# Patient Record
Sex: Female | Born: 1950 | Race: Black or African American | Hispanic: No | Marital: Single | Smoking: Never smoker
Health system: Southern US, Community
[De-identification: ages and names within clinical notes are randomized; demographics above are authoritative.]

## PROBLEM LIST (undated history)

## (undated) ENCOUNTER — Emergency Department (HOSPITAL_COMMUNITY): Payer: Medicare Other | Source: Home / Self Care

## (undated) DIAGNOSIS — R350 Frequency of micturition: Secondary | ICD-10-CM

## (undated) DIAGNOSIS — M545 Low back pain, unspecified: Secondary | ICD-10-CM

## (undated) DIAGNOSIS — M2550 Pain in unspecified joint: Secondary | ICD-10-CM

## (undated) DIAGNOSIS — Z9221 Personal history of antineoplastic chemotherapy: Secondary | ICD-10-CM

## (undated) DIAGNOSIS — D649 Anemia, unspecified: Secondary | ICD-10-CM

## (undated) DIAGNOSIS — C73 Malignant neoplasm of thyroid gland: Secondary | ICD-10-CM

## (undated) DIAGNOSIS — I10 Essential (primary) hypertension: Secondary | ICD-10-CM

## (undated) DIAGNOSIS — R238 Other skin changes: Secondary | ICD-10-CM

## (undated) DIAGNOSIS — C50911 Malignant neoplasm of unspecified site of right female breast: Secondary | ICD-10-CM

## (undated) DIAGNOSIS — Z8709 Personal history of other diseases of the respiratory system: Secondary | ICD-10-CM

## (undated) DIAGNOSIS — Z1379 Encounter for other screening for genetic and chromosomal anomalies: Secondary | ICD-10-CM

## (undated) DIAGNOSIS — M1990 Unspecified osteoarthritis, unspecified site: Secondary | ICD-10-CM

## (undated) DIAGNOSIS — J189 Pneumonia, unspecified organism: Secondary | ICD-10-CM

## (undated) DIAGNOSIS — Z923 Personal history of irradiation: Secondary | ICD-10-CM

## (undated) DIAGNOSIS — G8929 Other chronic pain: Secondary | ICD-10-CM

## (undated) DIAGNOSIS — D0512 Intraductal carcinoma in situ of left breast: Secondary | ICD-10-CM

## (undated) DIAGNOSIS — M778 Other enthesopathies, not elsewhere classified: Secondary | ICD-10-CM

## (undated) DIAGNOSIS — E039 Hypothyroidism, unspecified: Secondary | ICD-10-CM

## (undated) DIAGNOSIS — M2540 Effusion, unspecified joint: Secondary | ICD-10-CM

## (undated) DIAGNOSIS — R233 Spontaneous ecchymoses: Secondary | ICD-10-CM

## (undated) DIAGNOSIS — G4730 Sleep apnea, unspecified: Secondary | ICD-10-CM

## (undated) HISTORY — DX: Encounter for other screening for genetic and chromosomal anomalies: Z13.79

## (undated) HISTORY — PX: JOINT REPLACEMENT: SHX530

## (undated) HISTORY — PX: COLONOSCOPY: SHX174

## (undated) HISTORY — DX: Malignant neoplasm of thyroid gland: C73

---

## 1997-12-11 ENCOUNTER — Other Ambulatory Visit: Admission: RE | Admit: 1997-12-11 | Discharge: 1997-12-11 | Payer: Self-pay | Admitting: Obstetrics and Gynecology

## 1998-09-10 ENCOUNTER — Encounter: Payer: Self-pay | Admitting: Oral and Maxillofacial Surgery

## 1998-09-12 ENCOUNTER — Observation Stay (HOSPITAL_COMMUNITY): Admission: RE | Admit: 1998-09-12 | Discharge: 1998-09-13 | Payer: Self-pay | Admitting: Oral and Maxillofacial Surgery

## 1999-05-21 ENCOUNTER — Other Ambulatory Visit: Admission: RE | Admit: 1999-05-21 | Discharge: 1999-05-21 | Payer: Self-pay | Admitting: Family Medicine

## 1999-07-03 ENCOUNTER — Other Ambulatory Visit: Admission: RE | Admit: 1999-07-03 | Discharge: 1999-07-03 | Payer: Self-pay | Admitting: Family Medicine

## 2001-10-06 ENCOUNTER — Encounter: Payer: Self-pay | Admitting: Family Medicine

## 2001-10-06 ENCOUNTER — Ambulatory Visit (HOSPITAL_COMMUNITY): Admission: RE | Admit: 2001-10-06 | Discharge: 2001-10-06 | Payer: Self-pay | Admitting: Family Medicine

## 2001-11-15 ENCOUNTER — Encounter: Payer: Self-pay | Admitting: Obstetrics

## 2001-11-15 ENCOUNTER — Ambulatory Visit (HOSPITAL_COMMUNITY): Admission: RE | Admit: 2001-11-15 | Discharge: 2001-11-15 | Payer: Self-pay | Admitting: Obstetrics

## 2002-04-28 DIAGNOSIS — C50911 Malignant neoplasm of unspecified site of right female breast: Secondary | ICD-10-CM

## 2002-04-28 HISTORY — PX: BREAST LUMPECTOMY: SHX2

## 2002-04-28 HISTORY — PX: PORTACATH PLACEMENT: SHX2246

## 2002-04-28 HISTORY — DX: Malignant neoplasm of unspecified site of right female breast: C50.911

## 2003-02-28 ENCOUNTER — Encounter: Admission: RE | Admit: 2003-02-28 | Discharge: 2003-02-28 | Payer: Self-pay | Admitting: Family Medicine

## 2003-02-28 ENCOUNTER — Encounter (INDEPENDENT_AMBULATORY_CARE_PROVIDER_SITE_OTHER): Payer: Self-pay | Admitting: Diagnostic Radiology

## 2003-02-28 ENCOUNTER — Encounter (INDEPENDENT_AMBULATORY_CARE_PROVIDER_SITE_OTHER): Payer: Self-pay | Admitting: *Deleted

## 2003-02-28 HISTORY — PX: BREAST BIOPSY: SHX20

## 2003-03-13 ENCOUNTER — Encounter (HOSPITAL_COMMUNITY): Admission: RE | Admit: 2003-03-13 | Discharge: 2003-06-11 | Payer: Self-pay | Admitting: Internal Medicine

## 2003-03-17 ENCOUNTER — Encounter (INDEPENDENT_AMBULATORY_CARE_PROVIDER_SITE_OTHER): Payer: Self-pay | Admitting: Specialist

## 2003-03-17 ENCOUNTER — Ambulatory Visit (HOSPITAL_COMMUNITY): Admission: RE | Admit: 2003-03-17 | Discharge: 2003-03-17 | Payer: Self-pay | Admitting: General Surgery

## 2003-04-18 ENCOUNTER — Ambulatory Visit: Admission: RE | Admit: 2003-04-18 | Discharge: 2003-04-18 | Payer: Self-pay | Admitting: Oncology

## 2003-04-18 ENCOUNTER — Encounter (INDEPENDENT_AMBULATORY_CARE_PROVIDER_SITE_OTHER): Payer: Self-pay | Admitting: Cardiology

## 2003-04-24 ENCOUNTER — Ambulatory Visit (HOSPITAL_COMMUNITY): Admission: RE | Admit: 2003-04-24 | Discharge: 2003-04-24 | Payer: Self-pay | Admitting: Oncology

## 2003-04-27 ENCOUNTER — Ambulatory Visit (HOSPITAL_COMMUNITY): Admission: RE | Admit: 2003-04-27 | Discharge: 2003-04-27 | Payer: Self-pay | Admitting: General Surgery

## 2003-04-29 DIAGNOSIS — Z923 Personal history of irradiation: Secondary | ICD-10-CM

## 2003-04-29 DIAGNOSIS — Z9221 Personal history of antineoplastic chemotherapy: Secondary | ICD-10-CM

## 2003-04-29 HISTORY — PX: PORTA CATH REMOVAL: CATH118286

## 2003-04-29 HISTORY — DX: Personal history of irradiation: Z92.3

## 2003-04-29 HISTORY — DX: Personal history of antineoplastic chemotherapy: Z92.21

## 2003-05-04 ENCOUNTER — Ambulatory Visit (HOSPITAL_COMMUNITY): Admission: RE | Admit: 2003-05-04 | Discharge: 2003-05-04 | Payer: Self-pay | Admitting: Oncology

## 2003-07-24 ENCOUNTER — Ambulatory Visit (HOSPITAL_COMMUNITY): Admission: RE | Admit: 2003-07-24 | Discharge: 2003-07-24 | Payer: Self-pay | Admitting: Oncology

## 2003-08-07 ENCOUNTER — Ambulatory Visit (HOSPITAL_COMMUNITY): Admission: RE | Admit: 2003-08-07 | Discharge: 2003-08-07 | Payer: Self-pay | Admitting: Oncology

## 2003-08-16 ENCOUNTER — Ambulatory Visit: Admission: RE | Admit: 2003-08-16 | Discharge: 2003-10-27 | Payer: Self-pay | Admitting: Radiation Oncology

## 2003-08-21 ENCOUNTER — Encounter: Admission: RE | Admit: 2003-08-21 | Discharge: 2003-08-21 | Payer: Self-pay | Admitting: Radiation Oncology

## 2003-09-21 ENCOUNTER — Ambulatory Visit (HOSPITAL_BASED_OUTPATIENT_CLINIC_OR_DEPARTMENT_OTHER): Admission: RE | Admit: 2003-09-21 | Discharge: 2003-09-21 | Payer: Self-pay | Admitting: General Surgery

## 2003-09-21 ENCOUNTER — Ambulatory Visit (HOSPITAL_COMMUNITY): Admission: RE | Admit: 2003-09-21 | Discharge: 2003-09-21 | Payer: Self-pay | Admitting: General Surgery

## 2003-11-23 ENCOUNTER — Ambulatory Visit: Admission: RE | Admit: 2003-11-23 | Discharge: 2003-11-23 | Payer: Self-pay | Admitting: Radiation Oncology

## 2004-04-02 ENCOUNTER — Encounter: Admission: RE | Admit: 2004-04-02 | Discharge: 2004-04-02 | Payer: Self-pay | Admitting: Oncology

## 2004-05-03 ENCOUNTER — Ambulatory Visit: Payer: Self-pay | Admitting: Oncology

## 2004-05-23 ENCOUNTER — Ambulatory Visit: Admission: RE | Admit: 2004-05-23 | Discharge: 2004-05-23 | Payer: Self-pay | Admitting: Radiation Oncology

## 2004-05-29 ENCOUNTER — Ambulatory Visit: Admission: RE | Admit: 2004-05-29 | Discharge: 2004-08-27 | Payer: Self-pay | Admitting: Radiation Oncology

## 2004-12-09 ENCOUNTER — Ambulatory Visit: Payer: Self-pay | Admitting: Oncology

## 2005-04-28 HISTORY — PX: KNEE ARTHROSCOPY: SHX127

## 2005-04-28 HISTORY — PX: APPENDECTOMY: SHX54

## 2005-04-29 ENCOUNTER — Inpatient Hospital Stay (HOSPITAL_COMMUNITY): Admission: EM | Admit: 2005-04-29 | Discharge: 2005-05-06 | Payer: Self-pay | Admitting: Emergency Medicine

## 2005-04-29 ENCOUNTER — Encounter (INDEPENDENT_AMBULATORY_CARE_PROVIDER_SITE_OTHER): Payer: Self-pay | Admitting: Specialist

## 2005-05-21 ENCOUNTER — Encounter: Admission: RE | Admit: 2005-05-21 | Discharge: 2005-05-21 | Payer: Self-pay | Admitting: Surgery

## 2005-06-30 ENCOUNTER — Encounter: Admission: RE | Admit: 2005-06-30 | Discharge: 2005-06-30 | Payer: Self-pay | Admitting: Oncology

## 2005-07-14 ENCOUNTER — Encounter: Admission: RE | Admit: 2005-07-14 | Discharge: 2005-07-14 | Payer: Self-pay | Admitting: Oncology

## 2005-07-21 ENCOUNTER — Encounter: Admission: RE | Admit: 2005-07-21 | Discharge: 2005-07-21 | Payer: Self-pay | Admitting: Oncology

## 2005-09-12 ENCOUNTER — Ambulatory Visit (HOSPITAL_COMMUNITY): Admission: RE | Admit: 2005-09-12 | Discharge: 2005-09-12 | Payer: Self-pay | Admitting: Orthopedic Surgery

## 2005-10-01 ENCOUNTER — Encounter: Admission: RE | Admit: 2005-10-01 | Discharge: 2005-10-16 | Payer: Self-pay | Admitting: Orthopedic Surgery

## 2005-10-31 ENCOUNTER — Ambulatory Visit: Payer: Self-pay | Admitting: Oncology

## 2005-11-07 LAB — CBC WITH DIFFERENTIAL/PLATELET
Eosinophils Absolute: 0.1 10*3/uL (ref 0.0–0.5)
MONO#: 0.3 10*3/uL (ref 0.1–0.9)
NEUT#: 2.7 10*3/uL (ref 1.5–6.5)
RBC: 4.1 10*6/uL (ref 3.70–5.32)
RDW: 12.6 % (ref 11.3–14.5)
WBC: 4.8 10*3/uL (ref 3.9–10.0)

## 2005-11-17 LAB — COMPREHENSIVE METABOLIC PANEL
ALT: 17 U/L (ref 0–40)
AST: 16 U/L (ref 0–37)
CO2: 28 mEq/L (ref 19–32)
Calcium: 8.8 mg/dL (ref 8.4–10.5)
Chloride: 107 mEq/L (ref 96–112)
Sodium: 143 mEq/L (ref 135–145)
Total Bilirubin: 0.3 mg/dL (ref 0.3–1.2)
Total Protein: 6.7 g/dL (ref 6.0–8.3)

## 2005-11-17 LAB — ESTRADIOL, ULTRA SENS: Estradiol, Ultra Sensitive: 25 pg/mL

## 2005-11-17 LAB — CANCER ANTIGEN 27.29: CA 27.29: 16 U/mL (ref 0–39)

## 2006-01-22 ENCOUNTER — Encounter: Admission: RE | Admit: 2006-01-22 | Discharge: 2006-01-22 | Payer: Self-pay | Admitting: Oncology

## 2006-04-28 HISTORY — PX: ANTERIOR CERVICAL DECOMP/DISCECTOMY FUSION: SHX1161

## 2006-05-05 ENCOUNTER — Ambulatory Visit: Payer: Self-pay | Admitting: Oncology

## 2006-05-07 LAB — CBC WITH DIFFERENTIAL/PLATELET
Basophils Absolute: 0 10*3/uL (ref 0.0–0.1)
EOS%: 1.5 % (ref 0.0–7.0)
HGB: 12.6 g/dL (ref 11.6–15.9)
LYMPH%: 38.4 % (ref 14.0–48.0)
MCH: 29.8 pg (ref 26.0–34.0)
MCV: 88.2 fL (ref 81.0–101.0)
MONO%: 7 % (ref 0.0–13.0)
Platelets: 279 10*3/uL (ref 145–400)
RDW: 12.3 % (ref 11.3–14.5)

## 2006-05-07 LAB — COMPREHENSIVE METABOLIC PANEL
AST: 17 U/L (ref 0–37)
Alkaline Phosphatase: 52 U/L (ref 39–117)
BUN: 18 mg/dL (ref 6–23)
Creatinine, Ser: 0.96 mg/dL (ref 0.40–1.20)
Glucose, Bld: 102 mg/dL — ABNORMAL HIGH (ref 70–99)
Potassium: 3.5 mEq/L (ref 3.5–5.3)
Total Bilirubin: 0.3 mg/dL (ref 0.3–1.2)

## 2006-05-07 LAB — FOLLICLE STIMULATING HORMONE: FSH: 11.8 m[IU]/mL

## 2006-07-31 LAB — ESTRADIOL, ULTRA SENS

## 2006-08-10 ENCOUNTER — Encounter: Admission: RE | Admit: 2006-08-10 | Discharge: 2006-08-10 | Payer: Self-pay | Admitting: Oncology

## 2006-11-13 ENCOUNTER — Ambulatory Visit: Payer: Self-pay | Admitting: Oncology

## 2006-11-18 LAB — FOLLICLE STIMULATING HORMONE: FSH: 11.7 m[IU]/mL

## 2006-11-18 LAB — COMPREHENSIVE METABOLIC PANEL
ALT: 23 U/L (ref 0–35)
AST: 21 U/L (ref 0–37)
Albumin: 3.9 g/dL (ref 3.5–5.2)
BUN: 15 mg/dL (ref 6–23)
CO2: 26 mEq/L (ref 19–32)
Calcium: 8.8 mg/dL (ref 8.4–10.5)
Chloride: 110 mEq/L (ref 96–112)
Creatinine, Ser: 0.95 mg/dL (ref 0.40–1.20)
Potassium: 3.8 mEq/L (ref 3.5–5.3)

## 2006-11-18 LAB — CANCER ANTIGEN 27.29: CA 27.29: 16 U/mL (ref 0–39)

## 2006-11-18 LAB — CBC WITH DIFFERENTIAL/PLATELET
Basophils Absolute: 0 10*3/uL (ref 0.0–0.1)
EOS%: 2 % (ref 0.0–7.0)
HCT: 35.5 % (ref 34.8–46.6)
HGB: 12.2 g/dL (ref 11.6–15.9)
MONO#: 0.4 10*3/uL (ref 0.1–0.9)
NEUT#: 2.3 10*3/uL (ref 1.5–6.5)
NEUT%: 45.3 % (ref 39.6–76.8)
RDW: 12.3 % (ref 11.3–14.5)
WBC: 5.1 10*3/uL (ref 3.9–10.0)
lymph#: 2.3 10*3/uL (ref 0.9–3.3)

## 2006-11-30 LAB — ESTRADIOL, ULTRA SENS: Estradiol, Ultra Sensitive: <2 pg/mL

## 2007-01-04 ENCOUNTER — Inpatient Hospital Stay (HOSPITAL_COMMUNITY): Admission: RE | Admit: 2007-01-04 | Discharge: 2007-01-05 | Payer: Self-pay | Admitting: Neurosurgery

## 2007-05-19 ENCOUNTER — Ambulatory Visit: Payer: Self-pay | Admitting: Oncology

## 2007-05-21 ENCOUNTER — Encounter: Admission: RE | Admit: 2007-05-21 | Discharge: 2007-05-21 | Payer: Self-pay | Admitting: Oncology

## 2007-05-21 LAB — CBC WITH DIFFERENTIAL/PLATELET
BASO%: 0 % (ref 0.0–2.0)
EOS%: 1.5 % (ref 0.0–7.0)
HCT: 38 % (ref 34.8–46.6)
LYMPH%: 39.8 % (ref 14.0–48.0)
MCH: 28.6 pg (ref 26.0–34.0)
MCHC: 33.4 g/dL (ref 32.0–36.0)
MONO#: 0.4 10*3/uL (ref 0.1–0.9)
NEUT%: 51.7 % (ref 39.6–76.8)
Platelets: 308 10*3/uL (ref 145–400)
RBC: 4.43 10*6/uL (ref 3.70–5.32)
WBC: 5.6 10*3/uL (ref 3.9–10.0)

## 2007-05-21 LAB — COMPREHENSIVE METABOLIC PANEL
ALT: 19 U/L (ref 0–35)
AST: 17 U/L (ref 0–37)
Alkaline Phosphatase: 56 U/L (ref 39–117)
Creatinine, Ser: 1.05 mg/dL (ref 0.40–1.20)
Sodium: 141 mEq/L (ref 135–145)
Total Bilirubin: 0.4 mg/dL (ref 0.3–1.2)
Total Protein: 7.2 g/dL (ref 6.0–8.3)

## 2007-05-21 LAB — CANCER ANTIGEN 27.29: CA 27.29: 18 U/mL (ref 0–39)

## 2007-06-03 LAB — ESTRADIOL, ULTRA SENS: Estradiol, Ultra Sensitive: <2 pg/mL

## 2007-08-20 ENCOUNTER — Encounter: Admission: RE | Admit: 2007-08-20 | Discharge: 2007-08-20 | Payer: Self-pay | Admitting: Oncology

## 2007-11-16 ENCOUNTER — Ambulatory Visit: Payer: Self-pay | Admitting: Oncology

## 2007-11-19 LAB — COMPREHENSIVE METABOLIC PANEL
Alkaline Phosphatase: 54 U/L (ref 39–117)
BUN: 16 mg/dL (ref 6–23)
Glucose, Bld: 92 mg/dL (ref 70–99)
Sodium: 139 mEq/L (ref 135–145)
Total Bilirubin: 0.3 mg/dL (ref 0.3–1.2)

## 2007-11-19 LAB — CBC WITH DIFFERENTIAL/PLATELET
Basophils Absolute: 0 10*3/uL (ref 0.0–0.1)
Eosinophils Absolute: 0.1 10*3/uL (ref 0.0–0.5)
LYMPH%: 42.7 % (ref 14.0–48.0)
MCV: 86.2 fL (ref 81.0–101.0)
MONO%: 6.2 % (ref 0.0–13.0)
NEUT#: 3.5 10*3/uL (ref 1.5–6.5)
Platelets: 294 10*3/uL (ref 145–400)
RBC: 3.98 10*6/uL (ref 3.70–5.32)

## 2007-11-19 LAB — FOLLICLE STIMULATING HORMONE: FSH: 11.9 m[IU]/mL

## 2007-11-30 LAB — ESTRADIOL, ULTRA SENS

## 2008-05-17 ENCOUNTER — Ambulatory Visit: Payer: Self-pay | Admitting: Oncology

## 2008-09-06 ENCOUNTER — Encounter: Admission: RE | Admit: 2008-09-06 | Discharge: 2008-09-06 | Payer: Self-pay | Admitting: Oncology

## 2008-11-13 ENCOUNTER — Ambulatory Visit: Payer: Self-pay | Admitting: Oncology

## 2008-11-15 LAB — CBC WITH DIFFERENTIAL/PLATELET
BASO%: 0.6 % (ref 0.0–2.0)
EOS%: 1.4 % (ref 0.0–7.0)
MCH: 30 pg (ref 25.1–34.0)
MCHC: 33.9 g/dL (ref 31.5–36.0)
MCV: 88.5 fL (ref 79.5–101.0)
MONO%: 6.4 % (ref 0.0–14.0)
RBC: 3.96 10*6/uL (ref 3.70–5.45)
RDW: 12.7 % (ref 11.2–14.5)

## 2008-11-15 LAB — COMPREHENSIVE METABOLIC PANEL
ALT: 26 U/L (ref 0–35)
AST: 15 U/L (ref 0–37)
Albumin: 3.7 g/dL (ref 3.5–5.2)
Alkaline Phosphatase: 46 U/L (ref 39–117)
BUN: 15 mg/dL (ref 6–23)
Potassium: 3.5 mEq/L (ref 3.5–5.3)
Sodium: 141 mEq/L (ref 135–145)
Total Protein: 6.7 g/dL (ref 6.0–8.3)

## 2009-05-28 ENCOUNTER — Ambulatory Visit: Payer: Self-pay | Admitting: Oncology

## 2009-05-30 LAB — CBC WITH DIFFERENTIAL/PLATELET
Basophils Absolute: 0 10*3/uL (ref 0.0–0.1)
EOS%: 1.1 % (ref 0.0–7.0)
HCT: 36.8 % (ref 34.8–46.6)
LYMPH%: 45.5 % (ref 14.0–49.7)
MCH: 30 pg (ref 25.1–34.0)
MCHC: 33.2 g/dL (ref 31.5–36.0)
MONO#: 0.3 10*3/uL (ref 0.1–0.9)
Platelets: 273 10*3/uL (ref 145–400)
RBC: 4.08 10*6/uL (ref 3.70–5.45)
WBC: 5.5 10*3/uL (ref 3.9–10.3)

## 2009-05-30 LAB — COMPREHENSIVE METABOLIC PANEL
AST: 21 U/L (ref 0–37)
Albumin: 4 g/dL (ref 3.5–5.2)
BUN: 18 mg/dL (ref 6–23)
CO2: 25 mEq/L (ref 19–32)
Chloride: 104 mEq/L (ref 96–112)
Creatinine, Ser: 0.83 mg/dL (ref 0.40–1.20)
Glucose, Bld: 106 mg/dL — ABNORMAL HIGH (ref 70–99)
Sodium: 141 mEq/L (ref 135–145)

## 2009-05-31 ENCOUNTER — Ambulatory Visit (HOSPITAL_COMMUNITY): Admission: RE | Admit: 2009-05-31 | Discharge: 2009-05-31 | Payer: Self-pay | Admitting: Oncology

## 2009-05-31 ENCOUNTER — Ambulatory Visit: Payer: Self-pay | Admitting: Internal Medicine

## 2009-05-31 ENCOUNTER — Ambulatory Visit: Payer: Self-pay

## 2009-09-11 ENCOUNTER — Encounter: Admission: RE | Admit: 2009-09-11 | Discharge: 2009-09-11 | Payer: Self-pay | Admitting: Oncology

## 2009-10-10 ENCOUNTER — Encounter: Admission: RE | Admit: 2009-10-10 | Discharge: 2009-10-10 | Payer: Self-pay | Admitting: Family Medicine

## 2010-01-16 ENCOUNTER — Encounter: Admission: RE | Admit: 2010-01-16 | Discharge: 2010-01-24 | Payer: Self-pay | Admitting: Family Medicine

## 2010-04-28 DIAGNOSIS — C73 Malignant neoplasm of thyroid gland: Secondary | ICD-10-CM

## 2010-04-28 HISTORY — DX: Malignant neoplasm of thyroid gland: C73

## 2010-05-18 ENCOUNTER — Encounter: Payer: Self-pay | Admitting: Oncology

## 2010-05-19 ENCOUNTER — Encounter: Payer: Self-pay | Admitting: Oncology

## 2010-06-04 ENCOUNTER — Other Ambulatory Visit: Payer: Self-pay | Admitting: Physician Assistant

## 2010-06-04 ENCOUNTER — Encounter (HOSPITAL_BASED_OUTPATIENT_CLINIC_OR_DEPARTMENT_OTHER): Payer: 59 | Admitting: Oncology

## 2010-06-04 DIAGNOSIS — Z170 Estrogen receptor positive status [ER+]: Secondary | ICD-10-CM

## 2010-06-04 DIAGNOSIS — C50219 Malignant neoplasm of upper-inner quadrant of unspecified female breast: Secondary | ICD-10-CM

## 2010-06-04 LAB — COMPREHENSIVE METABOLIC PANEL
BUN: 18 mg/dL (ref 6–23)
CO2: 28 mEq/L (ref 19–32)
Chloride: 102 mEq/L (ref 96–112)
Potassium: 3.3 mEq/L — ABNORMAL LOW (ref 3.5–5.3)
Total Bilirubin: 0.4 mg/dL (ref 0.3–1.2)

## 2010-06-04 LAB — CBC WITH DIFFERENTIAL/PLATELET
Basophils Absolute: 0 10*3/uL (ref 0.0–0.1)
LYMPH%: 36.8 % (ref 14.0–49.7)
MCH: 30.1 pg (ref 25.1–34.0)
MCHC: 34.1 g/dL (ref 31.5–36.0)
MCV: 88.4 fL (ref 79.5–101.0)
MONO#: 0.3 10*3/uL (ref 0.1–0.9)
NEUT%: 55.9 % (ref 38.4–76.8)
RBC: 4.05 10*6/uL (ref 3.70–5.45)

## 2010-06-04 LAB — CANCER ANTIGEN 27.29: CA 27.29: 19 U/mL (ref 0–39)

## 2010-06-12 ENCOUNTER — Encounter (HOSPITAL_BASED_OUTPATIENT_CLINIC_OR_DEPARTMENT_OTHER): Payer: 59 | Admitting: Oncology

## 2010-06-12 ENCOUNTER — Other Ambulatory Visit: Payer: Self-pay | Admitting: Oncology

## 2010-06-12 DIAGNOSIS — Z170 Estrogen receptor positive status [ER+]: Secondary | ICD-10-CM

## 2010-06-12 DIAGNOSIS — C50219 Malignant neoplasm of upper-inner quadrant of unspecified female breast: Secondary | ICD-10-CM

## 2010-06-12 DIAGNOSIS — Z9889 Other specified postprocedural states: Secondary | ICD-10-CM

## 2010-09-10 NOTE — Op Note (Signed)
[name], [name] NO.:  [id number]   MEDICAL RECORD NO.:  [id number]          PATIENT TYPE:  INP   LOCATION:  2861                         FACILITY:  [facility]   PHYSICIAN:  Hewitt Shorts, M.D.DATE OF BIRTH:  June 28, 1950   DATE OF PROCEDURE:  01/04/2007  DATE OF DISCHARGE:                               OPERATIVE REPORT   PREOPERATIVE DIAGNOSIS:  Cervical disc herniation, cervical spondylosis,  cervical degenerative disc disease and cervical radiculopathy.   POSTOPERATIVE DIAGNOSIS:  Cervical disc herniation, cervical  spondylosis, cervical degenerative disc disease and cervical  radiculopathy.   PROCEDURE:  C4-5, C5-6 and C6-7 anterior cervical decompression and  arthrodesis with allograft and tethered cervical plating.   SURGEON:  Hewitt Shorts, M.D.   ASSISTANTS:  Nelia Shi. Webb Silversmith, RN and Coletta Memos, M.D.   ANESTHESIA:  General endotracheal.   INDICATIONS:  A 60 year old woman who presented with neck and radicular  pain, found by x-ray and MRI scan to have advanced degenerative disc  disease and spondylosis with superimposed disc herniation.  A decision  was made to proceed with multilevel anterior cervical diskectomy and  arthrodesis.   DESCRIPTION OF PROCEDURE:  The patient was brought to the operating room  and placed under general endotracheal anesthesia.  The patient was  placed in 10 pounds of halter traction, and the neck was prepped with  Betadine soap solution and draped in a sterile fashion.  An incision was  made on the left side of the neck paralleling the anterior border of the  left sternocleidomastoid.  The line of the incision was infiltrated with  local anesthetic with epinephrine.  Bipolar cautery was used to maintain  hemostasis.  Dissection was carried down through the subcutaneous tissue  and platysma, and then dissection was carried out through an avascular  plane in the sternocleidomastoid, carotid artery and jugular  vein  laterally and the trachea and esophagus medially.  The ventral aspect of  the vertebral column was identified and localizing x-rays were taken.  The C4-5, C5-6 and C6-7 intervertebral disc spaces were identified.  Diskectomy was begun at each level with incision of the annulus.  There  were large ventral osteophytes at the C4-5 and C6-7 levels, and smaller  osteophytes at the C5-6.  All these were removed using osteophyte  removal tool, as well as the X-Max drill.  The microscope was draped and  brought onto the field to provide additional magnification, illumination  and visualization.  The remainder of the decompression was performed  using microdissection and microsurgical technique.  The diskectomy was  continued using microcurettes and pituitary rongeurs, and then the X-Max  drill and microcurettes were used to remove the cartilaginous endplates  of the vertebral body surfaces.  Dissection was carried out posteriorly  through the disk space.  There was significant posterior aspect  overgrowth at each level and this was removed using the X-Max drill  along with the 3 mm Kerrison punch with a thin footplate.  The posterior  longitudinal ligament was opened at each level and good decompression  was achieved to the spinal canal and  thecal sac at each level.   Then attention was turned to the neural foramina.  There was significant  osteophytic encroachment of the neural foramina bilaterally at each  level, and this was carefully removed decompressing the exiting C5, C6  and C7 nerve roots bilaterally.  Once the neural foramina and nerve  roots were decompressed, hemostasis was then established with the used  of Gelfoam soaked with thrombin.  We then selected interbody implants  measuring the heights of these vertebral disc spaces at 70 mm in height  implants.  Each of them was hydrated in saline solution and each of  these allograft implants was then carefully positioned in the   intervertebral disc space and counter sunk.  We then selected a 52 mm  tethered cervical plate.  It was positioned over the fusion, contoured  and secured to the vertebra with 4 mm variable angle screws placing  single 15 mm screws at C5 and C6, a pair of 15 mm screws at C4 and a  pair of 14 mm screws at C7.  Each of the screw holes was drilled with a  tap and the screws were placed in an alternating fashion.  Final  tightening was done of all six screws.   The wound was irrigated with bacitracin solution, checked for hemostasis  which was established and confirmed.  Then an x-ray was taken which  showed the graft, plate and screws in good position, the alignment was  good.  Then we proceeded with closure.  The platysma was closed with  interrupted undyed 2-0 Vicryl sutures.  The subcutaneous and  subcuticular were closed with interrupted inverted 3-0 running Vicryl  sutures and the skin was reapproximated with Dermabond.  The procedure  was tolerated well.  The estimated blood loss was 50 mL.  Sponge and  needle counts were correct.  Following surgery, the patient was placed  in an Aspen cervical collar to be reversed from the anesthetic,  extubated and transferred to the recovery room for further care.      Hewitt Shorts, M.D.  Electronically Signed     RWN/MEDQ  D:  01/04/2007  T:  01/04/2007  Job:  [id number]

## 2010-09-13 NOTE — Discharge Summary (Signed)
NAMEZAREEN, [name] NO.:  [id number]   MEDICAL RECORD NO.:  [id number]          PATIENT TYPE:  INP   LOCATION:  1326                         FACILITY:  [facility]   PHYSICIAN:  Wilmon Arms. Corliss Skains, M.D. DATE OF BIRTH:  1950-08-27   DATE OF ADMISSION:  04/29/2005  DATE OF DISCHARGE:  05/06/2005                                 DISCHARGE SUMMARY   ADMISSION DIAGNOSIS:  Acute perforated appendicitis.   DISCHARGE DIAGNOSIS:  Acute perforated appendicitis.   BRIEF HISTORY:  The patient is a 60 year old female who presented with a two  day history of gradually worsening right lower quadrant pain associated with  fever, nausea.  She was evaluated in [facility] emergency department and  underwent CT scan which showed a retrocecal appendix with findings  suspicious for perforation.  She was taken to the operating room emergently  for exploratory laparotomy via lower midline incision.  The patient had  gross perforation with free purulence throughout her lower abdomen.  She  underwent an appendectomy and thorough irrigation of her abdominal cavity.  Postoperatively, she had significant ileus as well as fever.  Her white  count slowly returned to normal.  On postoperative day number 4, she  developed a superficial wound infection which required removing several  staples and initiating wet-to-dry dressings.  Her white count then returned  to normal, and the patient remained afebrile.  She had a significant  diarrhea but tested negative for clostridium difficile toxin.  Her bowel  movements have subsequently returned towards normal.  On the day of  discharge, she is ambulating, eating a regular diet, and having regular  bowel movements.  She has been afebrile.  She was transitioned from  intravenous Zosyn to p.o. Augmentin.   DISCHARGE INSTRUCTIONS:  Medications:  Resume Benicar, tamoxifen,  multivitamin as before.  She has been given Percocet as well as Augmentin  875 p.o.  b.i.d. x 10 days.  The remainder of her staples were removed, and  she will perform daily wet-to-dry dressings at home.  She is to follow up  with Dr. Corliss Skains in two weeks.      Wilmon Arms. Tsuei, M.D.  Electronically Signed     MKT/MEDQ  D:  05/06/2005  T:  05/07/2005  Job:  [id number]

## 2010-09-13 NOTE — H&P (Signed)
[name], [name] NO.:  [id number]   MEDICAL RECORD NO.:  [id number]          PATIENT TYPE:  INP   LOCATION:  0098                         FACILITY:  [facility]   PHYSICIAN:  Wilmon Arms. Corliss Skains, M.D. DATE OF BIRTH:  October 13, 1950   DATE OF ADMISSION:  04/29/2005  DATE OF DISCHARGE:                                HISTORY & PHYSICAL   CHIEF COMPLAINT:  Lower abdominal pain, right lower quadrant.   HISTORY OF PRESENT ILLNESS:  The patient is a 60 year old female who  presents with a two-day history of gradually worsening right lower quadrant  pain. The patient reports some low-grade fever, nausea, and general malaise.  The pain became more severe today. She went to Urgent Care around lunch  time. At that time, she was afebrile and had a white count of 8.8.  However,  due to her physical examination she was referred to the [facility]  Emergency Department for further evaluation.   Here a CT scan was performed which showed a retrocecal appendix with fluid  suspicious for rupture with early abscess with surrounding free fluid in the  pelvis. White count was noted to be elevated at 11.4. The patient was also  noted to be febrile.   MEDICATIONS:  Benicar and tamoxifen.   ALLERGIES:  None.   PAST MEDICAL HISTORY:  1.  Right breast cancer, status post lumpectomy and radiation/chemotherapy.  2.  Hypertension.   PAST SURGICAL HISTORY:  1.  Cesarean section.  2.  Right lumpectomy.  3.  Porta-a-Cath insertion and porta-a-Cath removal by Dr. Lurene Shadow.   REVIEW OF SYSTEMS:  As noted above. Otherwise noncontributory.   PHYSICAL EXAMINATION:  VITAL SIGNS:  Temperature 100.8, blood pressure  131/78, pulse 117, respirations 22.  GENERAL:  This is an overweight African-American female in no apparent  distress.  HEENT:  EOMI. Sclerae anicteric.  NECK:  No masses. No thyromegaly.  LUNGS:  Clear to auscultation. Normal respiratory effort.  HEART:  Regular rate and  rhythm. No murmurs.  ABDOMEN:  Hypoactive bowel sounds. Tender in the right lower quadrant with  some guarding. Mild rebound. No rousting sign. There is a well-healed lower  midline incision.  EXTREMITIES:  Warm and dry.  SKIN:  No sign of jaundice.   LABORATORY DATA:  White count 11.4, hemoglobin 12.5, platelet count 255,000.  Electrolytes show sodium 139, potassium 3.3, chloride 105, bicarbonate 26,  glucose 153, BUN 16, creatinine 1.5.   CT scan showed fluid around the right liver edge with some right pericolic  fluid. There was some loculated free air in the right lower quadrant  associated with a ruptured retrocecal appendix with some surrounding fluid  and edema. Free fluid also extends in the pelvis.   IMPRESSION:  Perforated appendicitis.   PLAN:  We will proceed to the operating room for an open appendectomy. We  will perform this through a midline incision due to the patient's size and  the likelihood of having to mobilize the entire cecum. I explained the  benefits and risks of the procedure to the patient. She understands and  wishes to  proceed.      Wilmon Arms. Tsuei, M.D.  Electronically Signed     MKT/MEDQ  D:  04/30/2005  T:  04/30/2005  Job:  [id number]

## 2010-09-17 ENCOUNTER — Ambulatory Visit: Payer: 59

## 2010-09-18 ENCOUNTER — Ambulatory Visit
Admission: RE | Admit: 2010-09-18 | Discharge: 2010-09-18 | Disposition: A | Discharge status: Home / Self Care | Payer: 59 | Source: Ambulatory Visit | Attending: Oncology | Admitting: Oncology

## 2010-09-18 DIAGNOSIS — Z9889 Other specified postprocedural states: Secondary | ICD-10-CM

## 2011-02-07 LAB — CBC
HCT: 37.4
Hemoglobin: 12.6
MCHC: 33.8
MCV: 86.9
Platelets: 277
RBC: 4.3
RDW: 12.3
WBC: 7.3

## 2011-02-07 LAB — BASIC METABOLIC PANEL
BUN: 18
CO2: 30
Calcium: 9.1
Chloride: 100
Creatinine, Ser: 0.91
GFR calc Af Amer: >60
GFR calc non Af Amer: >60
Glucose, Bld: 78
Potassium: 3.8
Sodium: 136

## 2011-03-19 ENCOUNTER — Other Ambulatory Visit: Payer: Self-pay | Admitting: Otolaryngology

## 2011-04-04 ENCOUNTER — Other Ambulatory Visit (HOSPITAL_COMMUNITY)
Admission: RE | Admit: 2011-04-04 | Discharge: 2011-04-04 | Disposition: A | Discharge status: Home / Self Care | Payer: 59 | Source: Ambulatory Visit | Attending: Family Medicine | Admitting: Family Medicine

## 2011-04-04 ENCOUNTER — Other Ambulatory Visit: Payer: Self-pay | Admitting: Family Medicine

## 2011-04-04 DIAGNOSIS — Z01419 Encounter for gynecological examination (general) (routine) without abnormal findings: Secondary | ICD-10-CM | POA: Insufficient documentation

## 2011-04-04 DIAGNOSIS — Z1159 Encounter for screening for other viral diseases: Secondary | ICD-10-CM | POA: Insufficient documentation

## 2011-04-25 ENCOUNTER — Encounter (HOSPITAL_BASED_OUTPATIENT_CLINIC_OR_DEPARTMENT_OTHER): Payer: Self-pay | Admitting: *Deleted

## 2011-04-25 NOTE — Progress Notes (Signed)
To [facility] at 1100,Istat,Ekg on arrival.NPO after mn.

## 2011-05-02 ENCOUNTER — Encounter (HOSPITAL_BASED_OUTPATIENT_CLINIC_OR_DEPARTMENT_OTHER): Payer: Self-pay | Admitting: *Deleted

## 2011-05-02 ENCOUNTER — Encounter (HOSPITAL_BASED_OUTPATIENT_CLINIC_OR_DEPARTMENT_OTHER): Payer: Self-pay | Admitting: Anesthesiology

## 2011-05-02 ENCOUNTER — Ambulatory Visit (HOSPITAL_BASED_OUTPATIENT_CLINIC_OR_DEPARTMENT_OTHER)
Admission: RE | Admit: 2011-05-02 | Discharge: 2011-05-02 | Disposition: A | Discharge status: Home / Self Care | Payer: 59 | Source: Ambulatory Visit | Attending: Specialist | Admitting: Specialist

## 2011-05-02 ENCOUNTER — Ambulatory Visit (HOSPITAL_BASED_OUTPATIENT_CLINIC_OR_DEPARTMENT_OTHER): Payer: 59 | Admitting: Anesthesiology

## 2011-05-02 ENCOUNTER — Other Ambulatory Visit: Payer: Self-pay

## 2011-05-02 ENCOUNTER — Encounter (HOSPITAL_BASED_OUTPATIENT_CLINIC_OR_DEPARTMENT_OTHER)
Admission: RE | Disposition: A | Discharge status: Home / Self Care | Payer: Self-pay | Source: Ambulatory Visit | Attending: Specialist

## 2011-05-02 DIAGNOSIS — IMO0002 Reserved for concepts with insufficient information to code with codable children: Secondary | ICD-10-CM | POA: Insufficient documentation

## 2011-05-02 DIAGNOSIS — X58XXXA Exposure to other specified factors, initial encounter: Secondary | ICD-10-CM | POA: Insufficient documentation

## 2011-05-02 DIAGNOSIS — M1710 Unilateral primary osteoarthritis, unspecified knee: Secondary | ICD-10-CM | POA: Insufficient documentation

## 2011-05-02 DIAGNOSIS — Z79899 Other long term (current) drug therapy: Secondary | ICD-10-CM | POA: Insufficient documentation

## 2011-05-02 DIAGNOSIS — M2240 Chondromalacia patellae, unspecified knee: Secondary | ICD-10-CM | POA: Insufficient documentation

## 2011-05-02 DIAGNOSIS — I10 Essential (primary) hypertension: Secondary | ICD-10-CM | POA: Insufficient documentation

## 2011-05-02 HISTORY — PX: MENISCUS DEBRIDEMENT: SHX5178

## 2011-05-02 HISTORY — DX: Essential (primary) hypertension: I10

## 2011-05-02 HISTORY — PX: KNEE ARTHROSCOPY: SHX127

## 2011-05-02 LAB — POCT I-STAT 4, (NA,K, GLUC, HGB,HCT): Glucose, Bld: 96 mg/dL (ref 70–99)

## 2011-05-02 SURGERY — ARTHROSCOPY, KNEE
Anesthesia: General | Site: Knee | Laterality: Right | Wound class: Clean

## 2011-05-02 MED ORDER — ASPIRIN EC 325 MG PO TBEC: 325.0000 mg | DELAYED_RELEASE_TABLET | Freq: Every day | ORAL | Status: AC

## 2011-05-02 MED ORDER — LACTATED RINGERS IV SOLN
INTRAVENOUS | Status: DC
  Administered 2011-05-02 (×2): via INTRAVENOUS

## 2011-05-02 MED ORDER — LACTATED RINGERS IV SOLN: INTRAVENOUS | Status: DC

## 2011-05-02 MED ORDER — HYDROCODONE-ACETAMINOPHEN 5-325 MG PO TABS
1.0000 | ORAL_TABLET | ORAL | Status: DC | PRN
  Administered 2011-05-02: 1 via ORAL

## 2011-05-02 MED ORDER — CEFAZOLIN SODIUM-DEXTROSE 2-3 GM-% IV SOLR
2.0000 g | INTRAVENOUS | Status: AC
  Administered 2011-05-02: 2 g via INTRAVENOUS

## 2011-05-02 MED ORDER — PROMETHAZINE HCL 25 MG/ML IJ SOLN: 6.2500 mg | INTRAMUSCULAR | Status: DC | PRN

## 2011-05-02 MED ORDER — BUPIVACAINE-EPINEPHRINE 0.5% -1:200000 IJ SOLN
INTRAMUSCULAR | Status: DC | PRN
  Administered 2011-05-02: 20 mL

## 2011-05-02 MED ORDER — SODIUM CHLORIDE 0.9 % IR SOLN
Status: DC | PRN
  Administered 2011-05-02: 14:00:00

## 2011-05-02 MED ORDER — FENTANYL CITRATE 0.05 MG/ML IJ SOLN
INTRAMUSCULAR | Status: DC | PRN
  Administered 2011-05-02 (×5): 25 ug via INTRAVENOUS
  Administered 2011-05-02: 50 ug via INTRAVENOUS
  Administered 2011-05-02: 25 ug via INTRAVENOUS

## 2011-05-02 MED ORDER — HYDROCODONE-ACETAMINOPHEN 5-325 MG PO TABS: 1.0000 | ORAL_TABLET | ORAL | Status: AC | PRN

## 2011-05-02 MED ORDER — FENTANYL CITRATE 0.05 MG/ML IJ SOLN: 25.0000 ug | INTRAMUSCULAR | Status: DC | PRN

## 2011-05-02 MED ORDER — CHLORHEXIDINE GLUCONATE 4 % EX LIQD: 60.0000 mL | Freq: Once | CUTANEOUS | Status: DC

## 2011-05-02 MED ORDER — DEXAMETHASONE SODIUM PHOSPHATE 4 MG/ML IJ SOLN
INTRAMUSCULAR | Status: DC | PRN
  Administered 2011-05-02: 8 mg via INTRAVENOUS

## 2011-05-02 MED ORDER — MEPERIDINE HCL 25 MG/ML IJ SOLN: 6.2500 mg | INTRAMUSCULAR | Status: DC | PRN

## 2011-05-02 SURGICAL SUPPLY — 43 items
BANDAGE ELASTIC 6 VELCRO ST LF (GAUZE/BANDAGES/DRESSINGS) ×2 IMPLANT
BLADE 4.2CUDA (BLADE) IMPLANT
BLADE CUDA SHAVER 3.5 (BLADE) ×2 IMPLANT
BLADE GREAT WHITE 4.2 (BLADE) IMPLANT
BOOTIES KNEE HIGH SLOAN (MISCELLANEOUS) ×2 IMPLANT
CANISTER SUCT LVC 12 LTR MEDI- (MISCELLANEOUS) ×2 IMPLANT
CANISTER SUCTION 1200CC (MISCELLANEOUS) IMPLANT
CANISTER SUCTION 2500CC (MISCELLANEOUS) IMPLANT
CANNULA ACUFLEX KIT 5X76 (CANNULA) IMPLANT
CLOTH BEACON ORANGE TIMEOUT ST (SAFETY) ×2 IMPLANT
CUTTER MENISCUS  4.2MM (BLADE)
CUTTER MENISCUS 4.2MM (BLADE) IMPLANT
DRAPE ARTHROSCOPY W/POUCH 114 (DRAPES) ×2 IMPLANT
DRSG PAD ABDOMINAL 8X10 ST (GAUZE/BANDAGES/DRESSINGS) ×1 IMPLANT
DURAPREP 26ML APPLICATOR (WOUND CARE) ×2 IMPLANT
ELECT REM PT RETURN 9FT ADLT (ELECTROSURGICAL)
ELECTRODE REM PT RTRN 9FT ADLT (ELECTROSURGICAL) IMPLANT
GAUZE SPONGE 4X4 12PLY STRL LF (GAUZE/BANDAGES/DRESSINGS) ×1 IMPLANT
GLOVE BIOGEL M 6.5 STRL (GLOVE) ×1 IMPLANT
GLOVE ECLIPSE 6.0 STRL STRAW (GLOVE) ×1 IMPLANT
GLOVE SURG SS PI 8.0 STRL IVOR (GLOVE) ×2 IMPLANT
GOWN PREVENTION PLUS LG XLONG (DISPOSABLE) ×2 IMPLANT
GOWN STRL REIN XL XLG (GOWN DISPOSABLE) ×2 IMPLANT
IV NS IRRIG 3000ML ARTHROMATIC (IV SOLUTION) ×4 IMPLANT
KNEE WRAP E Z 3 GEL PACK (MISCELLANEOUS) ×2 IMPLANT
MINI VAC (SURGICAL WAND) IMPLANT
NDL FILTER BLUNT 18X1 1/2 (NEEDLE) ×1 IMPLANT
NDL SAFETY ECLIPSE 18X1.5 (NEEDLE) ×2 IMPLANT
NEEDLE FILTER BLUNT 18X 1/2SAF (NEEDLE) ×1
NEEDLE FILTER BLUNT 18X1 1/2 (NEEDLE) ×1 IMPLANT
NEEDLE HYPO 18GX1.5 SHARP (NEEDLE) ×4
PACK ARTHROSCOPY DSU (CUSTOM PROCEDURE TRAY) ×2 IMPLANT
PACK BASIN DAY SURGERY FS (CUSTOM PROCEDURE TRAY) ×2 IMPLANT
PADDING CAST COTTON 6X4 STRL (CAST SUPPLIES) ×2 IMPLANT
SET ARTHROSCOPY TUBING (MISCELLANEOUS) ×2
SET ARTHROSCOPY TUBING LN (MISCELLANEOUS) ×1 IMPLANT
SPONGE GAUZE 4X4 12PLY (GAUZE/BANDAGES/DRESSINGS) ×2 IMPLANT
SUT ETHILON 4 0 PS 2 18 (SUTURE) ×2 IMPLANT
SYR 30ML LL (SYRINGE) ×2 IMPLANT
SYRINGE 10CC LL (SYRINGE) ×1 IMPLANT
TOWEL OR 17X24 6PK STRL BLUE (TOWEL DISPOSABLE) ×2 IMPLANT
WAND 90 DEG TURBOVAC W/CORD (SURGICAL WAND) IMPLANT
WATER STERILE IRR 500ML POUR (IV SOLUTION) ×2 IMPLANT

## 2011-05-02 NOTE — H&P (Signed)
[name] is an 61 y.o. female.   Chief Complaint: right knee pain  HPI: meniscus tear right knee  Past Medical History  Diagnosis Date  . Hypertension   . Cancer 2004    rt breast    Past Surgical History  Procedure Date  . Breast lumpectomy 2004  . Portacath placement 2004  . Appendectomy 2007  . Knee arthroscopy 2007  . Anterior cervical decomp/discectomy fusion 2008  . Porta cath remove 2005    History reviewed. No pertinent family history. Social History:  reports that she has never smoked. She does not have any smokeless tobacco history on file. She reports that she drinks alcohol. Her drug history not on file.  Allergies: No Known Allergies  Medications Prior to Admission  Medication Dose Route Frequency Provider Last Rate Last Dose  . ceFAZolin (ANCEF) IVPB 2 g/50 mL premix  2 g Intravenous 60 min Pre-Op Liam Graham, PA      . chlorhexidine (HIBICLENS) 4 % liquid 4 application  60 mL Topical Once Liam Graham, PA      . lactated ringers infusion   Intravenous Continuous Gaetano Hawthorne, MD      . lactated ringers infusion   Intravenous Continuous Liam Graham, PA       Medications Prior to Admission  Medication Sig Dispense Refill  . Olmesartan-Amlodipine-HCTZ (TRIBENZOR) 20-5-12.5 MG TABS Take by mouth.          Results for orders placed during the hospital encounter of 05/02/11 (from the past 48 hour(s))  POCT I-STAT 4, (NA,K, GLUC, HGB,HCT)     Status: Abnormal   Collection Time   05/02/11 12:07 PM      Component Value Range Comment   Sodium 145  135 - 145 (mEq/L)    Potassium 3.3 (*) 3.5 - 5.1 (mEq/L)    Glucose, Bld 96  70 - 99 (mg/dL)    HCT 16.1  09.6 - 04.5 (%)    Hemoglobin 12.9  12.0 - 15.0 (g/dL)    No results found.  Review of Systems  Constitutional: Negative.   HENT: Negative.   Eyes: Negative.   Respiratory: Negative.   Cardiovascular: Negative.   Gastrointestinal: Negative.   Genitourinary: Negative.     Musculoskeletal: Negative.   Skin: Negative.   Neurological: Negative.   Endo/Heme/Allergies: Negative.   Psychiatric/Behavioral: Negative.     Blood pressure 129/80, pulse 83, temperature 97.1 F (36.2 C), temperature source Oral, resp. rate 18, height 5\' 4"  (1.626 m), weight 104.327 kg (230 lb), SpO2 99.00%. Physical Exam  Constitutional: She appears well-developed.  HENT:  Head: Normocephalic.  Eyes: Pupils are equal, round, and reactive to light.  Neck: Normal range of motion.  Cardiovascular: Normal rate.   Respiratory: Effort normal.  GI: Soft.  Musculoskeletal: She exhibits edema and tenderness.       +MJLT right knee  Neurological: She is alert.  Skin: Skin is warm.  Psychiatric: She has a normal mood and affect.     Assessment/Plan Medial meniscus tear right knee. Plan knee arthroscopy right knee.   Travers Goodley C 05/02/2011, 1:54 PM

## 2011-05-02 NOTE — Transfer of Care (Signed)
Immediate Anesthesia Transfer of Care Note  Patient: [name]  Procedure(s) Performed:  ARTHROSCOPY KNEE; KNEE ARTHROSCOPY WITH MEDIAL MENISECTOMY - partial; DEBRIDEMENT OF MENISCUS  Patient Location: Patient transported to PACU with oxygen via face mask at 6 Liters / Min  Anesthesia Type: General  Level of Consciousness: awake and alert   Airway & Oxygen Therapy: Patient Spontanous Breathing and Patient connected to face mask oxygen Post-op Assessment: Report given to PACU RN and Post -op Vital signs reviewed and stable  Post vital signs: Reviewed and stable  Complications: No apparent anesthesia complications

## 2011-05-02 NOTE — Anesthesia Procedure Notes (Signed)
Procedure Name: LMA Insertion Date/Time: 05/02/2011 2:10 PM Performed by: Lorrin Jackson Pre-anesthesia Checklist: Patient identified, Emergency Drugs available, Suction available and Patient being monitored Patient Re-evaluated:Patient Re-evaluated prior to inductionOxygen Delivery Method: Circle System Utilized Preoxygenation: Pre-oxygenation with 100% oxygen Intubation Type: IV induction Ventilation: Mask ventilation without difficulty LMA: LMA with gastric port inserted LMA Size: 4.0 Number of attempts: 1 Placement Confirmation: positive ETCO2 Tube secured with: Tape Dental Injury: Teeth and Oropharynx as per pre-operative assessment

## 2011-05-02 NOTE — Anesthesia Preprocedure Evaluation (Addendum)
Anesthesia Evaluation  Patient identified by MRN, date of birth, ID band Patient awake    Reviewed: Allergy & Precautions, H&P , NPO status , Patient's Chart, lab work & pertinent test results, reviewed documented beta blocker date and time   Airway Mallampati: II TM Distance: >3 FB Neck ROM: full    Dental No notable dental hx.    Pulmonary neg pulmonary ROS,  clear to auscultation  Pulmonary exam normal       Cardiovascular Exercise Tolerance: Good hypertension, Pt. on medications neg cardio ROS regular Normal    Neuro/Psych Negative Neurological ROS  Negative Psych ROS   GI/Hepatic negative GI ROS, Neg liver ROS,   Endo/Other  Negative Endocrine ROS  Renal/GU negative Renal ROS  Genitourinary negative   Musculoskeletal   Abdominal   Peds  Hematology negative hematology ROS (+)   Anesthesia Other Findings   Reproductive/Obstetrics negative OB ROS                           Anesthesia Physical Anesthesia Plan  ASA: II  Anesthesia Plan: General   Post-op Pain Management:    Induction:   Airway Management Planned: LMA  Additional Equipment:   Intra-op Plan:   Post-operative Plan:   Informed Consent: I have reviewed the patients History and Physical, chart, labs and discussed the procedure including the risks, benefits and alternatives for the proposed anesthesia with the patient or authorized representative who has indicated his/her understanding and acceptance.   Dental Advisory Given  Plan Discussed with: CRNA  Anesthesia Plan Comments:        Anesthesia Quick Evaluation

## 2011-05-02 NOTE — Brief Op Note (Signed)
05/02/2011  2:43 PM  PATIENT:  [name]  61 y.o. female  PRE-OPERATIVE DIAGNOSIS:  DJD, meniscus tear  POST-OPERATIVE DIAGNOSIS:  DJD, meniscus tear  PROCEDURE:  Procedure(s): ARTHROSCOPY KNEE KNEE ARTHROSCOPY WITH MEDIAL MENISECTOMY DEBRIDEMENT OF MENISCUS  SURGEON:  Surgeon(s): Javier Docker  PHYSICIAN ASSISTANT:   ASSISTANTS: none   ANESTHESIA:   general  EBL:  Total I/O In: 200 [I.V.:200] Out: -   BLOOD ADMINISTERED:none  DRAINS: none   LOCAL MEDICATIONS USED:  MARCAINE 20CC  SPECIMEN:  No Specimen  DISPOSITION OF SPECIMEN:  N/A  COUNTS:  YES  TOURNIQUET:  * No tourniquets in log *  DICTATION: .Other Dictation: Dictation Number [id number]  PLAN OF CARE: Discharge to home after PACU  PATIENT DISPOSITION:  PACU - hemodynamically stable.   Delay start of Pharmacological VTE agent (>24hrs) due to surgical blood loss or risk of bleeding:  {YES/NO/NOT APPLICABLE:20182

## 2011-05-05 ENCOUNTER — Encounter (HOSPITAL_BASED_OUTPATIENT_CLINIC_OR_DEPARTMENT_OTHER): Payer: Self-pay | Admitting: Specialist

## 2011-05-05 NOTE — Op Note (Signed)
NAMEDIERA, WIRKKALA NO.:  [id number]  MEDICAL RECORD NO.:  [id number]  LOCATION:                                 FACILITY:  PHYSICIAN:  Jene Every, M.D.    DATE OF BIRTH:  11-Oct-1950  DATE OF PROCEDURE:  05/02/2011 DATE OF DISCHARGE:                              OPERATIVE REPORT   PREOPERATIVE DIAGNOSIS:  Medial meniscal tear, right knee.  POSTOPERATIVE DIAGNOSES:  Medial meniscal tear, right knee; grade 4 chondromalacia, medial compartment.  PROCEDURES PERFORMED: 1. Right knee arthroscopy. 2. Partial medial meniscectomy. 3. Chondroplasty, medial femoral condyle and tibial plateau. 4. Light chondroplasty, patellofemoral joint.  ANESTHESIA:  General.  ASSISTANT:  None.  BRIEF HISTORY:  This is a 61 year old with pain following a knee injury. MRI indicating tricompartmental osteoarthritis, indicated for debridement.  Risks and benefits were discussed including bleeding, infection, damage to vascular structures, no change in symptoms, worsening symptoms, need for repeat debridement, DVT, PE, anesthetic complications etc.  TECHNIQUE:  With the patient in supine position, after induction of adequate general anesthesia, 1 g Kefzol, the right lower extremity was prepped and draped in the usual sterile fashion.  A lateral parapatellar portal was fashioned with a #11 blade.  Ingress cannula was atraumatically placed.  Irrigant was utilized to insufflate the joint. Under direct visualization, medial parapatellar portal was fashioned with a #11 blade after localization with an 18-gauge needle sparing the medial meniscus.  Noted medially was extensive grade 3 changes of femoral condyle, tibial plateau and grade 4 changes of the tibial plateau and of the femoral condyle.  Tearing along the entire medial meniscus posteriorly and anteriorly was noted and opposing between the femoral condyle and tibia.  I introduced a basket and utilized it to resect the medial  meniscus to a stable base.  50% of the meniscus was excised.  Light chondroplasty was performed at the femoral condyle and tibial plateau.  It was felt that the grade 4 lesion was too large for abrasion arthroplasty or microfracture.  The ACL was unremarkable.  Lateral compartment revealed minor degenerative changes.  Patellofemoral joint revealed grade 3 changes of the patella.  Light chondroplasty was performed here.  Normal patellofemoral tracking. Gutters were unremarkable.  Revisited all compartments. No further pathology amenable to arthroscopic intervention.  Therefore, removed all instrumentation.  Portals were closed with 4-0 nylon simple sutures.  0.25% Marcaine with epinephrine was infiltrated in the joint. The wound was dressed sterilely.  The patient was woken without difficulty and transported to Recovery in satisfactory condition.  The patient tolerated the procedure well.  No complications.  No assistant.  Minimal blood loss.     Jene Every, M.D.     Cordelia Pen  D:  05/02/2011  T:  05/03/2011  Job:  [id number]

## 2011-05-05 NOTE — Anesthesia Postprocedure Evaluation (Signed)
Anesthesia Post Note  Patient: [name]  Procedure(s) Performed:  ARTHROSCOPY KNEE; KNEE ARTHROSCOPY WITH MEDIAL MENISECTOMY - partial; DEBRIDEMENT OF MENISCUS  Anesthesia type: General  Patient location: PACU  Post pain: Pain level controlled  Post assessment: Post-op Vital signs reviewed  Last Vitals:  Filed Vitals:   05/02/11 1607  BP: 125/81  Pulse: 77  Temp: 36.1 C  Resp: 16    Post vital signs: Reviewed  Level of consciousness: sedated  Complications: No apparent anesthesia complications

## 2011-05-17 ENCOUNTER — Telehealth: Payer: Self-pay | Admitting: Oncology

## 2011-05-17 NOTE — Telephone Encounter (Signed)
called pts home and the number is not in service. will mail her feb appts to her home

## 2011-06-09 ENCOUNTER — Other Ambulatory Visit: Payer: 59 | Admitting: Lab

## 2011-06-09 ENCOUNTER — Telehealth: Payer: Self-pay | Admitting: *Deleted

## 2011-06-10 ENCOUNTER — Ambulatory Visit (HOSPITAL_BASED_OUTPATIENT_CLINIC_OR_DEPARTMENT_OTHER): Payer: 59

## 2011-06-10 DIAGNOSIS — C50919 Malignant neoplasm of unspecified site of unspecified female breast: Secondary | ICD-10-CM

## 2011-06-10 DIAGNOSIS — E559 Vitamin D deficiency, unspecified: Secondary | ICD-10-CM

## 2011-06-10 LAB — CBC WITH DIFFERENTIAL/PLATELET
Basophils Absolute: 0 10*3/uL (ref 0.0–0.1)
Eosinophils Absolute: 0.1 10*3/uL (ref 0.0–0.5)
HGB: 12.4 g/dL (ref 11.6–15.9)
LYMPH%: 41.7 % (ref 14.0–49.7)
MCV: 86.7 fL (ref 79.5–101.0)
MONO%: 7.6 % (ref 0.0–14.0)
NEUT#: 2.5 10*3/uL (ref 1.5–6.5)
NEUT%: 48 % (ref 38.4–76.8)
Platelets: 305 10*3/uL (ref 145–400)
RBC: 4.18 10*6/uL (ref 3.70–5.45)

## 2011-06-11 LAB — COMPREHENSIVE METABOLIC PANEL
Alkaline Phosphatase: 103 U/L (ref 39–117)
BUN: 14 mg/dL (ref 6–23)
Creatinine, Ser: 0.97 mg/dL (ref 0.50–1.10)
Glucose, Bld: 91 mg/dL (ref 70–99)
Total Bilirubin: 0.4 mg/dL (ref 0.3–1.2)

## 2011-06-11 LAB — VITAMIN D 25 HYDROXY (VIT D DEFICIENCY, FRACTURES): Vit D, 25-Hydroxy: 39 ng/mL (ref 30–89)

## 2011-06-16 ENCOUNTER — Ambulatory Visit: Payer: 59 | Admitting: Oncology

## 2011-06-19 ENCOUNTER — Ambulatory Visit (HOSPITAL_BASED_OUTPATIENT_CLINIC_OR_DEPARTMENT_OTHER): Payer: 59 | Admitting: Oncology

## 2011-06-19 VITALS — BP 113/73 | HR 90 | Temp 97.8°F | Ht 64.0 in | Wt 211.5 lb

## 2011-06-19 DIAGNOSIS — C50919 Malignant neoplasm of unspecified site of unspecified female breast: Secondary | ICD-10-CM

## 2011-06-19 DIAGNOSIS — Z170 Estrogen receptor positive status [ER+]: Secondary | ICD-10-CM

## 2011-06-19 MED ORDER — ANASTROZOLE 1 MG PO TABS: 1.0000 mg | ORAL_TABLET | Freq: Every day | ORAL | Status: AC

## 2011-06-19 NOTE — Progress Notes (Signed)
ID: [name]   DOB: [birth year]  MR#: [id number]  CSN#:[id number]  HISTORY OF PRESENT ILLNESS: She had injured her right breast slightly and when that resolved, she noted a little bit of a dimple in the breast, and this concerned her so she not only made an appointment with Dr. Parke Simmers, but she scheduled herself at [facility] for mammograms.  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    With this information, she was referred to Dr. Lurene Shadow who on 03-17-03 proceeded to right lumpectomy and sentinel lymph node biopsy.  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  Her final pathology report ([id number]) showed a 2.6 cm. Grade 2 with no evidence of lymphovascular invasion, negative margins and the sentinel lymph node not involved.    INTERVAL HISTORY: Malesha returns for followup of her breast cancer. Interval history is unremarkable. She enjoys her job in [employer] and doesn't let it get under her skin. Family is doing well. Her grandson sometimes stays with her and she says he wants to be an Technical sales engineer.  REVIEW OF SYSTEMS: She just had a right total knee replacement under just been and did very well with that. She is enjoying the rehabilitation. She is tolerating the anastrozole with no significant side effects that she is aware of. In particular she denies hot flashes and vaginal dryness issues. A detailed review of systems was otherwise noncontributory  PAST MEDICAL HISTORY: Past Medical History  Diagnosis Date  . Hypertension   . Cancer 2004    rt breast    PAST SURGICAL HISTORY: Past Surgical History  Procedure Date  . Breast lumpectomy 2004  . Portacath placement 2004  . Appendectomy 2007  . Knee arthroscopy 2007  . Anterior cervical decomp/discectomy fusion 2008  . Porta cath remove 2005  . Knee arthroscopy  05/02/2011    Procedure: ARTHROSCOPY KNEE;  Surgeon: Javier Docker;  Location: [facility];  Service: Orthopedics;  Laterality: Right;  . Meniscus debridement 05/02/2011    Procedure: DEBRIDEMENT OF MENISCUS;  Surgeon: Javier Docker;  Location: [facility];  Service: Orthopedics;  Laterality: Right;    FAMILY HISTORY The patient's mother is alive at age 69.  She was diagnosed with breast cancer at age 71.  She is doing fine on Arimidex.  The patient's father died from liver cancer in his 62's.  The patient has one brother and two sisters with no other cancer in the family.  GYNECOLOGIC HISTORY: GX P1.  Her pregnancy to term was age 36. 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  Her son, [name], lives in [place], is a Immunologist and she has a grandson, 95, living here in town and sometimes staying with her.  The patient is a member of [employer].   ADVANCED DIRECTIVES:  HEALTH MAINTENANCE: History  Substance Use Topics  . Smoking status: Never Smoker   . Smokeless tobacco: Not on file  . Alcohol Use: Yes     social     Colonoscopy:  PAP:  Bone density: AUG 2010, normal  Lipid panel:  No Known Allergies  Current Outpatient Prescriptions  Medication Sig Dispense Refill  . Olmesartan-Amlodipine-HCTZ (TRIBENZOR) 20-5-12.5 MG TABS Take by mouth.  OBJECTIVE: Middle-aged [place] American woman in no acute distress Filed Vitals:   06/19/11 1414  BP: 113/73  Pulse: 90  Temp: 97.8 F (36.6 C)     Body mass index is 36.30 kg/(m^2).    ECOG FS: 0  Sclerae unicteric Oropharynx clear No peripheral adenopathy Lungs no rales or rhonchi Heart regular rate and rhythm Abd benign MSK no focal spinal tenderness, no peripheral edema Neuro: nonfocal Breasts: The right breast is status post lumpectomy and radiation. There is no evidence of local  recurrence. Left breast no suspicious findings  LAB RESULTS: Lab Results  Component Value Date   WBC 5.3 06/10/2011   NEUTROABS 2.5 06/10/2011   HGB 12.4 06/10/2011   HCT 36.3 06/10/2011   MCV 86.7 06/10/2011   PLT 305 06/10/2011      Chemistry      Component Value Date/Time   NA 141 06/10/2011 1402   K 3.5 06/10/2011 1402   CL 104 06/10/2011 1402   CO2 27 06/10/2011 1402   BUN 14 06/10/2011 1402   CREATININE 0.97 06/10/2011 1402      Component Value Date/Time   CALCIUM 9.9 06/10/2011 1402   ALKPHOS 103 06/10/2011 1402   AST 17 06/10/2011 1402   ALT 16 06/10/2011 1402   BILITOT 0.4 06/10/2011 1402       Lab Results  Component Value Date   LABCA2 23 06/10/2011    No results found for this basename: INR:1;PROTIME:1 in the last 168 hours  No results found for this basename: UACOL:1,UAPR:1,USPG:1,UPH:1,UTP:1,UGL:1,UKET:1,UBIL:1,UHGB:1,UNIT:1,UROB:1,ULEU:1,UEPI:1,UWBC:1,URBC:1,UBAC:1,CAST:1,CRYS:1,UCOM:1,BILUA:1 in the last 72 hours   STUDIES: No new results found.  ASSESSMENT: 61 year old [place] woman status post right lumpectomy with sentinel node dissection November 2004 for a T2N1, stage IIB invasive ductal carcinoma, grade 2, ER/PR positive, HER-2/neu negative.  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  Status post radiation therapy completed June 2005.  Was started on tamoxifen at that time and continued for 5 years, after which she switched over to anastrozole.    PLAN: We will continue the anastrozole to June of 2015. She will have her next mammogram in May of this year. I encouraged her to improve on her walking program. She had a normal bone density August of 2010. We will repeat that study when she has her May  mammography   Exa Bomba C    06/19/2011

## 2011-06-20 ENCOUNTER — Telehealth: Payer: Self-pay | Admitting: Oncology

## 2011-06-20 NOTE — Telephone Encounter (Signed)
S/w the pt regarding her bone density appt [facility] for may 2013

## 2011-07-10 ENCOUNTER — Encounter: Payer: Self-pay | Admitting: Internal Medicine

## 2011-08-14 ENCOUNTER — Ambulatory Visit
Admission: RE | Admit: 2011-08-14 | Discharge: 2011-08-14 | Disposition: A | Discharge status: Home / Self Care | Payer: 59 | Source: Ambulatory Visit | Attending: Family Medicine | Admitting: Family Medicine

## 2011-08-14 ENCOUNTER — Other Ambulatory Visit: Payer: Self-pay | Admitting: Family Medicine

## 2011-08-14 ENCOUNTER — Other Ambulatory Visit: Payer: 59

## 2011-08-14 DIAGNOSIS — E049 Nontoxic goiter, unspecified: Secondary | ICD-10-CM

## 2011-08-18 ENCOUNTER — Other Ambulatory Visit: Payer: Self-pay | Admitting: Family Medicine

## 2011-08-19 ENCOUNTER — Other Ambulatory Visit: Payer: Self-pay | Admitting: Family Medicine

## 2011-08-19 DIAGNOSIS — E041 Nontoxic single thyroid nodule: Secondary | ICD-10-CM

## 2011-08-22 ENCOUNTER — Other Ambulatory Visit: Payer: Self-pay | Admitting: Oncology

## 2011-08-22 DIAGNOSIS — C50219 Malignant neoplasm of upper-inner quadrant of unspecified female breast: Secondary | ICD-10-CM

## 2011-08-26 ENCOUNTER — Ambulatory Visit
Admission: RE | Admit: 2011-08-26 | Discharge: 2011-08-26 | Disposition: A | Discharge status: Home / Self Care | Payer: 59 | Source: Ambulatory Visit | Attending: Family Medicine | Admitting: Family Medicine

## 2011-08-26 ENCOUNTER — Other Ambulatory Visit: Payer: Self-pay | Admitting: Family Medicine

## 2011-08-26 ENCOUNTER — Other Ambulatory Visit (HOSPITAL_COMMUNITY)
Admission: RE | Admit: 2011-08-26 | Discharge: 2011-08-26 | Disposition: A | Discharge status: Home / Self Care | Payer: 59 | Source: Ambulatory Visit | Attending: Diagnostic Radiology | Admitting: Diagnostic Radiology

## 2011-08-26 DIAGNOSIS — Z1231 Encounter for screening mammogram for malignant neoplasm of breast: Secondary | ICD-10-CM

## 2011-08-26 DIAGNOSIS — E049 Nontoxic goiter, unspecified: Secondary | ICD-10-CM | POA: Insufficient documentation

## 2011-08-26 DIAGNOSIS — E041 Nontoxic single thyroid nodule: Secondary | ICD-10-CM

## 2011-09-05 ENCOUNTER — Encounter: Payer: 59 | Admitting: Internal Medicine

## 2011-09-19 ENCOUNTER — Encounter: Payer: 59 | Admitting: Internal Medicine

## 2011-09-19 ENCOUNTER — Ambulatory Visit: Payer: 59

## 2011-10-03 ENCOUNTER — Ambulatory Visit: Payer: 59

## 2011-10-17 ENCOUNTER — Ambulatory Visit: Payer: 59

## 2011-10-31 ENCOUNTER — Ambulatory Visit: Payer: 59

## 2011-11-12 ENCOUNTER — Ambulatory Visit
Admission: RE | Admit: 2011-11-12 | Discharge: 2011-11-12 | Disposition: A | Discharge status: Home / Self Care | Payer: 59 | Source: Ambulatory Visit | Attending: Family Medicine | Admitting: Family Medicine

## 2011-11-12 DIAGNOSIS — Z1231 Encounter for screening mammogram for malignant neoplasm of breast: Secondary | ICD-10-CM

## 2011-11-14 ENCOUNTER — Encounter: Payer: 59 | Admitting: Internal Medicine

## 2011-11-27 ENCOUNTER — Encounter (HOSPITAL_COMMUNITY): Payer: Self-pay | Admitting: Family Medicine

## 2011-11-27 ENCOUNTER — Inpatient Hospital Stay (HOSPITAL_COMMUNITY)
Admission: EM | Admit: 2011-11-27 | Discharge: 2011-11-29 | DRG: 556 | Disposition: A | Discharge status: Home Health | Payer: 59 | Attending: Internal Medicine | Admitting: Internal Medicine

## 2011-11-27 DIAGNOSIS — R29898 Other symptoms and signs involving the musculoskeletal system: Principal | ICD-10-CM | POA: Diagnosis present

## 2011-11-27 DIAGNOSIS — R531 Weakness: Secondary | ICD-10-CM | POA: Diagnosis present

## 2011-11-27 DIAGNOSIS — C50219 Malignant neoplasm of upper-inner quadrant of unspecified female breast: Secondary | ICD-10-CM

## 2011-11-27 DIAGNOSIS — G5630 Lesion of radial nerve, unspecified upper limb: Secondary | ICD-10-CM | POA: Diagnosis present

## 2011-11-27 DIAGNOSIS — I10 Essential (primary) hypertension: Secondary | ICD-10-CM | POA: Diagnosis present

## 2011-11-27 DIAGNOSIS — C50919 Malignant neoplasm of unspecified site of unspecified female breast: Secondary | ICD-10-CM

## 2011-11-27 DIAGNOSIS — Z9010 Acquired absence of unspecified breast and nipple: Secondary | ICD-10-CM

## 2011-11-27 DIAGNOSIS — E876 Hypokalemia: Secondary | ICD-10-CM | POA: Diagnosis present

## 2011-11-27 DIAGNOSIS — Z853 Personal history of malignant neoplasm of breast: Secondary | ICD-10-CM

## 2011-11-27 DIAGNOSIS — Z79899 Other long term (current) drug therapy: Secondary | ICD-10-CM

## 2011-11-27 DIAGNOSIS — E039 Hypothyroidism, unspecified: Secondary | ICD-10-CM | POA: Diagnosis present

## 2011-11-27 HISTORY — DX: Hypothyroidism, unspecified: E03.9

## 2011-11-27 NOTE — ED Notes (Signed)
Patient states that she was at work and her left hand would start drawing up when she would try to use it. No visual changes. Reports feeling hesistant with speech and feels like she was slurring. Complaining of heaviness to left arm.

## 2011-11-28 ENCOUNTER — Emergency Department (HOSPITAL_COMMUNITY): Payer: 59

## 2011-11-28 ENCOUNTER — Encounter (HOSPITAL_COMMUNITY): Payer: Self-pay | Admitting: Internal Medicine

## 2011-11-28 ENCOUNTER — Inpatient Hospital Stay (HOSPITAL_COMMUNITY): Payer: 59

## 2011-11-28 DIAGNOSIS — I10 Essential (primary) hypertension: Secondary | ICD-10-CM

## 2011-11-28 DIAGNOSIS — R5381 Other malaise: Secondary | ICD-10-CM

## 2011-11-28 DIAGNOSIS — R0989 Other specified symptoms and signs involving the circulatory and respiratory systems: Secondary | ICD-10-CM

## 2011-11-28 DIAGNOSIS — R29898 Other symptoms and signs involving the musculoskeletal system: Secondary | ICD-10-CM | POA: Diagnosis present

## 2011-11-28 DIAGNOSIS — E039 Hypothyroidism, unspecified: Secondary | ICD-10-CM | POA: Diagnosis present

## 2011-11-28 DIAGNOSIS — I517 Cardiomegaly: Secondary | ICD-10-CM

## 2011-11-28 DIAGNOSIS — R531 Weakness: Secondary | ICD-10-CM | POA: Diagnosis present

## 2011-11-28 DIAGNOSIS — R5383 Other fatigue: Secondary | ICD-10-CM

## 2011-11-28 DIAGNOSIS — E876 Hypokalemia: Secondary | ICD-10-CM | POA: Diagnosis present

## 2011-11-28 LAB — POCT I-STAT, CHEM 8
Calcium, Ion: 1.14 mmol/L (ref 1.13–1.30)
Chloride: 102 mEq/L (ref 96–112)
HCT: 34 % — ABNORMAL LOW (ref 36.0–46.0)
Sodium: 141 mEq/L (ref 135–145)

## 2011-11-28 LAB — COMPREHENSIVE METABOLIC PANEL WITH GFR
ALT: 16 U/L (ref 0–35)
AST: 16 U/L (ref 0–37)
Albumin: 3.8 g/dL (ref 3.5–5.2)
Alkaline Phosphatase: 93 U/L (ref 39–117)
BUN: 19 mg/dL (ref 6–23)
CO2: 29 meq/L (ref 19–32)
Calcium: 9.2 mg/dL (ref 8.4–10.5)
Chloride: 100 meq/L (ref 96–112)
Creatinine, Ser: 0.87 mg/dL (ref 0.50–1.10)
GFR calc Af Amer: 82 mL/min — ABNORMAL LOW
GFR calc non Af Amer: 71 mL/min — ABNORMAL LOW
Glucose, Bld: 101 mg/dL — ABNORMAL HIGH (ref 70–99)
Potassium: 3.2 meq/L — ABNORMAL LOW (ref 3.5–5.1)
Sodium: 140 meq/L (ref 135–145)
Total Bilirubin: 0.3 mg/dL (ref 0.3–1.2)
Total Protein: 7.2 g/dL (ref 6.0–8.3)

## 2011-11-28 LAB — MAGNESIUM: Magnesium: 2.5 mg/dL (ref 1.5–2.5)

## 2011-11-28 LAB — CBC WITH DIFFERENTIAL/PLATELET
Basophils Relative: 0 % (ref 0–1)
HCT: 33.9 % — ABNORMAL LOW (ref 36.0–46.0)
Hemoglobin: 11.4 g/dL — ABNORMAL LOW (ref 12.0–15.0)
Lymphocytes Relative: 44 % (ref 12–46)
Lymphs Abs: 3.5 10*3/uL (ref 0.7–4.0)
Monocytes Absolute: 0.4 10*3/uL (ref 0.1–1.0)
Monocytes Relative: 6 % (ref 3–12)
Neutro Abs: 3.8 10*3/uL (ref 1.7–7.7)
Neutrophils Relative %: 48 % (ref 43–77)
RBC: 3.91 MIL/uL (ref 3.87–5.11)
WBC: 7.9 10*3/uL (ref 4.0–10.5)

## 2011-11-28 LAB — SEDIMENTATION RATE: Sed Rate: 33 mm/h — ABNORMAL HIGH (ref 0–22)

## 2011-11-28 LAB — LIPID PANEL
Cholesterol: 203 mg/dL — ABNORMAL HIGH (ref 0–200)
Triglycerides: 75 mg/dL (ref <150)
VLDL: 15 mg/dL (ref 0–40)

## 2011-11-28 LAB — TSH: TSH: 3.333 u[IU]/mL (ref 0.350–4.500)

## 2011-11-28 LAB — PROTIME-INR
INR: 0.91 (ref 0.00–1.49)
Prothrombin Time: 12.5 seconds (ref 11.6–15.2)

## 2011-11-28 LAB — HEMOGLOBIN A1C
Hgb A1c MFr Bld: 5.7 % — ABNORMAL HIGH (ref <5.7)
Mean Plasma Glucose: 117 mg/dL — ABNORMAL HIGH (ref <117)

## 2011-11-28 LAB — URINALYSIS, ROUTINE W REFLEX MICROSCOPIC
Glucose, UA: NEGATIVE mg/dL
Hgb urine dipstick: NEGATIVE
Specific Gravity, Urine: 1.03 (ref 1.005–1.030)
Urobilinogen, UA: 0.2 mg/dL (ref 0.0–1.0)
pH: 6.5 (ref 5.0–8.0)

## 2011-11-28 LAB — URINE MICROSCOPIC-ADD ON

## 2011-11-28 LAB — ANTITHROMBIN III: AntiThromb III Func: 105 % (ref 75–120)

## 2011-11-28 LAB — POCT I-STAT TROPONIN I: Troponin i, poc: 0 ng/mL (ref 0.00–0.08)

## 2011-11-28 MED ORDER — OLMESARTAN-AMLODIPINE-HCTZ 20-5-12.5 MG PO TABS: 1.0000 | ORAL_TABLET | Freq: Every day | ORAL | Status: DC

## 2011-11-28 MED ORDER — ASPIRIN 325 MG PO TABS
325.0000 mg | ORAL_TABLET | Freq: Every day | ORAL | Status: DC
  Administered 2011-11-28 – 2011-11-29 (×2): 325 mg via ORAL
  Filled 2011-11-28 (×2): qty 1

## 2011-11-28 MED ORDER — OLMESARTAN MEDOXOMIL 20 MG PO TABS
20.0000 mg | ORAL_TABLET | Freq: Every day | ORAL | Status: DC
  Administered 2011-11-28 – 2011-11-29 (×2): 20 mg via ORAL
  Filled 2011-11-28 (×2): qty 1

## 2011-11-28 MED ORDER — ANASTROZOLE 1 MG PO TABS
1.0000 mg | ORAL_TABLET | Freq: Every day | ORAL | Status: DC
  Administered 2011-11-28 – 2011-11-29 (×2): 1 mg via ORAL
  Filled 2011-11-28 (×2): qty 1

## 2011-11-28 MED ORDER — AMLODIPINE BESYLATE 5 MG PO TABS
5.0000 mg | ORAL_TABLET | Freq: Every day | ORAL | Status: DC
  Administered 2011-11-28 – 2011-11-29 (×2): 5 mg via ORAL
  Filled 2011-11-28 (×2): qty 1

## 2011-11-28 MED ORDER — ATORVASTATIN CALCIUM 40 MG PO TABS
40.0000 mg | ORAL_TABLET | Freq: Every day | ORAL | Status: DC
  Administered 2011-11-28: 40 mg via ORAL
  Filled 2011-11-28 (×2): qty 1

## 2011-11-28 MED ORDER — ASPIRIN 300 MG RE SUPP
300.0000 mg | Freq: Every day | RECTAL | Status: DC
  Filled 2011-11-28 (×2): qty 1

## 2011-11-28 MED ORDER — SENNOSIDES-DOCUSATE SODIUM 8.6-50 MG PO TABS
1.0000 | ORAL_TABLET | Freq: Every evening | ORAL | Status: DC | PRN
  Filled 2011-11-28: qty 1

## 2011-11-28 MED ORDER — SODIUM CHLORIDE 0.9 % IV SOLN
INTRAVENOUS | Status: DC
  Administered 2011-11-28: 04:00:00 via INTRAVENOUS

## 2011-11-28 MED ORDER — POTASSIUM CHLORIDE CRYS ER 20 MEQ PO TBCR
40.0000 meq | EXTENDED_RELEASE_TABLET | ORAL | Status: AC
  Administered 2011-11-28 (×2): 40 meq via ORAL
  Filled 2011-11-28 (×2): qty 2

## 2011-11-28 MED ORDER — LEVOTHYROXINE SODIUM 25 MCG PO TABS
25.0000 ug | ORAL_TABLET | Freq: Every day | ORAL | Status: DC
  Administered 2011-11-28 – 2011-11-29 (×2): 25 ug via ORAL
  Filled 2011-11-28 (×3): qty 1

## 2011-11-28 MED ORDER — HYDROCHLOROTHIAZIDE 12.5 MG PO CAPS
12.5000 mg | ORAL_CAPSULE | Freq: Every day | ORAL | Status: DC
  Administered 2011-11-28 – 2011-11-29 (×2): 12.5 mg via ORAL
  Filled 2011-11-28 (×2): qty 1

## 2011-11-28 MED ORDER — POTASSIUM CHLORIDE 10 MEQ/100ML IV SOLN
10.0000 meq | INTRAVENOUS | Status: AC
  Administered 2011-11-28 (×2): 10 meq via INTRAVENOUS
  Filled 2011-11-28: qty 200

## 2011-11-28 NOTE — Progress Notes (Signed)
I have seen and assessed patient and agree with Dr Elmyra Ricks ASSESMENT AND PLAN. MRI head negative for acute infarct. MRI C spine pending to r/o C8 radiculopathy. 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

## 2011-11-28 NOTE — ED Provider Notes (Signed)
History     CSN: [id number]  Arrival date & time 11/27/11  2338   First MD Initiated Contact with Patient 11/28/11 0002      Chief Complaint  Patient presents with  . Extremity Weakness    (Consider location/radiation/quality/duration/timing/severity/associated sxs/prior treatment) Patient is a 61 y.o. female presenting with extremity weakness. The history is provided by the patient. No language interpreter was used.  Extremity Weakness This is a new problem. The current episode started 6 to 12 hours ago. The problem occurs constantly. The problem has not changed since onset.Pertinent negatives include no chest pain, no abdominal pain, no headaches and no shortness of breath. Nothing aggravates the symptoms. She has tried nothing for the symptoms. The treatment provided no relief.  Also had difficulty making speech Which has resolved. No visual changes Past Medical History  Diagnosis Date  . Hypertension   . Cancer 2004    rt breast  . Hypothyroidism     Past Surgical History  Procedure Date  . Breast lumpectomy 2004  . Portacath placement 2004  . Appendectomy 2007  . Knee arthroscopy 2007  . Anterior cervical decomp/discectomy fusion 2008  . Porta cath remove 2005  . Knee arthroscopy 05/02/2011    Procedure: ARTHROSCOPY KNEE;  Surgeon: Javier Docker;  Location: [facility];  Service: Orthopedics;  Laterality: Right;  . Meniscus debridement 05/02/2011    Procedure: DEBRIDEMENT OF MENISCUS;  Surgeon: Javier Docker;  Location: [facility];  Service: Orthopedics;  Laterality: Right;  . Portacath removal     History reviewed. No pertinent family history.  History  Substance Use Topics  . Smoking status: Never Smoker   . Smokeless tobacco: Not on file  . Alcohol Use: 1.5 oz/week    3 drink(s) per week     social    OB History    Grav Para Term Preterm Abortions TAB SAB Ect Mult Living                  Review of Systems  Respiratory:  Negative for shortness of breath.   Cardiovascular: Negative for chest pain.  Gastrointestinal: Negative for abdominal pain.  Musculoskeletal: Positive for extremity weakness.  Neurological: Positive for weakness. Negative for facial asymmetry and headaches.  All other systems reviewed and are negative.    Allergies  Penicillins  Home Medications   Current Outpatient Rx  Name Route Sig Dispense Refill  . ANASTROZOLE 1 MG PO TABS  TAKE 1 TABLET DAILY 90 tablet 3  . LEVOTHYROXINE SODIUM 25 MCG PO TABS Oral Take 25 mcg by mouth daily.    Marland Kitchen OLMESARTAN-AMLODIPINE-HCTZ 20-5-12.5 MG PO TABS Oral Take 1 tablet by mouth daily.       BP 117/68  Pulse 90  Temp 98 F (36.7 C) (Oral)  Resp 14  Wt 214 lb 2 oz (97.126 kg)  SpO2 97%  Physical Exam  Constitutional: She is oriented to person, place, and time. She appears well-developed and well-nourished.  HENT:  Head: Normocephalic and atraumatic.  Mouth/Throat: Oropharynx is clear and moist.  Eyes: Conjunctivae are normal. Pupils are equal, round, and reactive to light.  Neck: Normal range of motion. Neck supple.  Cardiovascular: Normal rate and regular rhythm.   Pulmonary/Chest: Effort normal and breath sounds normal. She has no wheezes. She has no rales.  Abdominal: Soft. Bowel sounds are normal.  Neurological: She is alert and oriented to person, place, and time. She has normal reflexes. No cranial nerve deficit.  Weakness left wrist  Skin: Skin is warm and dry.  Psychiatric: She has a normal mood and affect.    ED Course  Procedures (including critical care time)  Labs Reviewed  CBC WITH DIFFERENTIAL - Abnormal; Notable for the following:    Hemoglobin 11.4 (*)     HCT 33.9 (*)     All other components within normal limits  URINALYSIS, ROUTINE W REFLEX MICROSCOPIC - Abnormal; Notable for the following:    Leukocytes, UA SMALL (*)     All other components within normal limits  POCT I-STAT, CHEM 8 - Abnormal; Notable  for the following:    Potassium 3.3 (*)     Hemoglobin 11.6 (*)     HCT 34.0 (*)     All other components within normal limits  PROTIME-INR  URINE MICROSCOPIC-ADD ON  POCT I-STAT TROPONIN I  HEMOGLOBIN A1C  COMPREHENSIVE METABOLIC PANEL  ANTITHROMBIN III  PROTEIN C ACTIVITY  PROTEIN C, TOTAL  PROTEIN S ACTIVITY  PROTEIN S, TOTAL  LUPUS ANTICOAGULANT PANEL  BETA-2-GLYCOPROTEIN I ABS, IGG/M/A  HOMOCYSTEINE, SERUM  FACTOR 5 LEIDEN  CARDIOLIPIN ANTIBODIES, IGG, IGM, IGA   Dg Chest 2 View  11/28/2011  *RADIOLOGY REPORT*  Clinical Data: Extremity weakness.  Nonsmoker.  CHEST - 2 VIEW  Comparison: 12/30/2006  Findings: Patient has had prior cervical fusion.  Heart size is normal.  Lungs are free of focal consolidations and pleural effusions.  Shallow inflation.  Degenerative changes are seen in the spine.  IMPRESSION:  1.  Shallow inflation. 2. No evidence for acute  abnormality.  Original Report Authenticated By: Patterson Hammersmith, M.D.   Ct Head Wo Contrast  11/28/2011  *RADIOLOGY REPORT*  Clinical Data: Left arm weakness  CT HEAD WITHOUT CONTRAST  Technique:  Contiguous axial images were obtained from the base of the skull through the vertex without contrast.  Comparison: 04/24/2003  Findings: There is no evidence for acute hemorrhage, hydrocephalus, mass lesion, or abnormal extra-axial fluid collection.  Mild mineralization within the basal ganglia and cerebellum.  No definite CT evidence for acute infarction.  The visualized paranasal sinuses and mastoid air cells are predominately clear.  IMPRESSION: No acute intracranial abnormality identified. If clinical concern for acute ischemia persists, MRI follow-up recommended.  Original Report Authenticated By: Waneta Martins, M.D.     1. Malignant neoplasm of upper-inner quadrant of female breast   2. Hypertension   3. Weakness       MDM   Date: 11/28/2011  Rate:83   Rhythm: normal sinus rhythm  QRS Axis: normal  Intervals:  normal  ST/T Wave abnormalities: normal  Conduction Disutrbances: none  Narrative Interpretation: unremarkable           Laray Rivkin Smitty Cords, MD 11/28/11 [phone]

## 2011-11-28 NOTE — Evaluation (Signed)
Physical Therapy One time Evaluation and d/c from acute PT Patient Details Name: [name] MRN: [id number] DOB: 10-Mar-1951 Today's Date: 11/28/2011 Time: 1027-2536 PT Time Calculation (min): 13 min  PT Assessment / Plan / Recommendation Clinical Impression  Pt admitted for L UE weakness and stroke workup negative.  Pt with previous hx of anterior cervical decomp/discectomy fusion.  Pt reports prior to that surgery she had radiating and shooting pain down left arm however reports currently only clumsy left UE and denies numbness, tingling, and pain.  Pt also reports stiff neck feels as though muscles are tight however no pain in neck or down arm.  Pt reports neurologist was in to see her earlier and ordered test.  MRI ordered for c-spine for possible cervical radiculopathy.  Depending on pt diagnosis please consider outpatient physical therapy to decrease neck and L UE symptoms.  No acute PT needs as pt is independent with mobility.    PT Assessment  All further PT needs can be met in the next venue of care    Follow Up Recommendations  Outpatient PT    Barriers to Discharge        Equipment Recommendations  None recommended by PT    Recommendations for Other Services     Frequency      Precautions / Restrictions Precautions Precautions: None   Pertinent Vitals/Pain Denies pain      Mobility  Bed Mobility Bed Mobility: Supine to Sit Supine to Sit: 7: Independent;HOB elevated Transfers Transfers: Sit to Stand;Stand to Sit Sit to Stand: 7: Independent Stand to Sit: 7: Independent Ambulation/Gait Ambulation/Gait Assistance: 7: Independent Ambulation Distance (Feet): 300 Feet Assistive device: None Ambulation/Gait Assistance Details: no LOB, good pace Gait Pattern: Within Functional Limits    Exercises     PT Diagnosis:    PT Problem List: Decreased strength;Other (comment) (per OT decreased L wrist strength, symptoms in neck ) PT Treatment Interventions:     PT  Goals    Visit Information  Last PT Received On: 11/28/11 Assistance Needed: +1    Subjective Data  Subjective: My arm just feels clumsy.   Prior Functioning  Home Living Lives With: Family Available Help at Discharge: Other (Comment) (daugther in law and grandson live with her and are in/out) Type of Home: Apartment Home Access: Stairs to enter Secretary/administrator of Steps: 4 Entrance Stairs-Rails: Right Home Layout: One level Bathroom Shower/Tub: Engineer, manufacturing systems: Standard Home Adaptive Equipment: None Additional Comments: Pt reports she will be moving into a house in Sept Prior Function Level of Independence: Independent Able to Take Stairs?: Yes Driving: Yes Vocation: Full time employment Comments: works split shift 9 am to 1 pm and then again 5 to 11 pm Communication Communication: No difficulties Dominant Hand: Right    Cognition  Overall Cognitive Status: Appears within functional limits for tasks assessed/performed Arousal/Alertness: Awake/alert Orientation Level: Appears intact for tasks assessed Behavior During Session: [facility] for tasks performed    Extremity/Trunk Assessment Right Upper Extremity Assessment RUE ROM/Strength/Tone: WFL for tasks assessed RUE Sensation: WFL - Light Touch Left Upper Extremity Assessment LUE ROM/Strength/Tone: Deficits LUE ROM/Strength/Tone Deficits: WFL at shoulder and elbow. Note weak wrist extensors 2+/5 but wrist flexors WFL for strength/ROM. Grip is good.  LUE Sensation: WFL - Light Touch LUE Coordination: WFL - gross/fine motor (able to touch each digit to thumb accurately with speed) Right Lower Extremity Assessment RLE ROM/Strength/Tone: [facility] for tasks assessed Left Lower Extremity Assessment LLE ROM/Strength/Tone: [facility] for tasks assessed  Balance Balance Balance Assessed: Yes Dynamic Standing Balance Dynamic Standing - Level of Assistance: 7: Independent  End of Session PT - End of Session Activity  Tolerance: Patient tolerated treatment well Patient left: in bed;with call bell/phone within reach Nurse Communication: Other (comment) (RN observed ambulation in hallway)  GP     [name],KATHrine E 11/28/2011, 1:43 PM Pager: [phone]

## 2011-11-28 NOTE — Consult Note (Signed)
Reason for Consult:Left arm weakness Referring Physician: Janee Morn, D   CC: Left wrist weakness  HPI: [name] is an 61 y.o. female with a history of two days of weakness of her left wrist. She states that she initially woke up with it, but then it improved over the course of the day, however yesterday she had it persistently throughout the day. She notes that it has improved a little currently. She denies pain or numbness. She does have a history of an ACDF in 2008, and still sometimes has neck stiffness, but currently no neck pain. She denies any symptoms in her face or leg.   Past Medical History  Diagnosis Date  . Hypertension   . Cancer 2004    rt breast  . Hypothyroidism     Past Surgical History  Procedure Date  . Breast lumpectomy 2004  . Portacath placement 2004  . Appendectomy 2007  . Knee arthroscopy 2007  . Anterior cervical decomp/discectomy fusion 2008  . Porta cath remove 2005  . Knee arthroscopy 05/02/2011    Procedure: ARTHROSCOPY KNEE;  Surgeon: Javier Docker;  Location: [facility];  Service: Orthopedics;  Laterality: Right;  . Meniscus debridement 05/02/2011    Procedure: DEBRIDEMENT OF MENISCUS;  Surgeon: Javier Docker;  Location: [facility];  Service: Orthopedics;  Laterality: Right;  . Portacath removal     History reviewed. No pertinent family history.  Social History:  reports that she has never smoked. She does not have any smokeless tobacco history on file. She reports that she drinks about 1.5 ounces of alcohol per week. She reports that she does not use illicit drugs.  Allergies  Allergen Reactions  . Penicillins     childhood    Medications:  Scheduled:   . olmesartan  20 mg Oral Daily   And  . amLODipine  5 mg Oral Daily   And  . hydrochlorothiazide  12.5 mg Oral Daily  . anastrozole  1 mg Oral Daily  . aspirin  300 mg Rectal Daily   Or  . aspirin  325 mg Oral Daily  . atorvastatin  40 mg Oral  q1800  . levothyroxine  25 mcg Oral Daily  . potassium chloride  10 mEq Intravenous Q1 Hr x 2  . potassium chloride  40 mEq Oral Q4H  . DISCONTD: Olmesartan-Amlodipine-HCTZ  1 tablet Oral Daily   ZOX:[id number]  ROS: A 12 point ROS was performed and is negative except as noted in the HPI.   Physical Examination: Blood pressure 135/82, pulse 96, temperature 97.5 F (36.4 C), temperature source Oral, resp. rate 18, height 5\' 4"  (1.626 m), weight 96.3 kg (212 lb 4.9 oz), SpO2 94.00%.  Neurologic Examination Mental Status: Awake, Alert, Oriented to person, place, and situation, immediate and remote memory are intact, able to give a clear and coherent history Cranial Nerves: II- Visual fields full II/IV/VI-EOMI V/VII-facial movements and sensation are intact VIII-hearing intact to voice IX/X-uvula elevates midline XI-shoulder shrug symmetric.  XII-tongue midline.  Motor:  Strength is listed below on a 5 point scale as R/L: Arm abduction 5/5 Elbow flexion 5/5 Elbow extension 5/5 Wrist flexion 5/5 Wrist extension 5/4- Finger extension 5/4- Grip 5/5 Interossei of the hand 5/4  Legs have full strength throughout.  Sensory: intact to light touch, temperature and vibration. DTR's:1+ at the triceps and biceps bilaterally, 2+ at the patellae Cerebellar: no dysmetria FNF bilaterally, though has some trouble on left due to wrist extension weakness.  Laboratory Studies:   Basic Metabolic Panel:  Lab 11/28/11 1610 11/28/11 0126 11/28/11 0105  NA 140 141 --  K 3.2* 3.3* --  CL 100 102 --  CO2 29 -- --  GLUCOSE 101* 98 --  BUN 19 22 --  CREATININE 0.87 1.00 --  CALCIUM 9.2 -- --  MG -- -- 2.5  PHOS -- -- --    Liver Function Tests:  Lab 11/28/11 0420  AST 16  ALT 16  ALKPHOS 93  BILITOT 0.3  PROT 7.2  ALBUMIN 3.8   No results found for this basename: LIPASE:5,AMYLASE:5 in the last 168 hours No results found for this basename: AMMONIA:3 in the last 168  hours  CBC:  Lab 11/28/11 0126 11/28/11 0115  WBC -- 7.9  NEUTROABS -- 3.8  HGB 11.6* 11.4*  HCT 34.0* 33.9*  MCV -- 86.7  PLT -- 328    Cardiac Enzymes: No results found for this basename: CKTOTAL:5,CKMB:5,CKMBINDEX:5,TROPONINI:5 in the last 168 hours  BNP: No components found with this basename: POCBNP:5  CBG: No results found for this basename: GLUCAP:5 in the last 168 hours  Microbiology: No results found for this or any previous visit.  Coagulation Studies:  Basename 11/28/11 0115  LABPROT 12.5  INR 0.91    Urinalysis:  Lab 11/28/11 0107  COLORURINE YELLOW  LABSPEC 1.030  PHURINE 6.5  GLUCOSEU NEGATIVE  HGBUR NEGATIVE  BILIRUBINUR NEGATIVE  KETONESUR NEGATIVE  PROTEINUR NEGATIVE  UROBILINOGEN 0.2  NITRITE NEGATIVE  LEUKOCYTESUR SMALL*    Lipid Panel:     Component Value Date/Time   CHOL 203* 11/28/2011 0421   TRIG 75 11/28/2011 0421   HDL 54 11/28/2011 0421   CHOLHDL 3.8 11/28/2011 0421   VLDL 15 11/28/2011 0421   LDLCALC 134* 11/28/2011 0421    HgbA1C:  No results found for this basename: HGBA1C    Urine Drug Screen:   No results found for this basename: labopia, cocainscrnur, labbenz, amphetmu, thcu, labbarb    Alcohol Level: No results found for this basename: ETH:2 in the last 168 hours   Imaging: Dg Chest 2 View  11/28/2011  *RADIOLOGY REPORT*  Clinical Data: Extremity weakness.  Nonsmoker.  CHEST - 2 VIEW  Comparison: 12/30/2006  Findings: Patient has had prior cervical fusion.  Heart size is normal.  Lungs are free of focal consolidations and pleural effusions.  Shallow inflation.  Degenerative changes are seen in the spine.  IMPRESSION:  1.  Shallow inflation. 2. No evidence for acute  abnormality.  Original Report Authenticated By: Patterson Hammersmith, M.D.   Ct Head Wo Contrast  11/28/2011  *RADIOLOGY REPORT*  Clinical Data: Left arm weakness  CT HEAD WITHOUT CONTRAST  Technique:  Contiguous axial images were obtained from the base of the skull  through the vertex without contrast.  Comparison: 04/24/2003  Findings: There is no evidence for acute hemorrhage, hydrocephalus, mass lesion, or abnormal extra-axial fluid collection.  Mild mineralization within the basal ganglia and cerebellum.  No definite CT evidence for acute infarction.  The visualized paranasal sinuses and mastoid air cells are predominately clear.  IMPRESSION: No acute intracranial abnormality identified. If clinical concern for acute ischemia persists, MRI follow-up recommended.  Original Report Authenticated By: Waneta Martins, M.D.   Mr [facility] Wo Contrast  11/28/2011  *RADIOLOGY REPORT*  Clinical Data:  Stroke.  Left arm weakness  MRI HEAD WITHOUT CONTRAST MRA HEAD WITHOUT CONTRAST  Technique:  Multiplanar, multiecho pulse sequences of the brain and surrounding structures were obtained  without intravenous contrast. Angiographic images of the head were obtained using MRA technique without contrast.  Comparison:  CT 11/28/2011  MRI HEAD  Findings:  Negative for acute infarct.  No significant chronic ischemic changes.  Cerebral white matter is intact.  Brainstem and cerebellum are normal.  Negative for mass or edema.  No hemorrhage is present.  Paranasal sinuses are clear.  Vessels at the base of the brain are patent.  IMPRESSION: Negative  MRA HEAD  Findings: Both vertebral arteries  and  both PICA are patent. Basilar is patent.  Posterior cerebral and superior cerebellar arteries are patent bilaterally without stenosis.  Internal carotid artery is widely patent bilaterally.  Anterior and middle cerebral arteries are patent bilaterally with mild irregularity which may be due to artifact or atherosclerotic disease.  No critical stenosis is seen.  Negative for aneurysm.  IMPRESSION: Irregularity of the anterior middle cerebral artery branches bilaterally may be due to artifact or intracranial atherosclerotic disease.  No large vessel occlusion.  Original Report Authenticated By: Camelia Phenes, M.D.   Mr Brain Wo Contrast  11/28/2011  *RADIOLOGY REPORT*  Clinical Data:  Stroke.  Left arm weakness  MRI HEAD WITHOUT CONTRAST MRA HEAD WITHOUT CONTRAST  Technique:  Multiplanar, multiecho pulse sequences of the brain and surrounding structures were obtained without intravenous contrast. Angiographic images of the head were obtained using MRA technique without contrast.  Comparison:  CT 11/28/2011  MRI HEAD  Findings:  Negative for acute infarct.  No significant chronic ischemic changes.  Cerebral white matter is intact.  Brainstem and cerebellum are normal.  Negative for mass or edema.  No hemorrhage is present.  Paranasal sinuses are clear.  Vessels at the base of the brain are patent.  IMPRESSION: Negative  MRA HEAD  Findings: Both vertebral arteries  and  both PICA are patent. Basilar is patent.  Posterior cerebral and superior cerebellar arteries are patent bilaterally without stenosis.  Internal carotid artery is widely patent bilaterally.  Anterior and middle cerebral arteries are patent bilaterally with mild irregularity which may be due to artifact or atherosclerotic disease.  No critical stenosis is seen.  Negative for aneurysm.  IMPRESSION: Irregularity of the anterior middle cerebral artery branches bilaterally may be due to artifact or intracranial atherosclerotic disease.  No large vessel occlusion.  Original Report Authenticated By: Camelia Phenes, M.D.     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   1) MRI C-Spine, this has been ordered 2) PT/OT 3) Treatment for an isolated radial nerve palsy is conservative, ensure that patient knows to not sleep with things under her arms that could put pressure there.  4) Would  consider EMG to further evaluate as an outpatient in 3 - 4 weeks.   Ritta Slot, MD [facility] [phone] 11/28/2011, 1:39 PM

## 2011-11-28 NOTE — Progress Notes (Signed)
VASCULAR LAB PRELIMINARY  PRELIMINARY  PRELIMINARY  PRELIMINARY  Carotid duplex completed.    Preliminary report:  Bilateral:  No evidence of hemodynamically significant internal carotid artery stenosis.   Vertebral artery flow is antegrade.     [name], RVS 11/28/2011, 3:22 PM

## 2011-11-28 NOTE — Progress Notes (Signed)
Brief Nutrition Note  Reason: Nutrition Risk for unintentional weight loss > 10 lb over 1 month  Patient reported her appetite and intake have been well. Patient ate 100% of lunch meal. Patient reported she was eating well PTA. She stated she has been trying to loose weight. Patient reported she has an app on her phone to keep track of intake because she is trying to loose weight and lower cholesterol. Patient asked about high potassium foods. I have answered all of the patient's questions. She verbalized understanding. Patient is not at nutrition risk at this time.   Will monitor PO intake.   RD available for nutrition needs.   Iven Finn [facility] [phone]

## 2011-11-28 NOTE — Progress Notes (Signed)
SLP Screen Note  Pt screened today.  Pt passed RN stroke swallow screen and has intact language/speech cognition.  SLP eval not indicated.  Thanks for referral.   Donavan Burnet, MS [facility] SLP [phone]

## 2011-11-28 NOTE — Progress Notes (Signed)
*  PRELIMINARY RESULTS* Echocardiogram 2D Echocardiogram has been performed.  Jeryl Columbia 11/28/2011, 4:25 PM

## 2011-11-28 NOTE — H&P (Signed)
[name] is an 61 y.o. female.    Pcp: Renaye Rakers  Chief Complaint: weakness in left arm HPI: 61 yo female with htn, hypothyroidism apparently complains of weakness in the left arm beginning about 4:30 pm.  Left hand felt clumsy.  Had headache the day before.  Denies numbness, tingling, vision change, hearing change, cp, palp, sob, n/v, lower ext edema.   Past Medical History  Diagnosis Date  . Hypertension   . Cancer 2004    rt breast  . Hypothyroidism     Past Surgical History  Procedure Date  . Breast lumpectomy 2004  . Portacath placement 2004  . Appendectomy 2007  . Knee arthroscopy 2007  . Anterior cervical decomp/discectomy fusion 2008  . Porta cath remove 2005  . Knee arthroscopy 05/02/2011    Procedure: ARTHROSCOPY KNEE;  Surgeon: Javier Docker;  Location: [facility];  Service: Orthopedics;  Laterality: Right;  . Meniscus debridement 05/02/2011    Procedure: DEBRIDEMENT OF MENISCUS;  Surgeon: Javier Docker;  Location: [facility];  Service: Orthopedics;  Laterality: Right;  . Portacath removal     History reviewed. No pertinent family history. Social History:  reports that she has never smoked. She does not have any smokeless tobacco history on file. She reports that she drinks about 1.5 ounces of alcohol per week. She reports that she does not use illicit drugs.  Allergies:  Allergies  Allergen Reactions  . Penicillins     childhood     (Not in a hospital admission)  Results for orders placed during the hospital encounter of 11/27/11 (from the past 48 hour(s))  URINALYSIS, ROUTINE W REFLEX MICROSCOPIC     Status: Abnormal   Collection Time   11/28/11  1:07 AM      Component Value Range Comment   Color, Urine YELLOW  YELLOW    APPearance CLEAR  CLEAR    Specific Gravity, Urine 1.030  1.005 - 1.030    pH 6.5  5.0 - 8.0    Glucose, UA NEGATIVE  NEGATIVE mg/dL    Hgb urine dipstick NEGATIVE  NEGATIVE    Bilirubin Urine  NEGATIVE  NEGATIVE    Ketones, ur NEGATIVE  NEGATIVE mg/dL    Protein, ur NEGATIVE  NEGATIVE mg/dL    Urobilinogen, UA 0.2  0.0 - 1.0 mg/dL    Nitrite NEGATIVE  NEGATIVE    Leukocytes, UA SMALL (*) NEGATIVE   URINE MICROSCOPIC-ADD ON     Status: Normal   Collection Time   11/28/11  1:07 AM      Component Value Range Comment   WBC, UA 0-2  <3 WBC/hpf   CBC WITH DIFFERENTIAL     Status: Abnormal   Collection Time   11/28/11  1:15 AM      Component Value Range Comment   WBC 7.9  4.0 - 10.5 K/uL    RBC 3.91  3.87 - 5.11 MIL/uL    Hemoglobin 11.4 (*) 12.0 - 15.0 g/dL    HCT 21.3 (*) 08.6 - 46.0 %    MCV 86.7  78.0 - 100.0 fL    MCH 29.2  26.0 - 34.0 pg    MCHC 33.6  30.0 - 36.0 g/dL    RDW 57.8  46.9 - 62.9 %    Platelets 328  150 - 400 K/uL    Neutrophils Relative 48  43 - 77 %    Neutro Abs 3.8  1.7 - 7.7 K/uL  Lymphocytes Relative 44  12 - 46 %    Lymphs Abs 3.5  0.7 - 4.0 K/uL    Monocytes Relative 6  3 - 12 %    Monocytes Absolute 0.4  0.1 - 1.0 K/uL    Eosinophils Relative 1  0 - 5 %    Eosinophils Absolute 0.1  0.0 - 0.7 K/uL    Basophils Relative 0  0 - 1 %    Basophils Absolute 0.0  0.0 - 0.1 K/uL   PROTIME-INR     Status: Normal   Collection Time   11/28/11  1:15 AM      Component Value Range Comment   Prothrombin Time 12.5  11.6 - 15.2 seconds    INR 0.91  0.00 - 1.49   POCT I-STAT, CHEM 8     Status: Abnormal   Collection Time   11/28/11  1:26 AM      Component Value Range Comment   Sodium 141  135 - 145 mEq/L    Potassium 3.3 (*) 3.5 - 5.1 mEq/L    Chloride 102  96 - 112 mEq/L    BUN 22  6 - 23 mg/dL    Creatinine, Ser 1.61  0.50 - 1.10 mg/dL    Glucose, Bld 98  70 - 99 mg/dL    Calcium, Ion 0.96  0.45 - 1.30 mmol/L    TCO2 26  0 - 100 mmol/L    Hemoglobin 11.6 (*) 12.0 - 15.0 g/dL    HCT 40.9 (*) 81.1 - 46.0 %   POCT I-STAT TROPONIN I     Status: Normal   Collection Time   11/28/11  1:38 AM      Component Value Range Comment   Troponin i, poc 0.00  0.00 -  0.08 ng/mL    Comment 3             Dg Chest 2 View  11/28/2011  *RADIOLOGY REPORT*  Clinical Data: Extremity weakness.  Nonsmoker.  CHEST - 2 VIEW  Comparison: 12/30/2006  Findings: Patient has had prior cervical fusion.  Heart size is normal.  Lungs are free of focal consolidations and pleural effusions.  Shallow inflation.  Degenerative changes are seen in the spine.  IMPRESSION:  1.  Shallow inflation. 2. No evidence for acute  abnormality.  Original Report Authenticated By: Patterson Hammersmith, M.D.   Ct Head Wo Contrast  11/28/2011  *RADIOLOGY REPORT*  Clinical Data: Left arm weakness  CT HEAD WITHOUT CONTRAST  Technique:  Contiguous axial images were obtained from the base of the skull through the vertex without contrast.  Comparison: 04/24/2003  Findings: There is no evidence for acute hemorrhage, hydrocephalus, mass lesion, or abnormal extra-axial fluid collection.  Mild mineralization within the basal ganglia and cerebellum.  No definite CT evidence for acute infarction.  The visualized paranasal sinuses and mastoid air cells are predominately clear.  IMPRESSION: No acute intracranial abnormality identified. If clinical concern for acute ischemia persists, MRI follow-up recommended.  Original Report Authenticated By: Waneta Martins, M.D.    Review of Systems  Constitutional: Negative for fever, chills, weight loss, malaise/fatigue and diaphoresis.  HENT: Negative for hearing loss, ear pain, nosebleeds, congestion, neck pain, tinnitus and ear discharge.   Eyes: Negative for blurred vision, double vision, photophobia, pain, discharge and redness.  Respiratory: Negative for cough, hemoptysis, sputum production, shortness of breath, wheezing and stridor.   Cardiovascular: Negative for chest pain, palpitations, orthopnea, claudication and leg swelling.  Gastrointestinal: Negative for  heartburn, nausea, vomiting, abdominal pain, diarrhea, constipation, blood in stool and melena.  Genitourinary:  Negative for dysuria, urgency, frequency and hematuria.  Musculoskeletal: Negative for myalgias, back pain and joint pain.  Skin: Negative for itching and rash.  Neurological: Positive for focal weakness. Negative for dizziness, tingling, tremors, sensory change, speech change, seizures, loss of consciousness, weakness and headaches.  Endo/Heme/Allergies: Negative for environmental allergies and polydipsia. Does not bruise/bleed easily.  Psychiatric/Behavioral: Negative for depression, suicidal ideas, hallucinations, memory loss and substance abuse. The patient is not nervous/anxious and does not have insomnia.     Blood pressure 117/68, pulse 90, temperature 98 F (36.7 C), temperature source Oral, resp. rate 14, weight 97.126 kg (214 lb 2 oz), SpO2 97.00%. Physical Exam  Constitutional: She is oriented to person, place, and time. She appears well-developed and well-nourished. No distress.  HENT:  Head: Normocephalic and atraumatic.  Mouth/Throat: No oropharyngeal exudate.  Eyes: Conjunctivae and EOM are normal. Pupils are equal, round, and reactive to light. Right eye exhibits no discharge. Left eye exhibits no discharge. No scleral icterus.  Neck: Normal range of motion. Neck supple. No JVD present. No tracheal deviation present. No thyromegaly present.  Cardiovascular: Normal rate, regular rhythm and normal heart sounds.  Exam reveals no gallop and no friction rub.   No murmur heard. Respiratory: Effort normal and breath sounds normal. No stridor. No respiratory distress. She has no wheezes. She has no rales. She exhibits no tenderness.  GI: Soft. Bowel sounds are normal. She exhibits no distension and no mass. There is no tenderness. There is no rebound and no guarding.  Musculoskeletal: Normal range of motion. She exhibits no edema and no tenderness.  Lymphadenopathy:    She has no cervical adenopathy.  Neurological: She is alert and oriented to person, place, and time. She has normal  reflexes. She displays normal reflexes. No cranial nerve deficit. She exhibits normal muscle tone. Coordination normal.  Skin: Skin is warm and dry. No rash noted. She is not diaphoretic. No erythema. No pallor.  Psychiatric: She has a normal mood and affect. Her behavior is normal. Judgment and thought content normal.     Assessment/Plan Weakness r/o CVA Check MRI, MRA,  Carotid u/s, cardiac echo Check lipid, hga1c, homocysteine, b12 folate, esr, tsh Ecasa, Lipitor  Hypertension: cont present bp medication  Hypothyroidism: cont levothyroxine  Christyn Gutkowski 11/28/2011, 2:40 AM

## 2011-11-28 NOTE — ED Notes (Signed)
Pt with equal grips and equal smile.  Pt alert and oriented with speech clear.  Plan of care discussed with patient.  Pending testing.

## 2011-11-28 NOTE — Evaluation (Signed)
Occupational Therapy Evaluation Patient Details Name: KAYDAN WILHOITE MRN: [id number] DOB: Dec 02, 1950 Today's Date: 11/28/2011 Time: 1200-1230 OT Time Calculation (min): 30 min  OT Assessment / Plan / Recommendation Clinical Impression  Pt presents with some weakness in L wrist particularly in L wrist extensors which by her report is interfering with her ability to use her keyboard to type for her work. 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 Per clinical observation, pt is performing her basic ADL including bathing, dressing and toileting without difficulty.     OT Assessment  Patient needs continued OT Services    Follow Up Recommendations  No OT follow up;Outpatient OT (depending on progress while on acute)    Barriers to Discharge      Equipment Recommendations  None recommended by OT    Recommendations for Other Services    Frequency  Min 2X/week    Precautions / Restrictions Precautions Precautions: None        ADL  Eating/Feeding: Simulated;Independent Where Assessed - Eating/Feeding: Edge of bed Grooming: Performed;Wash/dry hands;Independent Where Assessed - Grooming: Unsupported standing Upper Body Bathing: Simulated;Chest;Right arm;Left arm;Abdomen;Independent Where Assessed - Upper Body Bathing: Unsupported sitting Lower Body Bathing: Simulated;Modified independent Where Assessed - Lower Body Bathing: Supported sit to stand Upper Body Dressing: Simulated;Independent Where Assessed - Upper Body Dressing: Unsupported sitting Lower Body Dressing: Simulated;Modified independent Where Assessed - Lower Body Dressing: Supported sit to Pharmacist, hospital: Performed;Modified independent Toilet Transfer Method: Other (comment) (ambulating) Toilet Transfer Equipment: Comfort height toilet Toileting - Clothing Manipulation and Hygiene: Performed;Modified independent Where Assessed - Toileting Clothing Manipulation and Hygiene: Sit to stand from 3-in-1  or toilet Tub/Shower Transfer Method: Not assessed ADL Comments: Pt states her L wrist is better than yesterday. She states she couldnt put on her sock yesterday but when tested today, pt able to don L sock using L hand without difficulty. She reports trying to use her keyboard and it is still difficult to use L hand the way she normally does to type though still improved from yesterday. Advised pt to ask MD about whether or not she should be driving as she had reported driving herself to the hospital when incident occurred was difficult.     OT Diagnosis: Generalized weakness  OT Problem List: Decreased strength OT Treatment Interventions: Therapeutic exercise;Patient/family education;Therapeutic activities   OT Goals Acute Rehab OT Goals OT Goal Formulation: With patient Time For Goal Achievement: 12/05/11 Potential to Achieve Goals: Good Arm Goals Additional Arm Goal #1: Pt will be independent with a HEP for theraputty/theraband exercises to strengthen L wrist for improved use in her IADLs.  Arm Goal: Additional Goal #1 - Progress: Goal set today  Visit Information  Last OT Received On: 11/28/11 Assistance Needed: +1    Subjective Data  Subjective: my hand is better today Patient Stated Goal: to be able to return to work and do for self   Prior Functioning  Vision/Perception  Home Living Lives With: Family Available Help at Discharge: Other (Comment) (daugther in law and grandson live with her and are in/out) Type of Home: Apartment Home Access: Stairs to enter Secretary/administrator of Steps: 4 Entrance Stairs-Rails: Right Home Layout: One level Bathroom Shower/Tub: Engineer, manufacturing systems: Standard Home Adaptive Equipment: None Prior Function Level of Independence: Independent Able to Take Stairs?: Yes Driving: Yes Vocation: Full time employment Comments: works split shift 9 am to 1 pm and then again 5 to 11 pm Communication Communication: No  difficulties  Dominant Hand: Right      Cognition  Overall Cognitive Status: Appears within functional limits for tasks assessed/performed Arousal/Alertness: Awake/alert Orientation Level: Appears intact for tasks assessed Behavior During Session: [facility] for tasks performed    Extremity/Trunk Assessment Right Upper Extremity Assessment RUE ROM/Strength/Tone: WFL for tasks assessed RUE Sensation: WFL - Light Touch Left Upper Extremity Assessment LUE ROM/Strength/Tone: Deficits LUE ROM/Strength/Tone Deficits: WFL at shoulder and elbow. Note weak wrist extensors 2+/5 but wrist flexors WFL for strength/ROM. Grip is good.  LUE Sensation: WFL - Light Touch LUE Coordination: WFL - gross/fine motor (able to touch each digit to thumb accurately with speed)   Mobility Bed Mobility Bed Mobility: Supine to Sit Supine to Sit: 7: Independent;HOB elevated Transfers Transfers: Sit to Stand;Stand to Sit Sit to Stand: 6: Modified independent (Device/Increase time);From bed;With upper extremity assist Stand to Sit: 6: Modified independent (Device/Increase time);To toilet;To bed;With upper extremity assist   Exercise    Balance Balance Balance Assessed: Yes Dynamic Standing Balance Dynamic Standing - Level of Assistance: 7: Independent  End of Session OT - End of Session Activity Tolerance: Patient tolerated treatment well Patient left: in bed;with call bell/phone within reach  GO     Lennox Laity [phone] 11/28/2011, 12:52 PM

## 2011-11-29 DIAGNOSIS — C50219 Malignant neoplasm of upper-inner quadrant of unspecified female breast: Secondary | ICD-10-CM

## 2011-11-29 DIAGNOSIS — R29898 Other symptoms and signs involving the musculoskeletal system: Principal | ICD-10-CM

## 2011-11-29 LAB — CBC
Hemoglobin: 11.2 g/dL — ABNORMAL LOW (ref 12.0–15.0)
MCH: 28.4 pg (ref 26.0–34.0)
MCHC: 32.7 g/dL (ref 30.0–36.0)
MCV: 86.8 fL (ref 78.0–100.0)
Platelets: 295 10*3/uL (ref 150–400)
RBC: 3.94 MIL/uL (ref 3.87–5.11)

## 2011-11-29 LAB — HOMOCYSTEINE: Homocysteine: 14.5 umol/L (ref 4.0–15.4)

## 2011-11-29 LAB — BASIC METABOLIC PANEL
CO2: 29 mEq/L (ref 19–32)
Calcium: 9.2 mg/dL (ref 8.4–10.5)
Creatinine, Ser: 0.86 mg/dL (ref 0.50–1.10)
GFR calc non Af Amer: 72 mL/min — ABNORMAL LOW (ref >90)
Glucose, Bld: 92 mg/dL (ref 70–99)
Sodium: 142 mEq/L (ref 135–145)

## 2011-11-29 LAB — HEMOGLOBIN A1C: Mean Plasma Glucose: 117 mg/dL — ABNORMAL HIGH (ref <117)

## 2011-11-29 NOTE — Discharge Summary (Signed)
Physician Discharge Summary  [name] ZOX:[id number] DOB: February 11, 1951 DOA: 11/27/2011  PCP: Geraldo Pitter, MD  Admit date: 11/27/2011 Discharge date: 11/29/2011  Recommendations for Outpatient Follow-up:  Patient to get physical therapy and follow up with Dr. Newell Coral   Discharge Diagnoses:  Principal Problem:  *LUE weakness Active Problems:  Weakness  Hypertension  Hypokalemia  Hypothyroidism   Discharge Condition: Stable  Diet recommendation: Low sodium diet  Wt Readings from Last 3 Encounters:  11/28/11 96.3 kg (212 lb 4.9 oz)  06/19/11 95.936 kg (211 lb 8 oz)  04/25/11 104.327 kg (230 lb)    History of present illness:  From original HPI: 61 yo female with htn, hypothyroidism apparently complains of weakness in the left arm beginning about 4:30 pm. Left hand felt clumsy. Had headache the day before. Denies numbness, tingling, vision change, hearing change, cp, palp, sob, n/v, lower ext edema.   Hospital Course:  Patient was evaluated here and multiple imaging studies were obtained including head CT/MRI/MRA and MRI of neck. After evaluation of results neurology recommended the following:  Plan: 1. Patient may benefit from a splint and exercises to continue on an outpatient basis.  2. Would schedule a follow up with Dr. Newell Coral as an outpatient to evaluate if patient has no further improvement.   Physical therapy recommended outpatient physical therapy and as such my care manager was consulted and home health order was placed.  Procedures:  None patient will need EMG likely as outpatient  Consultations:  Neurology  Discharge Exam: Filed Vitals:   11/29/11 1008  BP: 110/70  Pulse:   Temp:   Resp:    Filed Vitals:   11/28/11 2124 11/29/11 0218 11/29/11 0729 11/29/11 1008  BP: 114/72 97/58 98/64  110/70  Pulse: 86 77 75   Temp: 98.3 F (36.8 C) 98.1 F (36.7 C) 98 F (36.7 C)   TempSrc: Oral Oral Oral   Resp: 18 18 20    Height:      Weight:        SpO2: 97% 93% 98%     General: Pt in NAD, A and O x 3 Cardiovascular: RRR, no MRG Respiratory: CTA BL, no wheezes BL  Discharge Instructions  Discharge Orders    Future Appointments: Provider: Department: Dept Phone: Center:   06/14/2012 4:00 PM Radene Gunning [facility] [phone] None   06/21/2012 4:00 PM Lowella Dell, MD [facility] [phone] None     Future Orders Please Complete By Expires   Diet - low sodium heart healthy      Increase activity slowly      Discharge instructions      Comments:   Please follow up with Dr. Newell Coral in 2-3 weeks or sooner should any new concerns arise.  We will send you home with physical therapy as per our discussion.     Medication List  As of 11/29/2011  1:21 PM   TAKE these medications         anastrozole 1 MG tablet   Commonly known as: ARIMIDEX   TAKE 1 TABLET DAILY      levothyroxine 25 MCG tablet   Commonly known as: SYNTHROID, LEVOTHROID   Take 25 mcg by mouth daily.      TRIBENZOR 20-5-12.5 MG Tabs   Generic drug: Olmesartan-Amlodipine-HCTZ   Take 1 tablet by mouth daily.              The results of significant diagnostics from this hospitalization (including imaging, microbiology, ancillary and laboratory)  are listed below for reference.    Significant Diagnostic Studies: Dg Chest 2 View  11/28/2011  *RADIOLOGY REPORT*  Clinical Data: Extremity weakness.  Nonsmoker.  CHEST - 2 VIEW  Comparison: 12/30/2006  Findings: Patient has had prior cervical fusion.  Heart size is normal.  Lungs are free of focal consolidations and pleural effusions.  Shallow inflation.  Degenerative changes are seen in the spine.  IMPRESSION:  1.  Shallow inflation. 2. No evidence for acute  abnormality.  Original Report Authenticated By: Patterson Hammersmith, M.D.   Ct Head Wo Contrast  11/28/2011  *RADIOLOGY REPORT*  Clinical Data: Left arm weakness  CT HEAD WITHOUT CONTRAST  Technique:  Contiguous axial images were obtained from the  base of the skull through the vertex without contrast.  Comparison: 04/24/2003  Findings: There is no evidence for acute hemorrhage, hydrocephalus, mass lesion, or abnormal extra-axial fluid collection.  Mild mineralization within the basal ganglia and cerebellum.  No definite CT evidence for acute infarction.  The visualized paranasal sinuses and mastoid air cells are predominately clear.  IMPRESSION: No acute intracranial abnormality identified. If clinical concern for acute ischemia persists, MRI follow-up recommended.  Original Report Authenticated By: Waneta Martins, M.D.   Mr [facility] Wo Contrast  11/28/2011  *RADIOLOGY REPORT*  Clinical Data:  Stroke.  Left arm weakness  MRI HEAD WITHOUT CONTRAST MRA HEAD WITHOUT CONTRAST  Technique:  Multiplanar, multiecho pulse sequences of the brain and surrounding structures were obtained without intravenous contrast. Angiographic images of the head were obtained using MRA technique without contrast.  Comparison:  CT 11/28/2011  MRI HEAD  Findings:  Negative for acute infarct.  No significant chronic ischemic changes.  Cerebral white matter is intact.  Brainstem and cerebellum are normal.  Negative for mass or edema.  No hemorrhage is present.  Paranasal sinuses are clear.  Vessels at the base of the brain are patent.  IMPRESSION: Negative  MRA HEAD  Findings: Both vertebral arteries  and  both PICA are patent. Basilar is patent.  Posterior cerebral and superior cerebellar arteries are patent bilaterally without stenosis.  Internal carotid artery is widely patent bilaterally.  Anterior and middle cerebral arteries are patent bilaterally with mild irregularity which may be due to artifact or atherosclerotic disease.  No critical stenosis is seen.  Negative for aneurysm.  IMPRESSION: Irregularity of the anterior middle cerebral artery branches bilaterally may be due to artifact or intracranial atherosclerotic disease.  No large vessel occlusion.  Original Report  Authenticated By: Camelia Phenes, M.D.   Mr Brain Wo Contrast  11/28/2011  *RADIOLOGY REPORT*  Clinical Data:  Stroke.  Left arm weakness  MRI HEAD WITHOUT CONTRAST MRA HEAD WITHOUT CONTRAST  Technique:  Multiplanar, multiecho pulse sequences of the brain and surrounding structures were obtained without intravenous contrast. Angiographic images of the head were obtained using MRA technique without contrast.  Comparison:  CT 11/28/2011  MRI HEAD  Findings:  Negative for acute infarct.  No significant chronic ischemic changes.  Cerebral white matter is intact.  Brainstem and cerebellum are normal.  Negative for mass or edema.  No hemorrhage is present.  Paranasal sinuses are clear.  Vessels at the base of the brain are patent.  IMPRESSION: Negative  MRA HEAD  Findings: Both vertebral arteries  and  both PICA are patent. Basilar is patent.  Posterior cerebral and superior cerebellar arteries are patent bilaterally without stenosis.  Internal carotid artery is widely patent bilaterally.  Anterior and middle cerebral arteries  are patent bilaterally with mild irregularity which may be due to artifact or atherosclerotic disease.  No critical stenosis is seen.  Negative for aneurysm.  IMPRESSION: Irregularity of the anterior middle cerebral artery branches bilaterally may be due to artifact or intracranial atherosclerotic disease.  No large vessel occlusion.  Original Report Authenticated By: Camelia Phenes, M.D.   Mr Cervical Spine Wo Contrast  11/28/2011  *RADIOLOGY REPORT*  Clinical Data: Cervical radiculopathy.  Left arm weakness.  History of cervical spine surgery.  MRI CERVICAL SPINE WITHOUT CONTRAST  Technique:  Multiplanar and multiecho pulse sequences of the cervical spine, to include the craniocervical junction and cervicothoracic junction, were obtained according to standard protocol without intravenous contrast.  Comparison: None.  Findings: Normal signal is present in the cervical and upper thoracic spinal  cord to the lowest imaged level, T3-4.  The patient is fused anteriorly at C4-5, C5-6, and C6-7.  The craniocervical junction is within normal limits.  The visualized intracranial contents are unremarkable.  Flow is present in the major vascular structures of the neck.  An asymmetric thyroid is present with a right-sided goiter.  No dominant lesion is identified.  The soft tissues are otherwise unremarkable.  The  C2-3:  A shallow central disc protrusion is present without significant stenosis.  C3-4:  Mild broad-based disc osteophyte complex effaces the ventral CSF.  Mild left-sided facet hypertrophy is noted.  There is no significant stenosis.  C4-5:  The patient is fused.  There is no residual stenosis.  C5-6:  The patient is fused.  There is no residual stenosis.  C6-7:  The patient is fused.  Asymmetric left-sided uncovertebral disease results in moderate left foraminal stenosis.  The central canal and right foramen are patent.  C7-T1:  Slight anterolisthesis is present.  There is no significant stenosis.  IMPRESSION:  1.  Mild central canal narrowing at C3-4 with effacement of the ventral CSF secondary to a broad-based disc osteophyte complex. 2.  Status post anterior fusion at C4-5, C5-6, and C6-7 without complication. 3.  Moderate left foraminal stenosis at C6-7 secondary to asymmetric uncovertebral disease. 4.  Slight anterolisthesis at C7-T1 without significant stenosis.  Original Report Authenticated By: Jamesetta Orleans. MATTERN, M.D.   Mm Digital Screening  11/13/2011  *RADIOLOGY REPORT*  Clinical Data: Screening.  DIGITAL SCREENING MAMMOGRAM WITH CAD  Comparison:  Previous exams  Findings:  The breast tissue is heterogeneously dense. No suspicious masses, architectural distortion, or calcifications are present.  Images were processed with CAD.  IMPRESSION: No specific mammographic evidence of malignancy.  A result letter of this screening mammogram will be mailed directly to the patient.   RECOMMENDATION: Screening mammogram in one year. (Code:SM-B-01Y)  BI-RADS CATEGORY 2:  Benign finding(s).  Original Report Authenticated By: Sherian Rein, M.D.    Microbiology: No results found for this or any previous visit (from the past 240 hour(s)).   Labs: Basic Metabolic Panel:  Lab 11/29/11 1610 11/28/11 0420 11/28/11 0126 11/28/11 0105  NA 142 140 141 --  K 3.8 3.2* 3.3* --  CL 106 100 102 --  CO2 29 29 -- --  GLUCOSE 92 101* 98 --  BUN 15 19 22  --  CREATININE 0.86 0.87 1.00 --  CALCIUM 9.2 9.2 -- --  MG -- -- -- 2.5  PHOS -- -- -- --   Liver Function Tests:  Lab 11/28/11 0420  AST 16  ALT 16  ALKPHOS 93  BILITOT 0.3  PROT 7.2  ALBUMIN 3.8   No  results found for this basename: LIPASE:5,AMYLASE:5 in the last 168 hours No results found for this basename: AMMONIA:5 in the last 168 hours CBC:  Lab 11/29/11 0530 11/28/11 0126 11/28/11 0115  WBC 5.7 -- 7.9  NEUTROABS -- -- 3.8  HGB 11.2* 11.6* 11.4*  HCT 34.2* 34.0* 33.9*  MCV 86.8 -- 86.7  PLT 295 -- 328   Cardiac Enzymes: No results found for this basename: CKTOTAL:5,CKMB:5,CKMBINDEX:5,TROPONINI:5 in the last 168 hours BNP: BNP (last 3 results) No results found for this basename: PROBNP:3 in the last 8760 hours CBG: No results found for this basename: GLUCAP:5 in the last 168 hours  Time coordinating discharge: 30 minutes  Signed:  Penny Pia  [facility] 11/29/2011, 1:21 PM

## 2011-11-29 NOTE — Progress Notes (Signed)
Occupational Therapy Treatment Patient Details Name: [name] MRN: [id number] DOB: [birth year] Today's Date: 11/29/2011 Time: 4098-1191 and 1110 -1115 OT Time Calculation (min): 19 min + 5 = 24  OT Assessment / Plan / Recommendation Comments on Treatment Session Pt will follow up with Dr Jule Ser.  HEP given, and pt is independent.   No further OT needed at this time, but Dr. Jule Ser may recommend OP OT later if needed    Follow Up Recommendations  No OT follow up    Barriers to Discharge       Equipment Recommendations  None recommended by PT    Recommendations for Other Services    Frequency     Plan      Precautions / Restrictions Precautions Precautions: None Restrictions Weight Bearing Restrictions: No   Pertinent Vitals/Pain No pain but feels stretch during shoulder AROM    ADL  ADL Comments: reviewed HEP.  shoulder stretches flexion and abduction.  Wrist strengthening in gravity eliminated plane with arm supported to eliminate overuse of other muscles.  Theraputty for wrist and finger extension.  Pt is able to complete all adls and use ipad.  During use, wrist is often in neutral position.  During stretch, wrist flexes past neutral    OT Diagnosis:    OT Problem List:   OT Treatment Interventions:     OT Goals Arm Goals Additional Arm Goal #1: Pt will be independent with a HEP for theraputty/theraband exercises to strengthen L wrist for improved use in her IADLs.  Arm Goal: Additional Goal #1 - Progress: Met  Visit Information  Last OT Received On: 11/29/11 Assistance Needed: +1    Subjective Data      Prior Functioning       Cognition  Overall Cognitive Status: Appears within functional limits for tasks assessed/performed Behavior During Session: [facility] for tasks performed    Mobility     Exercises    Balance    End of Session OT - End of Session Activity Tolerance: Patient tolerated treatment well Patient left: in bed;with call bell/phone  within reach  GO     [facility] 11/29/2011, 11:21 AM Marica Otter, OTR/L [phone] 11/29/2011

## 2011-11-29 NOTE — Progress Notes (Signed)
Subjective: Patient reports that she feels some better today.  Still has weakness of the hand at the wrist.  MRI of the cervical spine has been performed and shows some mild canal narrowing at C3-4 and left foraminal stenosis at C6-7.  Evidence of previous surgery is seen as well.  Do not see evidence of a pathology that would be causing current symptoms.    Objective: Current vital signs: BP 98/64  Pulse 75  Temp 98 F (36.7 C) (Oral)  Resp 20  Ht 5\' 4"  (1.626 m)  Wt 96.3 kg (212 lb 4.9 oz)  BMI 36.44 kg/m2  SpO2 98% Vital signs in last 24 hours: Temp:  [98 F (36.7 C)-98.3 F (36.8 C)] 98 F (36.7 C) (08/03 0729) Pulse Rate:  [75-86] 75  (08/03 0729) Resp:  [18-20] 20  (08/03 0729) BP: (97-135)/(58-82) 98/64 mmHg (08/03 0729) SpO2:  [93 %-98 %] 98 % (08/03 0729)  Intake/Output from previous day: 08/02 0701 - 08/03 0700 In: 240 [P.O.:240] Out: 400 [Urine:400] Intake/Output this shift: Total I/O In: 120 [P.O.:120] Out: 75 [Urine:75] Nutritional status: Cardiac  Neurologic Exam: Mental Status: Awake, Alert, Oriented to person, place, and situation, immediate and remote memory are intact, able to give a clear and coherent history  Cranial Nerves:  II- Visual fields full  II/IV/VI-EOMI  V/VII-facial movements and sensation are intact  VIII-hearing intact to voice  IX/X-uvula elevates midline  XI-shoulder shrug symmetric.  XII-tongue midline.  Motor:  5/5 on the right.  In the LUE 5/5 at the deltoids, triceps, biceps and wrist flexors.  Wrist extensor 4-/5.  Hand intrinsics 4/5.  Wrist inversion 4/5, eversion 5/5  Legs have full strength throughout.  Sensory: intact to light touch, temperature and vibration.  DTR's:1+in the upper extremities and 2+ at the knees   Lab Results: Basic Metabolic Panel:  Lab 11/29/11 9562 11/28/11 0420 11/28/11 0126 11/28/11 0105  NA 142 140 141 --  K 3.8 3.2* 3.3* --  CL 106 100 102 --  CO2 29 29 -- --  GLUCOSE 92 101* 98 --  BUN  15 19 22  --  CREATININE 0.86 0.87 1.00 --  CALCIUM 9.2 9.2 -- --  MG -- -- -- 2.5  PHOS -- -- -- --    Liver Function Tests:  Lab 11/28/11 0420  AST 16  ALT 16  ALKPHOS 93  BILITOT 0.3  PROT 7.2  ALBUMIN 3.8   No results found for this basename: LIPASE:5,AMYLASE:5 in the last 168 hours No results found for this basename: AMMONIA:3 in the last 168 hours  CBC:  Lab 11/29/11 0530 11/28/11 0126 11/28/11 0115  WBC 5.7 -- 7.9  NEUTROABS -- -- 3.8  HGB 11.2* 11.6* 11.4*  HCT 34.2* 34.0* 33.9*  MCV 86.8 -- 86.7  PLT 295 -- 328    Cardiac Enzymes: No results found for this basename: CKTOTAL:5,CKMB:5,CKMBINDEX:5,TROPONINI:5 in the last 168 hours  Lipid Panel:  Lab 11/28/11 0421  CHOL 203*  TRIG 75  HDL 54  CHOLHDL 3.8  VLDL 15  LDLCALC 130*    CBG: No results found for this basename: GLUCAP:5 in the last 168 hours  Microbiology: No results found for this or any previous visit.  Coagulation Studies:  Basename 11/28/11 0115  LABPROT 12.5  INR 0.91    Imaging: Dg Chest 2 View  11/28/2011  *RADIOLOGY REPORT*  Clinical Data: Extremity weakness.  Nonsmoker.  CHEST - 2 VIEW  Comparison: 12/30/2006  Findings: Patient has had prior cervical fusion.  Heart  size is normal.  Lungs are free of focal consolidations and pleural effusions.  Shallow inflation.  Degenerative changes are seen in the spine.  IMPRESSION:  1.  Shallow inflation. 2. No evidence for acute  abnormality.  Original Report Authenticated By: Patterson Hammersmith, M.D.   Ct Head Wo Contrast  11/28/2011  *RADIOLOGY REPORT*  Clinical Data: Left arm weakness  CT HEAD WITHOUT CONTRAST  Technique:  Contiguous axial images were obtained from the base of the skull through the vertex without contrast.  Comparison: 04/24/2003  Findings: There is no evidence for acute hemorrhage, hydrocephalus, mass lesion, or abnormal extra-axial fluid collection.  Mild mineralization within the basal ganglia and cerebellum.  No definite  CT evidence for acute infarction.  The visualized paranasal sinuses and mastoid air cells are predominately clear.  IMPRESSION: No acute intracranial abnormality identified. If clinical concern for acute ischemia persists, MRI follow-up recommended.  Original Report Authenticated By: Waneta Martins, M.D.   Mr [facility] Wo Contrast  11/28/2011  *RADIOLOGY REPORT*  Clinical Data:  Stroke.  Left arm weakness  MRI HEAD WITHOUT CONTRAST MRA HEAD WITHOUT CONTRAST  Technique:  Multiplanar, multiecho pulse sequences of the brain and surrounding structures were obtained without intravenous contrast. Angiographic images of the head were obtained using MRA technique without contrast.  Comparison:  CT 11/28/2011  MRI HEAD  Findings:  Negative for acute infarct.  No significant chronic ischemic changes.  Cerebral white matter is intact.  Brainstem and cerebellum are normal.  Negative for mass or edema.  No hemorrhage is present.  Paranasal sinuses are clear.  Vessels at the base of the brain are patent.  IMPRESSION: Negative  MRA HEAD  Findings: Both vertebral arteries  and  both PICA are patent. Basilar is patent.  Posterior cerebral and superior cerebellar arteries are patent bilaterally without stenosis.  Internal carotid artery is widely patent bilaterally.  Anterior and middle cerebral arteries are patent bilaterally with mild irregularity which may be due to artifact or atherosclerotic disease.  No critical stenosis is seen.  Negative for aneurysm.  IMPRESSION: Irregularity of the anterior middle cerebral artery branches bilaterally may be due to artifact or intracranial atherosclerotic disease.  No large vessel occlusion.  Original Report Authenticated By: Camelia Phenes, M.D.   Mr Brain Wo Contrast  11/28/2011  *RADIOLOGY REPORT*  Clinical Data:  Stroke.  Left arm weakness  MRI HEAD WITHOUT CONTRAST MRA HEAD WITHOUT CONTRAST  Technique:  Multiplanar, multiecho pulse sequences of the brain and surrounding  structures were obtained without intravenous contrast. Angiographic images of the head were obtained using MRA technique without contrast.  Comparison:  CT 11/28/2011  MRI HEAD  Findings:  Negative for acute infarct.  No significant chronic ischemic changes.  Cerebral white matter is intact.  Brainstem and cerebellum are normal.  Negative for mass or edema.  No hemorrhage is present.  Paranasal sinuses are clear.  Vessels at the base of the brain are patent.  IMPRESSION: Negative  MRA HEAD  Findings: Both vertebral arteries  and  both PICA are patent. Basilar is patent.  Posterior cerebral and superior cerebellar arteries are patent bilaterally without stenosis.  Internal carotid artery is widely patent bilaterally.  Anterior and middle cerebral arteries are patent bilaterally with mild irregularity which may be due to artifact or atherosclerotic disease.  No critical stenosis is seen.  Negative for aneurysm.  IMPRESSION: Irregularity of the anterior middle cerebral artery branches bilaterally may be due to artifact or intracranial atherosclerotic disease.  No large vessel occlusion.  Original Report Authenticated By: Camelia Phenes, M.D.   Mr Cervical Spine Wo Contrast  11/28/2011  *RADIOLOGY REPORT*  Clinical Data: Cervical radiculopathy.  Left arm weakness.  History of cervical spine surgery.  MRI CERVICAL SPINE WITHOUT CONTRAST  Technique:  Multiplanar and multiecho pulse sequences of the cervical spine, to include the craniocervical junction and cervicothoracic junction, were obtained according to standard protocol without intravenous contrast.  Comparison: None.  Findings: Normal signal is present in the cervical and upper thoracic spinal cord to the lowest imaged level, T3-4.  The patient is fused anteriorly at C4-5, C5-6, and C6-7.  The craniocervical junction is within normal limits.  The visualized intracranial contents are unremarkable.  Flow is present in the major vascular structures of the neck.  An  asymmetric thyroid is present with a right-sided goiter.  No dominant lesion is identified.  The soft tissues are otherwise unremarkable.  The  C2-3:  A shallow central disc protrusion is present without significant stenosis.  C3-4:  Mild broad-based disc osteophyte complex effaces the ventral CSF.  Mild left-sided facet hypertrophy is noted.  There is no significant stenosis.  C4-5:  The patient is fused.  There is no residual stenosis.  C5-6:  The patient is fused.  There is no residual stenosis.  C6-7:  The patient is fused.  Asymmetric left-sided uncovertebral disease results in moderate left foraminal stenosis.  The central canal and right foramen are patent.  C7-T1:  Slight anterolisthesis is present.  There is no significant stenosis.  IMPRESSION:  1.  Mild central canal narrowing at C3-4 with effacement of the ventral CSF secondary to a broad-based disc osteophyte complex. 2.  Status post anterior fusion at C4-5, C5-6, and C6-7 without complication. 3.  Moderate left foraminal stenosis at C6-7 secondary to asymmetric uncovertebral disease. 4.  Slight anterolisthesis at C7-T1 without significant stenosis.  Original Report Authenticated By: Jamesetta Orleans. MATTERN, M.D.    Medications:  I have reviewed the patient's current medications. Scheduled:   . olmesartan  20 mg Oral Daily   And  . amLODipine  5 mg Oral Daily   And  . hydrochlorothiazide  12.5 mg Oral Daily  . anastrozole  1 mg Oral Daily  . aspirin  300 mg Rectal Daily   Or  . aspirin  325 mg Oral Daily  . atorvastatin  40 mg Oral q1800  . levothyroxine  25 mcg Oral Daily  . potassium chloride  40 mEq Oral Q4H    Assessment/Plan:  Patient Active Hospital Problem List: LUE weakness (11/28/2011)   Assessment: Patient with right wrist drop.  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  MRI/A of the brain is normal.  MRI of the cervical spine does not suggest a cervical etiology.  ESR slightly elevated.  TSH, HgbA1c, B12 normal.   Patient with a history of hypothyroidism.     Plan:  1.  Patient may benefit from a splint and exercises to continue on an outpatient basis.  2.  Would schedule a follow up with Dr. Newell Coral as an outpatient to evaluate if patient has no further improvement.      LOS: 2 days   Thana Farr, MD [facility] [phone] 11/29/2011  9:55 AM

## 2011-11-29 NOTE — Progress Notes (Signed)
Cm spoke with patient concerning dc planning. MD order for HHPT. Per pt choice [facility] to provide [facility] services upon discharge. No DME requested. [facility] notified of referral. Demographics, H/P, MD order faxed to [facility] at [phone]. Patient to follow up with Pcp: Renaye Rakers. No other needs specified at this time.    Leonie Green [phone]

## 2011-12-01 LAB — CARDIOLIPIN ANTIBODIES, IGG, IGM, IGA: Anticardiolipin IgA: 5 APL U/mL — ABNORMAL LOW (ref <22)

## 2011-12-01 LAB — LUPUS ANTICOAGULANT PANEL
DRVVT: 33.3 secs (ref <45.1)
Lupus Anticoagulant: NOT DETECTED

## 2011-12-01 LAB — FOLATE RBC: RBC Folate: 244 ng/mL — ABNORMAL LOW (ref >=366)

## 2011-12-01 LAB — BETA-2-GLYCOPROTEIN I ABS, IGG/M/A
Beta-2 Glyco I IgG: 0 G Units (ref <20)
Beta-2-Glycoprotein I IgM: 8 M Units (ref <20)

## 2011-12-01 LAB — PROTEIN C ACTIVITY: Protein C Activity: 190 % — ABNORMAL HIGH (ref 75–133)

## 2011-12-01 LAB — PROTEIN C, TOTAL: Protein C, Total: 148 % (ref 72–160)

## 2011-12-01 LAB — PROTEIN S, TOTAL: Protein S Ag, Total: 105 % (ref 60–150)

## 2012-04-05 ENCOUNTER — Encounter (HOSPITAL_COMMUNITY): Payer: Self-pay | Admitting: Pharmacy Technician

## 2012-04-07 ENCOUNTER — Encounter (HOSPITAL_COMMUNITY)
Admission: RE | Admit: 2012-04-07 | Discharge: 2012-04-07 | Disposition: A | Discharge status: Home / Self Care | Payer: 59 | Source: Ambulatory Visit | Attending: Orthopedic Surgery | Admitting: Orthopedic Surgery

## 2012-04-07 ENCOUNTER — Encounter (HOSPITAL_COMMUNITY): Payer: Self-pay

## 2012-04-07 HISTORY — DX: Pain in unspecified joint: M25.50

## 2012-04-07 HISTORY — DX: Sleep apnea, unspecified: G47.30

## 2012-04-07 HISTORY — DX: Unspecified osteoarthritis, unspecified site: M19.90

## 2012-04-07 HISTORY — DX: Spontaneous ecchymoses: R23.3

## 2012-04-07 HISTORY — DX: Effusion, unspecified joint: M25.40

## 2012-04-07 HISTORY — DX: Other skin changes: R23.8

## 2012-04-07 LAB — BASIC METABOLIC PANEL
BUN: 9 mg/dL (ref 6–23)
GFR calc non Af Amer: 75 mL/min — ABNORMAL LOW (ref >90)
Glucose, Bld: 96 mg/dL (ref 70–99)
Potassium: 3.3 mEq/L — ABNORMAL LOW (ref 3.5–5.1)

## 2012-04-07 LAB — CBC
HCT: 39.1 % (ref 36.0–46.0)
Hemoglobin: 13.1 g/dL (ref 12.0–15.0)
MCH: 28.7 pg (ref 26.0–34.0)
MCHC: 33.5 g/dL (ref 30.0–36.0)
MCV: 85.7 fL (ref 78.0–100.0)

## 2012-04-07 LAB — SURGICAL PCR SCREEN
MRSA, PCR: NEGATIVE
Staphylococcus aureus: NEGATIVE

## 2012-04-07 LAB — TYPE AND SCREEN: Antibody Screen: NEGATIVE

## 2012-04-07 LAB — APTT: aPTT: 33 seconds (ref 24–37)

## 2012-04-07 LAB — ABO/RH: ABO/RH(D): A POS

## 2012-04-07 NOTE — Pre-Procedure Instructions (Signed)
20 [name]  04/07/2012   Your procedure is scheduled on:  Wed, Dec 18 @ 10:30 AM  Report to [facility] at 7:30 AM.  Call this number if you have problems the morning of surgery: [phone]   Remember:   Do not eat food:After Midnight.  Take these medicines the morning of surgery with A SIP OF WATER: Synthroid(Levothyroxine)   Do not wear jewelry, make-up or nail polish.  Do not wear lotions, powders, or perfumes. You may wear deodorant.  Do not shave 48 hours prior to surgery.   Do not bring valuables to the hospital.  Contacts, dentures or bridgework may not be worn into surgery.  Leave suitcase in the car. After surgery it may be brought to your room.  For patients admitted to the hospital, checkout time is 11:00 AM the day of discharge.   Patients discharged the day of surgery will not be allowed to drive home.    Special Instructions: Shower using CHG 2 nights before surgery and the night before surgery.  If you shower the day of surgery use CHG.  Use special wash - you have one bottle of CHG for all showers.  You should use approximately 1/3 of the bottle for each shower.   Please read over the following fact sheets that you were given: Pain Booklet, Coughing and Deep Breathing, Blood Transfusion Information, Total Joint Packet, MRSA Information and Surgical Site Infection Prevention

## 2012-04-07 NOTE — Progress Notes (Addendum)
Pt doesn't have a cardiologist  Echo in epic from 2004 and 2013 Denies stress test or heart cath  Dr.Veita Parke Simmers is medical Md  EKG in epic from 11/2011 CXR in epic from 11/2011  Sleep Study to be requested from Dr.Bland along with ekg done about a month ago

## 2012-04-12 NOTE — Progress Notes (Signed)
Spoke to Dr. Greig Right scheduler that we do not have orders. She stated she will send message to the pa. Called Dr. Tedra Senegal office requesting ekg and sleep study.

## 2012-04-13 MED ORDER — VANCOMYCIN HCL IN DEXTROSE 1-5 GM/200ML-% IV SOLN
1000.0000 mg | INTRAVENOUS | Status: AC
  Administered 2012-04-14: 1000 mg via INTRAVENOUS
  Filled 2012-04-13: qty 200

## 2012-04-13 NOTE — H&P (Signed)
  [facility] [address]   [address] [place], [place] [address] [phone] A Division of [facility]  Loreta Ave, M.D.   Robert A. Thurston Hole, M.D.   Burnell Blanks, M.D.   Eulas Post, M.D.   Lunette Stands, M.D Buford Dresser, M.D.  Charlsie Quest, M.D.   Estell Harpin, M.D.   Melina Fiddler, M.D. Genene Churn. Barry Dienes, PA-C            Kirstin A. Shepperson, PA-C [place], PA-C [place], [place]   RE: [name], [name]                                [id number]      DOB: 08-11-1950 PROGRESS NOTE: 04-02-12 Chief complaint: Right knee pain.  History of present illness: 61 year-old black female with a history of end stage DJD, right knee, and chronic pain.  Returns.  States that knee symptoms are unchanged from previous visit.  She is wanting to proceed with right total knee replacement as scheduled.  Patient states that over the last few days she has been trying to get over a cold.  No complaints of fevers, chills or cough.  She has not seen her primary care physician, Dr. Parke Simmers, yet for this.   Current medications: Tribenzor, Anastrozole and Synthroid. Allergies: No known drug allergies. Past medical/surgical history: Knee arthroscopy, neck surgery, appendectomy, hypothyroidism and hypertension.  Review of systems: Patient currently denies lightheadedness, dizziness, fevers or chills, cardiac, pulmonary, GI or GU issues. Family history: Positive for hypertension, diabetes, stroke and cancer. Social history: Denies smoking.  Admits occasional alcohol use.  Patient lives alone.     EXAMINATION: Height: 5?4.  Weight: 219 pounds.  Blood pressure: 144/86.  Pulse: 96.  Temperature: 98.7. Pleasant black female, alert and oriented x 3 and in no acute distress.  No increase in respiratory effort.  Head is normocephalic, a traumatic.  PERRLA, EOMI.  Lungs: CTA bilaterally.  No wheezes.  Heart: RRR.  No murmurs.  Abdomen:  Round and non-distended.  NBS x 4.  Soft and non-tender.  Right knee: Decreased range of motion. Positive crepitus.  Joint line tender.  Positive effusion.  Ligaments stable.  Calf non-tender.  Neurovascularly intact.  Skin warm and dry.    X-RAYS: Previous films of the right knee show end stage DJD with periarticular spurs.    IMPRESSION: End stage DJD, right knee, and chronic pain.  Failed conservative treatment.  PLAN: We will proceed with right total knee replacement as scheduled.  Surgical procedure, along with potential rehab/recovery time discussed.  We did discuss going to a rehab center post-op since she does live alone and will not have any assistance available to her.  All questions answered.  Loreta Ave, M.D.   Electronically verified by Loreta Ave, M.D. DFM(JMO):jjh D 04-02-12 T 04-06-12

## 2012-04-14 ENCOUNTER — Inpatient Hospital Stay (HOSPITAL_COMMUNITY): Payer: 59

## 2012-04-14 ENCOUNTER — Inpatient Hospital Stay (HOSPITAL_COMMUNITY)
Admission: RE | Admit: 2012-04-14 | Discharge: 2012-04-16 | DRG: 470 | Disposition: A | Discharge status: Skilled Nursing Facility | Payer: 59 | Source: Ambulatory Visit | Attending: Orthopedic Surgery | Admitting: Orthopedic Surgery

## 2012-04-14 ENCOUNTER — Encounter (HOSPITAL_COMMUNITY)
Admission: RE | Disposition: A | Discharge status: Skilled Nursing Facility | Payer: Self-pay | Source: Ambulatory Visit | Attending: Orthopedic Surgery

## 2012-04-14 ENCOUNTER — Encounter (HOSPITAL_COMMUNITY): Payer: Self-pay | Admitting: *Deleted

## 2012-04-14 ENCOUNTER — Inpatient Hospital Stay (HOSPITAL_COMMUNITY): Payer: 59 | Admitting: Anesthesiology

## 2012-04-14 ENCOUNTER — Encounter (HOSPITAL_COMMUNITY): Payer: Self-pay | Admitting: Anesthesiology

## 2012-04-14 DIAGNOSIS — E039 Hypothyroidism, unspecified: Secondary | ICD-10-CM | POA: Diagnosis present

## 2012-04-14 DIAGNOSIS — E876 Hypokalemia: Secondary | ICD-10-CM | POA: Diagnosis not present

## 2012-04-14 DIAGNOSIS — I10 Essential (primary) hypertension: Secondary | ICD-10-CM | POA: Diagnosis present

## 2012-04-14 DIAGNOSIS — Z01812 Encounter for preprocedural laboratory examination: Secondary | ICD-10-CM

## 2012-04-14 DIAGNOSIS — Z96659 Presence of unspecified artificial knee joint: Secondary | ICD-10-CM

## 2012-04-14 DIAGNOSIS — M1710 Unilateral primary osteoarthritis, unspecified knee: Principal | ICD-10-CM | POA: Diagnosis present

## 2012-04-14 HISTORY — PX: TOTAL KNEE ARTHROPLASTY: SHX125

## 2012-04-14 LAB — COMPREHENSIVE METABOLIC PANEL
ALT: 18 U/L (ref 0–35)
AST: 18 U/L (ref 0–37)
Albumin: 4.1 g/dL (ref 3.5–5.2)
CO2: 29 mEq/L (ref 19–32)
Calcium: 9.8 mg/dL (ref 8.4–10.5)
Sodium: 140 mEq/L (ref 135–145)
Total Protein: 8.1 g/dL (ref 6.0–8.3)

## 2012-04-14 LAB — URINALYSIS, ROUTINE W REFLEX MICROSCOPIC
Glucose, UA: NEGATIVE mg/dL
Nitrite: NEGATIVE
Protein, ur: 30 mg/dL — AB
Urobilinogen, UA: 1 mg/dL (ref 0.0–1.0)

## 2012-04-14 LAB — URINE MICROSCOPIC-ADD ON

## 2012-04-14 SURGERY — ARTHROPLASTY, KNEE, TOTAL
Anesthesia: General | Site: Knee | Laterality: Right | Wound class: Clean

## 2012-04-14 MED ORDER — PHENOL 1.4 % MT LIQD: 1.0000 | OROMUCOSAL | Status: DC | PRN

## 2012-04-14 MED ORDER — VANCOMYCIN HCL IN DEXTROSE 1-5 GM/200ML-% IV SOLN
1000.0000 mg | Freq: Two times a day (BID) | INTRAVENOUS | Status: AC
  Administered 2012-04-14 – 2012-04-15 (×2): 1000 mg via INTRAVENOUS
  Filled 2012-04-14 (×2): qty 200

## 2012-04-14 MED ORDER — SENNA 8.6 MG PO TABS
1.0000 | ORAL_TABLET | Freq: Two times a day (BID) | ORAL | Status: DC
  Administered 2012-04-14 – 2012-04-16 (×4): 8.6 mg via ORAL
  Filled 2012-04-14 (×5): qty 1

## 2012-04-14 MED ORDER — HYDROMORPHONE HCL PF 1 MG/ML IJ SOLN: 1.0000 mg | INTRAMUSCULAR | Status: DC | PRN

## 2012-04-14 MED ORDER — HYDROCODONE-ACETAMINOPHEN 7.5-325 MG PO TABS
1.0000 | ORAL_TABLET | ORAL | Status: DC | PRN
  Administered 2012-04-14 – 2012-04-15 (×2): 2 via ORAL
  Administered 2012-04-15 – 2012-04-16 (×2): 1 via ORAL
  Filled 2012-04-14 (×2): qty 1
  Filled 2012-04-14 (×2): qty 2

## 2012-04-14 MED ORDER — PROMETHAZINE HCL 25 MG/ML IJ SOLN: 6.2500 mg | INTRAMUSCULAR | Status: DC | PRN

## 2012-04-14 MED ORDER — WARFARIN VIDEO: Freq: Once | Status: DC

## 2012-04-14 MED ORDER — METHOCARBAMOL 100 MG/ML IJ SOLN
500.0000 mg | Freq: Four times a day (QID) | INTRAVENOUS | Status: DC | PRN
  Administered 2012-04-14: 500 mg via INTRAVENOUS
  Filled 2012-04-14: qty 5

## 2012-04-14 MED ORDER — FENTANYL CITRATE 0.05 MG/ML IJ SOLN
50.0000 ug | Freq: Once | INTRAMUSCULAR | Status: AC
  Administered 2012-04-14: 100 ug via INTRAVENOUS

## 2012-04-14 MED ORDER — FENTANYL CITRATE 0.05 MG/ML IJ SOLN
INTRAMUSCULAR | Status: AC
  Filled 2012-04-14: qty 2

## 2012-04-14 MED ORDER — ANASTROZOLE 1 MG PO TABS
1.0000 mg | ORAL_TABLET | Freq: Every day | ORAL | Status: DC
Start: 2012-04-14 — End: 2012-04-16
  Administered 2012-04-14 – 2012-04-16 (×3): 1 mg via ORAL
  Filled 2012-04-14 (×3): qty 1

## 2012-04-14 MED ORDER — NEOSTIGMINE METHYLSULFATE 1 MG/ML IJ SOLN
INTRAMUSCULAR | Status: DC | PRN
  Administered 2012-04-14: 4 mg via INTRAVENOUS

## 2012-04-14 MED ORDER — HYDROMORPHONE HCL PF 1 MG/ML IJ SOLN
INTRAMUSCULAR | Status: DC | PRN
  Administered 2012-04-14: .5 mg via INTRAVENOUS

## 2012-04-14 MED ORDER — WARFARIN SODIUM 7.5 MG PO TABS
7.5000 mg | ORAL_TABLET | Freq: Once | ORAL | Status: AC
  Administered 2012-04-14: 7.5 mg via ORAL
  Filled 2012-04-14 (×3): qty 1

## 2012-04-14 MED ORDER — HYDROCHLOROTHIAZIDE 25 MG PO TABS
25.0000 mg | ORAL_TABLET | Freq: Every day | ORAL | Status: DC
  Filled 2012-04-14 (×2): qty 1

## 2012-04-14 MED ORDER — ONDANSETRON HCL 4 MG/2ML IJ SOLN
INTRAMUSCULAR | Status: DC | PRN
  Administered 2012-04-14: 4 mg via INTRAVENOUS

## 2012-04-14 MED ORDER — SODIUM CHLORIDE 0.9 % IR SOLN
Status: DC | PRN
  Administered 2012-04-14: 1000 mL
  Administered 2012-04-14: 3000 mL

## 2012-04-14 MED ORDER — ENOXAPARIN SODIUM 30 MG/0.3ML ~~LOC~~ SOLN
30.0000 mg | Freq: Two times a day (BID) | SUBCUTANEOUS | Status: DC
  Administered 2012-04-15 – 2012-04-16 (×3): 30 mg via SUBCUTANEOUS
  Filled 2012-04-14 (×5): qty 0.3

## 2012-04-14 MED ORDER — METOCLOPRAMIDE HCL 10 MG PO TABS: 5.0000 mg | ORAL_TABLET | Freq: Three times a day (TID) | ORAL | Status: DC | PRN

## 2012-04-14 MED ORDER — HYDROMORPHONE HCL PF 1 MG/ML IJ SOLN
0.2500 mg | INTRAMUSCULAR | Status: DC | PRN
  Administered 2012-04-14 (×2): 0.5 mg via INTRAVENOUS

## 2012-04-14 MED ORDER — ACETAMINOPHEN 650 MG RE SUPP: 650.0000 mg | Freq: Four times a day (QID) | RECTAL | Status: DC | PRN

## 2012-04-14 MED ORDER — POTASSIUM CHLORIDE 10 MEQ/100ML IV SOLN
10.0000 meq | INTRAVENOUS | Status: DC
  Filled 2012-04-14 (×2): qty 100

## 2012-04-14 MED ORDER — HYDROMORPHONE HCL PF 1 MG/ML IJ SOLN
INTRAMUSCULAR | Status: AC
  Administered 2012-04-14: 0.5 mg via INTRAVENOUS
  Filled 2012-04-14: qty 1

## 2012-04-14 MED ORDER — POTASSIUM CHLORIDE IN NACL 20-0.9 MEQ/L-% IV SOLN
INTRAVENOUS | Status: DC
  Filled 2012-04-14 (×6): qty 1000

## 2012-04-14 MED ORDER — PATIENT'S GUIDE TO USING COUMADIN BOOK
Freq: Once | Status: DC
  Filled 2012-04-14: qty 1

## 2012-04-14 MED ORDER — FENTANYL CITRATE 0.05 MG/ML IJ SOLN
INTRAMUSCULAR | Status: DC | PRN
  Administered 2012-04-14: 100 ug via INTRAVENOUS

## 2012-04-14 MED ORDER — DOCUSATE SODIUM 100 MG PO CAPS
100.0000 mg | ORAL_CAPSULE | Freq: Two times a day (BID) | ORAL | Status: DC
  Administered 2012-04-14 – 2012-04-16 (×4): 100 mg via ORAL
  Filled 2012-04-14 (×5): qty 1

## 2012-04-14 MED ORDER — LIDOCAINE HCL (CARDIAC) 20 MG/ML IV SOLN
INTRAVENOUS | Status: DC | PRN
  Administered 2012-04-14: 80 mg via INTRAVENOUS

## 2012-04-14 MED ORDER — METHOCARBAMOL 500 MG PO TABS
500.0000 mg | ORAL_TABLET | Freq: Four times a day (QID) | ORAL | Status: DC | PRN
  Administered 2012-04-15 – 2012-04-16 (×2): 500 mg via ORAL
  Filled 2012-04-14 (×3): qty 1

## 2012-04-14 MED ORDER — ONDANSETRON HCL 4 MG/2ML IJ SOLN
4.0000 mg | Freq: Four times a day (QID) | INTRAMUSCULAR | Status: DC | PRN
  Administered 2012-04-15: 4 mg via INTRAVENOUS
  Filled 2012-04-14: qty 2

## 2012-04-14 MED ORDER — IRBESARTAN 300 MG PO TABS
300.0000 mg | ORAL_TABLET | Freq: Every day | ORAL | Status: DC
  Administered 2012-04-15 – 2012-04-16 (×2): 300 mg via ORAL
  Filled 2012-04-14 (×2): qty 1

## 2012-04-14 MED ORDER — AMLODIPINE BESYLATE 10 MG PO TABS
10.0000 mg | ORAL_TABLET | Freq: Every day | ORAL | Status: DC
  Administered 2012-04-15 – 2012-04-16 (×2): 10 mg via ORAL
  Filled 2012-04-14 (×2): qty 1

## 2012-04-14 MED ORDER — ACETAMINOPHEN 325 MG PO TABS: 650.0000 mg | ORAL_TABLET | Freq: Four times a day (QID) | ORAL | Status: DC | PRN

## 2012-04-14 MED ORDER — PHENYLEPHRINE HCL 10 MG/ML IJ SOLN
INTRAMUSCULAR | Status: DC | PRN
  Administered 2012-04-14: 80 ug via INTRAVENOUS

## 2012-04-14 MED ORDER — METOCLOPRAMIDE HCL 5 MG/ML IJ SOLN: 5.0000 mg | Freq: Three times a day (TID) | INTRAMUSCULAR | Status: DC | PRN

## 2012-04-14 MED ORDER — SODIUM CHLORIDE 0.9 % IV SOLN
INTRAVENOUS | Status: DC | PRN
  Administered 2012-04-14: 40 mL via INTRAMUSCULAR

## 2012-04-14 MED ORDER — ONDANSETRON HCL 4 MG PO TABS: 4.0000 mg | ORAL_TABLET | Freq: Four times a day (QID) | ORAL | Status: DC | PRN

## 2012-04-14 MED ORDER — MENTHOL 3 MG MT LOZG: 1.0000 | LOZENGE | OROMUCOSAL | Status: DC | PRN

## 2012-04-14 MED ORDER — LEVOTHYROXINE SODIUM 50 MCG PO TABS
50.0000 ug | ORAL_TABLET | Freq: Every day | ORAL | Status: DC
  Administered 2012-04-15 – 2012-04-16 (×2): 50 ug via ORAL
  Filled 2012-04-14 (×3): qty 1

## 2012-04-14 MED ORDER — ARTIFICIAL TEARS OP OINT
TOPICAL_OINTMENT | OPHTHALMIC | Status: DC | PRN
  Administered 2012-04-14: 1 via OPHTHALMIC

## 2012-04-14 MED ORDER — OXYCODONE HCL 5 MG/5ML PO SOLN: 5.0000 mg | Freq: Once | ORAL | Status: DC | PRN

## 2012-04-14 MED ORDER — ALUM & MAG HYDROXIDE-SIMETH 200-200-20 MG/5ML PO SUSP: 30.0000 mL | ORAL | Status: DC | PRN

## 2012-04-14 MED ORDER — SODIUM CHLORIDE 0.9 % IJ SOLN
INTRAMUSCULAR | Status: AC
  Filled 2012-04-14: qty 6

## 2012-04-14 MED ORDER — GLYCOPYRROLATE 0.2 MG/ML IJ SOLN
INTRAMUSCULAR | Status: DC | PRN
  Administered 2012-04-14: .8 mg via INTRAVENOUS

## 2012-04-14 MED ORDER — OLMESARTAN-AMLODIPINE-HCTZ 40-10-25 MG PO TABS: 1.0000 | ORAL_TABLET | Freq: Every day | ORAL | Status: DC

## 2012-04-14 MED ORDER — ROCURONIUM BROMIDE 100 MG/10ML IV SOLN
INTRAVENOUS | Status: DC | PRN
  Administered 2012-04-14: 50 mg via INTRAVENOUS

## 2012-04-14 MED ORDER — OXYCODONE HCL 5 MG PO TABS: 5.0000 mg | ORAL_TABLET | Freq: Once | ORAL | Status: DC | PRN

## 2012-04-14 MED ORDER — MIDAZOLAM HCL 2 MG/2ML IJ SOLN
INTRAMUSCULAR | Status: AC
  Filled 2012-04-14: qty 2

## 2012-04-14 MED ORDER — PROPOFOL 10 MG/ML IV BOLUS
INTRAVENOUS | Status: DC | PRN
  Administered 2012-04-14: 175 mg via INTRAVENOUS

## 2012-04-14 MED ORDER — MIDAZOLAM HCL 2 MG/2ML IJ SOLN
1.0000 mg | INTRAMUSCULAR | Status: DC | PRN
  Administered 2012-04-14: 2 mg via INTRAVENOUS

## 2012-04-14 MED ORDER — BUPIVACAINE LIPOSOME 1.3 % IJ SUSP
20.0000 mL | Freq: Once | INTRAMUSCULAR | Status: AC
  Administered 2012-04-14: 20 mL
  Filled 2012-04-14: qty 20

## 2012-04-14 MED ORDER — LACTATED RINGERS IV SOLN
INTRAVENOUS | Status: DC | PRN
  Administered 2012-04-14 (×2): via INTRAVENOUS

## 2012-04-14 MED ORDER — LACTATED RINGERS IV SOLN
INTRAVENOUS | Status: DC
  Administered 2012-04-14: 10:00:00 via INTRAVENOUS

## 2012-04-14 MED ORDER — BISACODYL 10 MG RE SUPP: 10.0000 mg | Freq: Every day | RECTAL | Status: DC | PRN

## 2012-04-14 MED ORDER — WARFARIN - PHARMACIST DOSING INPATIENT: Freq: Every day | Status: DC

## 2012-04-14 SURGICAL SUPPLY — 71 items
BANDAGE ESMARK 6X9 LF (GAUZE/BANDAGES/DRESSINGS) ×1 IMPLANT
BEARIN INSERT TIBIAL SZ4 9 (Orthopedic Implant) ×2 IMPLANT
BEARING INSERT TIBIAL SZ4 9 (Orthopedic Implant) IMPLANT
BLADE SAG 18X100X1.27 (BLADE) ×4 IMPLANT
BNDG CMPR 9X6 STRL LF SNTH (GAUZE/BANDAGES/DRESSINGS) ×1
BNDG ESMARK 6X9 LF (GAUZE/BANDAGES/DRESSINGS) ×2
BOOTCOVER CLEANROOM LRG (PROTECTIVE WEAR) ×4 IMPLANT
BOWL SMART MIX CTS (DISPOSABLE) ×2 IMPLANT
CLOTH BEACON ORANGE TIMEOUT ST (SAFETY) ×2 IMPLANT
COVER BACK TABLE 24X17X13 BIG (DRAPES) ×1 IMPLANT
COVER SURGICAL LIGHT HANDLE (MISCELLANEOUS) ×2 IMPLANT
CUFF TOURNIQUET SINGLE 34IN LL (TOURNIQUET CUFF) ×2 IMPLANT
DRAPE EXTREMITY T 121X128X90 (DRAPE) ×2 IMPLANT
DRAPE PROXIMA HALF (DRAPES) ×2 IMPLANT
DRAPE U-SHAPE 47X51 STRL (DRAPES) ×2 IMPLANT
DRSG PAD ABDOMINAL 8X10 ST (GAUZE/BANDAGES/DRESSINGS) ×3 IMPLANT
DURAPREP 26ML APPLICATOR (WOUND CARE) ×2 IMPLANT
ELECT CAUTERY BLADE 6.4 (BLADE) ×2 IMPLANT
ELECT REM PT RETURN 9FT ADLT (ELECTROSURGICAL) ×2
ELECTRODE REM PT RTRN 9FT ADLT (ELECTROSURGICAL) ×1 IMPLANT
EVACUATOR 1/8 PVC DRAIN (DRAIN) ×2 IMPLANT
FACESHIELD LNG OPTICON STERILE (SAFETY) ×3 IMPLANT
GAUZE XEROFORM 5X9 LF (GAUZE/BANDAGES/DRESSINGS) ×2 IMPLANT
GLOVE BIO SURGEON STRL SZ8.5 (GLOVE) ×2 IMPLANT
GLOVE BIOGEL PI IND STRL 7.0 (GLOVE) IMPLANT
GLOVE BIOGEL PI IND STRL 7.5 (GLOVE) ×1 IMPLANT
GLOVE BIOGEL PI IND STRL 8 (GLOVE) ×1 IMPLANT
GLOVE BIOGEL PI INDICATOR 7.0 (GLOVE) ×1
GLOVE BIOGEL PI INDICATOR 7.5 (GLOVE) ×1
GLOVE BIOGEL PI INDICATOR 8 (GLOVE) ×1
GLOVE ORTHO TXT STRL SZ7.5 (GLOVE) ×4 IMPLANT
GLOVE SS BIOGEL STRL SZ 7 (GLOVE) IMPLANT
GLOVE SUPERSENSE BIOGEL SZ 7 (GLOVE) ×1
GLOVE SURG SS PI 6.5 STRL IVOR (GLOVE) ×1 IMPLANT
GLOVE SURG SS PI 8.5 STRL IVOR (GLOVE) ×2
GLOVE SURG SS PI 8.5 STRL STRW (GLOVE) IMPLANT
GOWN BRE IMP SLV SIRUS LXLNG (GOWN DISPOSABLE) ×1 IMPLANT
GOWN PREVENTION PLUS XLARGE (GOWN DISPOSABLE) ×4 IMPLANT
GOWN STRL NON-REIN LRG LVL3 (GOWN DISPOSABLE) ×3 IMPLANT
GOWN STRL REIN 2XL XLG LVL4 (GOWN DISPOSABLE) ×4 IMPLANT
HANDPIECE INTERPULSE COAX TIP (DISPOSABLE) ×2
IMMOBILIZER KNEE 22 UNIV (SOFTGOODS) ×2 IMPLANT
IMMOBILIZER KNEE 24 THIGH 36 (MISCELLANEOUS) IMPLANT
IMMOBILIZER KNEE 24 UNIV (MISCELLANEOUS)
KIT BASIN OR (CUSTOM PROCEDURE TRAY) ×2 IMPLANT
KIT ROOM TURNOVER OR (KITS) ×2 IMPLANT
MANIFOLD NEPTUNE II (INSTRUMENTS) ×2 IMPLANT
NEEDLE 18GX1X1/2 (RX/OR ONLY) (NEEDLE) ×2 IMPLANT
NEEDLE 22X1 1/2 (OR ONLY) (NEEDLE) ×1 IMPLANT
NS IRRIG 1000ML POUR BTL (IV SOLUTION) ×2 IMPLANT
PACK TOTAL JOINT (CUSTOM PROCEDURE TRAY) ×2 IMPLANT
PAD ARMBOARD 7.5X6 YLW CONV (MISCELLANEOUS) ×4 IMPLANT
PAD CAST 4YDX4 CTTN HI CHSV (CAST SUPPLIES) ×1 IMPLANT
PADDING CAST COTTON 4X4 STRL (CAST SUPPLIES) ×2
PADDING CAST COTTON 6X4 STRL (CAST SUPPLIES) ×2 IMPLANT
RUBBERBAND STERILE (MISCELLANEOUS) ×2 IMPLANT
SET HNDPC FAN SPRY TIP SCT (DISPOSABLE) ×1 IMPLANT
SPONGE GAUZE 4X4 12PLY (GAUZE/BANDAGES/DRESSINGS) ×2 IMPLANT
STAPLER VISISTAT 35W (STAPLE) ×2 IMPLANT
SUCTION FRAZIER TIP 10 FR DISP (SUCTIONS) ×2 IMPLANT
SUT VIC AB 1 CTX 36 (SUTURE) ×4
SUT VIC AB 1 CTX36XBRD ANBCTR (SUTURE) ×2 IMPLANT
SUT VIC AB 2-0 CT1 27 (SUTURE) ×4
SUT VIC AB 2-0 CT1 TAPERPNT 27 (SUTURE) ×2 IMPLANT
SYR 30ML LL (SYRINGE) ×1 IMPLANT
SYR 30ML SLIP (SYRINGE) ×3 IMPLANT
SYR 50ML SLIP (SYRINGE) ×1 IMPLANT
TOWEL OR 17X24 6PK STRL BLUE (TOWEL DISPOSABLE) ×2 IMPLANT
TOWEL OR 17X26 10 PK STRL BLUE (TOWEL DISPOSABLE) ×2 IMPLANT
TRAY FOLEY CATH 14FR (SET/KITS/TRAYS/PACK) ×2 IMPLANT
WATER STERILE IRR 1000ML POUR (IV SOLUTION) ×2 IMPLANT

## 2012-04-14 NOTE — Progress Notes (Signed)
Orthopedic Tech Progress Note Patient Details:  [name] [birth year] [id number]  CPM Right Knee CPM Right Knee: On Right Knee Flexion (Degrees): 60  Right Knee Extension (Degrees): 0  Additional Comments: trapeze bar   Cammer, Mickie Bail 04/14/2012, 3:35 PM

## 2012-04-14 NOTE — Transfer of Care (Signed)
Immediate Anesthesia Transfer of Care Note  Patient: [name]  Procedure(s) Performed: Procedure(s) (LRB) with comments: TOTAL KNEE ARTHROPLASTY (Right) - RIGHT ARTHROPLASTY KNEE MEDIAL/LATERAL COMPARTMENTS WITH PATELLA RESURFACING  Patient Location: PACU  Anesthesia Type:General  Level of Consciousness: awake, alert  and oriented  Airway & Oxygen Therapy: Patient Spontanous Breathing and Patient connected to nasal cannula oxygen  Post-op Assessment: Report given to PACU RN, Post -op Vital signs reviewed and stable and Patient moving all extremities X 4  Post vital signs: Reviewed and stable  Complications: No apparent anesthesia complications

## 2012-04-14 NOTE — Anesthesia Postprocedure Evaluation (Signed)
  Anesthesia Post-op Note  Patient: [name]  Procedure(s) Performed: Procedure(s) (LRB) with comments: TOTAL KNEE ARTHROPLASTY (Right) - RIGHT ARTHROPLASTY KNEE MEDIAL/LATERAL COMPARTMENTS WITH PATELLA RESURFACING  Patient Location: PACU  Anesthesia Type:General  Level of Consciousness: awake  Airway and Oxygen Therapy: Patient Spontanous Breathing  Post-op Pain: mild  Post-op Assessment: Post-op Vital signs reviewed, Patient's Cardiovascular Status Stable, Respiratory Function Stable, Patent Airway, No signs of Nausea or vomiting and Pain level controlled  Post-op Vital Signs: stable  Complications: No apparent anesthesia complications

## 2012-04-14 NOTE — Anesthesia Procedure Notes (Addendum)
Anesthesia Regional Block:  Femoral nerve block  Pre-Anesthetic Checklist: ,, timeout performed, Correct Patient, Correct Site, Correct Laterality, Correct Procedure, Correct Position, site marked, Risks and benefits discussed,  Surgical consent,  Pre-op evaluation,  At surgeon's request and post-op pain management  Laterality: Right  Prep: chloraprep       Needles:  Injection technique: Single-shot  Needle Type: Echogenic Stimulator Needle      Needle Gauge: 22 and 22 G    Additional Needles:  Procedures: nerve stimulator Femoral nerve block  Nerve Stimulator or Paresthesia:  Response: 0.5 mA,   Additional Responses:   Narrative:  Start time: 04/14/2012 10:07 AM End time: 04/14/2012 10:14 AM Injection made incrementally with aspirations every 5 mL. Anesthesiologist: Dr Gypsy Balsam  Additional Notes: [id number] R FNB POP CHG, sterile tech #22 stim needle w/stim down to .5ma Marc .5% w/epi 25cc Multiple neg asp No compl Dr Gypsy Balsam   Procedure Name: Intubation Date/Time: 04/14/2012 11:11 AM Performed by: Carmela Rima Pre-anesthesia Checklist: Patient identified, Timeout performed, Emergency Drugs available, Suction available and Patient being monitored Patient Re-evaluated:Patient Re-evaluated prior to inductionOxygen Delivery Method: Circle system utilized Preoxygenation: Pre-oxygenation with 100% oxygen Intubation Type: IV induction Ventilation: Mask ventilation without difficulty Laryngoscope Size: Mac and 3 Grade View: Grade II Tube type: Oral Tube size: 7.5 mm Number of attempts: 1 Placement Confirmation: ETT inserted through vocal cords under direct vision,  positive ETCO2 and breath sounds checked- equal and bilateral Secured at: 23 cm Tube secured with: Tape Dental Injury: Teeth and Oropharynx as per pre-operative assessment

## 2012-04-14 NOTE — Interval H&P Note (Signed)
History and Physical Interval Note:  04/14/2012 8:26 AM  [name]  has presented today for surgery, with the diagnosis of degenerative arthritis right knee/leg  The various methods of treatment have been discussed with the patient and family. After consideration of risks, benefits and other options for treatment, the patient has consented to  Procedure(s) (LRB) with comments: TOTAL KNEE ARTHROPLASTY (Right) - RIGHT ARTHROPLASTY KNEE MEDIAL/LATERAL COMPARTMENTS WITH PATELLA RESURFACING as a surgical intervention .  The patient's history has been reviewed, patient examined, no change in status, stable for surgery.  I have reviewed the patient's chart and labs.  Questions were answered to the patient's satisfaction.     Zyir Gassert F

## 2012-04-14 NOTE — Transfer of Care (Signed)
Immediate Anesthesia Transfer of Care Note  Patient: Eilleen J Kleinman  Procedure(s) Performed: Procedure(s) (LRB) with comments: TOTAL KNEE ARTHROPLASTY (Right) - RIGHT ARTHROPLASTY KNEE MEDIAL/LATERAL COMPARTMENTS WITH PATELLA RESURFACING  Patient Location: PACU  Anesthesia Type:General  Level of Consciousness: awake, alert  and oriented  Airway & Oxygen Therapy: Patient Spontanous Breathing and Patient connected to nasal cannula oxygen  Post-op Assessment: Report given to PACU RN, Post -op Vital signs reviewed and stable and Patient moving all extremities X 4  Post vital signs: Reviewed and stable  Complications: No apparent anesthesia complications 

## 2012-04-14 NOTE — Brief Op Note (Signed)
04/14/2012  12:48 PM  PATIENT:  [name]  61 y.o. female  PRE-OPERATIVE DIAGNOSIS:  degenerative arthritis right knee/leg  POST-OPERATIVE DIAGNOSIS:  degenerative arthritis right knee/leg  PROCEDURE:  Procedure(s) (LRB) with comments: TOTAL KNEE ARTHROPLASTY (Right) - RIGHT ARTHROPLASTY KNEE MEDIAL/LATERAL COMPARTMENTS WITH PATELLA RESURFACING  SURGEON:  Surgeon(s) and Role:    * Loreta Ave, MD - Primary  PHYSICIAN ASSISTANT: Zonia Kief M   ANESTHESIA:   regional and general  EBL:  Total I/O In: 1400 [I.V.:1400] Out: 400 [Urine:400] DRAINS: hemovac  LOCAL MEDICATIONS USED:  Exparel/injectable NS total 60cc, capsule and subq  SPECIMEN:  No Specimen  DISPOSITION OF SPECIMEN:  N/A  COUNTS:  YES  TOURNIQUET:   Total Tourniquet Time Documented: Thigh (Right) - 72 minutes PATIENT DISPOSITION:  PACU - hemodynamically stable.

## 2012-04-14 NOTE — Anesthesia Preprocedure Evaluation (Addendum)
Anesthesia Evaluation  Patient identified by MRN, date of birth, ID band Patient awake    Reviewed: Allergy & Precautions, H&P , NPO status , Patient's Chart, lab work & pertinent test results, reviewed documented beta blocker date and time   Airway Mallampati: II TM Distance: >3 FB Neck ROM: full    Dental No notable dental hx. (+) Dental Advidsory Given and Teeth Intact   Pulmonary neg pulmonary ROS, sleep apnea ,  breath sounds clear to auscultation  Pulmonary exam normal       Cardiovascular Exercise Tolerance: Good hypertension, Pt. on medications negative cardio ROS  Rhythm:regular Rate:Normal     Neuro/Psych negative neurological ROS  negative psych ROS   GI/Hepatic negative GI ROS, Neg liver ROS,   Endo/Other  negative endocrine ROSHypothyroidism   Renal/GU negative Renal ROS  negative genitourinary   Musculoskeletal   Abdominal (+) + obese,   Peds  Hematology negative hematology ROS (+)   Anesthesia Other Findings k 2.8  Reproductive/Obstetrics negative OB ROS                          Anesthesia Physical Anesthesia Plan  ASA: III  Anesthesia Plan: General   Post-op Pain Management:    Induction: Intravenous  Airway Management Planned: Oral ETT  Additional Equipment:   Intra-op Plan:   Post-operative Plan: Extubation in OR  Informed Consent: I have reviewed the patients History and Physical, chart, labs and discussed the procedure including the risks, benefits and alternatives for the proposed anesthesia with the patient or authorized representative who has indicated his/her understanding and acceptance.   Dental Advisory Given  Plan Discussed with: CRNA, Surgeon and Anesthesiologist  Anesthesia Plan Comments:        Anesthesia Quick Evaluation

## 2012-04-14 NOTE — Progress Notes (Signed)
ANTICOAGULATION CONSULT NOTE - Initial Consult  Pharmacy Consult for Warfarin Indication: VTE prophylaxis  Allergies  Allergen Reactions  . Penicillins     childhood    Patient Measurements:   Heparin Dosing Weight: n/a  Vital Signs: Temp: 97.5 F (36.4 C) (12/18 1251) Temp src: Oral (12/18 0815) BP: 131/70 mmHg (12/18 1400) Pulse Rate: 79  (12/18 1415)  Labs:  Basename 04/14/12 0801  HGB --  HCT --  PLT --  APTT --  LABPROT --  INR --  HEPARINUNFRC --  CREATININE 0.96  CKTOTAL --  CKMB --  TROPONINI --    The CrCl is unknown because both a height and weight (above a minimum accepted value) are required for this calculation.   Medical History: Past Medical History  Diagnosis Date  . Hypertension     takes Tribenzor daily  . Sleep apnea     has cpap but doesn't use;sleep study done 3-75yrs ago  . Joint pain   . Joint swelling   . Bruises easily   . Arthritis   . Cancer 2004    rt breast;takes Arimidex daily  . Hypothyroidism     takes Synthroid daily    Medications:  Prescriptions prior to admission  Medication Sig Dispense Refill  . anastrozole (ARIMIDEX) 1 MG tablet Take 1 mg by mouth daily.      Marland Kitchen levothyroxine (SYNTHROID, LEVOTHROID) 50 MCG tablet Take 50 mcg by mouth daily.      . Olmesartan-Amlodipine-HCTZ (TRIBENZOR) 40-10-25 MG TABS Take 1 tablet by mouth daily.        Assessment: 61 yo female admitted for R TKR, to begin anticoagulation with Coumadin post-op for DVT prophylaxis.  Baseline INR 0.99.  No anticoagulants PTA.  Goal of Therapy:  INR 2-3 Monitor platelets by anticoagulation protocol: Yes   Plan:  1. Coumadin 7.5 mg po x 1 tonight. 2. Daily PT/INR. 3. Will send Coumadin education materials tonight.  Simranjit Thayer, Gwenlyn Found 04/14/2012,4:42 PM

## 2012-04-15 ENCOUNTER — Encounter (HOSPITAL_COMMUNITY): Payer: Self-pay | Admitting: Orthopedic Surgery

## 2012-04-15 LAB — PROTIME-INR
INR: 1.14 (ref 0.00–1.49)
Prothrombin Time: 14.4 seconds (ref 11.6–15.2)

## 2012-04-15 LAB — URINE CULTURE: Culture: NO GROWTH

## 2012-04-15 LAB — BASIC METABOLIC PANEL
CO2: 30 mEq/L (ref 19–32)
Calcium: 8.7 mg/dL (ref 8.4–10.5)
GFR calc non Af Amer: 64 mL/min — ABNORMAL LOW (ref >90)
Potassium: 3.2 mEq/L — ABNORMAL LOW (ref 3.5–5.1)
Sodium: 139 mEq/L (ref 135–145)

## 2012-04-15 LAB — CBC
MCH: 28.6 pg (ref 26.0–34.0)
MCHC: 33.1 g/dL (ref 30.0–36.0)
Platelets: 295 10*3/uL (ref 150–400)
RBC: 3.64 MIL/uL — ABNORMAL LOW (ref 3.87–5.11)

## 2012-04-15 MED ORDER — POTASSIUM CHLORIDE 20 MEQ PO PACK
40.0000 meq | PACK | Freq: Once | ORAL | Status: DC
  Filled 2012-04-15: qty 2

## 2012-04-15 MED ORDER — WARFARIN SODIUM 7.5 MG PO TABS
7.5000 mg | ORAL_TABLET | Freq: Once | ORAL | Status: AC
  Administered 2012-04-15: 7.5 mg via ORAL
  Filled 2012-04-15: qty 1

## 2012-04-15 MED ORDER — POTASSIUM CHLORIDE CRYS ER 20 MEQ PO TBCR
40.0000 meq | EXTENDED_RELEASE_TABLET | Freq: Once | ORAL | Status: AC
  Administered 2012-04-15: 40 meq via ORAL
  Filled 2012-04-15: qty 2

## 2012-04-15 NOTE — Evaluation (Signed)
Occupational Therapy Evaluation Patient Details Name: [name] MRN: [id number] DOB: 1950-11-20 Today's Date: 04/15/2012 Time: 4098-1191 OT Time Calculation (min): 34 min  OT Assessment / Plan / Recommendation Clinical Impression  61 yo female s/p Rt TKA that could benefit from skilled Ot acutely. Recommend [facility]    OT Assessment  Patient needs continued OT Services    Follow Up Recommendations  SNF    Barriers to Discharge Decreased caregiver support    Equipment Recommendations  3 in 1 bedside comode    Recommendations for Other Services    Frequency  Min 2X/week    Precautions / Restrictions Precautions Precautions: Knee Precaution Booklet Issued: No Required Braces or Orthoses: Knee Immobilizer - Right Knee Immobilizer - Right: On except when in CPM Restrictions Weight Bearing Restrictions: Yes RLE Weight Bearing: Weight bearing as tolerated   Pertinent Vitals/Pain 8 out 10 pain Repositioned in chair with decrease nausea    ADL  Toilet Transfer: Moderate assistance Toilet Transfer Method: Sit to stand Toilet Transfer Equipment: Raised toilet seat with arms (or 3-in-1 over toilet) Equipment Used: Knee Immobilizer;Gait belt;Rolling walker Transfers/Ambulation Related to ADLs: Pt with very decrease gait velocity and required > than 5 minutes to walk ~8 ft to bedside commode ADL Comments: Pt agreeable to OT and asking questions regarding the difference in OT vs PT. pt takes small tiny breaths during ambulation and reports nausea. pt encouraged to static stand and take LONG deep breath. pt able to tolerate nausea with breathing techiques. Pt requires max v/c for sequence and mod (A) to  advance RW. Pt with decrease gait length inaddition to gait velocity. pt requires increased time to respond to questioning. pt repositioned in chair s/p toilet transfer. Recommend staff place bedside close to patient to allow enough ttime to for transfer prior to void. Pt  unsuccessful attempt at voiding bladder    OT Diagnosis: Generalized weakness;Acute pain  OT Problem List: Decreased strength;Decreased activity tolerance;Impaired balance (sitting and/or standing);Decreased safety awareness;Decreased knowledge of use of DME or AE;Decreased knowledge of precautions;Pain OT Treatment Interventions: Self-care/ADL training;Therapeutic exercise;Therapeutic activities;Patient/family education;Balance training   OT Goals Acute Rehab OT Goals OT Goal Formulation: With patient Time For Goal Achievement: 04/29/12 Potential to Achieve Goals: Good ADL Goals Pt Will Perform Lower Body Bathing: with min assist;Sit to stand from chair;with adaptive equipment ADL Goal: Lower Body Bathing - Progress: Goal set today Pt Will Perform Lower Body Dressing: with min assist;Sit to stand from chair;with adaptive equipment ADL Goal: Lower Body Dressing - Progress: Goal set today Pt Will Transfer to Toilet: with mod assist;Ambulation;3-in-1 ADL Goal: Toilet Transfer - Progress: Goal set today Pt Will Perform Toileting - Clothing Manipulation: with min assist;Sitting on 3-in-1 or toilet ADL Goal: Toileting - Clothing Manipulation - Progress: Goal set today Pt Will Perform Toileting - Hygiene: with min assist;Sit to stand from 3-in-1/toilet ADL Goal: Toileting - Hygiene - Progress: Goal set today Miscellaneous OT Goals Miscellaneous OT Goal #1: Pt will complete bed mobility Min (A) using leg lifter PRN as precursor to adls OT Goal: Miscellaneous Goal #1 - Progress: Goal set today  Visit Information  Last OT Received On: 04/15/12 Assistance Needed: +1    Subjective Data  Subjective: "I work for [employer] and we have a team that looks into these things for [place] and [name] is the best from what they told me"- pt to d/c to [facility] Patient Stated Goal: to go to rehab because I live by myself   Prior  Functioning     Home Living Lives With: Alone Available Help at  Discharge: Skilled Nursing Facility Type of Home: House Home Access: Stairs to enter Secretary/administrator of Steps: 4 Entrance Stairs-Rails: Right Home Layout: Two level Alternate Level Stairs-Number of Steps: 12 Alternate Level Stairs-Rails: Left Home Adaptive Equipment: None Prior Function Level of Independence: Independent Able to Take Stairs?: Yes Driving: Yes Vocation: Full time employment Communication Communication: No difficulties Dominant Hand: Right         Vision/Perception     Cognition  Overall Cognitive Status: Appears within functional limits for tasks assessed/performed Arousal/Alertness: Lethargic Orientation Level: Appears intact for tasks assessed Behavior During Session: [facility] for tasks performed    Extremity/Trunk Assessment Right Upper Extremity Assessment RUE ROM/Strength/Tone: Within functional levels RUE Sensation: WFL - Light Touch RUE Coordination: WFL - gross motor Left Upper Extremity Assessment LUE ROM/Strength/Tone: Within functional levels LUE Sensation: WFL - Light Touch LUE Coordination: WFL - gross motor Right Lower Extremity Assessment RLE ROM/Strength/Tone: Deficits;Due to pain RLE ROM/Strength/Tone Deficits: AA/ROM 0-30 degrees with 2/5 strength throughout. RLE Sensation: WFL - Light Touch RLE Coordination: WFL - gross motor Left Lower Extremity Assessment LLE ROM/Strength/Tone: Within functional levels LLE Sensation: WFL - Light Touch LLE Coordination: WFL - gross/fine motor Trunk Assessment Trunk Assessment: Normal     Mobility Bed Mobility Bed Mobility: Not assessed Transfers Sit to Stand: 2: Max assist;With upper extremity assist;From chair/3-in-1 Stand to Sit: 2: Max assist;With upper extremity assist;To chair/3-in-1 Details for Transfer Assistance: (A) for positioning, facilitation of hip flexion and anterior shift weight over BOS     Shoulder Instructions        Balance Balance Balance Assessed: No   End of  Session OT - End of Session Activity Tolerance: Patient tolerated treatment well (lethargic and slow moving) Patient left: in chair;with call bell/phone within reach Nurse Communication: Mobility status;Precautions CPM Right Knee CPM Right Knee: Off  GO     Harrel Carina [facility] 04/15/2012, 11:41 AM Pager: [phone]

## 2012-04-15 NOTE — Clinical Social Work Placement (Addendum)
Clinical Social Work Department  CLINICAL SOCIAL WORK PLACEMENT NOTE  04/15/2012  Patient: [name] Account Number:  [id number]  Admit date: 04/14/2012  Clinical Social Worker: Sabino Niemann CSW Date/time: 04/15/2012 11:30 AM  Clinical Social Work is seeking post-discharge placement for this patient at the following level of care: SKILLED NURSING (*CSW will update this form in Epic as items are completed)  04/15/2012 Patient/family provided with [facility] Department of Clinical Social Work's list of facilities offering this level of care within the geographic area requested by the patient (or if unable, by the patient's family).  04/15/2012 Patient/family informed of their freedom to choose among providers that offer the needed level of care, that participate in Medicare, Medicaid or managed care program needed by the patient, have an available bed and are willing to accept the patient.  04/15/2012 Patient/family informed of [facility]' ownership interest in [facility], as well as of the fact that they are under no obligation to receive care at this facility.  PASARR submitted to EDS on 04/16/12  PASARR number received from EDS on 04/16/12  FL2 transmitted to all facilities in geographic area requested by pt/family on 04/15/2012  FL2 transmitted to all facilities within larger geographic area on  Patient informed that his/her managed care company has contracts with or will negotiate with certain facilities, including the following:  Patient/family informed of bed offers received: [facility]  Patient chooses bed at [facility]  Physician recommends and patient chooses bed at  Patient to be transferred to on 04/16/12 Patient to be transferred to facility by  [facility] The following physician request were entered in Epic:  Additional Comments:  Sabino Niemann, MSW  [phone]

## 2012-04-15 NOTE — Progress Notes (Addendum)
ANTICOAGULATION CONSULT NOTE - Follow Up Consult  Pharmacy Consult for Coumadin Indication: VTE prophylaxis s/p TKR  Vital Signs: Temp: 98.7 F (37.1 C) (12/20 0630) Temp src: Oral (12/20 0630) BP: 150/76 mmHg (12/20 0630) Pulse Rate: 113  (12/20 0630)  Labs:  Basename 04/16/12 0600 04/15/12 0530 04/14/12 0801  HGB 10.8* 10.4* --  HCT 32.3* 31.4* --  PLT 281 295 --  APTT -- -- --  LABPROT 18.1* 14.4 --  INR 1.55* 1.14 --  HEPARINUNFRC -- -- --  CREATININE 0.87 0.94 0.96  CKTOTAL -- -- --  CKMB -- -- --  TROPONINI -- -- --   Assessment:  61 yo female admitted 04/14/12 for right TKR on Coumadin post-op for DVT prophylaxis. Patient is also on Lovenox SQ until INR >1.8. INR up to 1.55 today after 2 doses (0.99 at baseline). Hgb down post-op but stable. No bleeding reported by RN or in chart notes.  Goal of Therapy:  INR 2-3   Plan:  1) Coumadin 5 mg PO x 1 tonight. 2) Follow-up daily INR. 3) Discontinue Lovenox when INR >1.8  Benjaman Pott, PharmD    04/16/2012   11:15 AM

## 2012-04-15 NOTE — Progress Notes (Signed)
Subjective: Doing well.  Pain controlled.     Objective: Vital signs in last 24 hours: Temp:  [98.1 F (36.7 C)-99.7 F (37.6 C)] 98.1 F (36.7 C) (12/19 1313) Pulse Rate:  [66-87] 66  (12/19 1313) Resp:  [12-20] 18  (12/19 1313) BP: (120-137)/(54-73) 129/60 mmHg (12/19 1313) SpO2:  [95 %-100 %] 98 % (12/19 1313)  Intake/Output from previous day: 12/18 0701 - 12/19 0700 In: 1400 [I.V.:1400] Out: 910 [Urine:875; Drains:35] Intake/Output this shift:     Basename 04/15/12 0530  HGB 10.4*    Basename 04/15/12 0530  WBC 10.2  RBC 3.64*  HCT 31.4*  PLT 295    Basename 04/15/12 0530 04/14/12 0801  NA 139 140  K 3.2* 2.8*  CL 101 99  CO2 30 29  BUN 9 11  CREATININE 0.94 0.96  GLUCOSE 129* 119*  CALCIUM 8.7 9.8    Basename 04/15/12 0530  LABPT --  INR 1.14   Exam:  Dressing c/d/i.  Calf nt, nvi.    Assessment/Plan: Will need short snf placement for rehab.  Has visited [facility].  Transfer Friday if bed avail. Hypokalemia.  Give kcl po x one dose now.  Recheck bmet.     [name] M 04/15/2012, 2:00 PM

## 2012-04-15 NOTE — Op Note (Signed)
NAME:  [name], DOWE NO.:  [id number]  MEDICAL RECORD NO.:  [id number]  LOCATION:  [id number]                        FACILITY:  [facility]  PHYSICIAN:  Loreta Ave, M.D. DATE OF BIRTH:  12/11/1950  DATE OF PROCEDURE:  04/14/2012 DATE OF DISCHARGE:                              OPERATIVE REPORT   PREOPERATIVE DIAGNOSIS:  Right knee end-stage degenerative arthritis, varus alignment.  POSTOPERATIVE DIAGNOSIS:  Right knee end-stage degenerative arthritis, varus alignment.  PROCEDURE:  Right knee modified minimally invasive total knee replacement with Stryker triathlon prosthesis.  Soft tissue balancing. Cemented pegged posterior stabilized #3 femoral component.  Cemented #4 tibial component, 9 mm polyethylene insert.  Cemented pegged medial offset resurfacing 32 mm patellar component.  SURGEON:  Loreta Ave, M.D.  ASSISTANT:  Genene Churn. Denton Meek., present throughout the entire case and necessary for timely completion of procedure.  ANESTHESIA:  General.  BLOOD LOSS:  Minimal.  SPECIMENS:  None.  CULTURES:  None.  COMPLICATIONS:  None.  DRESSINGS:  Soft compressive knee immobilizer.  DRAINS:  Hemovac x1.  TOURNIQUET TIME:  45 minutes.  PROCEDURE:  The patient was brought to the operating room, placed on the operating table in supine position.  After adequate anesthesia had been obtained, tourniquet applied.  Prepped and draped in usual sterile fashion.  Exsanguinated with elevation, Esmarch.  Tourniquet inflated to 350 mmHg.  Straight incision above the patella down the tibial tubercle. Medial arthrotomy, vastus splitting, preserving quad tendon.  Hemostasis with cautery.  Medial capsule release.  Remnants of menisci, cruciate ligaments, periarticular spurs removed.  Intramedullary guide distal femur.  8 mm resection, 5 degrees of valgus.  Using epicondylar axis, the femur was sized, cut, and fitted for a pegged #3 posterior stabilized component.   Proximal tibia exposed.  Extramedullary guide.  A 3-degree posterior slope cut.  Size #4 component.  Patella exposed, posterior 10 mm removed.  Drilled, sized, and fitted for a 32 mm component.  Copious irrigation.  Debris cleared throughout including posterior recess.  Trial was put in place.  #3 on the femur, #4 on the tibia, 9 mm insert and 32 patella.  With this construct, excellent biomechanical axis.  Nicely balanced in flexion/extension.  Full motion. No patellar tracking.  Tibia was marked for rotation and reamed.  All trials removed.  Copious irrigation with pulse irrigating device. Infiltration of soft tissue with Exparel.  Cement prepared, placed on all components, firmly seated.  Polyethylene attached to tibia, knee reduced.  Patella held with a clamp.  Once cement hardened, the knee was irrigated once again.  Arthrotomy closed with #1 Vicryl.  Skin and subcutaneous tissue with Vicryl and staples.  Hemovac had been placed and brought out through a separate stab wound.  Sterile compressive dressing applied.  Tourniquet deflated and removed.  Knee immobilizer applied.  Anesthesia reversed.  Brought to the recovery room.  Tolerated surgery well.  No complications.     Loreta Ave, M.D.     DFM/MEDQ  D:  04/14/2012  T:  04/15/2012  Job:  [phone] [id number]

## 2012-04-15 NOTE — Clinical Social Work Psychosocial (Signed)
Clinical Social Work Department  BRIEF PSYCHOSOCIAL ASSESSMENT  Patient: [name] Account Number: [id number] Admit date: 04/14/12  Clinical Social Worker Date/Time:  Referred by: Physician Date Referred: 04/15/12  Referred for   SNF Placement   Other Referral:  Interview type: Patient  Other interview type:  PSYCHOSOCIAL DATA  Living Status: Family  Admitted from facility:  Level of care:  Primary support name: [name],[name]  Primary support relationship to patient: Son  Degree of support available:  Strong and vested   CURRENT CONCERNS  Current Concerns   Post-Acute Placement   Other Concerns:  SOCIAL WORK ASSESSMENT / PLAN  CSW met with pt re: PT recommendation for SNF.   Pt lives with family   CSW explained placement process and answered questions.   [name]. [facility] her bed.   CSW completed FL2 and initiated [facility] search.   Weekday CSW to [name]/u with offers.   Assessment/plan status: Information/Referral to [facility]  Other assessment/ plan:  Information/referral to community resources:  SNF   PTAR   PATIENT'S/FAMILY'S RESPONSE TO PLAN OF CARE:  Pt is agreeable to SNF for the patient in order to increase strength and independence with mobility prior to return home with her grandson. Pt verbalized understanding of placement process and appreciation for CSW assist.   Sabino Niemann, MSW  [phone]  Patient can d/c to [facility] when medically stable.

## 2012-04-15 NOTE — Evaluation (Addendum)
Physical Therapy Evaluation Patient Details Name: [name] MRN: [id number] DOB: 10-21-1950 Today's Date: 04/15/2012 Time: 8657-8469 PT Time Calculation (min): 43 min  PT Assessment / Plan / Recommendation Clinical Impression  Pt is a 61 y/o female admitted s/p right TKA along with the below PT problem list. Pt would benefit from acute PT to maximize independence and facilitate d/c to [facility].    PT Assessment  Patient needs continued PT services    Follow Up Recommendations  SNF    Does the patient have the potential to tolerate intense rehabilitation      Barriers to Discharge Decreased caregiver support      Equipment Recommendations  Rolling walker with 5" wheels    Recommendations for Other Services     Frequency 7X/week    Precautions / Restrictions Precautions Precautions: Knee Precaution Booklet Issued: No Required Braces or Orthoses: Knee Immobilizer - Right Knee Immobilizer - Right: On except when in CPM Restrictions Weight Bearing Restrictions: Yes RLE Weight Bearing: Weight bearing as tolerated   Pertinent Vitals/Pain 8/10 in right knee. Pt repositioned and RN aware.      Mobility  Bed Mobility Bed Mobility: Not assessed Transfers Transfers: Sit to Stand;Stand to Sit Sit to Stand: 4: Min assist;With upper extremity assist;From chair/3-in-1 Stand to Sit: 4: Min assist;With upper extremity assist;To chair/3-in-1 Details for Transfer Assistance: Assist for balance and to translate trunk anterior over BOS. Cues for safest hand/right LE placement. Ambulation/Gait Ambulation/Gait Assistance: 4: Min assist Ambulation Distance (Feet): 5 Feet Assistive device: Rolling walker Ambulation/Gait Assistance Details: Assist to advance RW and off weight right LE due to pain. Pt with severe nausea limiting distance ambulated. Cues for sequence. Gait Pattern: Step-to pattern;Decreased step length - right;Decreased stance time - right;Trunk flexed;Shuffle Stairs:  No Wheelchair Mobility Wheelchair Mobility: No    Shoulder Instructions     Exercises Total Joint Exercises Ankle Circles/Pumps: AAROM;Right;10 reps;Supine Quad Sets: AAROM;Right;10 reps;Supine Heel Slides: AAROM;Right;10 reps;Supine   PT Diagnosis: Difficulty walking;Acute pain  PT Problem List: Decreased strength;Decreased range of motion;Decreased activity tolerance;Decreased balance;Decreased mobility;Decreased knowledge of use of DME;Decreased knowledge of precautions;Pain PT Treatment Interventions: DME instruction;Gait training;Functional mobility training;Therapeutic activities;Therapeutic exercise;Balance training;Patient/family education   PT Goals Acute Rehab PT Goals PT Goal Formulation: With patient Time For Goal Achievement: 04/29/12 Potential to Achieve Goals: Good Pt will go Supine/Side to Sit: with modified independence PT Goal: Supine/Side to Sit - Progress: Goal set today Pt will go Sit to Supine/Side: with modified independence PT Goal: Sit to Supine/Side - Progress: Goal set today Pt will go Sit to Stand: with modified independence PT Goal: Sit to Stand - Progress: Goal set today Pt will go Stand to Sit: with modified independence PT Goal: Stand to Sit - Progress: Goal set today Pt will Ambulate: >150 feet;with modified independence;with least restrictive assistive device PT Goal: Ambulate - Progress: Goal set today Pt will Perform Home Exercise Program: Independently PT Goal: Perform Home Exercise Program - Progress: Goal set today  Visit Information  Last PT Received On: 04/15/12 Assistance Needed: +1    Subjective Data  Subjective: "Just not feeling the greatest." Patient Stated Goal: Decrease pain.   Prior Functioning  Home Living Lives With: Alone Type of Home: House Home Access: Stairs to enter Entrance Stairs-Number of Steps: 4 Entrance Stairs-Rails: Right Home Layout: Two level Alternate Level Stairs-Number of Steps: 12 Alternate Level  Stairs-Rails: Left Home Adaptive Equipment: None Prior Function Level of Independence: Independent Able to Take Stairs?: Yes Driving: Yes Vocation:  Full time employment Communication Communication: No difficulties Dominant Hand: Right    Cognition  Overall Cognitive Status: Appears within functional limits for tasks assessed/performed Arousal/Alertness: Lethargic Orientation Level: Appears intact for tasks assessed Behavior During Session: [facility] for tasks performed    Extremity/Trunk Assessment Right Upper Extremity Assessment RUE ROM/Strength/Tone: Within functional levels RUE Sensation: WFL - Light Touch RUE Coordination: WFL - gross motor Left Upper Extremity Assessment LUE ROM/Strength/Tone: Within functional levels LUE Sensation: WFL - Light Touch LUE Coordination: WFL - gross motor Right Lower Extremity Assessment RLE ROM/Strength/Tone: Deficits;Due to pain RLE ROM/Strength/Tone Deficits: AA/ROM 0-30 degrees with 2/5 strength throughout. RLE Sensation: WFL - Light Touch RLE Coordination: WFL - gross motor Left Lower Extremity Assessment LLE ROM/Strength/Tone: Within functional levels LLE Sensation: WFL - Light Touch LLE Coordination: WFL - gross/fine motor Trunk Assessment Trunk Assessment: Normal   Balance Balance Balance Assessed: No  End of Session PT - End of Session Equipment Utilized During Treatment: Gait belt;Right knee immobilizer Activity Tolerance: Other (comment) (Limited by nausea.) Patient left: in chair;with call bell/phone within reach Nurse Communication: Mobility status CPM Right Knee CPM Right Knee: Off  GP     Cephus Shelling 04/15/2012, 9:52 AM  04/15/2012 Cephus Shelling, PT, DPT [phone]

## 2012-04-16 LAB — BASIC METABOLIC PANEL
BUN: 7 mg/dL (ref 6–23)
Chloride: 105 mEq/L (ref 96–112)
Creatinine, Ser: 0.87 mg/dL (ref 0.50–1.10)
GFR calc Af Amer: 82 mL/min — ABNORMAL LOW (ref >90)
Glucose, Bld: 117 mg/dL — ABNORMAL HIGH (ref 70–99)

## 2012-04-16 LAB — CBC
HCT: 32.3 % — ABNORMAL LOW (ref 36.0–46.0)
MCHC: 33.4 g/dL (ref 30.0–36.0)
MCV: 88 fL (ref 78.0–100.0)
RDW: 12.3 % (ref 11.5–15.5)
WBC: 10.8 10*3/uL — ABNORMAL HIGH (ref 4.0–10.5)

## 2012-04-16 MED ORDER — POTASSIUM CHLORIDE CRYS ER 20 MEQ PO TBCR
60.0000 meq | EXTENDED_RELEASE_TABLET | Freq: Once | ORAL | Status: AC
  Administered 2012-04-16: 60 meq via ORAL
  Filled 2012-04-16: qty 3

## 2012-04-16 MED ORDER — ENOXAPARIN SODIUM 30 MG/0.3ML ~~LOC~~ SOLN: 30.0000 mg | Freq: Two times a day (BID) | SUBCUTANEOUS | Status: DC

## 2012-04-16 MED ORDER — BISACODYL 5 MG PO TBEC
10.0000 mg | DELAYED_RELEASE_TABLET | Freq: Every day | ORAL | Status: DC | PRN
  Administered 2012-04-16: 10 mg via ORAL
  Filled 2012-04-16: qty 2

## 2012-04-16 MED ORDER — OXYCODONE-ACETAMINOPHEN 5-325 MG PO TABS: 1.0000 | ORAL_TABLET | ORAL | Status: DC | PRN

## 2012-04-16 MED ORDER — METHOCARBAMOL 500 MG PO TABS: 500.0000 mg | ORAL_TABLET | Freq: Four times a day (QID) | ORAL | Status: DC | PRN

## 2012-04-16 MED ORDER — WARFARIN SODIUM 5 MG PO TABS
5.0000 mg | ORAL_TABLET | Freq: Once | ORAL | Status: DC
  Filled 2012-04-16: qty 1

## 2012-04-16 MED ORDER — POTASSIUM CHLORIDE 20 MEQ PO PACK
60.0000 meq | PACK | Freq: Once | ORAL | Status: DC
  Filled 2012-04-16: qty 3

## 2012-04-16 MED ORDER — WARFARIN SODIUM 5 MG PO TABS: 5.0000 mg | ORAL_TABLET | Freq: Every day | ORAL | Status: DC

## 2012-04-16 NOTE — Addendum Note (Signed)
Addendum  created 04/16/12 1030 by Naomee Nowland A Kohlton Gilpatrick, CRNA   Modules edited:Anesthesia Events    

## 2012-04-16 NOTE — Progress Notes (Signed)
Physical Therapy Treatment Patient Details Name: [name] MRN: [id number] DOB: 1950/08/16 Today's Date: 04/16/2012 Time: 7829-5621 PT Time Calculation (min): 23 min  PT Assessment / Plan / Recommendation Comments on Treatment Session  Patient progressng well with ambulation this session. Conitnue with current POC    Follow Up Recommendations  SNF     Does the patient have the potential to tolerate intense rehabilitation     Barriers to Discharge        Equipment Recommendations  Rolling walker with 5" wheels    Recommendations for Other Services    Frequency 7X/week   Plan Discharge plan remains appropriate;Frequency remains appropriate    Precautions / Restrictions Precautions Precautions: Knee Required Braces or Orthoses: Knee Immobilizer - Right Knee Immobilizer - Right: On except when in CPM Restrictions RLE Weight Bearing: Weight bearing as tolerated   Pertinent Vitals/Pain     Mobility  Bed Mobility Bed Mobility: Not assessed Supine to Sit: 4: Min assist;With rails Details for Bed Mobility Assistance: A for R LE Transfers Sit to Stand: 4: Min guard;With upper extremity assist;From chair/3-in-1 Stand to Sit: 4: Min guard;With upper extremity assist;To chair/3-in-1 Details for Transfer Assistance: Patient with MinGuard for safety but able to do with safe technique Ambulation/Gait Ambulation/Gait Assistance: 4: Min guard Ambulation Distance (Feet): 90 Feet Assistive device: Rolling walker Ambulation/Gait Assistance Details: Cues for posture and safety with Rw Gait Pattern: Step-to pattern;Decreased step length - right;Decreased step length - left;Decreased stride length;Trunk flexed Gait velocity: decreased    Exercises Total Joint Exercises Quad Sets: AROM;Left;10 reps Heel Slides: AAROM;Left;10 reps Hip ABduction/ADduction: AAROM;Left;10 reps Straight Leg Raises: AAROM;Left;10 reps Long Arc Quad: AAROM;10 reps;Right   PT Diagnosis:    PT  Problem List:   PT Treatment Interventions:     PT Goals Acute Rehab PT Goals PT Goal: Supine/Side to Sit - Progress: Progressing toward goal PT Goal: Sit to Stand - Progress: Progressing toward goal PT Goal: Stand to Sit - Progress: Progressing toward goal PT Goal: Ambulate - Progress: Progressing toward goal PT Goal: Perform Home Exercise Program - Progress: Progressing toward goal  Visit Information  Last PT Received On: 04/16/12 Assistance Needed: +1    Subjective Data      Cognition  Overall Cognitive Status: Appears within functional limits for tasks assessed/performed Arousal/Alertness: Awake/alert Orientation Level: Appears intact for tasks assessed Behavior During Session: [facility] for tasks performed    Balance     End of Session PT - End of Session Equipment Utilized During Treatment: Gait belt;Right knee immobilizer Activity Tolerance: Patient tolerated treatment well;Patient limited by fatigue Patient left: in chair;with call bell/phone within reach Nurse Communication: Mobility status   GP     Fredrich Birks 04/16/2012, 1:38 PM 04/16/2012 Fredrich Birks PTA [phone] pager [phone] office

## 2012-04-16 NOTE — Progress Notes (Signed)
Physical Therapy Treatment Patient Details Name: AURIELLA WIEAND MRN: [id number] DOB: [birth year] Today's Date: 04/16/2012 Time: 4098-1191 PT Time Calculation (min): 27 min  PT Assessment / Plan / Recommendation Comments on Treatment Session  Patient progressing slowly with ambulation. Increased strength overall. Continue with current POC    Follow Up Recommendations  SNF     Does the patient have the potential to tolerate intense rehabilitation     Barriers to Discharge        Equipment Recommendations  Rolling walker with 5" wheels    Recommendations for Other Services    Frequency 7X/week   Plan Discharge plan remains appropriate;Frequency remains appropriate    Precautions / Restrictions Precautions Required Braces or Orthoses: Knee Immobilizer - Right Knee Immobilizer - Right: On except when in CPM Restrictions RLE Weight Bearing: Weight bearing as tolerated   Pertinent Vitals/Pain     Mobility  Bed Mobility Bed Mobility: Supine to Sit Supine to Sit: 4: Min assist;With rails Details for Bed Mobility Assistance: A for R LE Transfers Sit to Stand: 4: Min assist;From bed;From chair/3-in-1 Stand to Sit: 4: Min assist;To chair/3-in-1 Details for Transfer Assistance: A to enusre stability and to control descent into recilner Ambulation/Gait Ambulation/Gait Assistance: 4: Min guard Ambulation Distance (Feet): 20 Feet Assistive device: Rolling walker Ambulation/Gait Assistance Details: Cues for sequence. Patient limited by lightheadedness, subsided with sitting  break Gait Pattern: Decreased step length - right;Step-to pattern;Decreased step length - left;Trunk flexed    Exercises Total Joint Exercises Quad Sets: AROM;10 reps;Right Heel Slides: AAROM;Right;10 reps Hip ABduction/ADduction: AAROM;Right;10 reps Long Arc Quad: AAROM;10 reps;Right   PT Diagnosis:    PT Problem List:   PT Treatment Interventions:     PT Goals Acute Rehab PT Goals PT Goal:  Supine/Side to Sit - Progress: Progressing toward goal PT Goal: Sit to Stand - Progress: Progressing toward goal PT Goal: Stand to Sit - Progress: Progressing toward goal PT Goal: Ambulate - Progress: Progressing toward goal PT Goal: Perform Home Exercise Program - Progress: Progressing toward goal  Visit Information  Last PT Received On: 04/16/12 Assistance Needed: +1    Subjective Data      Cognition  Overall Cognitive Status: Appears within functional limits for tasks assessed/performed Arousal/Alertness: Awake/alert Orientation Level: Appears intact for tasks assessed Behavior During Session: [facility] for tasks performed    Balance     End of Session PT - End of Session Equipment Utilized During Treatment: Gait belt;Right knee immobilizer Activity Tolerance: Patient tolerated treatment well;Patient limited by fatigue Patient left: in chair;with call bell/phone within reach Nurse Communication: Mobility status   GP     Fredrich Birks 04/16/2012, 9:47 AM

## 2012-04-16 NOTE — Progress Notes (Signed)
Clinical social worker assisted with patient discharge to skilled nursing facility, [facility].  CSW addressed all family questions and concerns. CSW copied chart and added all important documents. CSW also set up patient transportation with [employer]. Clinical Social Worker will sign off for now as social work intervention is no longer needed.  Sabino Niemann, MSW, [employer] [phone]

## 2012-04-16 NOTE — Progress Notes (Signed)
Subjective: Doing well.  Pain controlled.  Ready for transfer to snf.   Objective: Vital signs in last 24 hours: Temp:  [98.1 F (36.7 C)-99.1 F (37.3 C)] 98.7 F (37.1 C) (12/20 0630) Pulse Rate:  [66-113] 113  (12/20 0630) Resp:  [16-18] 18  (12/20 0630) BP: (129-150)/(60-76) 150/76 mmHg (12/20 0630) SpO2:  [95 %-100 %] 100 % (12/20 0630)  Intake/Output from previous day:   Intake/Output this shift: Total I/O In: 240 [P.O.:240] Out: -    Basename 04/16/12 0600 04/15/12 0530  HGB 10.8* 10.4*    Basename 04/16/12 0600 04/15/12 0530  WBC 10.8* 10.2  RBC 3.67* 3.64*  HCT 32.3* 31.4*  PLT 281 295    Basename 04/16/12 0600 04/15/12 0530  NA 143 139  K 2.9* 3.2*  CL 105 101  CO2 27 30  BUN 7 9  CREATININE 0.87 0.94  GLUCOSE 117* 129*  CALCIUM 8.5 8.7    Basename 04/16/12 0600 04/15/12 0530  LABPT -- --  INR 1.55* 1.14    Exam:  Wound looks good.  Staples intact.  No signs of infection.  Calf nt, nvi.  Drain removed.  Assessment/Plan: Transfer to [facility] today if bed available.   Hypokalemia.  Patient states that she has chronic issues with this.  Will give kcl po 1 dose now.     [name] M 04/16/2012, 10:28 AM

## 2012-04-16 NOTE — Addendum Note (Signed)
Addendum  created 04/16/12 1030 by Adair Laundry, CRNA   Modules edited:Anesthesia Events

## 2012-04-16 NOTE — Progress Notes (Signed)
CARE MANAGEMENT NOTE 04/16/2012  Patient:  [name], TRABERT   Account Number:  [id number]  Date Initiated:  04/16/2012  Documentation initiated by:  Vance Peper  Subjective/Objective Assessment:   61 yr old female s/p right total knee arthroplasty.     Action/Plan:   Patient will be going to [facility] for shortterm rehab. Social Worker is aware.   Anticipated DC Date:  04/16/2012   Anticipated DC Plan:  SKILLED NURSING FACILITY  In-house referral  Clinical Social Worker      DC Planning Services  CM consult      Choice offered to / List presented to:             Status of service:  Completed, signed off Medicare Important Message given?   (If response is "NO", the following Medicare IM given date fields will be blank) Date Medicare IM given:   Date Additional Medicare IM given:    Discharge Disposition:  SKILLED NURSING FACILITY  Per UR Regulation:    If discussed at Long Length of Stay Meetings, dates discussed:    Comments:

## 2012-04-16 NOTE — Discharge Summary (Signed)
Physician Discharge Summary  Patient ID: [name] MRN: [id number] DOB/AGE: 61-Dec-1952 61 y.o.  Admit date: 04/14/2012 Discharge date: 04/16/2012  Admission Diagnoses: Right knee djd Discharge Diagnoses:  Right total knee replacement hypokalemia  Discharged Condition: good  Hospital Course: 61 yo bf with hx of end stage djd right knee and pain was taken to the OR 14 Apr 2012 for total knee replacement.  Tolerated anesthesia and surgery well without complication.  Transferred to the ortho unit and protocol lovenox and coumadin started for dvt prophylaxis.  19 dec, doing well with good pain control.  Gave kcl for hypokalemia.  20 dec, pain controlled.  Ready for tranfer to snf.  Knee wound looks good.  Staples intact.  Calf nt, nvi.  No signs of infection.  Gave kcl po x 1 dose.    Consults: None   Discharge Exam: Blood pressure 150/76, pulse 113, temperature 98.7 F (37.1 C), temperature source Oral, resp. rate 18, SpO2 100.00%.   Disposition: 06-Home-Health Care Svc  Discharge Orders    Future Appointments: Provider: Department: Dept Phone: Center:   06/14/2012 4:00 PM Radene Gunning [facility] MEDICAL ONCOLOGY [phone] None   06/21/2012 4:00 PM Lowella Dell, MD [facility] MEDICAL ONCOLOGY [phone] None     Future Orders Please Complete By Expires   Diet - low sodium heart healthy      Diet - low sodium heart healthy      Call MD / Call 911      Comments:   If you experience chest pain or shortness of breath, CALL 911 and be transported to the hospital emergency room.  If you develope a fever above 101 F, pus (white drainage) or increased drainage or redness at the wound, or calf pain, call your surgeon's office.   Constipation Prevention      Comments:   Drink plenty of fluids.  Prune juice may be helpful.  You may use a stool softener, such as Colace (over the counter) 100 mg twice a day.  Use MiraLax (over the  counter) for constipation as needed.   Increase activity slowly as tolerated      Discharge instructions      Comments:   Ok to shower, but no tub soaking.  Do not apply any creams or ointments to incision.  Continue physical therapy protocol.   Call MD / Call 911      Comments:   If you experience chest pain or shortness of breath, CALL 911 and be transported to the hospital emergency room.  If you develope a fever above 101 F, pus (white drainage) or increased drainage or redness at the wound, or calf pain, call your surgeon's office.   Constipation Prevention      Comments:   Drink plenty of fluids.  Prune juice may be helpful.  You may use a stool softener, such as Colace (over the counter) 100 mg twice a day.  Use MiraLax (over the counter) for constipation as needed.   Increase activity slowly as tolerated      Discharge instructions      Comments:   Ok to shower, but no tub soaking.  Do not apply any creams or ointments to incision.  Continue physical therapy protocol.   CPM      Comments:   Continuous passive motion machine (CPM):      Use the CPM from 0 to 70 degrees for 6-8 hours per day.  You may increase by 10 degrees per day as tolerated.  You may break it up into 2 or 3 sessions per day.      Use CPM for 3-4 weeks or until you are told to stop.   TED hose      Comments:   Use stockings (TED hose) for 3-4 weeks on both leg(s).  You may remove them at night for sleeping.   Change dressing      Comments:   Change dressing on right  knee daily with sterile 4 x 4 inch gauze dressing and apply TED hose.   Do not put a pillow under the knee. Place it under the heel.          Medication List     As of 04/16/2012 12:13 PM    TAKE these medications         anastrozole 1 MG tablet   Commonly known as: ARIMIDEX   Take 1 mg by mouth daily.      enoxaparin 30 MG/0.3ML injection   Commonly known as: LOVENOX   Inject 0.3 mLs (30 mg total) into the skin every 12 (twelve)  hours.      levothyroxine 50 MCG tablet   Commonly known as: SYNTHROID, LEVOTHROID   Take 50 mcg by mouth daily.      methocarbamol 500 MG tablet   Commonly known as: ROBAXIN   Take 1 tablet (500 mg total) by mouth every 6 (six) hours as needed (spasms).      oxyCODONE-acetaminophen 5-325 MG per tablet   Commonly known as: PERCOCET/ROXICET   Take 1-2 tablets by mouth every 4 (four) hours as needed for pain.      TRIBENZOR 40-10-25 MG Tabs   Generic drug: Olmesartan-Amlodipine-HCTZ   Take 1 tablet by mouth daily.      warfarin 5 MG tablet   Commonly known as: COUMADIN   Take 1 tablet (5 mg total) by mouth daily.         SignedNaida Sleight 04/16/2012, 12:13 PM

## 2012-06-14 ENCOUNTER — Other Ambulatory Visit (HOSPITAL_BASED_OUTPATIENT_CLINIC_OR_DEPARTMENT_OTHER): Payer: BC Managed Care – PPO | Admitting: Lab

## 2012-06-14 ENCOUNTER — Other Ambulatory Visit: Payer: Self-pay | Admitting: *Deleted

## 2012-06-14 DIAGNOSIS — C50919 Malignant neoplasm of unspecified site of unspecified female breast: Secondary | ICD-10-CM

## 2012-06-14 LAB — CBC WITH DIFFERENTIAL/PLATELET
BASO%: 0.6 % (ref 0.0–2.0)
EOS%: 2 % (ref 0.0–7.0)
HCT: 35.7 % (ref 34.8–46.6)
LYMPH%: 36.6 % (ref 14.0–49.7)
MCH: 27.9 pg (ref 25.1–34.0)
MCHC: 32.9 g/dL (ref 31.5–36.0)
MCV: 84.8 fL (ref 79.5–101.0)
MONO%: 6.9 % (ref 0.0–14.0)
NEUT%: 53.9 % (ref 38.4–76.8)
lymph#: 1.9 10*3/uL (ref 0.9–3.3)

## 2012-06-14 LAB — COMPREHENSIVE METABOLIC PANEL (CC13)
ALT: 14 U/L (ref 0–55)
AST: 15 U/L (ref 5–34)
Alkaline Phosphatase: 108 U/L (ref 40–150)
BUN: 11.6 mg/dL (ref 7.0–26.0)
Chloride: 104 mEq/L (ref 98–107)
Creatinine: 1 mg/dL (ref 0.6–1.1)
Total Bilirubin: 0.32 mg/dL (ref 0.20–1.20)

## 2012-06-21 ENCOUNTER — Telehealth: Payer: Self-pay | Admitting: Oncology

## 2012-06-21 ENCOUNTER — Ambulatory Visit (HOSPITAL_BASED_OUTPATIENT_CLINIC_OR_DEPARTMENT_OTHER): Payer: BC Managed Care – PPO | Admitting: Oncology

## 2012-06-21 VITALS — BP 122/79 | HR 93 | Temp 98.4°F | Resp 20 | Ht 64.0 in | Wt 209.1 lb

## 2012-06-21 DIAGNOSIS — C50119 Malignant neoplasm of central portion of unspecified female breast: Secondary | ICD-10-CM

## 2012-06-21 DIAGNOSIS — Z170 Estrogen receptor positive status [ER+]: Secondary | ICD-10-CM

## 2012-06-21 DIAGNOSIS — C50919 Malignant neoplasm of unspecified site of unspecified female breast: Secondary | ICD-10-CM

## 2012-06-21 DIAGNOSIS — R7989 Other specified abnormal findings of blood chemistry: Secondary | ICD-10-CM

## 2012-06-21 MED ORDER — ANASTROZOLE 1 MG PO TABS: 1.0000 mg | ORAL_TABLET | Freq: Every day | ORAL | Status: DC

## 2012-06-21 NOTE — Telephone Encounter (Signed)
gv pt appt schedule for May 2015.  °

## 2012-06-21 NOTE — Progress Notes (Signed)
ID: [name]   DOB: 1950-12-14  MR#: [id number]  CSN#:[id number]  PCP: Yvonne Pitter, MD GYN: SUManus Rudd OTHER MD: [name]  HISTORY OF PRESENT ILLNESS: She had injured her right breast slightly and when that resolved, she noted a little bit of a dimple in the breast, and this concerned her so she not only made an appointment with Dr. Parke [name], but she scheduled herself at [facility] for mammograms.  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    With this information, she was referred to Dr. Lurene [name] who on 03-17-03 proceeded to right lumpectomy and sentinel lymph node biopsy.  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  Her final pathology report ([id number]) showed a 2.6 cm. Grade 2 with no evidence of lymphovascular invasion, negative margins and the sentinel lymph node not involved.    INTERVAL HISTORY: [name] returns for followup of her breast cancer. Interval history is significant for the death of her sister in the fall of 2013 (from congestive heart failure. She is still working for [name], which is about to be bought by [employer]. She tells me she has had "for knee replacements" since her last visit here  REVIEW OF SYSTEMS: She is tolerating the anastrozole with no side effects that she is aware of and in particular hot flashes, vaginal dryness, and arthralgias/myalgias are not significant issues for her. She is being treated for goiter and has been referred to Dr. Anthonette [name] for further evaluation. Otherwise a detailed review of systems today was unremarkable  PAST MEDICAL HISTORY: Past Medical History  Diagnosis Date  . Hypertension     takes Tribenzor daily  . Sleep apnea     has cpap but doesn't use;sleep study done 3-85yrs ago  . Joint pain   . Joint swelling   . Bruises easily   . Arthritis    . Cancer 2004    rt breast;takes Arimidex daily  . Hypothyroidism     takes Synthroid daily    PAST SURGICAL HISTORY: Past Surgical History  Procedure Laterality Date  . Breast lumpectomy  2004  . Portacath placement  2004  . Appendectomy  2007  . Knee arthroscopy  2007  . Anterior cervical decomp/discectomy fusion  2008  . Porta cath remove  2005  . Knee arthroscopy  05/02/2011    Procedure: ARTHROSCOPY KNEE;  Surgeon: Javier Docker;  Location: [facility];  Service: Orthopedics;  Laterality: Right;  . Meniscus debridement  05/02/2011    Procedure: DEBRIDEMENT OF MENISCUS;  Surgeon: Javier Docker;  Location: [facility];  Service: Orthopedics;  Laterality: Right;  . Portacath removal    . Colonoscopy    . Cesarean section  28yrs ago  . Colonoscopy    . Total knee arthroplasty  04/14/2012    Procedure: TOTAL KNEE ARTHROPLASTY;  Surgeon: Loreta Ave, MD;  Location[facility] OR;  Service: Orthopedics;  Laterality: Right;  RIGHT ARTHROPLASTY KNEE MEDIAL/LATERAL COMPARTMENTS WITH PATELLA RESURFACING    FAMILY HISTORY The patient's mother is alive at age 13.  She was diagnosed with breast cancer at age 62.  She is doing fine on Arimidex.  The patient's father died from liver cancer in his 73's.  The patient has one brother and two sisters with no other cancer in the family.  GYNECOLOGIC  HISTORY: GX P1.  Her pregnancy to term was age 57. 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  Her son, [name], lives in [place], is a Immunologist and she has a grandson, 82, living here in town and sometimes staying with her.  The patient is a member of [employer].   ADVANCED DIRECTIVES:  HEALTH MAINTENANCE: History  Substance Use Topics  . Smoking status: Never Smoker   . Smokeless tobacco: Not on file  . Alcohol Use: No     Colonoscopy:  PAP:  Bone density:  09/08/2011, normal  Lipid panel:  Allergies  Allergen Reactions  . Penicillins     childhood    Current Outpatient Prescriptions  Medication Sig Dispense Refill  . anastrozole (ARIMIDEX) 1 MG tablet Take 1 tablet (1 mg total) by mouth daily.  90 tablet  12  . enoxaparin (LOVENOX) 30 MG/0.3ML injection Inject 0.3 mLs (30 mg total) into the skin every 12 (twelve) hours.      Marland Kitchen levothyroxine (SYNTHROID, LEVOTHROID) 50 MCG tablet Take 50 mcg by mouth daily.      . methocarbamol (ROBAXIN) 500 MG tablet Take 1 tablet (500 mg total) by mouth every 6 (six) hours as needed (spasms).      . Olmesartan-Amlodipine-HCTZ (TRIBENZOR) 40-10-25 MG TABS Take 1 tablet by mouth daily.      Marland Kitchen oxyCODONE-acetaminophen (ROXICET) 5-325 MG per tablet Take 1-2 tablets by mouth every 4 (four) hours as needed for pain.  30 tablet  0  . warfarin (COUMADIN) 5 MG tablet Take 1 tablet (5 mg total) by mouth daily.       No current facility-administered medications for this visit.    OBJECTIVE: Middle-aged [place] American woman in no acute distress Filed Vitals:   06/21/12 1624  BP: 122/79  Pulse: 93  Temp: 98.4 F (36.9 C)  Resp: 20     Body mass index is 35.87 kg/(m^2).    ECOG FS: 0  Sclerae unicteric Oropharynx clear No peripheral adenopathy Lungs no rales or rhonchi Heart regular rate and rhythm Abd benign MSK no focal spinal tenderness Neuro: nonfocal, well oriented, pleasant affect Breasts: The right breast is status post lumpectomy and radiation. There is no evidence of local recurrence. The right axilla is benign. The left breast is unremarkable  LAB RESULTS: Lab Results  Component Value Date   WBC 5.2 06/14/2012   NEUTROABS 2.8 06/14/2012   HGB 11.7 06/14/2012   HCT 35.7 06/14/2012   MCV 84.8 06/14/2012   PLT 293 06/14/2012      Chemistry      Component Value Date/Time   NA 143 06/14/2012 1618   NA 143 04/16/2012 0600   K 3.2* 06/14/2012 1618   K 2.9* 04/16/2012 0600   CL 104 06/14/2012  1618   CL 105 04/16/2012 0600   CO2 28 06/14/2012 1618   CO2 27 04/16/2012 0600   BUN 11.6 06/14/2012 1618   BUN 7 04/16/2012 0600   CREATININE 1.0 06/14/2012 1618   CREATININE 0.87 04/16/2012 0600      Component Value Date/Time   CALCIUM 9.5 06/14/2012 1618   CALCIUM 8.5 04/16/2012 0600   ALKPHOS 108 06/14/2012 1618   ALKPHOS 96 04/14/2012 0801   AST 15 06/14/2012 1618   AST 18 04/14/2012 0801   ALT 14 06/14/2012 1618   ALT 18 04/14/2012 0801   BILITOT 0.32 06/14/2012 1618   BILITOT 0.4 04/14/2012 0801  Lab Results  Component Value Date   LABCA2 23 06/10/2011    No results found for this basename: INR,  in the last 168 hours  No results found for this basename: UACOL, UAPR, USPG, UPH, UTP, UGL, UKET, UBIL, UHGB, UNIT, UROB, ULEU, UEPI, UWBC, URBC, UBAC, CAST, CRYS, UCOM, BILUA,  in the last 72 hours   STUDIES: No results found. Repeat mammography is due July of 2013   ASSESSMENT: 62 y.o.  [place] woman status post right lumpectomy with sentinel node dissection November 2004 for a T2N1, stage IIB invasive ductal carcinoma, grade 2, ER/PR positive, HER-2/neu negative.  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  Status post radiation therapy completed June 2005.  Was started on tamoxifen at that time and continued for 5 years, after which she switched over to anastrozole.    PLAN: We will continue the anastrozole to June of 2015 and she will "graduate" at that point. I refilled her anastrozole and also wrote her for potassium, 20 mEq to take daily, since she has had a mildly low potassium fairly chronically, even though she is not on antihypertensives. Otherwise she knows to call for any problems before the next visit here, which will be may 2015.  Saori Umholtz C    06/21/2012

## 2012-06-24 ENCOUNTER — Other Ambulatory Visit: Payer: Self-pay | Admitting: *Deleted

## 2012-06-24 DIAGNOSIS — C50919 Malignant neoplasm of unspecified site of unspecified female breast: Secondary | ICD-10-CM

## 2012-06-24 MED ORDER — POTASSIUM CHLORIDE ER 10 MEQ PO CPCR: 20.0000 meq | ORAL_CAPSULE | Freq: Every day | ORAL | Status: DC

## 2012-06-24 MED ORDER — ANASTROZOLE 1 MG PO TABS: 1.0000 mg | ORAL_TABLET | Freq: Every day | ORAL | Status: DC

## 2012-07-29 ENCOUNTER — Other Ambulatory Visit: Payer: Self-pay | Admitting: Endocrinology

## 2012-07-29 DIAGNOSIS — E049 Nontoxic goiter, unspecified: Secondary | ICD-10-CM

## 2012-08-04 ENCOUNTER — Telehealth: Payer: Self-pay | Admitting: *Deleted

## 2012-08-04 ENCOUNTER — Other Ambulatory Visit: Payer: BC Managed Care – PPO

## 2012-08-04 ENCOUNTER — Encounter: Payer: Self-pay | Admitting: Internal Medicine

## 2012-08-04 ENCOUNTER — Ambulatory Visit (AMBULATORY_SURGERY_CENTER): Payer: BC Managed Care – PPO | Admitting: *Deleted

## 2012-08-04 VITALS — Ht 64.0 in | Wt 209.4 lb

## 2012-08-04 DIAGNOSIS — Z1211 Encounter for screening for malignant neoplasm of colon: Secondary | ICD-10-CM

## 2012-08-04 MED ORDER — MOVIPREP 100 G PO SOLR: ORAL | Status: DC

## 2012-08-04 NOTE — Telephone Encounter (Signed)
[name]: pt is scheduled for colonoscopy with Dr. Juanda Chance 09/01/12.  She says last colonoscopy was 5 years ago "somewhere" in [place].  She says she has information at home.  She is going to call back with MD's name tomorrow and ask for you.  I filled out release form and had her sign. I have put it on your desk.   Thanks, Olegario Messier

## 2012-08-04 NOTE — Telephone Encounter (Signed)
Noted  

## 2012-08-05 ENCOUNTER — Ambulatory Visit
Admission: RE | Admit: 2012-08-05 | Discharge: 2012-08-05 | Disposition: A | Discharge status: Home / Self Care | Payer: BC Managed Care – PPO | Source: Ambulatory Visit | Attending: Endocrinology | Admitting: Endocrinology

## 2012-08-05 DIAGNOSIS — E049 Nontoxic goiter, unspecified: Secondary | ICD-10-CM

## 2012-08-09 ENCOUNTER — Other Ambulatory Visit: Payer: Self-pay | Admitting: Endocrinology

## 2012-08-09 DIAGNOSIS — E042 Nontoxic multinodular goiter: Secondary | ICD-10-CM

## 2012-08-10 ENCOUNTER — Ambulatory Visit
Admission: RE | Admit: 2012-08-10 | Discharge: 2012-08-10 | Disposition: A | Discharge status: Home / Self Care | Payer: BC Managed Care – PPO | Source: Ambulatory Visit | Attending: Endocrinology | Admitting: Endocrinology

## 2012-08-10 ENCOUNTER — Other Ambulatory Visit (HOSPITAL_COMMUNITY)
Admission: RE | Admit: 2012-08-10 | Discharge: 2012-08-10 | Disposition: A | Discharge status: Home / Self Care | Payer: BC Managed Care – PPO | Source: Ambulatory Visit | Attending: Diagnostic Radiology | Admitting: Diagnostic Radiology

## 2012-08-10 DIAGNOSIS — E042 Nontoxic multinodular goiter: Secondary | ICD-10-CM

## 2012-08-10 DIAGNOSIS — E049 Nontoxic goiter, unspecified: Secondary | ICD-10-CM | POA: Insufficient documentation

## 2012-08-23 NOTE — Telephone Encounter (Signed)
Patient called back and cancelled colonoscopy saying that she had a colonoscopy within the last 5 years. No further information was given. ROI will be destroyed at this time.

## 2012-09-01 ENCOUNTER — Encounter: Payer: BC Managed Care – PPO | Admitting: Internal Medicine

## 2012-09-06 NOTE — H&P (Signed)
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 Discussed  Given the enlarging nature of this mass and the size and characteristics, I agreed that she would benefit with right thyroid lobectomy with frozen section and possible total thyroidectomy if it is cancer. All the details of the surgery were discussed at length. Risks of recurrent nerve injury and hypocalcemia were discussed. We will schedule this at her convenience. Reason For Visit  [name] is here today at the kind request of Balan, Bindubal for consultation and opinion for thyroid. HPI  One year history of known right thyroid nodule, slowly enlarging. Synthroid suppression has been unsuccessful. Recent FNA revealed atypical cells. She is referred here for evaluation of thyroid surgery. She has a history of cervical disc surgery with a left of midline vertical scar. Allergies  No Known Drug Allergies. Current Meds  Levothyroxine Sodium 50 MCG Oral Tablet;; RPT Hydrochlorothiazide 25 MG Oral Tablet;; RPT Potassium Chloride ER 10 MEQ Oral Tablet Extended Release;; RPT Anastrozole 1 MG Oral Tablet;; RPT Tribenzor 40-10-25 MG Oral Tablet;; RPT. Active Problems  Hypertension  (401.9) (I10). PMH  Personal history of malignant neoplasm of breast (V10.3) (Z85.3). PSH  Appendectomy Biopsy Breast Open Cesarean Section Knee Surgery Neck Surgery. Family Hx  Family history of cardiac disorder: Sister (V17.49) (Z39.49) Family history of liver cancer: Father (V16.0) (Z80.0) Family history of malignant neoplasm of breast: Mother (V16.3) (Z80.3) Family history of rheumatoid arthritis (V17.7) (Z82.61) Stroke syndrome: Sister (I67.89). Personal Hx  Caffeine use; 1 cup daily Never smoker Non-smoker (V49.89) (Z78.9) Social alcohol use. ROS  Systemic: Feeling tired (fatigue).  No fever  and no night sweats.  Recent weight loss. Head: No headache. Eyes: No eye symptoms. Otolaryngeal: No hearing loss, no earache, no tinnitus, and  no purulent nasal discharge.  No nasal passage blockage (stuffiness), no snoring, no sneezing, no hoarseness, and no sore throat. Cardiovascular: No chest pain or discomfort  and no palpitations. Pulmonary: No dyspnea, no cough, and no wheezing. Gastrointestinal: No dysphagia  and no heartburn.  No nausea, no abdominal pain, and no melena.  No diarrhea. Genitourinary: No dysuria. Endocrine: No muscle weakness. Musculoskeletal: Calf muscle cramps.  No arthralgias.  Soft tissue swelling. Neurological: No dizziness, no fainting, no tingling, and no numbness. Psychological: No anxiety  and no depression. Skin: No rash. 12 system ROS was obtained and reviewed on the Health Maintenance form dated today.  Positive responses are shown above.  If the symptom is not checked, the patient has denied it. Vital Signs   Recorded by Skolimowski,Sharon on 20 Aug 2012 01:27 PM BP:104/70,  Height: 64 in, Weight: 203 lb, BMI: 34.8 kg/m2,  BMI Calculated: 34.85 ,  BSA Calculated: 1.97. Physical Exam  APPEARANCE: Well developed, well nourished, in no acute distress.  Normal affect, in a pleasant mood.  Oriented to time, place and person. COMMUNICATION: Normal voice   HEAD & FACE:  No scars, lesions or masses of head and face.  Sinuses nontender to palpation.  Salivary glands without mass or tenderness.  Facial strength symmetric.  No facial lesion, scars, or mass. EYES: EOMI with normal primary gaze alignment. Visual acuity grossly intact.  PERRLA EXTERNAL EAR & NOSE: No scars, lesions or masses  EAC & TYMPANIC MEMBRANE:  EAC shows no obstructing lesions or debris and tympanic membranes are normal bilaterally with good movement to insufflation. GROSS HEARING: Normal  TMJ:  Nontender  INTRANASAL EXAM: No polyps or purulence.  NASOPHARYNX: Normal, without lesions. LIPS, TEETH & GUMS: No lip  lesions, normal dentition and normal gums. ORAL CAVITY/OROPHARYNX:  Oral mucosa moist without lesion or asymmetry of the  palate, tongue, tonsil or posterior pharynx. LARYNX (mirror exam):  No lesions of the epiglottis, false cord or TVC's and cords move well to phonation. HYPOPHARYNX (mirror exam): No lesions, asymmetry or pooling of secretions. NECK:  Supple without adenopathy or mass. Left of midline vertical scar. THYROID: 5-6 cm slightly firm mass replacing the right side, left side feels normal.  NEUROLOGIC:  No gross CN deficits. No nystagmus noted.   LYMPHATIC:  No enlarged nodes palpable. Signature  Electronically signed by : Serena Colonel  M.D.; 08/20/2012 2:02 PM EST.

## 2012-09-07 ENCOUNTER — Encounter (HOSPITAL_COMMUNITY): Payer: Self-pay | Admitting: Pharmacy Technician

## 2012-09-08 NOTE — Pre-Procedure Instructions (Addendum)
SHAQUANTA HARKLESS  09/08/2012   Your procedure is scheduled on:  Wednesday, May 28th  Report to [facility] Stay Center at 6:30AM.  Call this number if you have problems the morning of surgery: [phone]   Remember:   Do not eat food or drink liquids after midnight.    Take these medicines the morning of surgery with A SIP OF WATER: -levothyroxine (SYNTHROID, LEVOTHROID)   STOP anaprox  On 09/17/12    Do not wear jewelry, make-up or nail polish.  Do not wear lotions, powders, or perfumes. You may wear deodorant.  Do not shave 48 hours prior to surgery.  Do not bring valuables to the hospital.  Contacts, dentures or bridgework may not be worn into surgery.  Leave suitcase in the car. After surgery it may be brought to your room.  For patients admitted to the hospital, checkout time is 11:00 AM the day of discharge.   Patients discharged the day of surgery will not be allowed to drive home.  Name and phone number of your driver: -   Special Instructions: Shower using CHG 2 nights before surgery and the night before surgery.  If you shower the day of surgery use CHG.  Use special wash - you have one bottle of CHG for all showers.  You should use approximately 1/3 of the bottle for each shower.   Please read over the following fact sheets that you were given: Pain Booklet, Coughing and Deep Breathing and Surgical Site Infection Prevention

## 2012-09-09 ENCOUNTER — Encounter (HOSPITAL_COMMUNITY): Payer: Self-pay

## 2012-09-09 ENCOUNTER — Encounter (HOSPITAL_COMMUNITY)
Admission: RE | Admit: 2012-09-09 | Discharge: 2012-09-09 | Disposition: A | Discharge status: Home / Self Care | Payer: BC Managed Care – PPO | Source: Ambulatory Visit | Attending: Anesthesiology | Admitting: Anesthesiology

## 2012-09-09 ENCOUNTER — Encounter (HOSPITAL_COMMUNITY)
Admission: RE | Admit: 2012-09-09 | Discharge: 2012-09-09 | Disposition: A | Discharge status: Home / Self Care | Payer: BC Managed Care – PPO | Source: Ambulatory Visit | Attending: Otolaryngology | Admitting: Otolaryngology

## 2012-09-09 DIAGNOSIS — G4730 Sleep apnea, unspecified: Secondary | ICD-10-CM | POA: Insufficient documentation

## 2012-09-09 DIAGNOSIS — I10 Essential (primary) hypertension: Secondary | ICD-10-CM | POA: Insufficient documentation

## 2012-09-09 DIAGNOSIS — Z01812 Encounter for preprocedural laboratory examination: Secondary | ICD-10-CM | POA: Insufficient documentation

## 2012-09-09 DIAGNOSIS — Z01818 Encounter for other preprocedural examination: Secondary | ICD-10-CM | POA: Insufficient documentation

## 2012-09-09 LAB — BASIC METABOLIC PANEL
CO2: 27 mEq/L (ref 19–32)
Calcium: 10.1 mg/dL (ref 8.4–10.5)
Chloride: 99 mEq/L (ref 96–112)
Creatinine, Ser: 1 mg/dL (ref 0.50–1.10)
GFR calc Af Amer: 69 mL/min — ABNORMAL LOW (ref >90)
Sodium: 140 mEq/L (ref 135–145)

## 2012-09-09 LAB — SURGICAL PCR SCREEN
MRSA, PCR: NEGATIVE
Staphylococcus aureus: NEGATIVE

## 2012-09-09 LAB — CBC
MCV: 84.5 fL (ref 78.0–100.0)
Platelets: 270 10*3/uL (ref 150–400)
RBC: 4.14 MIL/uL (ref 3.87–5.11)
RDW: 12.2 % (ref 11.5–15.5)
WBC: 5.9 10*3/uL (ref 4.0–10.5)

## 2012-09-09 NOTE — Progress Notes (Signed)
ECHO, EKG 8/13

## 2012-09-10 ENCOUNTER — Other Ambulatory Visit (HOSPITAL_COMMUNITY): Payer: BC Managed Care – PPO

## 2012-09-21 MED ORDER — VANCOMYCIN HCL IN DEXTROSE 1-5 GM/200ML-% IV SOLN
1000.0000 mg | INTRAVENOUS | Status: AC
  Administered 2012-09-22: 1000 mg via INTRAVENOUS
  Filled 2012-09-21 (×2): qty 200

## 2012-09-22 ENCOUNTER — Encounter (HOSPITAL_COMMUNITY): Payer: Self-pay | Admitting: Anesthesiology

## 2012-09-22 ENCOUNTER — Observation Stay (HOSPITAL_COMMUNITY)
Admission: RE | Admit: 2012-09-22 | Discharge: 2012-09-23 | Disposition: A | Discharge status: Home / Self Care | Payer: BC Managed Care – PPO | Source: Ambulatory Visit | Attending: Otolaryngology | Admitting: Otolaryngology

## 2012-09-22 ENCOUNTER — Ambulatory Visit (HOSPITAL_COMMUNITY): Payer: BC Managed Care – PPO | Admitting: Anesthesiology

## 2012-09-22 ENCOUNTER — Encounter (HOSPITAL_COMMUNITY)
Admission: RE | Disposition: A | Discharge status: Home / Self Care | Payer: Self-pay | Source: Ambulatory Visit | Attending: Otolaryngology

## 2012-09-22 ENCOUNTER — Encounter (HOSPITAL_COMMUNITY): Payer: Self-pay | Admitting: Surgery

## 2012-09-22 DIAGNOSIS — E079 Disorder of thyroid, unspecified: Secondary | ICD-10-CM

## 2012-09-22 DIAGNOSIS — I10 Essential (primary) hypertension: Secondary | ICD-10-CM | POA: Insufficient documentation

## 2012-09-22 DIAGNOSIS — C73 Malignant neoplasm of thyroid gland: Principal | ICD-10-CM | POA: Insufficient documentation

## 2012-09-22 DIAGNOSIS — Z853 Personal history of malignant neoplasm of breast: Secondary | ICD-10-CM | POA: Insufficient documentation

## 2012-09-22 HISTORY — PX: THYROIDECTOMY: SHX17

## 2012-09-22 SURGERY — THYROIDECTOMY
Anesthesia: General | Site: Neck | Wound class: Clean

## 2012-09-22 MED ORDER — MIDAZOLAM HCL 5 MG/5ML IJ SOLN
INTRAMUSCULAR | Status: DC | PRN
  Administered 2012-09-22: 1 mg via INTRAVENOUS

## 2012-09-22 MED ORDER — LIDOCAINE HCL (CARDIAC) 20 MG/ML IV SOLN
INTRAVENOUS | Status: DC | PRN
  Administered 2012-09-22: 100 mg via INTRAVENOUS

## 2012-09-22 MED ORDER — ANASTROZOLE 1 MG PO TABS
1.0000 mg | ORAL_TABLET | Freq: Every day | ORAL | Status: DC
  Administered 2012-09-22 – 2012-09-23 (×2): 1 mg via ORAL
  Filled 2012-09-22 (×2): qty 1

## 2012-09-22 MED ORDER — LABETALOL HCL 5 MG/ML IV SOLN
INTRAVENOUS | Status: DC | PRN
  Administered 2012-09-22: 2.5 mg via INTRAVENOUS

## 2012-09-22 MED ORDER — IRBESARTAN 300 MG PO TABS
300.0000 mg | ORAL_TABLET | Freq: Every day | ORAL | Status: DC
  Administered 2012-09-22 – 2012-09-23 (×2): 300 mg via ORAL
  Filled 2012-09-22 (×2): qty 1

## 2012-09-22 MED ORDER — HYDROCODONE-ACETAMINOPHEN 5-325 MG PO TABS: 1.0000 | ORAL_TABLET | ORAL | Status: DC | PRN

## 2012-09-22 MED ORDER — DEXTROSE-NACL 5-0.9 % IV SOLN
INTRAVENOUS | Status: DC
  Administered 2012-09-22 – 2012-09-23 (×2): via INTRAVENOUS

## 2012-09-22 MED ORDER — OLMESARTAN-AMLODIPINE-HCTZ 40-10-25 MG PO TABS: 1.0000 | ORAL_TABLET | Freq: Every day | ORAL | Status: DC

## 2012-09-22 MED ORDER — PROMETHAZINE HCL 25 MG PO TABS: 25.0000 mg | ORAL_TABLET | Freq: Four times a day (QID) | ORAL | Status: DC | PRN

## 2012-09-22 MED ORDER — 0.9 % SODIUM CHLORIDE (POUR BTL) OPTIME
TOPICAL | Status: DC | PRN
  Administered 2012-09-22: 1000 mL

## 2012-09-22 MED ORDER — NAPROXEN SODIUM 220 MG PO TABS: 220.0000 mg | ORAL_TABLET | Freq: Every day | ORAL | Status: DC | PRN

## 2012-09-22 MED ORDER — PROMETHAZINE HCL 25 MG RE SUPP: 25.0000 mg | Freq: Four times a day (QID) | RECTAL | Status: DC | PRN

## 2012-09-22 MED ORDER — MEPERIDINE HCL 25 MG/ML IJ SOLN: 6.2500 mg | INTRAMUSCULAR | Status: DC | PRN

## 2012-09-22 MED ORDER — NAPROXEN 250 MG PO TABS: 250.0000 mg | ORAL_TABLET | Freq: Every day | ORAL | Status: DC | PRN

## 2012-09-22 MED ORDER — ONDANSETRON HCL 4 MG/2ML IJ SOLN
INTRAMUSCULAR | Status: DC | PRN
  Administered 2012-09-22: 4 mg via INTRAVENOUS

## 2012-09-22 MED ORDER — POTASSIUM CHLORIDE CRYS ER 10 MEQ PO TBCR
10.0000 meq | EXTENDED_RELEASE_TABLET | Freq: Two times a day (BID) | ORAL | Status: DC
  Administered 2012-09-22 – 2012-09-23 (×3): 10 meq via ORAL
  Filled 2012-09-22 (×4): qty 1

## 2012-09-22 MED ORDER — ADULT MULTIVITAMIN W/MINERALS CH
1.0000 | ORAL_TABLET | Freq: Every day | ORAL | Status: DC
  Administered 2012-09-22 – 2012-09-23 (×2): 1 via ORAL
  Filled 2012-09-22 (×2): qty 1

## 2012-09-22 MED ORDER — PROMETHAZINE HCL 25 MG/ML IJ SOLN
6.2500 mg | INTRAMUSCULAR | Status: DC | PRN
Start: 2012-09-22 — End: 2012-09-22
  Administered 2012-09-22: 6.25 mg via INTRAVENOUS

## 2012-09-22 MED ORDER — PROMETHAZINE HCL 25 MG/ML IJ SOLN
INTRAMUSCULAR | Status: AC
  Filled 2012-09-22: qty 1

## 2012-09-22 MED ORDER — HYDROCODONE-ACETAMINOPHEN 7.5-325 MG PO TABS: 1.0000 | ORAL_TABLET | Freq: Four times a day (QID) | ORAL | Status: DC | PRN

## 2012-09-22 MED ORDER — LIDOCAINE-EPINEPHRINE 1 %-1:100000 IJ SOLN
INTRAMUSCULAR | Status: AC
  Filled 2012-09-22: qty 1

## 2012-09-22 MED ORDER — FENTANYL CITRATE 0.05 MG/ML IJ SOLN
INTRAMUSCULAR | Status: DC | PRN
  Administered 2012-09-22: 50 ug via INTRAVENOUS
  Administered 2012-09-22: 100 ug via INTRAVENOUS
  Administered 2012-09-22: 125 ug via INTRAVENOUS
  Administered 2012-09-22: 100 ug via INTRAVENOUS
  Administered 2012-09-22: 25 ug via INTRAVENOUS

## 2012-09-22 MED ORDER — LEVOTHYROXINE SODIUM 50 MCG PO TABS
50.0000 ug | ORAL_TABLET | Freq: Every day | ORAL | Status: DC
  Administered 2012-09-23: 50 ug via ORAL
  Filled 2012-09-22 (×2): qty 1

## 2012-09-22 MED ORDER — LIDOCAINE-EPINEPHRINE 1 %-1:100000 IJ SOLN
INTRAMUSCULAR | Status: DC | PRN
  Administered 2012-09-22: 1 mL

## 2012-09-22 MED ORDER — ROCURONIUM BROMIDE 100 MG/10ML IV SOLN
INTRAVENOUS | Status: DC | PRN
  Administered 2012-09-22: 40 mg via INTRAVENOUS

## 2012-09-22 MED ORDER — LACTATED RINGERS IV SOLN
INTRAVENOUS | Status: DC | PRN
  Administered 2012-09-22 (×2): via INTRAVENOUS

## 2012-09-22 MED ORDER — GLYCOPYRROLATE 0.2 MG/ML IJ SOLN
INTRAMUSCULAR | Status: DC | PRN
  Administered 2012-09-22: .6 mg via INTRAVENOUS

## 2012-09-22 MED ORDER — PROPOFOL 10 MG/ML IV BOLUS
INTRAVENOUS | Status: DC | PRN
  Administered 2012-09-22: 50 mg via INTRAVENOUS
  Administered 2012-09-22: 150 mg via INTRAVENOUS

## 2012-09-22 MED ORDER — HYDROMORPHONE HCL PF 1 MG/ML IJ SOLN: 0.2500 mg | INTRAMUSCULAR | Status: DC | PRN

## 2012-09-22 MED ORDER — HYDROCHLOROTHIAZIDE 25 MG PO TABS
25.0000 mg | ORAL_TABLET | Freq: Every day | ORAL | Status: DC
  Administered 2012-09-22 – 2012-09-23 (×2): 25 mg via ORAL
  Filled 2012-09-22 (×2): qty 1

## 2012-09-22 MED ORDER — OXYCODONE HCL 5 MG/5ML PO SOLN: 5.0000 mg | Freq: Once | ORAL | Status: DC | PRN

## 2012-09-22 MED ORDER — OXYCODONE HCL 5 MG PO TABS: 5.0000 mg | ORAL_TABLET | Freq: Once | ORAL | Status: DC | PRN

## 2012-09-22 MED ORDER — NEOSTIGMINE METHYLSULFATE 1 MG/ML IJ SOLN
INTRAMUSCULAR | Status: DC | PRN
  Administered 2012-09-22: 4 mg via INTRAVENOUS

## 2012-09-22 MED ORDER — MIDAZOLAM HCL 2 MG/2ML IJ SOLN: 0.5000 mg | Freq: Once | INTRAMUSCULAR | Status: DC | PRN

## 2012-09-22 SURGICAL SUPPLY — 43 items
ADH SKN CLS APL DERMABOND .7 (GAUZE/BANDAGES/DRESSINGS) ×1
APPLIER CLIP 9.375 SM OPEN (CLIP)
APR CLP SM 9.3 20 MLT OPN (CLIP)
ATTRACTOMAT 16X20 MAGNETIC DRP (DRAPES) ×1 IMPLANT
CANISTER SUCTION 2500CC (MISCELLANEOUS) ×2 IMPLANT
CLEANER TIP ELECTROSURG 2X2 (MISCELLANEOUS) ×2 IMPLANT
CLIP APPLIE 9.375 SM OPEN (CLIP) IMPLANT
CLOTH BEACON ORANGE TIMEOUT ST (SAFETY) ×2 IMPLANT
CONT SPEC 4OZ CLIKSEAL STRL BL (MISCELLANEOUS) ×2 IMPLANT
CORDS BIPOLAR (ELECTRODE) ×2 IMPLANT
COVER SURGICAL LIGHT HANDLE (MISCELLANEOUS) ×2 IMPLANT
DECANTER SPIKE VIAL GLASS SM (MISCELLANEOUS) ×2 IMPLANT
DERMABOND ADVANCED (GAUZE/BANDAGES/DRESSINGS) ×1
DERMABOND ADVANCED .7 DNX12 (GAUZE/BANDAGES/DRESSINGS) ×1 IMPLANT
DRAIN SNY 10 ROU (WOUND CARE) ×1 IMPLANT
ELECT COATED BLADE 2.86 ST (ELECTRODE) ×2 IMPLANT
ELECT REM PT RETURN 9FT ADLT (ELECTROSURGICAL) ×2
ELECTRODE REM PT RTRN 9FT ADLT (ELECTROSURGICAL) ×1 IMPLANT
EVACUATOR SILICONE 100CC (DRAIN) ×2 IMPLANT
GAUZE SPONGE 4X4 16PLY XRAY LF (GAUZE/BANDAGES/DRESSINGS) IMPLANT
GLOVE BIO SURGEON STRL SZ 6.5 (GLOVE) IMPLANT
GLOVE ECLIPSE 7.5 STRL STRAW (GLOVE) ×2 IMPLANT
GOWN PREVENTION PLUS LG XLONG (DISPOSABLE) ×6 IMPLANT
KIT BASIN OR (CUSTOM PROCEDURE TRAY) ×2 IMPLANT
KIT ROOM TURNOVER OR (KITS) ×2 IMPLANT
NEEDLE 27GAX1X1/2 (NEEDLE) ×2 IMPLANT
NS IRRIG 1000ML POUR BTL (IV SOLUTION) ×2 IMPLANT
PAD ARMBOARD 7.5X6 YLW CONV (MISCELLANEOUS) ×4 IMPLANT
PENCIL FOOT CONTROL (ELECTRODE) ×2 IMPLANT
SPECIMEN JAR MEDIUM (MISCELLANEOUS) IMPLANT
SPONGE INTESTINAL PEANUT (DISPOSABLE) ×1 IMPLANT
STAPLER VISISTAT 35W (STAPLE) ×2 IMPLANT
SUT CHROMIC 3 0 SH 27 (SUTURE) IMPLANT
SUT CHROMIC 4 0 PS 2 18 (SUTURE) ×4 IMPLANT
SUT ETHILON 3 0 PS 1 (SUTURE) ×2 IMPLANT
SUT ETHILON 5 0 P 3 18 (SUTURE)
SUT NYLON ETHILON 5-0 P-3 1X18 (SUTURE) IMPLANT
SUT SILK 3 0 REEL (SUTURE) IMPLANT
SUT SILK 4 0 REEL (SUTURE) ×2 IMPLANT
TOWEL OR 17X24 6PK STRL BLUE (TOWEL DISPOSABLE) ×2 IMPLANT
TOWEL OR 17X26 10 PK STRL BLUE (TOWEL DISPOSABLE) ×2 IMPLANT
TRAY ENT MC OR (CUSTOM PROCEDURE TRAY) ×2 IMPLANT
WATER STERILE IRR 1000ML POUR (IV SOLUTION) IMPLANT

## 2012-09-22 NOTE — Transfer of Care (Signed)
Immediate Anesthesia Transfer of Care Note  Patient: [name]  Procedure(s) Performed: Procedure(s): RIGHT THYROID LOBECTOMY WITH FROZEN SECTION (N/A)  Patient Location: PACU  Anesthesia Type:General  Level of Consciousness: sedated  Airway & Oxygen Therapy: Patient Spontanous Breathing and Patient connected to face mask oxygen  Post-op Assessment: Report given to PACU RN and Post -op Vital signs reviewed and stable  Post vital signs: Reviewed and stable  Complications: No apparent anesthesia complications

## 2012-09-22 NOTE — Anesthesia Postprocedure Evaluation (Signed)
  Anesthesia Post-op Note  Patient: [name]  Procedure(s) Performed: Procedure(s): RIGHT THYROID LOBECTOMY WITH FROZEN SECTION (N/A)  Patient Location: PACU  Anesthesia Type:General  Level of Consciousness: awake, alert , oriented and patient cooperative  Airway and Oxygen Therapy: Patient Spontanous Breathing and Patient connected to nasal cannula oxygen  Post-op Pain: none  Post-op Assessment: Post-op Vital signs reviewed, Patient's Cardiovascular Status Stable, Respiratory Function Stable, Patent Airway, No signs of Nausea or vomiting and Pain level controlled  Post-op Vital Signs: Reviewed and stable  Complications: No apparent anesthesia complications

## 2012-09-22 NOTE — Op Note (Signed)
OPERATIVE REPORT  DATE OF SURGERY: 09/22/2012  PATIENT:  [name],  62 y.o. female  PRE-OPERATIVE DIAGNOSIS:  RIGHT THYROID NODULE  POST-OPERATIVE DIAGNOSIS:  right thyroid nodule  PROCEDURE:  Procedure(s): RIGHT THYROID LOBECTOMY WITH FROZEN SECTION  SURGEON:  Susy Frizzle, MD  ASSISTANTS: Aquilla Hacker, PA  ANESTHESIA:   General   EBL:  15 ml  DRAINS: 10 French round drain  LOCAL MEDICATIONS USED:  None  SPECIMEN:  Right thyroid lobe, frozen section, follicular lesion.  COUNTS:  Correct  PROCEDURE DETAILS: The patient was taken to the operating room and placed on the operating table in the supine position. A shoulder roll was placed beneath the shoulder blades and the neck was extended. The neck was prepped and draped in a standard fashion.  A previous oblique incision was used, a marking pen was used to outline the incision encompassing the entire scar with an extension along the right edge in a transverse fashion along a natural skin crease . Dissection was continued down through the platysma layer. Subplatysmal flaps were elevated superiorly to the thyroid cartilage and inferiorly to the clavicle. Self-retaining thyroid retractor was used throughout the case.  The midline fascia was divided.  in the right lobe was severely enlarged with a dominant mass. The gland was identified and the strap muscles were reflected laterally off of the right lobe. The superior vasculature was identified separately, ligated between clamps and divided. 4-0 silk ties were used throughout the case. The dissection continued right along the capsule of the gland. As the gland was rocked forward off of the lateral trachea the inferior vasculature was also separately identified, ligated between clamps and divided. The plane of dissection was away from the recurrent nerve. After the gland was dissected out, a separate dissection was then done in the tracheoesophageal groove just to identify and  confirm the position of the nerve. Parathyroids were not identified. Berry ligament was divided using electrocautery as the gland was brought forward off of the trachea. The gland was a little bit stuck to the tracheal cartilage and during this part of the dissection part of a tracheal ring was inadvertently partially dissected off but then this was corrected and the cartilage was left down in its place. The isthmus of the gland was divided using electrocautery. Specimen was sent for pathologic evaluation. The wound was irrigated with saline. There is no air leak. Hemostasis was completed using bipolar cautery. Palpation of the left lobe and the pretracheal tissue did not reveal any further pathology. The drain was placed and secured to the skin of the upper aspect of the incision using nylon suture. Chromic suture was used to reapproximate the midline fascia and the platysma layer. A subcuticular closure was done with chromic as well and Dermabond was used on the skin. The patient was awakened, extubated and transferred to recovery in stable condition    PATIENT DISPOSITION:  To PACU, stable

## 2012-09-22 NOTE — Progress Notes (Signed)
Received report from Phillip RN

## 2012-09-22 NOTE — Anesthesia Preprocedure Evaluation (Signed)
Anesthesia Evaluation  Patient identified by MRN, date of birth, ID band Patient awake    Reviewed: Allergy & Precautions, H&P , NPO status , Patient's Chart, lab work & pertinent test results  History of Anesthesia Complications Negative for: history of anesthetic complications  Airway Mallampati: I TM Distance: >3 FB Neck ROM: Full    Dental  (+) Caps, Dental Advisory Given and Missing   Pulmonary sleep apnea (patient declined to use CPAP) ,  breath sounds clear to auscultation  Pulmonary exam normal       Cardiovascular hypertension, Pt. on medications Rhythm:Regular Rate:Normal     Neuro/Psych    GI/Hepatic negative GI ROS, Neg liver ROS,   Endo/Other  Hypothyroidism Morbid obesity  Renal/GU negative Renal ROS     Musculoskeletal   Abdominal (+) + obese,   Peds  Hematology   Anesthesia Other Findings   Reproductive/Obstetrics                           Anesthesia Physical Anesthesia Plan  ASA: III  Anesthesia Plan: General   Post-op Pain Management:    Induction: Intravenous  Airway Management Planned: Oral ETT  Additional Equipment:   Intra-op Plan:   Post-operative Plan: Extubation in OR  Informed Consent: I have reviewed the patients History and Physical, chart, labs and discussed the procedure including the risks, benefits and alternatives for the proposed anesthesia with the patient or authorized representative who has indicated his/her understanding and acceptance.   Dental advisory given  Plan Discussed with: Surgeon and CRNA  Anesthesia Plan Comments: (Plan routine monitors, GETA)        Anesthesia Quick Evaluation

## 2012-09-22 NOTE — Preoperative (Signed)
Beta Blockers   Reason not to administer Beta Blockers:Not Applicable 

## 2012-09-22 NOTE — Anesthesia Procedure Notes (Signed)
Procedure Name: Intubation Date/Time: 09/22/2012 8:42 AM Performed by: Armandina Gemma Pre-anesthesia Checklist: Patient identified, Timeout performed, Emergency Drugs available, Suction available and Patient being monitored Patient Re-evaluated:Patient Re-evaluated prior to inductionOxygen Delivery Method: Circle system utilized Preoxygenation: Pre-oxygenation with 100% oxygen Intubation Type: IV induction Ventilation: Mask ventilation without difficulty Laryngoscope Size: Miller and 2 Grade View: Grade I Tube type: Oral Tube size: 7.0 mm Number of attempts: 1 Airway Equipment and Method: Stylet Placement Confirmation: ETT inserted through vocal cords under direct vision,  breath sounds checked- equal and bilateral and positive ETCO2 Secured at: 22 cm Tube secured with: Tape Dental Injury: Teeth and Oropharynx as per pre-operative assessment  Comments: IV induction Edwards - intubation AM CRNA atraumatic teeth and mouth as preop

## 2012-09-22 NOTE — Progress Notes (Signed)
   ENT Progress Note:  s/p Procedure(s): RIGHT THYROID LOBECTOMY WITH FROZEN SECTION   Subjective: Pt stable, min discomfort  Objective: Vital signs in last 24 hours: Temp:  [97.5 F (36.4 C)-98 F (36.7 C)] 97.8 F (36.6 C) (05/28 1330) Pulse Rate:  [66-90] 83 (05/28 1330) Resp:  [10-39] 20 (05/28 1330) BP: (96-150)/(66-89) 150/79 mmHg (05/28 1330) SpO2:  [95 %-100 %] 97 % (05/28 1330) Weight:  [106.958 kg (235 lb 12.8 oz)] 106.958 kg (235 lb 12.8 oz) (05/28 1330) Weight change:     Intake/Output from previous day:   Intake/Output this shift: Total I/O In: 1400 [I.V.:1400] Out: 140 [Urine:100; Drains:15; Blood:25]  Labs: No results found for this basename: WBC, HGB, HCT, PLT,  in the last 72 hours No results found for this basename: NA, K, CL, CO2, GLUCOSE, BUN, CREATININR, CALCIUM,  in the last 72 hours  Studies/Results: No results found.   PHYSICAL EXAM: Inc intact, no swelling Good voice, no airway concerns   Assessment/Plan: Monitor O/N Adv diet as tol    [name] 09/22/2012, 5:21 PM

## 2012-09-22 NOTE — Interval H&P Note (Signed)
History and Physical Interval Note:  09/22/2012 8:16 AM  [name]  has presented today for surgery, with the diagnosis of RIGHT THYROID NODULE  The various methods of treatment have been discussed with the patient and family. After consideration of risks, benefits and other options for treatment, the patient has consented to  Procedure(s): RIGHT THYROID LOBECTOMY WITH FROZEN SECTION, POSSIBLE TOTAL (N/A) as a surgical intervention .  The patient's history has been reviewed, patient examined, no change in status, stable for surgery.  I have reviewed the patient's chart and labs.  Questions were answered to the patient's satisfaction.     Arael Piccione

## 2012-09-23 NOTE — Discharge Summary (Signed)
Physician Discharge Summary  Patient ID: [name] MRN: [id number] DOB/AGE: May 03, 1950 62 y.o.  Admit date: 09/22/2012 Discharge date: 09/23/2012  Admission Diagnoses: Thyroid mass  Discharge Diagnoses:  Active Problems:   * No active hospital problems. *   Discharged Condition: good  Hospital Course: No complication  Consults: none  Significant Diagnostic Studies: none  Treatments: surgery: Thyroid lobectomy  Discharge Exam: Blood pressure 150/80, pulse 83, temperature 98.2 F (36.8 C), temperature source Oral, resp. rate 18, height 5\' 4"  (1.626 m), weight 235 lb 12.8 oz (106.958 kg), SpO2 100.00%. PHYSICAL EXAM: Awakened alert, voice normal. Surgical site looks excellent. Drain removed.  Disposition: 03-Skilled Nursing Facility  Discharge Orders   Future Appointments Provider Department Dept Phone   09/05/2013 4:00 PM [facility] [phone]   09/12/2013 4:00 PM Lowella Dell, MD [facility] MEDICAL ONCOLOGY [phone]   Future Orders Complete By Expires     Diet - low sodium heart healthy  As directed     Increase activity slowly  As directed         Medication List    TAKE these medications       anastrozole 1 MG tablet  Commonly known as:  ARIMIDEX  Take 1 mg by mouth daily.     HYDROcodone-acetaminophen 7.5-325 MG per tablet  Commonly known as:  NORCO  Take 1 tablet by mouth every 6 (six) hours as needed for pain.     levothyroxine 50 MCG tablet  Commonly known as:  SYNTHROID, LEVOTHROID  Take 50 mcg by mouth daily.     multivitamin with minerals Tabs  Take 1 tablet by mouth daily.     naproxen sodium 220 MG tablet  Commonly known as:  ANAPROX  Take 220 mg by mouth daily as needed (for pain).     potassium chloride 10 MEQ CR capsule  Commonly known as:  MICRO-K  Take 2 capsules (20 mEq total) by mouth daily.     promethazine 25 MG suppository  Commonly known as:   PHENERGAN  Place 1 suppository (25 mg total) rectally every 6 (six) hours as needed for nausea.     TRIBENZOR 40-10-25 MG Tabs  Generic drug:  Olmesartan-Amlodipine-HCTZ  Take 1 tablet by mouth daily.           Follow-up Information   Follow up with Serena Colonel, MD. Schedule an appointment as soon as possible for a visit in 1 week.   Contact information:   [address], [address] [place], [place] [address] [place] [address] [phone]       Signed: Serena Colonel 09/23/2012, 9:45 AM

## 2012-09-23 NOTE — Progress Notes (Signed)
Discharge home. Home discharge instruction given, no questions verbalized. 

## 2012-09-24 ENCOUNTER — Encounter (HOSPITAL_COMMUNITY): Payer: Self-pay | Admitting: Otolaryngology

## 2012-10-04 ENCOUNTER — Other Ambulatory Visit: Payer: Self-pay

## 2012-10-04 DIAGNOSIS — Z853 Personal history of malignant neoplasm of breast: Secondary | ICD-10-CM

## 2012-10-04 DIAGNOSIS — Z1231 Encounter for screening mammogram for malignant neoplasm of breast: Secondary | ICD-10-CM

## 2012-10-04 DIAGNOSIS — Z9889 Other specified postprocedural states: Secondary | ICD-10-CM

## 2012-10-12 ENCOUNTER — Encounter (HOSPITAL_COMMUNITY): Payer: Self-pay | Admitting: Pharmacy Technician

## 2012-10-14 NOTE — H&P (Signed)
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 Discussed  Given the enlarging nature of this mass and the size and characteristics, I agreed that she would benefit with right thyroid lobectomy with frozen section and possible total thyroidectomy if it is cancer. All the details of the surgery were discussed at length. Risks of recurrent nerve injury and hypocalcemia were discussed. We will schedule this at her convenience. Reason For Visit  [name] is here today at the kind request of Balan, Bindubal for consultation and opinion for thyroid. HPI  One year history of known right thyroid nodule, slowly enlarging. Synthroid suppression has been unsuccessful. Recent FNA revealed atypical cells. She is referred here for evaluation of thyroid surgery. She has a history of cervical disc surgery with a left of midline vertical scar. Allergies  No Known Drug Allergies. Current Meds  Levothyroxine Sodium 50 MCG Oral Tablet;; RPT Hydrochlorothiazide 25 MG Oral Tablet;; RPT Potassium Chloride ER 10 MEQ Oral Tablet Extended Release;; RPT Anastrozole 1 MG Oral Tablet;; RPT Tribenzor 40-10-25 MG Oral Tablet;; RPT. Active Problems  Hypertension  (401.9) (I10). PMH  Personal history of malignant neoplasm of breast (V10.3) (Z85.3). PSH  Appendectomy Biopsy Breast Open Cesarean Section Knee Surgery Neck Surgery. Family Hx  Family history of cardiac disorder: Sister (V17.49) (Z82.49) Family history of liver cancer: Father (V16.0) (Z80.0) Family history of malignant neoplasm of breast: Mother (V16.3) (Z80.3) Family history of rheumatoid arthritis (V17.7) (Z82.61) Stroke syndrome: Sister (I67.89). Personal Hx  Caffeine use; 1 cup daily Never smoker Non-smoker (V49.89) (Z78.9) Social alcohol use. ROS  Systemic: Feeling tired (fatigue).  No fever  and no night sweats.  Recent weight loss. Head: No headache. Eyes: No eye symptoms. Otolaryngeal: No hearing loss, no earache, no tinnitus, and  no purulent nasal discharge.  No nasal passage blockage (stuffiness), no snoring, no sneezing, no hoarseness, and no sore throat. Cardiovascular: No chest pain or discomfort  and no palpitations. Pulmonary: No dyspnea, no cough, and no wheezing. Gastrointestinal: No dysphagia  and no heartburn.  No nausea, no abdominal pain, and no melena.  No diarrhea. Genitourinary: No dysuria. Endocrine: No muscle weakness. Musculoskeletal: Calf muscle cramps.  No arthralgias.  Soft tissue swelling. Neurological: No dizziness, no fainting, no tingling, and no numbness. Psychological: No anxiety  and no depression. Skin: No rash. 12 system ROS was obtained and reviewed on the Health Maintenance form dated today.  Positive responses are shown above.  If the symptom is not checked, the patient has denied it. Vital Signs   Recorded by Skolimowski,Sharon on 20 Aug 2012 01:27 PM BP:104/70,  Height: 64 in, Weight: 203 lb, BMI: 34.8 kg/m2,  BMI Calculated: 34.85 ,  BSA Calculated: 1.97. Physical Exam  APPEARANCE: Well developed, well nourished, in no acute distress.  Normal affect, in a pleasant mood.  Oriented to time, place and person. COMMUNICATION: Normal voice   HEAD & FACE:  No scars, lesions or masses of head and face.  Sinuses nontender to palpation.  Salivary glands without mass or tenderness.  Facial strength symmetric.  No facial lesion, scars, or mass. EYES: EOMI with normal primary gaze alignment. Visual acuity grossly intact.  PERRLA EXTERNAL EAR & NOSE: No scars, lesions or masses  EAC & TYMPANIC MEMBRANE:  EAC shows no obstructing lesions or debris and tympanic membranes are normal bilaterally with good movement to insufflation. GROSS HEARING: Normal  TMJ:  Nontender  INTRANASAL EXAM: No polyps or purulence.  NASOPHARYNX: Normal, without lesions. LIPS, TEETH & GUMS: No lip   lesions, normal dentition and normal gums. ORAL CAVITY/OROPHARYNX:  Oral mucosa moist without lesion or asymmetry of the  palate, tongue, tonsil or posterior pharynx. LARYNX (mirror exam):  No lesions of the epiglottis, false cord or TVC's and cords move well to phonation. HYPOPHARYNX (mirror exam): No lesions, asymmetry or pooling of secretions. NECK:  Supple without adenopathy or mass. Left of midline vertical scar. THYROID: 5-6 cm slightly firm mass replacing the right side, left side feels normal.  NEUROLOGIC:  No gross CN deficits. No nystagmus noted.   LYMPHATIC:  No enlarged nodes palpable. Signature  Electronically signed by : Veralyn Lopp  M.D.; 08/20/2012 2:02 PM EST.  

## 2012-10-14 NOTE — Pre-Procedure Instructions (Signed)
[name]  10/14/2012   Your procedure is scheduled on:  Thurs, June 26 @ 8:20 AM  Report to [facility] at 6:20 AM.  Call this number if you have problems the morning of surgery: [phone]   Remember:   Do not eat food or drink liquids after midnight.   Take these medicines the morning of surgery with A SIP OF WATER: Levothyroxine(Synthroid)              No Aspirin,Goody's,BC's,Aleve,Ibuprofen,Fish Oil,or any Herbal Medications   Do not wear jewelry, make-up or nail polish.  Do not wear lotions, powders, or perfumes. You may wear deodorant.  Do not shave 48 hours prior to surgery.   Do not bring valuables to the hospital.  [facility] is not responsible                   for any belongings or valuables.  Contacts, dentures or bridgework may not be worn into surgery.  Leave suitcase in the car. After surgery it may be brought to your room.  For patients admitted to the hospital, checkout time is 11:00 AM the day of  discharge.   Patients discharged the day of surgery will not be allowed to drive  home.    Special Instructions: Shower using CHG 2 nights before surgery and the night before surgery.  If you shower the day of surgery use CHG.  Use special wash - you have one bottle of CHG for all showers.  You should use approximately 1/3 of the bottle for each shower.   Please read over the following fact sheets that you were given: Pain Booklet, Coughing and Deep Breathing, MRSA Information and Surgical Site Infection Prevention

## 2012-10-15 ENCOUNTER — Encounter (HOSPITAL_COMMUNITY)
Admission: RE | Admit: 2012-10-15 | Discharge: 2012-10-15 | Disposition: A | Discharge status: Home / Self Care | Payer: BC Managed Care – PPO | Source: Ambulatory Visit | Attending: Otolaryngology | Admitting: Otolaryngology

## 2012-10-15 ENCOUNTER — Encounter (HOSPITAL_COMMUNITY): Payer: Self-pay

## 2012-10-15 HISTORY — DX: Personal history of other diseases of the respiratory system: Z87.09

## 2012-10-15 HISTORY — DX: Pneumonia, unspecified organism: J18.9

## 2012-10-15 HISTORY — DX: Frequency of micturition: R35.0

## 2012-10-15 LAB — CBC
Platelets: 268 10*3/uL (ref 150–400)
RDW: 12.3 % (ref 11.5–15.5)
WBC: 4.6 10*3/uL (ref 4.0–10.5)

## 2012-10-15 LAB — BASIC METABOLIC PANEL
Calcium: 9.1 mg/dL (ref 8.4–10.5)
Creatinine, Ser: 0.88 mg/dL (ref 0.50–1.10)
GFR calc Af Amer: 81 mL/min — ABNORMAL LOW (ref >90)
Sodium: 142 mEq/L (ref 135–145)

## 2012-10-15 LAB — SURGICAL PCR SCREEN
MRSA, PCR: NEGATIVE
Staphylococcus aureus: NEGATIVE

## 2012-10-15 NOTE — Progress Notes (Signed)
Pt doesn't have a cardiologist  Several echo reports in epic  Stress test done around 2004  Denies ever having a heart cath  Dr.Bland is [facility]  CXR report in epic from 09-09-12  EKG in epic from 11-27-11

## 2012-10-20 MED ORDER — VANCOMYCIN HCL IN DEXTROSE 1-5 GM/200ML-% IV SOLN
1000.0000 mg | INTRAVENOUS | Status: AC
  Administered 2012-10-21: 1000 mg via INTRAVENOUS
  Filled 2012-10-20: qty 200

## 2012-10-21 ENCOUNTER — Ambulatory Visit (HOSPITAL_COMMUNITY): Payer: BC Managed Care – PPO | Admitting: Anesthesiology

## 2012-10-21 ENCOUNTER — Encounter (HOSPITAL_COMMUNITY): Payer: Self-pay | Admitting: Anesthesiology

## 2012-10-21 ENCOUNTER — Encounter (HOSPITAL_COMMUNITY): Payer: Self-pay | Admitting: *Deleted

## 2012-10-21 ENCOUNTER — Observation Stay (HOSPITAL_COMMUNITY)
Admission: RE | Admit: 2012-10-21 | Discharge: 2012-10-22 | Disposition: A | Discharge status: Home / Self Care | Payer: BC Managed Care – PPO | Source: Ambulatory Visit | Attending: Otolaryngology | Admitting: Otolaryngology

## 2012-10-21 ENCOUNTER — Encounter (HOSPITAL_COMMUNITY)
Admission: RE | Disposition: A | Discharge status: Home / Self Care | Payer: Self-pay | Source: Ambulatory Visit | Attending: Otolaryngology

## 2012-10-21 DIAGNOSIS — I10 Essential (primary) hypertension: Secondary | ICD-10-CM | POA: Insufficient documentation

## 2012-10-21 DIAGNOSIS — E041 Nontoxic single thyroid nodule: Secondary | ICD-10-CM | POA: Insufficient documentation

## 2012-10-21 DIAGNOSIS — C73 Malignant neoplasm of thyroid gland: Principal | ICD-10-CM | POA: Insufficient documentation

## 2012-10-21 DIAGNOSIS — E039 Hypothyroidism, unspecified: Secondary | ICD-10-CM | POA: Insufficient documentation

## 2012-10-21 DIAGNOSIS — Z01812 Encounter for preprocedural laboratory examination: Secondary | ICD-10-CM | POA: Insufficient documentation

## 2012-10-21 DIAGNOSIS — Z853 Personal history of malignant neoplasm of breast: Secondary | ICD-10-CM | POA: Insufficient documentation

## 2012-10-21 HISTORY — PX: THYROIDECTOMY: SHX17

## 2012-10-21 LAB — CALCIUM: Calcium: 8.9 mg/dL (ref 8.4–10.5)

## 2012-10-21 SURGERY — THYROIDECTOMY
Anesthesia: General | Site: Neck | Laterality: Left | Wound class: Clean

## 2012-10-21 MED ORDER — HYDROCODONE-ACETAMINOPHEN 7.5-325 MG PO TABS: 1.0000 | ORAL_TABLET | Freq: Four times a day (QID) | ORAL | Status: DC | PRN

## 2012-10-21 MED ORDER — DEXTROSE-NACL 5-0.9 % IV SOLN
INTRAVENOUS | Status: DC
  Administered 2012-10-21 – 2012-10-22 (×2): via INTRAVENOUS

## 2012-10-21 MED ORDER — ROCURONIUM BROMIDE 100 MG/10ML IV SOLN
INTRAVENOUS | Status: DC | PRN
  Administered 2012-10-21: 40 mg via INTRAVENOUS

## 2012-10-21 MED ORDER — OXYCODONE HCL 5 MG PO TABS: 5.0000 mg | ORAL_TABLET | Freq: Once | ORAL | Status: DC | PRN

## 2012-10-21 MED ORDER — PROMETHAZINE HCL 25 MG RE SUPP: 25.0000 mg | Freq: Four times a day (QID) | RECTAL | Status: DC | PRN

## 2012-10-21 MED ORDER — LEVOTHYROXINE SODIUM 100 MCG PO TABS: 100.0000 ug | ORAL_TABLET | Freq: Every day | ORAL | Status: DC

## 2012-10-21 MED ORDER — LIDOCAINE HCL 4 % MT SOLN
OROMUCOSAL | Status: DC | PRN
  Administered 2012-10-21: 4 mL via TOPICAL

## 2012-10-21 MED ORDER — FENTANYL CITRATE 0.05 MG/ML IJ SOLN
INTRAMUSCULAR | Status: DC | PRN
  Administered 2012-10-21 (×5): 50 ug via INTRAVENOUS

## 2012-10-21 MED ORDER — 0.9 % SODIUM CHLORIDE (POUR BTL) OPTIME
TOPICAL | Status: DC | PRN
  Administered 2012-10-21: 1000 mL

## 2012-10-21 MED ORDER — POTASSIUM CHLORIDE CRYS ER 10 MEQ PO TBCR
10.0000 meq | EXTENDED_RELEASE_TABLET | Freq: Two times a day (BID) | ORAL | Status: DC
  Administered 2012-10-21 – 2012-10-22 (×3): 10 meq via ORAL
  Filled 2012-10-21 (×4): qty 1

## 2012-10-21 MED ORDER — PHENYLEPHRINE HCL 10 MG/ML IJ SOLN
INTRAMUSCULAR | Status: DC | PRN
  Administered 2012-10-21 (×3): 40 ug via INTRAVENOUS

## 2012-10-21 MED ORDER — OLMESARTAN-AMLODIPINE-HCTZ 40-10-25 MG PO TABS: 0.5000 | ORAL_TABLET | Freq: Every day | ORAL | Status: DC

## 2012-10-21 MED ORDER — FENTANYL CITRATE 0.05 MG/ML IJ SOLN: 50.0000 ug | Freq: Once | INTRAMUSCULAR | Status: DC

## 2012-10-21 MED ORDER — IBUPROFEN 100 MG/5ML PO SUSP
400.0000 mg | Freq: Four times a day (QID) | ORAL | Status: DC | PRN
  Filled 2012-10-21: qty 20

## 2012-10-21 MED ORDER — HYDROCHLOROTHIAZIDE 12.5 MG PO CAPS
12.5000 mg | ORAL_CAPSULE | Freq: Every day | ORAL | Status: DC
  Administered 2012-10-21 – 2012-10-22 (×2): 12.5 mg via ORAL
  Filled 2012-10-21 (×2): qty 1

## 2012-10-21 MED ORDER — PROPOFOL 10 MG/ML IV BOLUS
INTRAVENOUS | Status: DC | PRN
  Administered 2012-10-21: 180 mg via INTRAVENOUS

## 2012-10-21 MED ORDER — NEOSTIGMINE METHYLSULFATE 1 MG/ML IJ SOLN
INTRAMUSCULAR | Status: DC | PRN
  Administered 2012-10-21: 4 mg via INTRAVENOUS

## 2012-10-21 MED ORDER — HYDROMORPHONE HCL PF 1 MG/ML IJ SOLN: 0.2500 mg | INTRAMUSCULAR | Status: DC | PRN

## 2012-10-21 MED ORDER — ONDANSETRON HCL 4 MG/2ML IJ SOLN
INTRAMUSCULAR | Status: DC | PRN
  Administered 2012-10-21: 4 mg via INTRAVENOUS

## 2012-10-21 MED ORDER — MIDAZOLAM HCL 2 MG/2ML IJ SOLN: 1.0000 mg | INTRAMUSCULAR | Status: DC | PRN

## 2012-10-21 MED ORDER — PROMETHAZINE HCL 25 MG/ML IJ SOLN
INTRAMUSCULAR | Status: AC
  Filled 2012-10-21: qty 1

## 2012-10-21 MED ORDER — IRBESARTAN 150 MG PO TABS
150.0000 mg | ORAL_TABLET | Freq: Every day | ORAL | Status: DC
  Administered 2012-10-21 – 2012-10-22 (×2): 150 mg via ORAL
  Filled 2012-10-21 (×2): qty 1

## 2012-10-21 MED ORDER — MIDAZOLAM HCL 5 MG/5ML IJ SOLN
INTRAMUSCULAR | Status: DC | PRN
  Administered 2012-10-21: 2 mg via INTRAVENOUS

## 2012-10-21 MED ORDER — LIDOCAINE-EPINEPHRINE 1 %-1:100000 IJ SOLN
INTRAMUSCULAR | Status: AC
  Filled 2012-10-21: qty 1

## 2012-10-21 MED ORDER — PROMETHAZINE HCL 25 MG PO TABS: 25.0000 mg | ORAL_TABLET | Freq: Four times a day (QID) | ORAL | Status: DC | PRN

## 2012-10-21 MED ORDER — LEVOTHYROXINE SODIUM 100 MCG PO TABS
100.0000 ug | ORAL_TABLET | Freq: Every day | ORAL | Status: DC
  Administered 2012-10-22: 100 ug via ORAL
  Filled 2012-10-21 (×2): qty 1

## 2012-10-21 MED ORDER — ARTIFICIAL TEARS OP OINT
TOPICAL_OINTMENT | OPHTHALMIC | Status: DC | PRN
  Administered 2012-10-21: 1 via OPHTHALMIC

## 2012-10-21 MED ORDER — HYDROCODONE-ACETAMINOPHEN 5-325 MG PO TABS: 1.0000 | ORAL_TABLET | ORAL | Status: DC | PRN

## 2012-10-21 MED ORDER — OXYCODONE HCL 5 MG/5ML PO SOLN: 5.0000 mg | Freq: Once | ORAL | Status: DC | PRN

## 2012-10-21 MED ORDER — GLYCOPYRROLATE 0.2 MG/ML IJ SOLN
INTRAMUSCULAR | Status: DC | PRN
  Administered 2012-10-21: 0.6 mg via INTRAVENOUS

## 2012-10-21 MED ORDER — LACTATED RINGERS IV SOLN
INTRAVENOUS | Status: DC | PRN
  Administered 2012-10-21: 08:00:00 via INTRAVENOUS

## 2012-10-21 MED ORDER — AMLODIPINE BESYLATE 5 MG PO TABS
5.0000 mg | ORAL_TABLET | Freq: Every day | ORAL | Status: DC
  Administered 2012-10-21 – 2012-10-22 (×2): 5 mg via ORAL
  Filled 2012-10-21 (×2): qty 1

## 2012-10-21 MED ORDER — NAPROXEN 250 MG PO TABS
250.0000 mg | ORAL_TABLET | Freq: Two times a day (BID) | ORAL | Status: DC
  Administered 2012-10-21 – 2012-10-22 (×2): 250 mg via ORAL
  Filled 2012-10-21 (×4): qty 1

## 2012-10-21 MED ORDER — ANASTROZOLE 1 MG PO TABS
1.0000 mg | ORAL_TABLET | Freq: Every day | ORAL | Status: DC
  Administered 2012-10-21 – 2012-10-22 (×2): 1 mg via ORAL
  Filled 2012-10-21 (×2): qty 1

## 2012-10-21 MED ORDER — LEVOTHYROXINE SODIUM 100 MCG IV SOLR: 100.0000 ug | Freq: Every day | INTRAVENOUS | Status: DC

## 2012-10-21 MED ORDER — NAPROXEN SODIUM 220 MG PO TABS: 220.0000 mg | ORAL_TABLET | Freq: Two times a day (BID) | ORAL | Status: DC

## 2012-10-21 MED ORDER — ADULT MULTIVITAMIN W/MINERALS CH
1.0000 | ORAL_TABLET | Freq: Every day | ORAL | Status: DC
  Administered 2012-10-21 – 2012-10-22 (×2): 1 via ORAL
  Filled 2012-10-21 (×2): qty 1

## 2012-10-21 MED ORDER — PROMETHAZINE HCL 25 MG/ML IJ SOLN: 6.2500 mg | INTRAMUSCULAR | Status: DC | PRN

## 2012-10-21 MED ORDER — LIDOCAINE HCL (CARDIAC) 20 MG/ML IV SOLN
INTRAVENOUS | Status: DC | PRN
  Administered 2012-10-21: 50 mg via INTRAVENOUS

## 2012-10-21 MED ORDER — BACITRACIN ZINC 500 UNIT/GM EX OINT
TOPICAL_OINTMENT | CUTANEOUS | Status: AC
  Filled 2012-10-21: qty 15

## 2012-10-21 SURGICAL SUPPLY — 47 items
ADH SKN CLS APL DERMABOND .7 (GAUZE/BANDAGES/DRESSINGS) ×1
APPLIER CLIP 9.375 SM OPEN (CLIP)
APR CLP SM 9.3 20 MLT OPN (CLIP)
CANISTER SUCTION 2500CC (MISCELLANEOUS) ×2 IMPLANT
CLEANER TIP ELECTROSURG 2X2 (MISCELLANEOUS) ×2 IMPLANT
CLIP APPLIE 9.375 SM OPEN (CLIP) IMPLANT
CLOTH BEACON ORANGE TIMEOUT ST (SAFETY) ×2 IMPLANT
CONT SPEC 4OZ CLIKSEAL STRL BL (MISCELLANEOUS) ×1 IMPLANT
CORDS BIPOLAR (ELECTRODE) ×2 IMPLANT
COVER SURGICAL LIGHT HANDLE (MISCELLANEOUS) ×2 IMPLANT
DECANTER SPIKE VIAL GLASS SM (MISCELLANEOUS) ×1 IMPLANT
DERMABOND ADVANCED (GAUZE/BANDAGES/DRESSINGS) ×1
DERMABOND ADVANCED .7 DNX12 (GAUZE/BANDAGES/DRESSINGS) ×1 IMPLANT
DRAIN JACKSON RD 7FR 3/32 (WOUND CARE) ×1 IMPLANT
DRAIN SNY 10 ROU (WOUND CARE) IMPLANT
ELECT COATED BLADE 2.86 ST (ELECTRODE) ×2 IMPLANT
ELECT REM PT RETURN 9FT ADLT (ELECTROSURGICAL) ×2
ELECTRODE REM PT RTRN 9FT ADLT (ELECTROSURGICAL) ×1 IMPLANT
EVACUATOR SILICONE 100CC (DRAIN) ×2 IMPLANT
GAUZE SPONGE 4X4 16PLY XRAY LF (GAUZE/BANDAGES/DRESSINGS) ×2 IMPLANT
GLOVE BIO SURGEON STRL SZ 6.5 (GLOVE) ×1 IMPLANT
GLOVE BIOGEL PI IND STRL 6.5 (GLOVE) IMPLANT
GLOVE BIOGEL PI INDICATOR 6.5 (GLOVE) ×1
GLOVE ECLIPSE 7.5 STRL STRAW (GLOVE) ×2 IMPLANT
GLOVE SURG SS PI 6.5 STRL IVOR (GLOVE) ×2 IMPLANT
GOWN PREVENTION PLUS LG XLONG (DISPOSABLE) ×3 IMPLANT
GOWN STRL NON-REIN LRG LVL3 (GOWN DISPOSABLE) ×4 IMPLANT
KIT BASIN OR (CUSTOM PROCEDURE TRAY) ×2 IMPLANT
KIT ROOM TURNOVER OR (KITS) ×2 IMPLANT
NEEDLE 27GAX1X1/2 (NEEDLE) ×2 IMPLANT
NS IRRIG 1000ML POUR BTL (IV SOLUTION) ×2 IMPLANT
PAD ARMBOARD 7.5X6 YLW CONV (MISCELLANEOUS) ×4 IMPLANT
PENCIL FOOT CONTROL (ELECTRODE) ×2 IMPLANT
SPECIMEN JAR SMALL (MISCELLANEOUS) ×1 IMPLANT
SPONGE INTESTINAL PEANUT (DISPOSABLE) IMPLANT
STAPLER VISISTAT 35W (STAPLE) ×2 IMPLANT
SUT CHROMIC 3 0 SH 27 (SUTURE) IMPLANT
SUT CHROMIC 4 0 PS 2 18 (SUTURE) ×4 IMPLANT
SUT ETHILON 3 0 PS 1 (SUTURE) ×2 IMPLANT
SUT ETHILON 5 0 P 3 18 (SUTURE)
SUT NYLON ETHILON 5-0 P-3 1X18 (SUTURE) IMPLANT
SUT SILK 3 0 REEL (SUTURE) IMPLANT
SUT SILK 4 0 REEL (SUTURE) ×2 IMPLANT
TAPE HY-TAPE 1X5Y PINK NS LF (GAUZE/BANDAGES/DRESSINGS) ×2 IMPLANT
TOWEL OR 17X24 6PK STRL BLUE (TOWEL DISPOSABLE) ×2 IMPLANT
TOWEL OR 17X26 10 PK STRL BLUE (TOWEL DISPOSABLE) ×2 IMPLANT
TRAY ENT MC OR (CUSTOM PROCEDURE TRAY) ×2 IMPLANT

## 2012-10-21 NOTE — Anesthesia Procedure Notes (Signed)
Procedure Name: Intubation Date/Time: 10/21/2012 8:14 AM Performed by: Luster Landsberg Pre-anesthesia Checklist: Patient identified, Emergency Drugs available, Suction available and Patient being monitored Patient Re-evaluated:Patient Re-evaluated prior to inductionOxygen Delivery Method: Circle system utilized Preoxygenation: Pre-oxygenation with 100% oxygen Intubation Type: IV induction Ventilation: Mask ventilation without difficulty and Oral airway inserted - appropriate to patient size Laryngoscope Size: Mac and 3 Grade View: Grade I Tube type: Oral Tube size: 7.5 mm Number of attempts: 1 Airway Equipment and Method: Stylet and LTA kit utilized Placement Confirmation: ETT inserted through vocal cords under direct vision,  breath sounds checked- equal and bilateral and positive ETCO2 Secured at: 22 cm Tube secured with: Tape Dental Injury: Teeth and Oropharynx as per pre-operative assessment

## 2012-10-21 NOTE — Anesthesia Postprocedure Evaluation (Signed)
  Anesthesia Post-op Note  Patient: [name]  Procedure(s) Performed: Procedure(s): COMPLETION OF THYROIDECTOMY (Left)  Patient Location: PACU  Anesthesia Type:General  Level of Consciousness: awake  Airway and Oxygen Therapy: Patient Spontanous Breathing  Post-op Pain: mild  Post-op Assessment: Post-op Vital signs reviewed, Patient's Cardiovascular Status Stable, Respiratory Function Stable, Patent Airway, No signs of Nausea or vomiting and Pain level controlled  Post-op Vital Signs: stable  Complications: No apparent anesthesia complications

## 2012-10-21 NOTE — Op Note (Signed)
OPERATIVE REPORT  DATE OF SURGERY: 10/21/2012  PATIENT:  [name],  62 y.o. female  PRE-OPERATIVE DIAGNOSIS:  THYROID CANCER  POST-OPERATIVE DIAGNOSIS:  THYROID CANCER  PROCEDURE:  Procedure(s): COMPLETION OF THYROIDECTOMY  SURGEON:  Susy Frizzle, MD  ASSISTANTS: Aquilla Hacker, PA  ANESTHESIA:   General   EBL:  20 ml  DRAINS: 7 French round J-P  LOCAL MEDICATIONS USED:  None  SPECIMEN:  none  COUNTS:  Correct  PROCEDURE DETAILS: The patient was taken to the operating room and placed on the operating table in the supine position. A shoulder roll was placed beneath the shoulder blades and the neck was extended. The neck was prepped and draped in a standard fashion. The previous incision was used. Electrocautery was used to incise the skin and subcutaneous tissue down through all of the scar tissue. The upper limb of the scar was actually removed.. Dissection was continued down through the platysma layer. Subplatysmal flaps were elevated superiorly to the thyroid cartilage and inferiorly to the clavicle. Self-retaining  retractor was used throughout the case.  The midline fascia was divided. The left lobe of the thyroid was identified and was reflected medially with Allis forceps. It was very small but there was a firm small mass within. The superior vasculature was identified, ligated between clamps and divided. 4-0 silk ties were used. The middle thyroid vein was similarly treated. Inferior vasculature was ligated as well. The gland was brought forward off of the trachea. The recurrent nerve was identified and preserved. Parathyroids were not specifically identified during the dissection. The specimen was removed and sent for pathologic evaluation. The wound was irrigated with saline. The drain was placed and secured to the skin of the right side of the incision. The midline fascia and platysmal layer as well as a subcuticular closure was accomplished using 4-0 chromic  suture. Dermabond was used on the skin. The patient was awakened extubated and transferred to recovery in stable condition.   PATIENT DISPOSITION:  To PACU, stable

## 2012-10-21 NOTE — Anesthesia Preprocedure Evaluation (Signed)
Anesthesia Evaluation  Patient identified by MRN, date of birth, ID band Patient awake    Reviewed: Allergy & Precautions, H&P , NPO status , Patient's Chart, lab work & pertinent test results  History of Anesthesia Complications Negative for: history of anesthetic complications  Airway Mallampati: I TM Distance: >3 FB Neck ROM: Full    Dental  (+) Caps, Dental Advisory Given and Missing   Pulmonary sleep apnea (patient declined to use CPAP) ,  breath sounds clear to auscultation  Pulmonary exam normal       Cardiovascular hypertension, Pt. on medications Rhythm:Regular Rate:Normal     Neuro/Psych    GI/Hepatic negative GI ROS, Neg liver ROS,   Endo/Other  Hypothyroidism Morbid obesity  Renal/GU negative Renal ROS     Musculoskeletal   Abdominal (+) + obese,   Peds  Hematology   Anesthesia Other Findings   Reproductive/Obstetrics                           Anesthesia Physical Anesthesia Plan  ASA: II  Anesthesia Plan: General   Post-op Pain Management:    Induction: Intravenous  Airway Management Planned: Oral ETT  Additional Equipment:   Intra-op Plan:   Post-operative Plan: Extubation in OR  Informed Consent: I have reviewed the patients History and Physical, chart, labs and discussed the procedure including the risks, benefits and alternatives for the proposed anesthesia with the patient or authorized representative who has indicated his/her understanding and acceptance.     Plan Discussed with: CRNA and Surgeon  Anesthesia Plan Comments:         Anesthesia Quick Evaluation

## 2012-10-21 NOTE — Progress Notes (Signed)
Subjective: Doing well, no complaints.  Objective: Vital signs in last 24 hours: Temp:  [97.2 F (36.2 C)-97.9 F (36.6 C)] 97.2 F (36.2 C) (06/26 1148) Pulse Rate:  [60-94] 94 (06/26 1148) Resp:  [13-18] 16 (06/26 1148) BP: (115-151)/(63-82) 151/76 mmHg (06/26 1148) SpO2:  [96 %-100 %] 100 % (06/26 1148) Weight:  [220 lb (99.791 kg)] 220 lb (99.791 kg) (06/26 1148) Weight change:  Last BM Date: 10/20/12  Intake/Output from previous day:   Intake/Output this shift: Total I/O In: 1322.5 [P.O.:480; I.V.:842.5] Out: 20 [Blood:20]  PHYSICAL EXAM: Incision clean dry and intact. Voice strong but slightly raspy.  Lab Results: No results found for this basename: WBC, HGB, HCT, PLT,  in the last 72 hours BMET  Recent Labs  10/21/12 1200  CALCIUM 8.9    Studies/Results: No results found.  Medications: I have reviewed the patient's current medications.  Assessment/Plan: Stable post op. Overnight obs.   LOS: 0 days   [name] 10/21/2012, 4:49 PM

## 2012-10-21 NOTE — Transfer of Care (Signed)
Immediate Anesthesia Transfer of Care Note  Patient: [name]  Procedure(s) Performed: Procedure(s): COMPLETION OF THYROIDECTOMY (Left)  Patient Location: PACU  Anesthesia Type:General  Level of Consciousness: awake, alert  and oriented  Airway & Oxygen Therapy: Patient Spontanous Breathing and Patient connected to nasal cannula oxygen  Post-op Assessment: Report given to PACU RN, Post -op Vital signs reviewed and stable and Patient moving all extremities  Post vital signs: Reviewed and stable  Complications: No apparent anesthesia complications

## 2012-10-21 NOTE — Interval H&P Note (Signed)
History and Physical Interval Note:  10/21/2012 7:15 AM  [name]  has presented today for surgery, with the diagnosis of THYROID CANCER  The various methods of treatment have been discussed with the patient and family. After consideration of risks, benefits and other options for treatment, the patient has consented to  Procedure(s): COMPLETION OF THYROIDECTOMY (Left) as a surgical intervention .  The patient's history has been reviewed, patient examined, no change in status, stable for surgery.  I have reviewed the patient's chart and labs.  Questions were answered to the patient's satisfaction.     [name]

## 2012-10-22 ENCOUNTER — Encounter (HOSPITAL_COMMUNITY): Payer: Self-pay | Admitting: Otolaryngology

## 2012-10-22 NOTE — Discharge Summary (Signed)
Physician Discharge Summary  Patient ID: [name] MRN: [id number] DOB/AGE: 62/29/1952 62 y.o.  Admit date: 10/21/2012 Discharge date: 10/22/2012  Admission Diagnoses: Thyroid cancer  Discharge Diagnoses:  Active Problems:   * No active hospital problems. *   Discharged Condition: good  Hospital Course: no complications  Consults: none  Significant Diagnostic Studies: none  Treatments: surgery: completion thyroidectomy  Discharge Exam: Blood pressure 126/65, pulse 88, temperature 98 F (36.7 C), temperature source Oral, resp. rate 16, height 5\' 4"  (1.626 m), weight 220 lb (99.791 kg), SpO2 96.00%. PHYSICAL EXAM: Voice strong, Calcium normal. JP removed, neck healthy.  Disposition: 01-Home or Self Care  Discharge Orders   Future Appointments Provider Department Dept Phone   11/12/2012 10:10 AM [facility]  IMAGING [phone]   Patient should wear two piece clothing and wear no powder or deodorant. Patient should arrive 15 minutes early.   09/05/2013 4:00 PM [facility] MEDICAL ONCOLOGY [phone]   09/12/2013 4:00 PM Lowella Dell, MD [facility] MEDICAL ONCOLOGY [phone]   Future Orders Complete By Expires     Diet - low sodium heart healthy  As directed     Increase activity slowly  As directed         Medication List    TAKE these medications       ALEVE PO  Take 1 tablet by mouth daily as needed (pain).     anastrozole 1 MG tablet  Commonly known as:  ARIMIDEX  Take 1 mg by mouth daily.     HYDROcodone-acetaminophen 7.5-325 MG per tablet  Commonly known as:  NORCO  Take 1 tablet by mouth every 6 (six) hours as needed for pain.     levothyroxine 100 MCG tablet  Commonly known as:  SYNTHROID  Take 1 tablet (100 mcg total) by mouth daily.     multivitamin with minerals Tabs  Take 1 tablet by mouth daily.     potassium chloride 10 MEQ CR capsule  Commonly known as:   MICRO-K  Take 2 capsules (20 mEq total) by mouth daily.     promethazine 25 MG suppository  Commonly known as:  PHENERGAN  Place 1 suppository (25 mg total) rectally every 6 (six) hours as needed for nausea.     TRIBENZOR 40-10-25 MG Tabs  Generic drug:  Olmesartan-Amlodipine-HCTZ  Take 0.5 tablets by mouth daily.           Follow-up Information   Follow up with Serena Colonel, MD. Call in 1 week.   Contact information:   [address] [place], [place] [address] [place] [address] [phone]       Signed: Serena Colonel 10/22/2012, 8:10 AM

## 2012-10-22 NOTE — Progress Notes (Signed)
Patient discharged to home with instructions. 

## 2012-10-27 ENCOUNTER — Other Ambulatory Visit (HOSPITAL_COMMUNITY): Payer: Self-pay | Admitting: Endocrinology

## 2012-10-27 DIAGNOSIS — C73 Malignant neoplasm of thyroid gland: Secondary | ICD-10-CM

## 2012-11-08 ENCOUNTER — Encounter (HOSPITAL_COMMUNITY)
Admission: RE | Admit: 2012-11-08 | Discharge: 2012-11-08 | Disposition: A | Discharge status: Home / Self Care | Payer: BC Managed Care – PPO | Source: Ambulatory Visit | Attending: Endocrinology | Admitting: Endocrinology

## 2012-11-08 DIAGNOSIS — C73 Malignant neoplasm of thyroid gland: Secondary | ICD-10-CM | POA: Insufficient documentation

## 2012-11-08 MED ORDER — THYROTROPIN ALFA 1.1 MG IM SOLR
0.9000 mg | INTRAMUSCULAR | Status: AC
  Administered 2012-11-08: 0.9 mg via INTRAMUSCULAR

## 2012-11-09 ENCOUNTER — Encounter (HOSPITAL_COMMUNITY): Payer: BC Managed Care – PPO

## 2012-11-09 MED ORDER — THYROTROPIN ALFA 1.1 MG IM SOLR
0.9000 mg | INTRAMUSCULAR | Status: AC
  Administered 2012-11-09: 0.9 mg via INTRAMUSCULAR

## 2012-11-10 ENCOUNTER — Encounter (HOSPITAL_COMMUNITY)
Admission: RE | Admit: 2012-11-10 | Discharge: 2012-11-10 | Disposition: A | Discharge status: Home / Self Care | Payer: BC Managed Care – PPO | Source: Ambulatory Visit | Attending: Endocrinology | Admitting: Endocrinology

## 2012-11-10 MED ORDER — SODIUM IODIDE I 131 CAPSULE
105.1000 | Freq: Once | INTRAVENOUS | Status: AC | PRN
  Administered 2012-11-10: 105.1 via ORAL

## 2012-11-12 ENCOUNTER — Ambulatory Visit: Payer: BC Managed Care – PPO

## 2012-11-19 ENCOUNTER — Encounter (HOSPITAL_COMMUNITY)
Admission: RE | Admit: 2012-11-19 | Discharge: 2012-11-19 | Disposition: A | Discharge status: Home / Self Care | Payer: BC Managed Care – PPO | Source: Ambulatory Visit | Attending: Endocrinology | Admitting: Endocrinology

## 2012-11-19 DIAGNOSIS — C73 Malignant neoplasm of thyroid gland: Secondary | ICD-10-CM | POA: Insufficient documentation

## 2012-12-16 ENCOUNTER — Ambulatory Visit: Payer: BC Managed Care – PPO

## 2013-01-04 ENCOUNTER — Ambulatory Visit
Admission: RE | Admit: 2013-01-04 | Discharge: 2013-01-04 | Disposition: A | Discharge status: Home / Self Care | Payer: BC Managed Care – PPO | Source: Ambulatory Visit

## 2013-01-04 DIAGNOSIS — Z853 Personal history of malignant neoplasm of breast: Secondary | ICD-10-CM

## 2013-01-04 DIAGNOSIS — Z9889 Other specified postprocedural states: Secondary | ICD-10-CM

## 2013-01-04 DIAGNOSIS — Z1231 Encounter for screening mammogram for malignant neoplasm of breast: Secondary | ICD-10-CM

## 2013-03-03 ENCOUNTER — Other Ambulatory Visit: Payer: Self-pay

## 2013-03-15 ENCOUNTER — Encounter (HOSPITAL_COMMUNITY): Payer: Self-pay | Admitting: Pharmacy Technician

## 2013-03-16 ENCOUNTER — Telehealth: Payer: Self-pay | Admitting: *Deleted

## 2013-03-16 NOTE — Telephone Encounter (Signed)
Called pharmacy to refill Potassium 10 MEQ. Pharmacist said they are out of stock today but will have more tomorrow. Will call patient to let her know to pick up tomorrow

## 2013-03-18 ENCOUNTER — Other Ambulatory Visit: Payer: Self-pay | Admitting: Orthopedic Surgery

## 2013-03-21 NOTE — Pre-Procedure Instructions (Signed)
[name]  03/21/2013   Your procedure is scheduled on:  Wed, Dec 3 @ 11:30 AM  Report to [facility] Stay [facility]trance A at 8:30 AM.  Call this number if you have problems the morning of surgery: [phone]   Remember:   Do not eat food or drink liquids after midnight.   Take these medicines the morning of surgery with A SIP OF WATER: Arimidex(Anastrozole),Pain Pill(if needed),Synthroid(Levothyroxine),and Promethazine(Phenergan-if needed)               Stop taking your Aleve 7 days prior to surgery.No Goody's,BC's,Aspirin,Ibuprofen,Fish Oil,or any Herbal Medications   Do not wear jewelry, make-up or nail polish.  Do not wear lotions, powders, or perfumes. You may wear deodorant.  Do not shave 48 hours prior to surgery.   Do not bring valuables to the hospital.  [facility] is not responsible                  for any belongings or valuables.               Contacts, dentures or bridgework may not be worn into surgery.  Leave suitcase in the car. After surgery it may be brought to your room.  For patients admitted to the hospital, discharge time is determined by your                treatment team.                  Special Instructions: Shower using CHG 2 nights before surgery and the night before surgery.  If you shower the day of surgery use CHG.  Use special wash - you have one bottle of CHG for all showers.  You should use approximately 1/3 of the bottle for each shower.   Please read over the following fact sheets that you were given: Pain Booklet, Coughing and Deep Breathing, Blood Transfusion Information, MRSA Information and Surgical Site Infection Prevention

## 2013-03-22 ENCOUNTER — Encounter (HOSPITAL_COMMUNITY): Payer: Self-pay

## 2013-03-22 ENCOUNTER — Encounter (HOSPITAL_COMMUNITY)
Admission: RE | Admit: 2013-03-22 | Discharge: 2013-03-22 | Disposition: A | Discharge status: Home / Self Care | Payer: BC Managed Care – PPO | Source: Ambulatory Visit | Attending: Orthopedic Surgery | Admitting: Orthopedic Surgery

## 2013-03-22 DIAGNOSIS — Z01812 Encounter for preprocedural laboratory examination: Secondary | ICD-10-CM | POA: Insufficient documentation

## 2013-03-22 DIAGNOSIS — Z01810 Encounter for preprocedural cardiovascular examination: Secondary | ICD-10-CM | POA: Insufficient documentation

## 2013-03-22 DIAGNOSIS — Z01818 Encounter for other preprocedural examination: Secondary | ICD-10-CM | POA: Insufficient documentation

## 2013-03-22 LAB — SURGICAL PCR SCREEN: Staphylococcus aureus: NEGATIVE

## 2013-03-22 LAB — BASIC METABOLIC PANEL
CO2: 26 mEq/L (ref 19–32)
Calcium: 9.6 mg/dL (ref 8.4–10.5)
Creatinine, Ser: 0.96 mg/dL (ref 0.50–1.10)
GFR calc Af Amer: 72 mL/min — ABNORMAL LOW (ref >90)
GFR calc non Af Amer: 63 mL/min — ABNORMAL LOW (ref >90)
Potassium: 3.3 mEq/L — ABNORMAL LOW (ref 3.5–5.1)
Sodium: 140 mEq/L (ref 135–145)

## 2013-03-22 LAB — CBC
HCT: 33.6 % — ABNORMAL LOW (ref 36.0–46.0)
Hemoglobin: 11.5 g/dL — ABNORMAL LOW (ref 12.0–15.0)
MCH: 28.7 pg (ref 26.0–34.0)
MCHC: 34.2 g/dL (ref 30.0–36.0)
Platelets: 251 10*3/uL (ref 150–400)
RBC: 4.01 MIL/uL (ref 3.87–5.11)

## 2013-03-22 LAB — TYPE AND SCREEN
ABO/RH(D): A POS
Antibody Screen: NEGATIVE

## 2013-03-22 LAB — PROTIME-INR
INR: 0.95 (ref 0.00–1.49)
Prothrombin Time: 12.5 seconds (ref 11.6–15.2)

## 2013-03-23 ENCOUNTER — Other Ambulatory Visit: Payer: Self-pay | Admitting: Physician Assistant

## 2013-03-23 ENCOUNTER — Encounter: Payer: Self-pay | Admitting: Physician Assistant

## 2013-03-23 DIAGNOSIS — R0681 Apnea, not elsewhere classified: Secondary | ICD-10-CM | POA: Insufficient documentation

## 2013-03-23 DIAGNOSIS — G4730 Sleep apnea, unspecified: Secondary | ICD-10-CM

## 2013-03-23 DIAGNOSIS — M1712 Unilateral primary osteoarthritis, left knee: Secondary | ICD-10-CM | POA: Insufficient documentation

## 2013-03-23 NOTE — H&P (Signed)
TOTAL KNEE ADMISSION H&P  Patient is being admitted for left total knee arthroplasty.  Subjective:  Chief Complaint:left knee pain.  HPI: [name], 62 y.o. female, has a history of pain and functional disability in the left knee due to arthritis and has failed non-surgical conservative treatments for greater than 12 weeks to includeNSAID's and/or analgesics, corticosteriod injections and flexibility and strengthening excercises.  Onset of symptoms was gradual, starting 10 years ago with gradually worsening course since that time. The patient noted prior procedures on the knee to include  arthroscopy on the left knee(s).  Patient currently rates pain in the left knee(s) at 5 out of 10 with activity. Patient has night pain, worsening of pain with activity and weight bearing, pain that interferes with activities of daily living, pain with passive range of motion and crepitus. There is no active infection.  Patient Active Problem List   Diagnosis Date Noted  . Unspecified sleep apnea 03/23/2013  . 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 Weakness 11/28/2011  . Hypertension 11/28/2011  . Hypokalemia 11/28/2011  . LUE weakness 11/28/2011  . Hypothyroidism 11/28/2011  . Breast cancer 06/19/2011   Past Medical History  Diagnosis Date  . Joint pain   . Joint swelling   . Bruises easily   . Arthritis   . Hypothyroidism     takes Synthroid daily  . Hypertension     takes Tribenzor daily  . Sleep apnea     has cpap but doesn't use;sleep study done 3-4yrs ago  . History of bronchitis     when she was young  . Pneumonia     hx of when she was young  . Cancer 2004    rt breast;takes Arimidex daily;thyroid cancer  . Urinary frequency     Past Surgical History  Procedure Laterality Date  . Breast lumpectomy  2004  . Portacath placement  2004  . Appendectomy  2007  . Knee arthroscopy  2007  . Anterior cervical decomp/discectomy fusion  2008  . Porta cath remove  2005  . Knee arthroscopy   05/02/2011    Procedure: ARTHROSCOPY KNEE;  Surgeon: Jeffrey C Beane;  Location: [facility];  Service: Orthopedics;  Laterality: Right;  . Meniscus debridement  05/02/2011    Procedure: DEBRIDEMENT OF MENISCUS;  Surgeon: Jeffrey C Beane;  Location: [facility];  Service: Orthopedics;  Laterality: Right;  . Portacath removal    . Colonoscopy    . Cesarean section  32yrs ago  . Colonoscopy    . Total knee arthroplasty  04/14/2012    Procedure: TOTAL KNEE ARTHROPLASTY;  Surgeon: Daniel F Murphy, MD;  Location: [facility] OR;  Service: Orthopedics;  Laterality: Right;  RIGHT ARTHROPLASTY KNEE MEDIAL/LATERAL COMPARTMENTS WITH PATELLA RESURFACING  . Thyroid lobectomy Right 09/22/2012    Dr Rosen  . Thyroidectomy N/A 09/22/2012    Procedure: RIGHT THYROID LOBECTOMY WITH FROZEN SECTION;  Surgeon: Jefry Rosen, MD;  Location: [facility] OR;  Service: ENT;  Laterality: N/A;  . Thyroidectomy Left 10/21/2012    Procedure: COMPLETION OF THYROIDECTOMY;  Surgeon: Jefry Rosen, MD;  Location: [facility] OR;  Service: ENT;  Laterality: Left;     (Not in a hospital admission) Allergies  Allergen Reactions  . Penicillins Other (See Comments)    Childhood reaction    History  Substance Use Topics  . Smoking status: Never Smoker   . Smokeless tobacco: Never Used  . Alcohol Use: 0.0 oz/week     Comment: socially      Family History  Problem Relation Age of Onset  . Colon cancer Neg Hx      Review of Systems  Constitutional: Negative.   HENT: Negative.   Eyes: Negative.   Respiratory: Negative.   Cardiovascular: Negative.   Gastrointestinal: Negative.   Genitourinary: Negative.   Musculoskeletal: Positive for joint pain.  Skin: Negative.   Neurological: Negative.   Endo/Heme/Allergies: Negative.   Psychiatric/Behavioral: Negative.     Objective:  Physical Exam  Constitutional: She is oriented to person, place, and time. She appears well-developed and well-nourished.  HENT:  Head:  Normocephalic and atraumatic.  Eyes: EOM are normal. Pupils are equal, round, and reactive to light.  Neck: Neck supple.  Cardiovascular: Normal rate, regular rhythm and normal heart sounds.  Exam reveals no gallop.   No murmur heard. Respiratory: No respiratory distress. She has no wheezes. She has no rales.  Musculoskeletal:  Examination of her left knee reveals good stability and decent range of motion with about 0 degrees of extension to about 120 degrees of flexion.  Patellofemoral crepitus.  She is neurovascularly intact bilateral lower extremities.    Neurological: She is alert and oriented to person, place, and time.  Skin: Skin is warm and dry.  Psychiatric: She has a normal mood and affect.    Vital signs in last 24 hours: @VSRANGES@  Labs:   Estimated body mass index is 33.08 kg/(m^2) as calculated from the following:   Height as of 03/22/13: 5' 4" (1.626 m).   Weight as of 03/22/13: 87.454 kg (192 lb 12.8 oz).   Imaging Review Plain radiographs demonstrate severe degenerative joint disease of the left knee(s).  Assessment/Plan:  End stage arthritis, left knee   The patient history, physical examination, clinical judgment of the provider and imaging studies are consistent with end stage degenerative joint disease of the left knee(s) and total knee arthroplasty is deemed medically necessary. The treatment options including medical management, injection therapy arthroscopy and arthroplasty were discussed at length. The risks and benefits of total knee arthroplasty were presented and reviewed. The risks due to aseptic loosening, infection, stiffness, patella tracking problems, thromboembolic complications and other imponderables were discussed. The patient acknowledged the explanation, agreed to proceed with the plan and consent was signed. Patient is being admitted for inpatient treatment for surgery, pain control, PT, OT, prophylactic antibiotics, VTE prophylaxis, progressive  ambulation and ADL's and discharge planning. The patient is planning to be discharged to skilled nursing facility   

## 2013-03-29 MED ORDER — CLINDAMYCIN PHOSPHATE 900 MG/50ML IV SOLN
900.0000 mg | INTRAVENOUS | Status: AC
  Administered 2013-03-30: 900 mg via INTRAVENOUS
  Filled 2013-03-29 (×2): qty 50

## 2013-03-29 MED ORDER — ACETAMINOPHEN 500 MG PO TABS
1000.0000 mg | ORAL_TABLET | Freq: Once | ORAL | Status: AC
  Administered 2013-03-30: 1000 mg via ORAL

## 2013-03-29 MED ORDER — CHLORHEXIDINE GLUCONATE 4 % EX LIQD: 60.0000 mL | Freq: Once | CUTANEOUS | Status: DC

## 2013-03-30 ENCOUNTER — Encounter (HOSPITAL_COMMUNITY): Payer: Self-pay | Admitting: *Deleted

## 2013-03-30 ENCOUNTER — Inpatient Hospital Stay (HOSPITAL_COMMUNITY)
Admission: RE | Admit: 2013-03-30 | Discharge: 2013-04-04 | DRG: 470 | Disposition: A | Discharge status: Skilled Nursing Facility | Payer: BC Managed Care – PPO | Source: Ambulatory Visit | Attending: Orthopedic Surgery | Admitting: Orthopedic Surgery

## 2013-03-30 ENCOUNTER — Inpatient Hospital Stay (HOSPITAL_COMMUNITY): Payer: BC Managed Care – PPO

## 2013-03-30 ENCOUNTER — Encounter (HOSPITAL_COMMUNITY): Payer: BC Managed Care – PPO | Admitting: Anesthesiology

## 2013-03-30 ENCOUNTER — Encounter (HOSPITAL_COMMUNITY)
Admission: RE | Disposition: A | Discharge status: Skilled Nursing Facility | Payer: Self-pay | Source: Ambulatory Visit | Attending: Orthopedic Surgery

## 2013-03-30 ENCOUNTER — Inpatient Hospital Stay (HOSPITAL_COMMUNITY): Payer: BC Managed Care – PPO | Admitting: Anesthesiology

## 2013-03-30 DIAGNOSIS — M1710 Unilateral primary osteoarthritis, unspecified knee: Principal | ICD-10-CM | POA: Diagnosis present

## 2013-03-30 DIAGNOSIS — Z880 Allergy status to penicillin: Secondary | ICD-10-CM

## 2013-03-30 DIAGNOSIS — E039 Hypothyroidism, unspecified: Secondary | ICD-10-CM | POA: Diagnosis present

## 2013-03-30 DIAGNOSIS — G4730 Sleep apnea, unspecified: Secondary | ICD-10-CM | POA: Diagnosis present

## 2013-03-30 DIAGNOSIS — Z7982 Long term (current) use of aspirin: Secondary | ICD-10-CM

## 2013-03-30 DIAGNOSIS — Z85850 Personal history of malignant neoplasm of thyroid: Secondary | ICD-10-CM

## 2013-03-30 DIAGNOSIS — Z79899 Other long term (current) drug therapy: Secondary | ICD-10-CM

## 2013-03-30 DIAGNOSIS — I10 Essential (primary) hypertension: Secondary | ICD-10-CM | POA: Diagnosis present

## 2013-03-30 DIAGNOSIS — Z96659 Presence of unspecified artificial knee joint: Secondary | ICD-10-CM

## 2013-03-30 DIAGNOSIS — Z9089 Acquired absence of other organs: Secondary | ICD-10-CM

## 2013-03-30 DIAGNOSIS — M179 Osteoarthritis of knee, unspecified: Secondary | ICD-10-CM | POA: Diagnosis present

## 2013-03-30 HISTORY — PX: TOTAL KNEE ARTHROPLASTY: SHX125

## 2013-03-30 SURGERY — ARTHROPLASTY, KNEE, TOTAL
Anesthesia: General | Site: Knee | Laterality: Left

## 2013-03-30 MED ORDER — MIDAZOLAM HCL 5 MG/5ML IJ SOLN
INTRAMUSCULAR | Status: DC | PRN
  Administered 2013-03-30: 2 mg via INTRAVENOUS

## 2013-03-30 MED ORDER — MORPHINE SULFATE 2 MG/ML IJ SOLN
1.0000 mg | INTRAMUSCULAR | Status: DC | PRN
  Administered 2013-03-30: 1 mg via INTRAVENOUS

## 2013-03-30 MED ORDER — ONDANSETRON HCL 4 MG/2ML IJ SOLN
INTRAMUSCULAR | Status: DC | PRN
  Administered 2013-03-30: 4 mg via INTRAVENOUS

## 2013-03-30 MED ORDER — TRIAMCINOLONE ACETONIDE 0.1 % EX CREA
1.0000 "application " | TOPICAL_CREAM | Freq: Two times a day (BID) | CUTANEOUS | Status: DC
  Administered 2013-03-30 – 2013-04-04 (×10): 1 via TOPICAL
  Filled 2013-03-30 (×2): qty 15

## 2013-03-30 MED ORDER — SODIUM CHLORIDE 0.9 % IR SOLN
Status: DC | PRN
  Administered 2013-03-30: 3000 mL

## 2013-03-30 MED ORDER — LACTATED RINGERS IV SOLN
INTRAVENOUS | Status: DC
  Administered 2013-03-30: 10:00:00 via INTRAVENOUS

## 2013-03-30 MED ORDER — ONDANSETRON HCL 4 MG PO TABS: 4.0000 mg | ORAL_TABLET | Freq: Four times a day (QID) | ORAL | Status: DC | PRN

## 2013-03-30 MED ORDER — MENTHOL 3 MG MT LOZG: 1.0000 | LOZENGE | OROMUCOSAL | Status: DC | PRN

## 2013-03-30 MED ORDER — OXYCODONE HCL 5 MG/5ML PO SOLN: 5.0000 mg | Freq: Once | ORAL | Status: DC | PRN

## 2013-03-30 MED ORDER — VANCOMYCIN HCL IN DEXTROSE 1-5 GM/200ML-% IV SOLN
1000.0000 mg | Freq: Two times a day (BID) | INTRAVENOUS | Status: AC
  Administered 2013-03-31: 1000 mg via INTRAVENOUS
  Filled 2013-03-30: qty 200

## 2013-03-30 MED ORDER — PROPOFOL 10 MG/ML IV BOLUS
INTRAVENOUS | Status: DC | PRN
  Administered 2013-03-30: 200 mg via INTRAVENOUS

## 2013-03-30 MED ORDER — MORPHINE SULFATE 2 MG/ML IJ SOLN
INTRAMUSCULAR | Status: AC
  Filled 2013-03-30: qty 1

## 2013-03-30 MED ORDER — DOCUSATE SODIUM 100 MG PO CAPS
100.0000 mg | ORAL_CAPSULE | Freq: Two times a day (BID) | ORAL | Status: DC
  Administered 2013-03-30 – 2013-04-04 (×10): 100 mg via ORAL
  Filled 2013-03-30 (×12): qty 1

## 2013-03-30 MED ORDER — BUPIVACAINE LIPOSOME 1.3 % IJ SUSP
20.0000 mL | INTRAMUSCULAR | Status: DC
  Filled 2013-03-30: qty 20

## 2013-03-30 MED ORDER — SODIUM CHLORIDE 0.9 % IJ SOLN
INTRAMUSCULAR | Status: DC | PRN
  Administered 2013-03-30: 14:00:00

## 2013-03-30 MED ORDER — METOCLOPRAMIDE HCL 10 MG PO TABS: 5.0000 mg | ORAL_TABLET | Freq: Three times a day (TID) | ORAL | Status: DC | PRN

## 2013-03-30 MED ORDER — LACTATED RINGERS IV SOLN
INTRAVENOUS | Status: DC | PRN
  Administered 2013-03-30 (×2): via INTRAVENOUS

## 2013-03-30 MED ORDER — POTASSIUM CHLORIDE IN NACL 20-0.9 MEQ/L-% IV SOLN
INTRAVENOUS | Status: DC
  Administered 2013-03-30 – 2013-03-31 (×2): via INTRAVENOUS
  Filled 2013-03-30 (×14): qty 1000

## 2013-03-30 MED ORDER — DEXAMETHASONE SODIUM PHOSPHATE 10 MG/ML IJ SOLN
10.0000 mg | Freq: Three times a day (TID) | INTRAMUSCULAR | Status: AC
  Administered 2013-03-30: 10 mg via INTRAVENOUS
  Filled 2013-03-30 (×3): qty 1

## 2013-03-30 MED ORDER — OXYCODONE HCL 5 MG PO TABS: 5.0000 mg | ORAL_TABLET | Freq: Once | ORAL | Status: DC | PRN

## 2013-03-30 MED ORDER — HYDROCHLOROTHIAZIDE 25 MG PO TABS
25.0000 mg | ORAL_TABLET | Freq: Every day | ORAL | Status: DC
  Administered 2013-03-31 – 2013-04-04 (×5): 25 mg via ORAL
  Filled 2013-03-30 (×5): qty 1

## 2013-03-30 MED ORDER — ACETAMINOPHEN 500 MG PO TABS
ORAL_TABLET | ORAL | Status: AC
  Filled 2013-03-30: qty 2

## 2013-03-30 MED ORDER — BUPIVACAINE HCL (PF) 0.25 % IJ SOLN
INTRAMUSCULAR | Status: DC | PRN
  Administered 2013-03-30: 10 mL

## 2013-03-30 MED ORDER — HYDROMORPHONE HCL PF 1 MG/ML IJ SOLN: 0.2500 mg | INTRAMUSCULAR | Status: DC | PRN

## 2013-03-30 MED ORDER — PHENOL 1.4 % MT LIQD: 1.0000 | OROMUCOSAL | Status: DC | PRN

## 2013-03-30 MED ORDER — OLMESARTAN-AMLODIPINE-HCTZ 40-10-25 MG PO TABS: 1.0000 | ORAL_TABLET | Freq: Every day | ORAL | Status: DC

## 2013-03-30 MED ORDER — ONDANSETRON HCL 4 MG/2ML IJ SOLN
INTRAMUSCULAR | Status: AC
  Filled 2013-03-30: qty 2

## 2013-03-30 MED ORDER — AMLODIPINE BESYLATE 10 MG PO TABS
10.0000 mg | ORAL_TABLET | Freq: Every day | ORAL | Status: DC
  Administered 2013-03-31 – 2013-04-04 (×5): 10 mg via ORAL
  Filled 2013-03-30 (×5): qty 1

## 2013-03-30 MED ORDER — BUPIVACAINE HCL (PF) 0.25 % IJ SOLN
INTRAMUSCULAR | Status: AC
  Filled 2013-03-30: qty 30

## 2013-03-30 MED ORDER — ONDANSETRON HCL 4 MG/2ML IJ SOLN: 4.0000 mg | Freq: Four times a day (QID) | INTRAMUSCULAR | Status: DC | PRN

## 2013-03-30 MED ORDER — ALUM & MAG HYDROXIDE-SIMETH 200-200-20 MG/5ML PO SUSP: 30.0000 mL | ORAL | Status: DC | PRN

## 2013-03-30 MED ORDER — POTASSIUM CHLORIDE CRYS ER 20 MEQ PO TBCR
20.0000 meq | EXTENDED_RELEASE_TABLET | Freq: Every day | ORAL | Status: DC
  Administered 2013-03-30 – 2013-04-04 (×6): 20 meq via ORAL
  Filled 2013-03-30 (×6): qty 1

## 2013-03-30 MED ORDER — ACETAMINOPHEN 650 MG RE SUPP: 650.0000 mg | Freq: Four times a day (QID) | RECTAL | Status: DC | PRN

## 2013-03-30 MED ORDER — DIPHENHYDRAMINE HCL 12.5 MG/5ML PO ELIX: 12.5000 mg | ORAL_SOLUTION | ORAL | Status: DC | PRN

## 2013-03-30 MED ORDER — LEVOTHYROXINE SODIUM 137 MCG PO TABS
137.0000 ug | ORAL_TABLET | Freq: Every day | ORAL | Status: DC
  Administered 2013-03-31 – 2013-04-04 (×5): 137 ug via ORAL
  Filled 2013-03-30 (×6): qty 1

## 2013-03-30 MED ORDER — ACETAMINOPHEN 325 MG PO TABS
650.0000 mg | ORAL_TABLET | Freq: Four times a day (QID) | ORAL | Status: DC | PRN
  Administered 2013-03-31 – 2013-04-02 (×3): 650 mg via ORAL
  Filled 2013-03-30 (×4): qty 2

## 2013-03-30 MED ORDER — OXYCODONE-ACETAMINOPHEN 5-325 MG PO TABS
1.0000 | ORAL_TABLET | ORAL | Status: DC | PRN
  Administered 2013-04-02 (×2): 1 via ORAL
  Administered 2013-04-03: 2 via ORAL
  Filled 2013-03-30 (×2): qty 1
  Filled 2013-03-30: qty 2

## 2013-03-30 MED ORDER — METOCLOPRAMIDE HCL 5 MG/ML IJ SOLN: 5.0000 mg | Freq: Three times a day (TID) | INTRAMUSCULAR | Status: DC | PRN

## 2013-03-30 MED ORDER — DEXAMETHASONE 6 MG PO TABS
10.0000 mg | ORAL_TABLET | Freq: Three times a day (TID) | ORAL | Status: AC
  Administered 2013-03-31 (×2): 10 mg via ORAL
  Filled 2013-03-30 (×3): qty 1

## 2013-03-30 MED ORDER — ANASTROZOLE 1 MG PO TABS
1.0000 mg | ORAL_TABLET | Freq: Every day | ORAL | Status: DC
  Administered 2013-03-31 – 2013-04-04 (×5): 1 mg via ORAL
  Filled 2013-03-30 (×5): qty 1

## 2013-03-30 MED ORDER — ASPIRIN EC 325 MG PO TBEC
325.0000 mg | DELAYED_RELEASE_TABLET | Freq: Every day | ORAL | Status: DC
  Administered 2013-03-31 – 2013-04-04 (×5): 325 mg via ORAL
  Filled 2013-03-30 (×6): qty 1

## 2013-03-30 MED ORDER — IRBESARTAN 300 MG PO TABS
300.0000 mg | ORAL_TABLET | Freq: Every day | ORAL | Status: DC
  Administered 2013-03-31 – 2013-04-04 (×5): 300 mg via ORAL
  Filled 2013-03-30 (×5): qty 1

## 2013-03-30 MED ORDER — SODIUM CHLORIDE 0.9 % IV SOLN: INTRAVENOUS | Status: DC

## 2013-03-30 MED ORDER — FENTANYL CITRATE 0.05 MG/ML IJ SOLN
INTRAMUSCULAR | Status: DC | PRN
  Administered 2013-03-30 (×7): 50 ug via INTRAVENOUS

## 2013-03-30 MED ORDER — LIDOCAINE HCL (CARDIAC) 20 MG/ML IV SOLN
INTRAVENOUS | Status: DC | PRN
  Administered 2013-03-30: 80 mg via INTRAVENOUS

## 2013-03-30 MED ORDER — ONDANSETRON HCL 4 MG/2ML IJ SOLN
4.0000 mg | Freq: Four times a day (QID) | INTRAMUSCULAR | Status: AC | PRN
  Administered 2013-03-30: 4 mg via INTRAVENOUS

## 2013-03-30 SURGICAL SUPPLY — 65 items
APL SKNCLS STERI-STRIP NONHPOA (GAUZE/BANDAGES/DRESSINGS) ×1
BANDAGE ELASTIC 4 VELCRO ST LF (GAUZE/BANDAGES/DRESSINGS) ×2 IMPLANT
BANDAGE ELASTIC 6 VELCRO ST LF (GAUZE/BANDAGES/DRESSINGS) ×1 IMPLANT
BANDAGE ESMARK 6X9 LF (GAUZE/BANDAGES/DRESSINGS) ×1 IMPLANT
BENZOIN TINCTURE PRP APPL 2/3 (GAUZE/BANDAGES/DRESSINGS) ×2 IMPLANT
BLADE SAG 18X100X1.27 (BLADE) ×4 IMPLANT
BNDG CMPR 9X6 STRL LF SNTH (GAUZE/BANDAGES/DRESSINGS) ×1
BNDG ESMARK 6X9 LF (GAUZE/BANDAGES/DRESSINGS) ×2
BOWL SMART MIX CTS (DISPOSABLE) ×2 IMPLANT
CEMENT BONE SIMPLEX SPEEDSET (Cement) ×4 IMPLANT
CLOTH BEACON ORANGE TIMEOUT ST (SAFETY) ×2 IMPLANT
COVER SURGICAL LIGHT HANDLE (MISCELLANEOUS) ×2 IMPLANT
CUFF TOURNIQUET SINGLE 34IN LL (TOURNIQUET CUFF) ×2 IMPLANT
DRAPE EXTREMITY T 121X128X90 (DRAPE) ×2 IMPLANT
DRAPE PROXIMA HALF (DRAPES) ×2 IMPLANT
DRAPE U-SHAPE 47X51 STRL (DRAPES) ×2 IMPLANT
DRSG PAD ABDOMINAL 8X10 ST (GAUZE/BANDAGES/DRESSINGS) ×2 IMPLANT
DURAPREP 26ML APPLICATOR (WOUND CARE) ×2 IMPLANT
ELECT CAUTERY BLADE 6.4 (BLADE) ×2 IMPLANT
ELECT REM PT RETURN 9FT ADLT (ELECTROSURGICAL) ×2
ELECTRODE REM PT RTRN 9FT ADLT (ELECTROSURGICAL) ×1 IMPLANT
EVACUATOR 1/8 PVC DRAIN (DRAIN) ×2 IMPLANT
FACESHIELD LNG OPTICON STERILE (SAFETY) ×4 IMPLANT
GAUZE XEROFORM 5X9 LF (GAUZE/BANDAGES/DRESSINGS) ×2 IMPLANT
GLOVE BIO SURGEON STRL SZ8 (GLOVE) ×2 IMPLANT
GLOVE ORTHO TXT STRL SZ7.5 (GLOVE) ×2 IMPLANT
GOWN PREVENTION PLUS XLARGE (GOWN DISPOSABLE) ×4 IMPLANT
GOWN PREVENTION PLUS XXLARGE (GOWN DISPOSABLE) ×2 IMPLANT
GOWN STRL NON-REIN LRG LVL3 (GOWN DISPOSABLE) ×4 IMPLANT
HANDPIECE INTERPULSE COAX TIP (DISPOSABLE) ×2
IMMOBILIZER KNEE 22 UNIV (SOFTGOODS) ×2 IMPLANT
IMMOBILIZER KNEE 24 THIGH 36 (MISCELLANEOUS) IMPLANT
IMMOBILIZER KNEE 24 UNIV (MISCELLANEOUS)
KIT BASIN OR (CUSTOM PROCEDURE TRAY) ×2 IMPLANT
KIT ROOM TURNOVER OR (KITS) ×2 IMPLANT
KNEE/VIT E POLY LINER LEVEL 1B ×1 IMPLANT
MANIFOLD NEPTUNE II (INSTRUMENTS) ×2 IMPLANT
NDL 18GX1X1/2 (RX/OR ONLY) (NEEDLE) ×1 IMPLANT
NDL HYPO 25GX1X1/2 BEV (NEEDLE) ×1 IMPLANT
NEEDLE 18GX1X1/2 (RX/OR ONLY) (NEEDLE) ×2 IMPLANT
NEEDLE HYPO 25GX1X1/2 BEV (NEEDLE) ×2 IMPLANT
NS IRRIG 1000ML POUR BTL (IV SOLUTION) ×2 IMPLANT
PACK TOTAL JOINT (CUSTOM PROCEDURE TRAY) ×2 IMPLANT
PAD ARMBOARD 7.5X6 YLW CONV (MISCELLANEOUS) ×4 IMPLANT
PAD CAST 4YDX4 CTTN HI CHSV (CAST SUPPLIES) ×1 IMPLANT
PADDING CAST COTTON 4X4 STRL (CAST SUPPLIES) ×2
PADDING CAST COTTON 6X4 STRL (CAST SUPPLIES) ×2 IMPLANT
SET HNDPC FAN SPRY TIP SCT (DISPOSABLE) ×1 IMPLANT
SPONGE GAUZE 4X4 12PLY (GAUZE/BANDAGES/DRESSINGS) ×2 IMPLANT
STAPLER VISISTAT 35W (STAPLE) ×2 IMPLANT
STRIP CLOSURE SKIN 1/2X4 (GAUZE/BANDAGES/DRESSINGS) ×4 IMPLANT
SUCTION FRAZIER TIP 10 FR DISP (SUCTIONS) ×2 IMPLANT
SUT VIC AB 0 CTX 18 (SUTURE) IMPLANT
SUT VIC AB 1 CTX 36 (SUTURE) ×4
SUT VIC AB 1 CTX36XBRD ANBCTR (SUTURE) ×2 IMPLANT
SUT VIC AB 2-0 CT1 27 (SUTURE) ×4
SUT VIC AB 2-0 CT1 TAPERPNT 27 (SUTURE) ×2 IMPLANT
SUT VIC AB 3-0 PS1 18 (SUTURE) ×2
SUT VIC AB 3-0 PS1 18XBRD (SUTURE) ×1 IMPLANT
SUT VLOC 180 0 24IN GS25 (SUTURE) ×2 IMPLANT
SYR CONTROL 10ML LL (SYRINGE) ×2 IMPLANT
TOWEL OR 17X24 6PK STRL BLUE (TOWEL DISPOSABLE) ×2 IMPLANT
TOWEL OR 17X26 10 PK STRL BLUE (TOWEL DISPOSABLE) ×2 IMPLANT
TRAY FOLEY CATH 16FRSI W/METER (SET/KITS/TRAYS/PACK) ×2 IMPLANT
WATER STERILE IRR 1000ML POUR (IV SOLUTION) ×4 IMPLANT

## 2013-03-30 NOTE — Anesthesia Preprocedure Evaluation (Signed)
Anesthesia Evaluation  Patient identified by MRN, date of birth, ID band Patient awake    Reviewed: Allergy & Precautions, H&P , NPO status , Patient's Chart, lab work & pertinent test results  Airway Mallampati: II  Neck ROM: full    Dental   Pulmonary sleep apnea ,          Cardiovascular hypertension,     Neuro/Psych    GI/Hepatic   Endo/Other  Hypothyroidism obese  Renal/GU      Musculoskeletal  (+) Arthritis -,   Abdominal   Peds  Hematology   Anesthesia Other Findings   Reproductive/Obstetrics H/o breast CA                           Anesthesia Physical Anesthesia Plan  ASA: II  Anesthesia Plan: General   Post-op Pain Management:    Induction: Intravenous  Airway Management Planned: LMA  Additional Equipment:   Intra-op Plan:   Post-operative Plan:   Informed Consent: I have reviewed the patients History and Physical, chart, labs and discussed the procedure including the risks, benefits and alternatives for the proposed anesthesia with the patient or authorized representative who has indicated his/her understanding and acceptance.     Plan Discussed with: CRNA, Anesthesiologist and Surgeon  Anesthesia Plan Comments:         Anesthesia Quick Evaluation

## 2013-03-30 NOTE — Progress Notes (Signed)
Orthopedic Tech Progress Note Patient Details:  [name] 1950-12-05 [id number]  CPM Left Knee CPM Left Knee: On Left Knee Flexion (Degrees): 60 Left Knee Extension (Degrees): 0 Additional Comments: Trapeze bar , foot roll   Shawnie Pons 03/30/2013, 4:31 PM

## 2013-03-30 NOTE — Transfer of Care (Signed)
Immediate Anesthesia Transfer of Care Note  Patient: [name]  Procedure(s) Performed: Procedure(s): TOTAL KNEE ARTHROPLASTY (Left)  Patient Location: PACU  Anesthesia Type:General  Level of Consciousness: awake, alert  and oriented  Airway & Oxygen Therapy: Patient Spontanous Breathing and Patient connected to nasal cannula oxygen  Post-op Assessment: Report given to PACU RN, Post -op Vital signs reviewed and stable and Patient moving all extremities  Post vital signs: Reviewed and stable  Complications: No apparent anesthesia complications

## 2013-03-30 NOTE — Interval H&P Note (Signed)
History and Physical Interval Note:  03/30/2013 8:25 AM  [name]  has presented today for surgery, with the diagnosis of DJD LEFT KNEE  The various methods of treatment have been discussed with the patient and family. After consideration of risks, benefits and other options for treatment, the patient has consented to  Procedure(s): TOTAL KNEE ARTHROPLASTY (Left) as a surgical intervention .  The patient's history has been reviewed, patient examined, no change in status, stable for surgery.  I have reviewed the patient's chart and labs.  Questions were answered to the patient's satisfaction.     Peregrine Nolt F

## 2013-03-30 NOTE — H&P (View-Only) (Signed)
TOTAL KNEE ADMISSION H&P  Patient is being admitted for left total knee arthroplasty.  Subjective:  Chief Complaint:left knee pain.  HPI: [name], 62 y.o. female, has a history of pain and functional disability in the left knee due to arthritis and has failed non-surgical conservative treatments for greater than 12 weeks to includeNSAID's and/or analgesics, corticosteriod injections and flexibility and strengthening excercises.  Onset of symptoms was gradual, starting 10 years ago with gradually worsening course since that time. The patient noted prior procedures on the knee to include  arthroscopy on the left knee(s).  Patient currently rates pain in the left knee(s) at 5 out of 10 with activity. Patient has night pain, worsening of pain with activity and weight bearing, pain that interferes with activities of daily living, pain with passive range of motion and crepitus. There is no active infection.  Patient Active Problem List   Diagnosis Date Noted  . Unspecified sleep apnea 03/23/2013  . 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 Weakness 11/28/2011  . Hypertension 11/28/2011  . Hypokalemia 11/28/2011  . LUE weakness 11/28/2011  . Hypothyroidism 11/28/2011  . Breast cancer 06/19/2011   Past Medical History  Diagnosis Date  . Joint pain   . Joint swelling   . Bruises easily   . Arthritis   . Hypothyroidism     takes Synthroid daily  . Hypertension     takes Tribenzor daily  . Sleep apnea     has cpap but doesn't use;sleep study done 3-16yrs ago  . History of bronchitis     when she was young  . Pneumonia     hx of when she was young  . Cancer 2004    rt breast;takes Arimidex daily;thyroid cancer  . Urinary frequency     Past Surgical History  Procedure Laterality Date  . Breast lumpectomy  2004  . Portacath placement  2004  . Appendectomy  2007  . Knee arthroscopy  2007  . Anterior cervical decomp/discectomy fusion  2008  . Porta cath remove  2005  . Knee arthroscopy   05/02/2011    Procedure: ARTHROSCOPY KNEE;  Surgeon: Javier Docker;  Location: [facility];  Service: Orthopedics;  Laterality: Right;  . Meniscus debridement  05/02/2011    Procedure: DEBRIDEMENT OF MENISCUS;  Surgeon: Javier Docker;  Location: [facility];  Service: Orthopedics;  Laterality: Right;  . Portacath removal    . Colonoscopy    . Cesarean section  40yrs ago  . Colonoscopy    . Total knee arthroplasty  04/14/2012    Procedure: TOTAL KNEE ARTHROPLASTY;  Surgeon: Loreta Ave, MD;  Location: [facility] OR;  Service: Orthopedics;  Laterality: Right;  RIGHT ARTHROPLASTY KNEE MEDIAL/LATERAL COMPARTMENTS WITH PATELLA RESURFACING  . Thyroid lobectomy Right 09/22/2012    Dr Pollyann Kennedy  . Thyroidectomy N/A 09/22/2012    Procedure: RIGHT THYROID LOBECTOMY WITH FROZEN SECTION;  Surgeon: Serena Colonel, MD;  Location: [facility] OR;  Service: ENT;  Laterality: N/A;  . Thyroidectomy Left 10/21/2012    Procedure: COMPLETION OF THYROIDECTOMY;  Surgeon: Serena Colonel, MD;  Location: [facility] OR;  Service: ENT;  Laterality: Left;     (Not in a hospital admission) Allergies  Allergen Reactions  . Penicillins Other (See Comments)    Childhood reaction    History  Substance Use Topics  . Smoking status: Never Smoker   . Smokeless tobacco: Never Used  . Alcohol Use: 0.0 oz/week     Comment: socially  Family History  Problem Relation Age of Onset  . Colon cancer Neg Hx      Review of Systems  Constitutional: Negative.   HENT: Negative.   Eyes: Negative.   Respiratory: Negative.   Cardiovascular: Negative.   Gastrointestinal: Negative.   Genitourinary: Negative.   Musculoskeletal: Positive for joint pain.  Skin: Negative.   Neurological: Negative.   Endo/Heme/Allergies: Negative.   Psychiatric/Behavioral: Negative.     Objective:  Physical Exam  Constitutional: She is oriented to person, place, and time. She appears well-developed and well-nourished.  HENT:  Head:  Normocephalic and atraumatic.  Eyes: EOM are normal. Pupils are equal, round, and reactive to light.  Neck: Neck supple.  Cardiovascular: Normal rate, regular rhythm and normal heart sounds.  Exam reveals no gallop.   No murmur heard. Respiratory: No respiratory distress. She has no wheezes. She has no rales.  Musculoskeletal:  Examination of her left knee reveals good stability and decent range of motion with about 0 degrees of extension to about 120 degrees of flexion.  Patellofemoral crepitus.  She is neurovascularly intact bilateral lower extremities.    Neurological: She is alert and oriented to person, place, and time.  Skin: Skin is warm and dry.  Psychiatric: She has a normal mood and affect.    Vital signs in last 24 hours: @VSRANGES @  Labs:   Estimated body mass index is 33.08 kg/(m^2) as calculated from the following:   Height as of 03/22/13: 5\' 4"  (1.626 m).   Weight as of 03/22/13: 87.454 kg (192 lb 12.8 oz).   Imaging Review Plain radiographs demonstrate severe degenerative joint disease of the left knee(s).  Assessment/Plan:  End stage arthritis, left knee   The patient history, physical examination, clinical judgment of the provider and imaging studies are consistent with end stage degenerative joint disease of the left knee(s) and total knee arthroplasty is deemed medically necessary. The treatment options including medical management, injection therapy arthroscopy and arthroplasty were discussed at length. The risks and benefits of total knee arthroplasty were presented and reviewed. The risks due to aseptic loosening, infection, stiffness, patella tracking problems, thromboembolic complications and other imponderables were discussed. The patient acknowledged the explanation, agreed to proceed with the plan and consent was signed. Patient is being admitted for inpatient treatment for surgery, pain control, PT, OT, prophylactic antibiotics, VTE prophylaxis, progressive  ambulation and ADL's and discharge planning. The patient is planning to be discharged to skilled nursing facility

## 2013-03-30 NOTE — Preoperative (Signed)
Beta Blockers   Reason not to administer Beta Blockers:Not Applicable 

## 2013-03-31 LAB — BASIC METABOLIC PANEL
BUN: 14 mg/dL (ref 6–23)
Creatinine, Ser: 0.79 mg/dL (ref 0.50–1.10)
GFR calc Af Amer: >90 mL/min (ref >90)
GFR calc non Af Amer: 88 mL/min — ABNORMAL LOW (ref >90)
Potassium: 3.5 mEq/L (ref 3.5–5.1)
Sodium: 140 mEq/L (ref 135–145)

## 2013-03-31 LAB — CBC
HCT: 30.6 % — ABNORMAL LOW (ref 36.0–46.0)
MCHC: 33.7 g/dL (ref 30.0–36.0)
MCV: 85.7 fL (ref 78.0–100.0)
RDW: 12.2 % (ref 11.5–15.5)

## 2013-03-31 NOTE — Progress Notes (Signed)
UR review completed. 

## 2013-03-31 NOTE — Evaluation (Signed)
Occupational Therapy Evaluation Patient Details Name: [name] MRN: [id number] DOB: 07-Aug-1950 Today's Date: 03/31/2013 Time: 1433-1500 OT Time Calculation (min): 27 min  OT Assessment / Plan / Recommendation History of present illness Pt is s/p L TKA (03/30/13) due to arthritis.   Clinical Impression   This 62 yo female admitted for above presents to acute OT with decreased AROM LLE, increased pain LLE, and lives alone is not currently at a level that she can manage her BADLs by herself, but can get there after a short stay at SNF with OT.    OT Assessment  Patient needs continued OT Services    Follow Up Recommendations  SNF    Barriers to Discharge Decreased caregiver support    Equipment Recommendations   (TBD at next venue)       Frequency  Min 2X/week    Precautions / Restrictions Precautions Precautions: Knee;Fall Required Braces or Orthoses: Knee Immobilizer - Left Knee Immobilizer - Left: Discontinue post op day 2 Restrictions LLE Weight Bearing: Weight bearing as tolerated       ADL  Eating/Feeding: Independent Where Assessed - Eating/Feeding: Edge of bed Grooming: Set up Where Assessed - Grooming: Unsupported sitting Upper Body Bathing: Set up Where Assessed - Upper Body Bathing: Unsupported sitting Lower Body Bathing: Moderate assistance Where Assessed - Lower Body Bathing: Supported sit to stand Upper Body Dressing: Set up Where Assessed - Upper Body Dressing: Unsupported sitting Lower Body Dressing: Moderate assistance Where Assessed - Lower Body Dressing: Supported sit to Pharmacist, hospital: Minimal assistance Statistician Method: Sit to Barista: Comfort height toilet;Grab bars Toileting - Architect and Hygiene: Min guard Where Assessed - Engineer, mining and Hygiene: Standing Equipment Used: Gait belt;Rolling walker;Knee Immobilizer Transfers/Ambulation Related to ADLs: Min A for all  with RW and KI    OT Diagnosis: Generalized weakness;Acute pain  OT Problem List: Decreased strength;Decreased range of motion;Pain;Decreased knowledge of use of DME or AE OT Treatment Interventions: Self-care/ADL training;Balance training;DME and/or AE instruction;Patient/family education   OT Goals(Current goals can be found in the care plan section) Acute Rehab OT Goals Patient Stated Goal: To go back to [facility] for ~1 week where she had her last post op knee rehab OT Goal Formulation: With patient Time For Goal Achievement: 04/07/13  Visit Information  Last OT Received On: 03/31/13 Assistance Needed: +1 History of Present Illness: Pt is s/p L TKA (03/30/13) due to arthritis.       Prior Functioning     Home Living Family/patient expects to be discharged to:: Skilled nursing facility Living Arrangements: Alone Prior Function Level of Independence: Independent Communication Communication: No difficulties Dominant Hand: Right         Vision/Perception Vision - History Patient Visual Report: No change from baseline   Cognition  Cognition Arousal/Alertness: Awake/alert Behavior During Therapy: WFL for tasks assessed/performed Overall Cognitive Status: Within Functional Limits for tasks assessed    Extremity/Trunk Assessment Upper Extremity Assessment Upper Extremity Assessment: Overall WFL for tasks assessed     Mobility Bed Mobility Bed Mobility: Supine to Sit;Sitting - Scoot to Edge of Bed Supine to Sit: With rails;4: Min guard;HOB elevated Sitting - Scoot to Edge of Bed: 4: Min guard;With rail Transfers Transfers: Sit to Stand;Stand to Sit Sit to Stand: 4: Min assist;With upper extremity assist;From bed Stand to Sit: 4: Min assist;With upper extremity assist;With armrests;To chair/3-in-1 Details for Transfer Assistance: cueing for proper hand placement and safety  End of Session OT - End of Session Equipment Utilized During Treatment: Gait  belt;Left knee immobilizer Activity Tolerance: Patient tolerated treatment well Patient left: in chair;with call bell/phone within reach CPM Left Knee CPM Left Knee: Off Left Knee Flexion (Degrees): 60 Left Knee Extension (Degrees): 0       [name] [phone] 03/31/2013, 3:27 PM

## 2013-03-31 NOTE — Progress Notes (Signed)
Subjective: 1 Day Post-Op Procedure(s) (LRB): TOTAL KNEE ARTHROPLASTY (Left) Patient reports pain as 4 on 0-10 scale.  She is doing well.  Denies nausea or vomiting at this time.  She has been urinating and has passed gas.  No BM as of yet.  Tolerating diet well.  Objective: Vital signs in last 24 hours: Temp:  [96.8 F (36 C)-98.9 F (37.2 C)] 98.5 F (36.9 C) (12/04 0509) Pulse Rate:  [73-119] 92 (12/04 0509) Resp:  [9-22] 18 (12/04 0509) BP: (98-147)/(61-84) 144/83 mmHg (12/04 0509) SpO2:  [93 %-100 %] 97 % (12/04 0509)  Intake/Output from previous day: 12/03 0701 - 12/04 0700 In: 2590 [P.O.:240; I.V.:2300] Out: 250 [Drains:200; Blood:50] Intake/Output this shift:     Recent Labs  03/31/13 0411  HGB 10.3*    Recent Labs  03/31/13 0411  WBC 7.7  RBC 3.57*  HCT 30.6*  PLT 225    Recent Labs  03/31/13 0411  NA 140  K 3.5  CL 105  CO2 23  BUN 14  CREATININE 0.79  GLUCOSE 186*  CALCIUM 8.7   No results found for this basename: LABPT, INR,  in the last 72 hours  Neurologically intact ABD soft Neurovascular intact Sensation intact distally Intact pulses distally Dorsiflexion/Plantar flexion intact No leakage through dressing. Drain pulled today by me.  No calf pain noted, negative Homen's.    Assessment/Plan: 1 Day Post-Op Procedure(s) (LRB): TOTAL KNEE ARTHROPLASTY (Left) Advance diet Up with therapy D/C IV fluids  ANTON, M. LINDSEY 03/31/2013, 7:07 AM

## 2013-03-31 NOTE — Evaluation (Signed)
Physical Therapy Evaluation Patient Details Name: [name] MRN: [id number] DOB: Sep 13, 1950 Today's Date: 03/31/2013 Time: 0950-1008 PT Time Calculation (min): 18 min  PT Assessment / Plan / Recommendation History of Present Illness  Pt is s/p L TKA (03/30/13) due to arthritis.  Clinical Impression  Pt is s/p TKA resulting in the deficits listed below (see PT Problem List).  Pt will benefit from skilled PT to increase their independence and safety with mobility to allow discharge to the venue listed below.     PT Assessment  Patient needs continued PT services    Follow Up Recommendations  SNF    Does the patient have the potential to tolerate intense rehabilitation      Barriers to Discharge Decreased caregiver support Pt lives alone.    Equipment Recommendations  None recommended by PT    Recommendations for Other Services     Frequency 7X/week    Precautions / Restrictions Precautions Required Braces or Orthoses: Knee Immobilizer - Left Knee Immobilizer - Left: Discontinue post op day 2 Restrictions Weight Bearing Restrictions: Yes LLE Weight Bearing: Weight bearing as tolerated   Pertinent Vitals/Pain No pain      Mobility  Bed Mobility Bed Mobility: Supine to Sit Supine to Sit: HOB elevated;With rails;4: Min guard Transfers Transfers: Sit to Stand Sit to Stand: 4: Min guard Details for Transfer Assistance: cueing for proper hand placement and safety Ambulation/Gait Ambulation/Gait Assistance: 4: Min guard Ambulation Distance (Feet): 50 Feet Assistive device: Rolling walker Ambulation/Gait Assistance Details: Cues for proper sequencing with RW Gait Pattern: Step-to pattern Gait velocity: WNL    Exercises Total Joint Exercises Ankle Circles/Pumps: AROM;10 reps;Both;Supine Quad Sets: 5 reps;Left;Supine Heel Slides: AROM;Left;10 reps   PT Diagnosis: Difficulty walking  PT Problem List: Decreased strength;Decreased range of motion;Decreased  balance;Decreased mobility;Decreased activity tolerance PT Treatment Interventions: DME instruction;Gait training;Functional mobility training;Therapeutic activities;Therapeutic exercise     PT Goals(Current goals can be found in the care plan section) Acute Rehab PT Goals Patient Stated Goal: To go back to [facility] for ~1 week where she had her last post op knee rehab PT Goal Formulation: With patient Time For Goal Achievement: 04/07/13 Potential to Achieve Goals: Good  Visit Information  Last PT Received On: 03/31/13 Assistance Needed: +1 History of Present Illness: Pt is s/p L TKA (03/30/13) due to arthritis.       Prior Functioning  Home Living Family/patient expects to be discharged to:: Skilled nursing facility Prior Function Level of Independence: Independent Communication Communication: No difficulties    Cognition  Cognition Arousal/Alertness: Awake/alert Behavior During Therapy: WFL for tasks assessed/performed Overall Cognitive Status: Within Functional Limits for tasks assessed    Extremity/Trunk Assessment Upper Extremity Assessment Upper Extremity Assessment: Defer to OT evaluation Lower Extremity Assessment Lower Extremity Assessment: LLE deficits/detail LLE Deficits / Details: L knee AROM limited by bulky dressing.  Fair + quad set and knee flex ~ 45 degrees Cervical / Trunk Assessment Cervical / Trunk Assessment: Normal   Balance    End of Session PT - End of Session Equipment Utilized During Treatment: Gait belt;Left knee immobilizer Activity Tolerance: Patient tolerated treatment well Patient left: in chair;with call bell/phone within reach Nurse Communication: Mobility status CPM Left Knee CPM Left Knee: Off  GP     [name] 03/31/2013, 11:09 AM

## 2013-03-31 NOTE — Clinical Social Work Psychosocial (Signed)
Clinical Social Work Department BRIEF PSYCHOSOCIAL ASSESSMENT 03/31/2013  Patient:  [name], [name]     Account Number:  [id number]     Admit date:  03/30/2013  Clinical Social Worker:  Read Drivers  Date/Time:  03/31/2013 02:18 PM  Referred by:  Care Management  Date Referred:  03/31/2013 Referred for  SNF Placement   Other Referral:   none   Interview type:  Patient Other interview type:   none    PSYCHOSOCIAL DATA Living Status:  ALONE Admitted from facility:   Level of care:   Primary support name:  Elnita Maxwell Primary support relationship to patient:  SIBLING Degree of support available:   strong    CURRENT CONCERNS Current Concerns  Post-Acute Placement   Other Concerns:   none    SOCIAL WORK ASSESSMENT / PLAN CSW assessed pt at bedside.  Pt was alert and oriented x4 and pleasant.  Pt has out of [place] BCBS PPO as insurance and a rate in the process of being negotiated with [employer].  CSW spoke with Jasmine December at [facility] to confirm this. Jasmine December is hoping to have a rate negotiated tomorrow.  Pt is aware.  CSW will continue to assist with disposition.   Assessment/plan status:   Other assessment/ plan:   none   Information/referral to community resources:   SNF    PATIENT'S/FAMILY'S RESPONSE TO PLAN OF CARE: pt was very appreciative of CSW support and assistance.       Vickii Penna, LCSWA [phone]  Clinical Social Work

## 2013-04-01 ENCOUNTER — Encounter (HOSPITAL_COMMUNITY): Payer: Self-pay | Admitting: Orthopedic Surgery

## 2013-04-01 LAB — BASIC METABOLIC PANEL
Chloride: 106 mEq/L (ref 96–112)
GFR calc Af Amer: >90 mL/min (ref >90)
GFR calc non Af Amer: 88 mL/min — ABNORMAL LOW (ref >90)
Potassium: 3.3 mEq/L — ABNORMAL LOW (ref 3.5–5.1)
Sodium: 142 mEq/L (ref 135–145)

## 2013-04-01 LAB — CBC
MCH: 28.7 pg (ref 26.0–34.0)
Platelets: 218 10*3/uL (ref 150–400)
RBC: 3.41 MIL/uL — ABNORMAL LOW (ref 3.87–5.11)
RDW: 12.6 % (ref 11.5–15.5)
WBC: 16 10*3/uL — ABNORMAL HIGH (ref 4.0–10.5)

## 2013-04-01 MED ORDER — ASPIRIN 325 MG PO TBEC: 325.0000 mg | DELAYED_RELEASE_TABLET | Freq: Every day | ORAL | Status: DC

## 2013-04-01 MED ORDER — OXYCODONE-ACETAMINOPHEN 5-325 MG PO TABS: 1.0000 | ORAL_TABLET | ORAL | Status: DC | PRN

## 2013-04-01 NOTE — Op Note (Signed)
[name], [name] NO.:  [id number]  MEDICAL RECORD NO.:  [id number]  LOCATION:  [id number]                        FACILITY:  [facility]  PHYSICIAN:  Loreta Ave, M.D. DATE OF BIRTH:  04-Dec-1950  DATE OF PROCEDURE:  03/30/2013 DATE OF DISCHARGE:                              OPERATIVE REPORT   PREOPERATIVE DIAGNOSIS:  Left knee end-stage degenerative arthritis.  POSTOPERATIVE DIAGNOSIS:  Left knee end-stage degenerative arthritis.  PROCEDURE:  Modified minimally invasive left total knee replacement, Stryker Triathlon prosthesis.  Cemented pegged posterior stabilized #3 femoral component.  Cemented #4 tibial component, 9 mm polyethylene insert.  Cemented resurfacing 32-mm patellar component.  SURGEON:  Loreta Ave, M.D.  ASSISTANT:  Laural Benes. Jannet Mantis., present throughout the entire case, necessary for timely completion of procedure.  ANESTHESIA:  General.  BLOOD LOSS:  Minimal.  SPECIMENS:  None.  COUNTS:  None.  CULTURES:  None.  COMPLICATIONS:  None.  DRESSING:  Soft compressive knee immobilizer.  DRAINS:  Hemovac x1.  DESCRIPTION OF PROCEDURE:  The patient was brought to the operating room, placed on the operating table in supine position.  After adequate anesthesia had been obtained, tourniquet applied, prepped and draped in usual sterile fashion.  Exsanguinated with elevation of Esmarch. Tourniquet inflated to 350 mmHg.  Straight incision above the patella down the tibial tubercle.  Skin and subcutaneous tissue divided.  Medial arthrotomy, vastus splitting, preserving quad tendon.  Knee exposed. Remnants of menisci, cruciate ligaments, periarticular spurs removed. Loose bodies removed.  Distal femur exposed.  Flexible intramedullary guide.  An 8-mm resection 5 degrees of valgus.  Using epicondylar axis, the femur was sized, cut, and fitted for a pegged posterior stabilized #3 component.  Extramedullary guide on the tibia.  A 3-degree  posterior slope cut.  Size #4 component.  Patella exposed.  Posterior 10 mm removed.  Drilled, sized, and fitted for a 32-mm component.  Debris cleared throughout the knee, front and back.  Trials put in place.  With the 9 mm insert, very pleased with biomechanical axis.  Balance in flexion, extension, and patellar tracking.  Tibia was marked for rotation, hand reamed.  All trials had been removed.  Copious irrigation with a pulse irrigating device.  Cement prepared, placed on all components, firmly seated.  Polyethylene attached to tibia, knee reduced.  Patella held with a clamp.  Once cement hardened, the knee was irrigated once again.  Soft tissues injected with Exparel.  Hemovac was placed and brought out through a separate stab wound.  Arthrotomy was closed with #1 Vicryl.  Skin and subcutaneous tissue was closed with subcutaneous subcuticular.  Margins were injected with Marcaine. Sterile compressive dressing applied.  Tourniquet deflated and removed. Knee immobilizer applied.  Anesthesia reversed.  Brought to the recovery room.  Tolerated the surgery well.  No complications.     Loreta Ave, M.D.     DFM/MEDQ  D:  03/31/2013  T:  04/01/2013  Job:  [id number]

## 2013-04-01 NOTE — Discharge Summary (Signed)
Patient ID: [name] MRN: [id number] DOB/AGE: 1950-04-29 62 y.o.  Admit date: 03/30/2013 Discharge date: 04/01/2013  Admission Diagnoses:  Active Problems:   DJD (degenerative joint disease) of knee   Discharge Diagnoses:  Same  Past Medical History  Diagnosis Date  . Joint pain   . Joint swelling   . Bruises easily   . Arthritis   . Hypothyroidism     takes Synthroid daily  . Hypertension     takes Tribenzor daily  . Sleep apnea     has cpap but doesn't use;sleep study done 3-23yrs ago  . History of bronchitis     when she was young  . Pneumonia     hx of when she was young  . Cancer 2004    rt breast;takes Arimidex daily;thyroid cancer  . Urinary frequency     Surgeries: Procedure(s): TOTAL KNEE ARTHROPLASTY on 03/30/2013   Consultants:    Discharged Condition: Improved  Hospital Course: [name] is an 62 y.o. female who was admitted 03/30/2013 for operative treatment of<principal problem not specified>. Patient has severe unremitting pain that affects sleep, daily activities, and work/hobbies. After pre-op clearance the patient was taken to the operating room on 03/30/2013 and underwent  Procedure(s): TOTAL KNEE ARTHROPLASTY.    Patient was given perioperative antibiotics: Anti-infectives   Start     Dose/Rate Route Frequency Ordered Stop   03/30/13 2359  vancomycin (VANCOCIN) IVPB 1000 mg/200 mL premix     1,000 mg 200 mL/hr over 60 Minutes Intravenous Every 12 hours 03/30/13 1850 03/31/13 0144   03/30/13 0600  clindamycin (CLEOCIN) IVPB 900 mg     900 mg 100 mL/hr over 30 Minutes Intravenous On call to O.R. 03/29/13 1359 03/30/13 1238       Patient was given sequential compression devices, early ambulation, and chemoprophylaxis to prevent DVT.  Patient benefited maximally from hospital stay and there were no complications.    Recent vital signs: Patient Vitals for the past 24 hrs:  BP Temp Temp src Pulse Resp SpO2  04/01/13 0522 137/72 mmHg  98.2 F (36.8 C) - 95 18 100 %  03/31/13 2103 147/73 mmHg 98.4 F (36.9 C) - 110 18 99 %  03/31/13 1243 139/66 mmHg 98.4 F (36.9 C) Oral 106 16 96 %     Recent laboratory studies:  Recent Labs  03/31/13 0411 04/01/13 0600  WBC 7.7 16.0*  HGB 10.3* 9.8*  HCT 30.6* 29.2*  PLT 225 218  NA 140 142  K 3.5 3.3*  CL 105 106  CO2 23 27  BUN 14 13  CREATININE 0.79 0.78  GLUCOSE 186* 131*  CALCIUM 8.7 8.5     Discharge Medications:     Medication List    STOP taking these medications       ALEVE PO     HYDROcodone-acetaminophen 7.5-325 MG per tablet  Commonly known as:  NORCO      TAKE these medications       anastrozole 1 MG tablet  Commonly known as:  ARIMIDEX  Take 1 mg by mouth daily.     aspirin 325 MG EC tablet  Take 1 tablet (325 mg total) by mouth daily with breakfast. Take once daily for 4 weeks post-op for prevention of blood clots.     levothyroxine 137 MCG tablet  Commonly known as:  SYNTHROID, LEVOTHROID  Take 137 mcg by mouth daily before breakfast.     multivitamin with minerals Tabs tablet  Take 1 tablet  by mouth daily.     oxyCODONE-acetaminophen 5-325 MG per tablet  Commonly known as:  PERCOCET/ROXICET  Take 1-2 tablets by mouth every 4 (four) hours as needed for moderate pain or severe pain.     potassium chloride 10 MEQ CR capsule  Commonly known as:  MICRO-K  Take 2 capsules (20 mEq total) by mouth daily.     promethazine 25 MG suppository  Commonly known as:  PHENERGAN  Place 1 suppository (25 mg total) rectally every 6 (six) hours as needed for nausea.     triamcinolone cream 0.1 %  Commonly known as:  KENALOG  Apply 1 application topically 2 (two) times daily.     TRIBENZOR 40-10-25 MG Tabs  Generic drug:  Olmesartan-Amlodipine-HCTZ  Take 1 tablet by mouth daily.        Diagnostic Studies: Dg Knee Left Port  03/30/2013   CLINICAL DATA:  Postop  EXAM: PORTABLE LEFT KNEE - 1-2 VIEW  COMPARISON:  None.  FINDINGS: Left total  knee arthroplasty is in place. No breakage or loosening of the hardware. Anatomic alignment of the osseous and prostatic structures. Air and fluid are present in the joint. A surgical drain is noted.  IMPRESSION: Total knee arthroplasty anatomically aligned.   Electronically Signed   By: Maryclare Bean M.D.   On: 03/30/2013 16:40    Disposition: 01-Home or Self Care       Future Appointments Provider Department Dept Phone   09/05/2013 4:00 PM [facility] [phone]   09/12/2013 4:00 PM Lowella Dell, MD [facility] [phone]      Follow-up Information   Follow up with Loreta Ave, MD In 2 weeks.   Specialty:  Orthopedic Surgery   Contact information:   [address] [place] [address] [phone]        Signed: Gearldine Shown 04/01/2013, 8:06 AM

## 2013-04-01 NOTE — Care Management Note (Signed)
Patient preoperatively setup with [facility]. Per physical therapy patient will need SNF for shortterm rehab. Social worker is aware. Vance Peper, RN BSN

## 2013-04-01 NOTE — Progress Notes (Signed)
Subjective: 2 Days Post-Op Procedure(s) (LRB): TOTAL KNEE ARTHROPLASTY (Left) Patient reports pain as 3 on 0-10 scale.  Patient doing very well. No nausea/vomiting.  Passing gas, but not bm as of yet. Tolerating diet well.    Objective: Vital signs in last 24 hours: Temp:  [98.2 F (36.8 C)-98.4 F (36.9 C)] 98.2 F (36.8 C) (12/05 0522) Pulse Rate:  [95-110] 95 (12/05 0522) Resp:  [16-18] 18 (12/05 0522) BP: (137-147)/(66-73) 137/72 mmHg (12/05 0522) SpO2:  [96 %-100 %] 100 % (12/05 0522)  Intake/Output from previous day: 12/04 0701 - 12/05 0700 In: 808.7 [P.O.:120; I.V.:688.7] Out: -  Intake/Output this shift:     Recent Labs  03/31/13 0411 04/01/13 0600  HGB 10.3* 9.8*    Recent Labs  03/31/13 0411 04/01/13 0600  WBC 7.7 16.0*  RBC 3.57* 3.41*  HCT 30.6* 29.2*  PLT 225 218    Recent Labs  03/31/13 0411 04/01/13 0600  NA 140 142  K 3.5 3.3*  CL 105 106  CO2 23 27  BUN 14 13  CREATININE 0.79 0.78  GLUCOSE 186* 131*  CALCIUM 8.7 8.5   No results found for this basename: LABPT, INR,  in the last 72 hours  Neurologically intact ABD soft Neurovascular intact Sensation intact distally Intact pulses distally Dorsiflexion/Plantar flexion intact Incision: dressing C/D/I and scant drainage Compartment soft Dressing changed by me today. Negative Homen's  Assessment/Plan: 2 Days Post-Op Procedure(s) (LRB): TOTAL KNEE ARTHROPLASTY (Left) Advance diet Up with therapy Plan for discharge tomorrow Discharge to SNF  ANTON, M. LINDSEY 04/01/2013, 8:00 AM

## 2013-04-01 NOTE — Progress Notes (Signed)
Orthopedic Tech Progress Note Patient Details:  [name] [birth year] [id number] Off cpm at 8:15 pm  Patient ID: [name], female   DOB: 04-12-1951, 62 y.o.   MRN: [id number]   Jennye Moccasin 04/01/2013, 8:18 PM

## 2013-04-01 NOTE — Progress Notes (Signed)
Physical Therapy Treatment Patient Details Name: MOSELLA KASA MRN: [id number] DOB: 01-30-1951 Today's Date: 04/01/2013 Time: 1027-2536 PT Time Calculation (min): 24 min  PT Assessment / Plan / Recommendation  History of Present Illness Pt is s/p L TKA (03/30/13) due to arthritis.   PT Comments   Pt progressing with mobility + PT goals.  Pt currently at min guard level for sit<>stand transfers & ambulation however cont with plans of d/c to SNF to maximize independence prior to d/c home due to she lives alone.      Follow Up Recommendations  SNF     Does the patient have the potential to tolerate intense rehabilitation     Barriers to Discharge        Equipment Recommendations  None recommended by PT    Recommendations for Other Services    Frequency 7X/week   Progress towards PT Goals Progress towards PT goals: Progressing toward goals  Plan Current plan remains appropriate    Precautions / Restrictions Precautions Precautions: Knee;Fall Required Braces or Orthoses: Knee Immobilizer - Left Knee Immobilizer - Left: Discontinue post op day 2 Restrictions LLE Weight Bearing: Weight bearing as tolerated   Pertinent Vitals/Pain "it's just sore".  Repositioned for comfort.      Mobility  Bed Mobility Bed Mobility: Not assessed Transfers Transfers: Sit to Stand;Stand to Sit Sit to Stand: 4: Min guard;With upper extremity assist;With armrests;From toilet;From chair/3-in-1 Stand to Sit: 4: Min guard;With upper extremity assist;With armrests;To chair/3-in-1;To toilet Details for Transfer Assistance: cues for hand placement Ambulation/Gait Ambulation/Gait Assistance: 4: Min guard Ambulation Distance (Feet): 80 Feet Assistive device: Rolling walker Ambulation/Gait Assistance Details: cues for sequencing, body positioning inside RW, & to look ahead.  Trialed ambulation without KI- no knee buckling noted.   Gait Pattern: Step-to pattern;Decreased weight shift to left;Decreased  step length - right;Antalgic Stairs: No Wheelchair Mobility Wheelchair Mobility: No    Exercises Total Joint Exercises Ankle Circles/Pumps: AROM;Both;10 reps Quad Sets: AROM;Strengthening;Both;10 reps Heel Slides: AAROM;Strengthening;Left;10 reps Hip ABduction/ADduction: AAROM;Strengthening;Left;10 reps Straight Leg Raises: AAROM;Strengthening;Left;10 reps Long Arc Quad: AROM;Strengthening;Left;10 reps     PT Goals (current goals can now be found in the care plan section) Acute Rehab PT Goals PT Goal Formulation: With patient Time For Goal Achievement: 04/07/13 Potential to Achieve Goals: Good  Visit Information  Last PT Received On: 04/01/13 Assistance Needed: +1 History of Present Illness: Pt is s/p L TKA (03/30/13) due to arthritis.    Subjective Data      Cognition  Cognition Arousal/Alertness: Awake/alert Behavior During Therapy: WFL for tasks assessed/performed Overall Cognitive Status: Within Functional Limits for tasks assessed    Balance     End of Session PT - End of Session Activity Tolerance: Patient tolerated treatment well Patient left: in chair;with call bell/phone within reach Nurse Communication: Mobility status   GP     Lara Mulch 04/01/2013, 1:02 PM  Verdell Face, PTA [phone] 04/01/2013

## 2013-04-01 NOTE — Clinical Social Work Note (Signed)
Per Jasmine December at [place], [place] - SNF remains in negotiation with [employer]) regarding STR rates.  Jasmine December will contact CSW once rates have been established.  Pt wishes to be dc to SNF not home as pt lives alone.  Negotiations are not [facility] specific- the negotiation is SNF rates so choosing another SNF would not solve the negotiated rate issue.  Pt is aware and has been updated by both CSW and SNF.  Vickii Penna, LCSWA [phone]  Clinical Social Work

## 2013-04-02 LAB — CBC
HCT: 29.4 % — ABNORMAL LOW (ref 36.0–46.0)
Hemoglobin: 9.6 g/dL — ABNORMAL LOW (ref 12.0–15.0)
RBC: 3.38 MIL/uL — ABNORMAL LOW (ref 3.87–5.11)
RDW: 12.6 % (ref 11.5–15.5)
WBC: 9.4 10*3/uL (ref 4.0–10.5)

## 2013-04-02 LAB — BASIC METABOLIC PANEL
BUN: 13 mg/dL (ref 6–23)
CO2: 29 mEq/L (ref 19–32)
Chloride: 106 mEq/L (ref 96–112)
GFR calc Af Amer: 85 mL/min — ABNORMAL LOW (ref >90)
Glucose, Bld: 99 mg/dL (ref 70–99)
Potassium: 3.2 mEq/L — ABNORMAL LOW (ref 3.5–5.1)

## 2013-04-02 NOTE — Progress Notes (Signed)
Subjective: 3 Days Post-Op Procedure(s) (LRB): TOTAL KNEE ARTHROPLASTY (Left) Patient reports pain as mild and moderate.    Objective: Vital signs in last 24 hours: Temp:  [97.9 F (36.6 C)-98.2 F (36.8 C)] 98.2 F (36.8 C) (12/06 0532) Pulse Rate:  [92-103] 92 (12/06 0532) Resp:  [15-16] 16 (12/06 0532) BP: (126-145)/(62-77) 145/77 mmHg (12/06 0532) SpO2:  [94 %-100 %] 94 % (12/06 0532)  Intake/Output from previous day: 12/05 0701 - 12/06 0700 In: 340 [P.O.:240; I.V.:100] Out: -  Intake/Output this shift: Total I/O In: 240 [P.O.:240] Out: -    Recent Labs  03/31/13 0411 04/01/13 0600 04/02/13 0426  HGB 10.3* 9.8* 9.6*    Recent Labs  04/01/13 0600 04/02/13 0426  WBC 16.0* 9.4  RBC 3.41* 3.38*  HCT 29.2* 29.4*  PLT 218 225    Recent Labs  04/01/13 0600 04/02/13 0426  NA 142 142  K 3.3* 3.2*  CL 106 106  CO2 27 29  BUN 13 13  CREATININE 0.78 0.84  GLUCOSE 131* 99  CALCIUM 8.5 8.1*   No results found for this basename: LABPT, INR,  in the last 72 hours  Neurovascular intact Sensation intact distally Intact pulses distally Dorsiflexion/Plantar flexion intact Incision: dressing C/D/I  Assessment/Plan: 3 Days Post-Op Procedure(s) (LRB): TOTAL KNEE ARTHROPLASTY (Left) Up with therapy Discharge to SNF Awaiting approval for SNF may d/c once approved.  Margart Sickles 04/02/2013, 9:53 AM

## 2013-04-02 NOTE — Progress Notes (Addendum)
Clinical Social Worker (CSW) noted CSW consult dated 12/5. Weekday CSW has been working on SNF placement for patient. Weekend CSW left message for [name] at [facility] to inquire about insurance negotiation rates. Weekend CSW placed FL2 on patient's shadow chart for MD to sign and date. Weekday CSW will follow up.   Jetta Lout, LCSWA Weekend CSW [phone]    CSW received call back from Vernal at [facility] who reported that they do not know anything new about the insurance negotiation rate. [name] reported that she will know more on Monday and will follow up with weekday CSW.  Jetta Lout, LCSWA Weekend CSW [phone]

## 2013-04-02 NOTE — Progress Notes (Signed)
Orthopedic Tech Progress Note Patient Details:  [name] 11-Apr-1951 [id number]  Patient ID: [name], female   DOB: February 16, 1951, 62 y.o.   MRN: [id number] Placed pt's lle in cpm @ 0-60 degrees @ [address], [place] Lambing 04/02/2013, 8:13 PM

## 2013-04-02 NOTE — Progress Notes (Signed)
Physical Therapy Treatment Patient Details Name: [name] MRN: [id number] DOB: [birth year] Today's Date: 04/02/2013 Time: 7829-5621 PT Time Calculation (min): 23 min  PT Assessment / Plan / Recommendation  History of Present Illness Pt is s/p L TKA (03/30/13) due to arthritis.   PT Comments   Pt cont's to make progress with PT goals.  States Lt knee a little more sore today but still moving well.    Follow Up Recommendations  SNF     Does the patient have the potential to tolerate intense rehabilitation     Barriers to Discharge        Equipment Recommendations  None recommended by PT    Recommendations for Other Services    Frequency 7X/week   Progress towards PT Goals Progress towards PT goals: Progressing toward goals  Plan Current plan remains appropriate    Precautions / Restrictions Precautions Precautions: Knee;Fall Required Braces or Orthoses: Knee Immobilizer - Left Knee Immobilizer - Left: Discontinue post op day 2 Restrictions Weight Bearing Restrictions: Yes LLE Weight Bearing: Weight bearing as tolerated   Pertinent Vitals/Pain "it's sore".  premedicated.  Repositioned for comfort.      Mobility  Bed Mobility Bed Mobility: Supine to Sit;Sitting - Scoot to Edge of Bed Supine to Sit: 6: Modified independent (Device/Increase time);HOB flat Sitting - Scoot to Edge of Bed: 6: Modified independent (Device/Increase time) Transfers Transfers: Sit to Stand;Stand to Sit Sit to Stand: 5: Supervision;With upper extremity assist;From bed;From toilet Stand to Sit: 5: Supervision;With upper extremity assist;With armrests;To chair/3-in-1;To toilet Details for Transfer Assistance: Pt demonstrates safe hand placement Ambulation/Gait Ambulation/Gait Assistance: 5: Supervision Ambulation Distance (Feet): 80 Feet Assistive device: Rolling walker Ambulation/Gait Assistance Details: cues for increased step length RLE.   Gait Pattern: Step-to pattern;Step-through  pattern;Decreased step length - right General Gait Details: emerging into step through Stairs: No Wheelchair Mobility Wheelchair Mobility: No    Exercises Total Joint Exercises Ankle Circles/Pumps: AROM;Both;15 reps Quad Sets: AROM;Strengthening;Both;10 reps Heel Slides: AAROM;Strengthening;Left;10 reps Straight Leg Raises: Strengthening;Left;AAROM;Other reps (comment) (12 reps) Long Arc Quad: AROM;Strengthening;Left;Other reps (comment) (12 reps) Knee Flexion: AAROM;Left;5 reps;Seated    PT Goals (current goals can now be found in the care plan section) Acute Rehab PT Goals PT Goal Formulation: With patient Time For Goal Achievement: 04/07/13 Potential to Achieve Goals: Good  Visit Information  Last PT Received On: 04/02/13 Assistance Needed: +1 History of Present Illness: Pt is s/p L TKA (03/30/13) due to arthritis.    Subjective Data      Cognition  Cognition Arousal/Alertness: Awake/alert Behavior During Therapy: WFL for tasks assessed/performed Overall Cognitive Status: Within Functional Limits for tasks assessed    Balance     End of Session PT - End of Session Activity Tolerance: Patient tolerated treatment well Patient left: in chair;with call bell/phone within reach Nurse Communication: Mobility status   GP     Lara Mulch 04/02/2013, 10:53 AM  Verdell Face, PTA [phone] 04/02/2013

## 2013-04-03 NOTE — Progress Notes (Signed)
Physical Therapy Treatment Patient Details Name: [name] MRN: [id number] DOB: Sep 11, 1950 Today's Date: 04/03/2013 Time: 4098-1191 PT Time Calculation (min): 23 min  PT Assessment / Plan / Recommendation  History of Present Illness Pt is s/p L TKA (03/30/13) due to arthritis.   PT Comments   Pt moving well.  Still not at mod I level for all functional mobility therefore cont to recommendation of plan for SNF at d/c.     Follow Up Recommendations  SNF     Does the patient have the potential to tolerate intense rehabilitation     Barriers to Discharge        Equipment Recommendations  None recommended by PT    Recommendations for Other Services    Frequency 7X/week   Progress towards PT Goals Progress towards PT goals: Progressing toward goals  Plan Current plan remains appropriate    Precautions / Restrictions Precautions Precautions: Knee;Fall Restrictions LLE Weight Bearing: Weight bearing as tolerated   Pertinent Vitals/Pain "it's just sore" incisional site    Mobility  Bed Mobility Bed Mobility: Not assessed Transfers Transfers: Sit to Stand;Stand to Sit Sit to Stand: 6: Modified independent (Device/Increase time);With upper extremity assist;With armrests;From chair/3-in-1 Stand to Sit: 6: Modified independent (Device/Increase time);With upper extremity assist;With armrests;To chair/3-in-1 Details for Transfer Assistance: Encouraged pt to keep Lt foot back to work on ROM with stand>sit Ambulation/Gait Ambulation/Gait Assistance: 5: Supervision Ambulation Distance (Feet): 120 Feet Assistive device: Rolling walker Ambulation/Gait Assistance Details: Fluidity/smoothness of gait pattern improving.   Gait Pattern: Step-through pattern;Decreased step length - right;Decreased step length - left;Decreased stride length Stairs: No Wheelchair Mobility Wheelchair Mobility: No    Exercises Total Joint Exercises Ankle Circles/Pumps: AROM;Both;15 reps Quad Sets:  AROM;Both;15 reps Heel Slides: AAROM;Strengthening;Left;Other reps (comment) (12 reps) Hip ABduction/ADduction: AAROM;Strengthening;Left;Other reps (comment) (12 reps) Straight Leg Raises: AAROM;Left;Strengthening;Other reps (comment) (12 reps) Long Arc Quad: AROM;Strengthening;Left;Other reps (comment) (12 reps) Knee Flexion: AAROM;Left;10 reps Goniometric ROM: AAROM Lt knee flexion ~55 degrees in sitting     PT Goals (current goals can now be found in the care plan section) Acute Rehab PT Goals PT Goal Formulation: With patient Time For Goal Achievement: 04/07/13 Potential to Achieve Goals: Good  Visit Information  Last PT Received On: 04/03/13 Assistance Needed: +1 History of Present Illness: Pt is s/p L TKA (03/30/13) due to arthritis.    Subjective Data      Cognition  Cognition Arousal/Alertness: Awake/alert Behavior During Therapy: WFL for tasks assessed/performed Overall Cognitive Status: Within Functional Limits for tasks assessed    Balance     End of Session PT - End of Session Activity Tolerance: Patient tolerated treatment well Patient left: in chair;with call bell/phone within reach Nurse Communication: Mobility status   GP     Lara Mulch 04/03/2013, 11:41 AM   Verdell Face, PTA [phone] 04/03/2013

## 2013-04-03 NOTE — Progress Notes (Signed)
Subjective: 4 Days Post-Op Procedure(s) (LRB): TOTAL KNEE ARTHROPLASTY (Left) Patient reports pain as mild.    Objective: Vital signs in last 24 hours: Temp:  [97.7 F (36.5 C)-98.1 F (36.7 C)] 98.1 F (36.7 C) (12/07 0416) Pulse Rate:  [70-91] 70 (12/07 0416) Resp:  [16-18] 16 (12/07 0416) BP: (109-135)/(50-67) 119/66 mmHg (12/07 0416) SpO2:  [97 %-99 %] 98 % (12/07 0416)  Intake/Output from previous day: 12/06 0701 - 12/07 0700 In: 580 [P.O.:580] Out: -  Intake/Output this shift:     Recent Labs  04/01/13 0600 04/02/13 0426  HGB 9.8* 9.6*    Recent Labs  04/01/13 0600 04/02/13 0426  WBC 16.0* 9.4  RBC 3.41* 3.38*  HCT 29.2* 29.4*  PLT 218 225    Recent Labs  04/01/13 0600 04/02/13 0426  NA 142 142  K 3.3* 3.2*  CL 106 106  CO2 27 29  BUN 13 13  CREATININE 0.78 0.84  GLUCOSE 131* 99  CALCIUM 8.5 8.1*   No results found for this basename: LABPT, INR,  in the last 72 hours  Neurovascular intact Sensation intact distally Intact pulses distally Dorsiflexion/Plantar flexion intact Incision: dressing C/D/I  Assessment/Plan: 4 Days Post-Op Procedure(s) (LRB): TOTAL KNEE ARTHROPLASTY (Left) Up with therapy D/c planning possible d/c to SNF Monday per SW note, awaiting negotiations for placement  [name], [name] 04/03/2013, 9:44 AM

## 2013-04-04 NOTE — Progress Notes (Signed)
Occupational Therapy Treatment Patient Details Name: [name] MRN: [id number] DOB: [birth year] Today's Date: 04/04/2013 Time: 4098-1191 OT Time Calculation (min): 20 min  OT Assessment / Plan / Recommendation  History of present illness Pt is s/p L TKA (03/30/13) due to arthritis.   OT comments  Pt participated in ADL retraining session today for toileting, functional mobility and grooming standing at sink. Pt plans to d/c to SNF secondary to lives alone & needs to be independent w/ ADL's.  Follow Up Recommendations  SNF    Barriers to Discharge       Equipment Recommendations  Other (comment) (Defer to next venue)    Recommendations for Other Services    Frequency Min 2X/week   Progress towards OT Goals Progress towards OT goals: Progressing toward goals  Plan Discharge plan remains appropriate    Precautions / Restrictions Precautions Precautions: Knee;Fall Restrictions Weight Bearing Restrictions: Yes LLE Weight Bearing: Weight bearing as tolerated   Pertinent Vitals/Pain "It's sore" not painful    ADL  Grooming: Performed;Supervision/safety;Wash/dry hands;Wash/dry face;Teeth care;Brushing hair Where Assessed - Grooming: Supported standing Upper Body Bathing: Simulated;Set up Where Assessed - Upper Body Bathing: Unsupported sitting;Supported sitting Toilet Transfer: Performed;Min guard Toilet Transfer Method: Sit to Barista: Raised toilet seat with arms (or 3-in-1 over toilet) Toileting - Clothing Manipulation and Hygiene: Performed;Min guard Where Assessed - Glass blower/designer Manipulation and Hygiene: Standing Tub/Shower Transfer Method: Not assessed Equipment Used: Rolling walker;Other (comment) (3:1 over toilet in bathroom) Transfers/Ambulation Related to ADLs: Min guard-supervision level assist using RW ADL Comments: Pt was educated in grooming techniques standing at sink, she benefits from vc's for safety and placement of RW. Plan  for tub/shower transfers and/or LB dressing techniques next visit. Pt plans to d/c to SNF secondary to lives alone and needs to be modified independent transfers and ADL's.    OT Diagnosis:    OT Problem List:   OT Treatment Interventions:     OT Goals(current goals can now be found in the care plan section) Acute Rehab OT Goals Patient Stated Goal: To go to rehab to increase independence, prior to home alone. OT Goal Formulation: With patient Time For Goal Achievement: 04/07/13  Visit Information  Last OT Received On: 04/04/13 Assistance Needed: +1 History of Present Illness: Pt is s/p L TKA (03/30/13) due to arthritis.    Subjective Data      Prior Functioning       Cognition  Cognition Arousal/Alertness: Awake/alert Behavior During Therapy: WFL for tasks assessed/performed Overall Cognitive Status: Within Functional Limits for tasks assessed    Mobility  Bed Mobility Bed Mobility: Supine to Sit;Sitting - Scoot to Edge of Bed Supine to Sit: 6: Modified independent (Device/Increase time);HOB elevated Sitting - Scoot to Edge of Bed: 6: Modified independent (Device/Increase time) Transfers Transfers: Stand to Sit;Sit to Stand Sit to Stand: 6: Modified independent (Device/Increase time);With upper extremity assist;With armrests;From chair/3-in-1;From bed Stand to Sit: 6: Modified independent (Device/Increase time);With upper extremity assist;With armrests;To chair/3-in-1            End of Session OT - End of Session Equipment Utilized During Treatment: Rolling walker;Other (comment) (3:1 over toilet in bathroom) Activity Tolerance: Patient tolerated treatment well Patient left: in chair;with call bell/phone within reach  GO     Alm Bustard 04/04/2013, 12:22 PM

## 2013-04-04 NOTE — Progress Notes (Addendum)
Clinical Social Work Department CLINICAL SOCIAL WORK PLACEMENT NOTE 04/04/2013  Patient:  [name], [name]  Account Number:  [id number] Admit date:  03/30/2013  Clinical Social Worker:  Sharol Harness, Theresia Majors  Date/time:  04/04/2013 10:00 AM  Clinical Social Work is seeking post-discharge placement for this patient at the following level of care:   SKILLED NURSING   (*CSW will update this form in Epic as items are completed)   04/01/2013  Patient/family provided with [facility] Department of Clinical Social Work's list of facilities offering this level of care within the geographic area requested by the patient (or if unable, by the patient's family).  04/01/2013  Patient/family informed of their freedom to choose among providers that offer the needed level of care, that participate in Medicare, Medicaid or managed care program needed by the patient, have an available bed and are willing to accept the patient.  04/01/2013  Patient/family informed of [facility]' ownership interest in [facility], as well as of the fact that they are under no obligation to receive care at this facility.  PASARR submitted to EDS on 04/01/2013 PASARR number received from EDS on 04/01/2013  FL2 transmitted to all facilities in geographic area requested by pt/family on  04/01/2013 FL2 transmitted to all facilities within larger geographic area on   Patient informed that his/her managed care company has contracts with or will negotiate with  certain facilities, including the following:     Patient/family informed of bed offers received:  04/04/2013 Patient chooses bed at [facility] Physician recommends and patient chooses bed at    Patient to be transferred to [facility]  on  04/04/2013 Patient to be transferred to facility by [facility]  The following physician request were entered in Epic:   Additional Comments:  Natalya Domzalski, LCSWA [phone]

## 2013-04-04 NOTE — Progress Notes (Signed)
CSW Proofreader) had spoke with pt this morning. Pt has decided to proceed with a different facility since insurance issues continue with [facility]. Pt has chosen [facility]. CSW has spoken with pt case worker Marchelle Folks and [facility] [phone]. [phone]) and [facility] to update on situation. CSW has urged both parties to fast track process as pt is ready for dc.    Ceci Taliaferro, LCSWA [phone]

## 2013-04-04 NOTE — Progress Notes (Signed)
Physical Therapy Treatment Patient Details Name: [name] MRN: [id number] DOB: 16-Mar-1951 Today's Date: 04/04/2013 Time: 3086-5784 PT Time Calculation (min): 21 min  PT Assessment / Plan / Recommendation  History of Present Illness Pt is s/p L TKA (03/30/13) due to arthritis.   PT Comments   Patient lives alone and does not have adequate support to safely discharge home due to dependence on RW and decreased safety awareness with ambulation. Continue to recommend skilled nursing facility for post-acute therapy needs.   Follow Up Recommendations  SNF     Does the patient have the potential to tolerate intense rehabilitation     Barriers to Discharge        Equipment Recommendations  None recommended by PT    Recommendations for Other Services    Frequency 7X/week   Progress towards PT Goals Progress towards PT goals: Progressing toward goals  Plan Current plan remains appropriate    Precautions / Restrictions Precautions Precautions: Knee;Fall Restrictions Weight Bearing Restrictions: Yes LLE Weight Bearing: Weight bearing as tolerated   Pertinent Vitals/Pain Pt reports minimal pain throughout session.     Mobility  Bed Mobility Bed Mobility: Not assessed Supine to Sit: 6: Modified independent (Device/Increase time);HOB elevated Sitting - Scoot to Edge of Bed: 6: Modified independent (Device/Increase time) Details for Bed Mobility Assistance: Pt was received sitting in chair Transfers Transfers: Sit to Stand;Stand to Sit Sit to Stand: 6: Modified independent (Device/Increase time);With upper extremity assist;With armrests;From chair/3-in-1;From bed Stand to Sit: 6: Modified independent (Device/Increase time);With upper extremity assist;With armrests;To chair/3-in-1 Details for Transfer Assistance: Encouraged improved posture and safety awareness. Ambulation/Gait Ambulation/Gait Assistance: 4: Min guard Ambulation Distance (Feet): 100 Feet Assistive device:  Rolling walker Ambulation/Gait Assistance Details: VC's for improved posture, step-through gait pattern, and fluidity of walker movement.  Gait Pattern: Step-to pattern;Step-through pattern;Decreased stride length;Right foot flat;Left foot flat;Trunk flexed Gait velocity: Decreased General gait details: Not at Mod I level yet for safe d/c home alone. Stairs: No Wheelchair Mobility Wheelchair Mobility: No    Exercises Total Joint Exercises Heel Slides: 10 reps Goniometric ROM: 75 AAROM in sitting   PT Diagnosis:    PT Problem List:   PT Treatment Interventions:     PT Goals (current goals can now be found in the care plan section) Acute Rehab PT Goals Patient Stated Goal: To go to rehab to increase independence, prior to home alone. PT Goal Formulation: With patient Time For Goal Achievement: 04/07/13 Potential to Achieve Goals: Good  Visit Information  Last PT Received On: 04/04/13 Assistance Needed: +1 History of Present Illness: Pt is s/p L TKA (03/30/13) due to arthritis.    Subjective Data  Subjective: "I'm leaving in about an hour." Patient Stated Goal: To go to rehab to increase independence, prior to home alone.   Cognition  Cognition Arousal/Alertness: Awake/alert Behavior During Therapy: WFL for tasks assessed/performed Overall Cognitive Status: Within Functional Limits for tasks assessed    Balance     End of Session PT - End of Session Equipment Utilized During Treatment: Gait belt Activity Tolerance: Patient tolerated treatment well Patient left: in chair;with call bell/phone within reach Nurse Communication: Mobility status   GP     Ruthann Cancer 04/04/2013, 4:07 PM  Ruthann Cancer, PT, DPT [phone]

## 2013-04-04 NOTE — Anesthesia Postprocedure Evaluation (Signed)
  Anesthesia Post-op Note  Patient: [name]  Procedure(s) Performed: Procedure(s): TOTAL KNEE ARTHROPLASTY (Left)  Patient Location: Nursing Unit  Anesthesia Type:General  Level of Consciousness: awake, alert  and oriented  Airway and Oxygen Therapy: Patient Spontanous Breathing  Post-op Pain: none  Post-op Assessment: Post-op Vital signs reviewed, Patient's Cardiovascular Status Stable, Respiratory Function Stable, No signs of Nausea or vomiting, Adequate PO intake and Pain level controlled  Post-op Vital Signs: Reviewed and stable  Complications: No apparent anesthesia complications

## 2013-04-04 NOTE — Progress Notes (Signed)
Subjective: 5 Days Post-Op Procedure(s) (LRB): TOTAL KNEE ARTHROPLASTY (Left) Patient reports pain as 3 on 0-10 scale.  Patient continues to do very well.  Good bowel and bladder function.  Has been up with PT and is doing well.    Objective: Vital signs in last 24 hours: Temp:  [98.1 F (36.7 C)-98.6 F (37 C)] 98.6 F (37 C) (12/07 2143) Pulse Rate:  [92-95] 95 (12/07 2143) Resp:  [16-18] 18 (12/08 0400) BP: (118-137)/(62-70) 137/70 mmHg (12/07 2143) SpO2:  [97 %-98 %] 98 % (12/08 0400)  Intake/Output from previous day: 12/07 0701 - 12/08 0700 In: 1040 [P.O.:1040] Out: -  Intake/Output this shift: Total I/O In: 240 [P.O.:240] Out: -    Recent Labs  04/02/13 0426  HGB 9.6*    Recent Labs  04/02/13 0426  WBC 9.4  RBC 3.38*  HCT 29.4*  PLT 225    Recent Labs  04/02/13 0426  NA 142  K 3.2*  CL 106  CO2 29  BUN 13  CREATININE 0.84  GLUCOSE 99  CALCIUM 8.1*   No results found for this basename: LABPT, INR,  in the last 72 hours  Neurologically intact ABD soft Neurovascular intact Sensation intact distally Intact pulses distally Dorsiflexion/Plantar flexion intact  Assessment/Plan: 5 Days Post-Op Procedure(s) (LRB): TOTAL KNEE ARTHROPLASTY (Left) Advance diet Up with therapy Still awaiting placement to SNF.  Will hopefully be dc today.    Gearldine Shown 04/04/2013, 6:13 AM

## 2013-04-04 NOTE — Progress Notes (Signed)
Report called to receiving nurse at [facility] prior to patient D/C via ambulance.

## 2013-04-04 NOTE — Progress Notes (Signed)
CSW (Clinical Social Worker) prepared pt dc packet and placed with shadow chart. CSW arranged non-emergent ambulance transport. Pt, pt nurse, and facility informed. CSW signing off.  Meshulem Onorato, LCSWA [phone]  

## 2013-05-11 ENCOUNTER — Telehealth: Payer: Self-pay | Admitting: Oncology

## 2013-05-12 ENCOUNTER — Telehealth: Payer: Self-pay | Admitting: Oncology

## 2013-06-26 ENCOUNTER — Other Ambulatory Visit: Payer: Self-pay | Admitting: Oncology

## 2013-06-26 DIAGNOSIS — C50919 Malignant neoplasm of unspecified site of unspecified female breast: Secondary | ICD-10-CM

## 2013-06-27 ENCOUNTER — Other Ambulatory Visit (HOSPITAL_BASED_OUTPATIENT_CLINIC_OR_DEPARTMENT_OTHER): Payer: BC Managed Care – PPO

## 2013-06-27 ENCOUNTER — Other Ambulatory Visit: Payer: Self-pay | Admitting: *Deleted

## 2013-06-27 ENCOUNTER — Ambulatory Visit (HOSPITAL_BASED_OUTPATIENT_CLINIC_OR_DEPARTMENT_OTHER): Payer: BC Managed Care – PPO | Admitting: Oncology

## 2013-06-27 VITALS — BP 122/77 | HR 111 | Temp 98.0°F | Resp 18 | Ht 64.0 in | Wt 188.2 lb

## 2013-06-27 DIAGNOSIS — C50219 Malignant neoplasm of upper-inner quadrant of unspecified female breast: Secondary | ICD-10-CM

## 2013-06-27 DIAGNOSIS — Z170 Estrogen receptor positive status [ER+]: Secondary | ICD-10-CM

## 2013-06-27 DIAGNOSIS — C50919 Malignant neoplasm of unspecified site of unspecified female breast: Secondary | ICD-10-CM

## 2013-06-27 DIAGNOSIS — C73 Malignant neoplasm of thyroid gland: Secondary | ICD-10-CM

## 2013-06-27 LAB — CBC WITH DIFFERENTIAL/PLATELET
BASO%: 0.4 % (ref 0.0–2.0)
Basophils Absolute: 0 10*3/uL (ref 0.0–0.1)
EOS%: 0.8 % (ref 0.0–7.0)
Eosinophils Absolute: 0.1 10*3/uL (ref 0.0–0.5)
HEMATOCRIT: 37.6 % (ref 34.8–46.6)
HGB: 12.1 g/dL (ref 11.6–15.9)
LYMPH%: 28.1 % (ref 14.0–49.7)
MCH: 27.8 pg (ref 25.1–34.0)
MCHC: 32.2 g/dL (ref 31.5–36.0)
MCV: 86.5 fL (ref 79.5–101.0)
MONO#: 0.4 10*3/uL (ref 0.1–0.9)
MONO%: 6.1 % (ref 0.0–14.0)
NEUT#: 4.1 10*3/uL (ref 1.5–6.5)
NEUT%: 64.6 % (ref 38.4–76.8)
Platelets: 287 10*3/uL (ref 145–400)
RBC: 4.35 10*6/uL (ref 3.70–5.45)
RDW: 13 % (ref 11.2–14.5)
WBC: 6.4 10*3/uL (ref 3.9–10.3)
lymph#: 1.8 10*3/uL (ref 0.9–3.3)

## 2013-06-27 LAB — COMPREHENSIVE METABOLIC PANEL (CC13)
ALK PHOS: 116 U/L (ref 40–150)
ALT: 15 U/L (ref 0–55)
ANION GAP: 8 meq/L (ref 3–11)
AST: 17 U/L (ref 5–34)
Albumin: 3.7 g/dL (ref 3.5–5.0)
BILIRUBIN TOTAL: 0.38 mg/dL (ref 0.20–1.20)
BUN: 12.5 mg/dL (ref 7.0–26.0)
CO2: 30 meq/L — AB (ref 22–29)
CREATININE: 0.8 mg/dL (ref 0.6–1.1)
Calcium: 9.8 mg/dL (ref 8.4–10.4)
Chloride: 106 mEq/L (ref 98–109)
Glucose: 108 mg/dl (ref 70–140)
Potassium: 3.5 mEq/L (ref 3.5–5.1)
SODIUM: 144 meq/L (ref 136–145)
Total Protein: 7.4 g/dL (ref 6.4–8.3)

## 2013-06-27 NOTE — Progress Notes (Signed)
ID: [name]   DOB: 1950/05/29  MR#: [id number]  CSN#:[id number]  PCP: Geraldo Pitter, MD GYN: SUManus Rudd OTHER MD: Tawnya Crook, Enrigue Catena Rosen  HISTORY OF PRESENT ILLNESS: She had injured her right breast slightly and when that resolved, she noted a little bit of a dimple in the breast, and this concerned her so she not only made an appointment with Dr. Parke Simmers, but she scheduled herself at [facility] for mammograms.  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    With this information, she was referred to Dr. Lurene Shadow who on 03-17-03 proceeded to right lumpectomy and sentinel lymph node biopsy.  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  Her final pathology report ([id number]) showed a 2.6 cm. Grade 2 with no evidence of lymphovascular invasion, negative margins and the sentinel lymph node not involved.    INTERVAL HISTORY: [name] returns for followup of her breast cancer. Since her last visit here she underwent resection of a papillary thyroid carcinoma, with a right thyroid lobectomy 09/22/2012, the tumor measuring 5.5 cm, with a positive margin, which was cleared with left lobectomy 10/21/2012. She also had a left knee replacement in December of 2014 under [facility]  REVIEW OF SYSTEMS: She has lost about 20 pounds since her thyroid surgery. She feels her weight is now more stable. She has lost some hair. She is not exercising regularly although she is doing some of her physical therapy exercises still from the knee replacement. She is tolerating the anastrozole with no unusual side effects. A detailed review of systems today was otherwise noncontributory  PAST MEDICAL HISTORY: Past Medical History  Diagnosis Date  . Joint pain   . Joint swelling   . Bruises easily   . Arthritis   . Hypothyroidism     takes  Synthroid daily  . Hypertension     takes Tribenzor daily  . Sleep apnea     has cpap but doesn't use;sleep study done 3-27yrs ago  . History of bronchitis     when she was young  . Pneumonia     hx of when she was young  . Cancer 2004    rt breast;takes Arimidex daily;thyroid cancer  . Urinary frequency     PAST SURGICAL HISTORY: Past Surgical History  Procedure Laterality Date  . Breast lumpectomy  2004  . Portacath placement  2004  . Appendectomy  2007  . Knee arthroscopy  2007  . Anterior cervical decomp/discectomy fusion  2008  . Porta cath remove  2005  . Knee arthroscopy  05/02/2011    Procedure: ARTHROSCOPY KNEE;  Surgeon: Javier Docker;  Location: [facility];  Service: Orthopedics;  Laterality: Right;  . Meniscus debridement  05/02/2011    Procedure: DEBRIDEMENT OF MENISCUS;  Surgeon: Javier Docker;  Location: [facility];  Service: Orthopedics;  Laterality: Right;  . Portacath removal    . Colonoscopy    . Cesarean section  40yrs ago  . Colonoscopy    . Total knee arthroplasty  04/14/2012    Procedure: TOTAL KNEE ARTHROPLASTY;  Surgeon: Loreta Ave, MD;  Location: [facility] OR;  Service: Orthopedics;  Laterality: Right;  RIGHT ARTHROPLASTY KNEE MEDIAL/LATERAL COMPARTMENTS WITH PATELLA RESURFACING  . Thyroid lobectomy Right 09/22/2012    Dr Pollyann Kennedy  . Thyroidectomy N/A 09/22/2012  Procedure: RIGHT THYROID LOBECTOMY WITH FROZEN SECTION;  Surgeon: Izora Gala, MD;  Location: [place];  Service: ENT;  Laterality: N/A;  . Thyroidectomy Left 10/21/2012    Procedure: COMPLETION OF THYROIDECTOMY;  Surgeon: Izora Gala, MD;  Location: [place];  Service: ENT;  Laterality: Left;  . Total knee arthroplasty Left 03/30/2013    Procedure: TOTAL KNEE ARTHROPLASTY;  Surgeon: Ninetta Lights, MD;  Location: [place];  Service: Orthopedics;  Laterality: Left;    FAMILY HISTORY The patient's mother is alive at age 82.  She was diagnosed with breast cancer at age 27.   She is doing fine on Arimidex.  The patient's father died from liver cancer in his 67's.  The patient has one brother and two sisters with no other cancer in the family.  GYNECOLOGIC HISTORY: GX P1.  Her pregnancy to term was age 49. 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  Her son, Margarite Gouge, lives in [place], is a Herbalist and she has a grandson, 55, living here in town and sometimes staying with her.  The patient is a member of [employer]: Not in place  HEALTH MAINTENANCE: History  Substance Use Topics  . Smoking status: Never Smoker   . Smokeless tobacco: Never Used  . Alcohol Use: 0.0 oz/week     Comment: socially     Colonoscopy:  PAP:  Bone density: 09/08/2011, normal  Lipid panel:  Allergies  Allergen Reactions  . Penicillins Other (See Comments)    Childhood reaction    Current Outpatient Prescriptions  Medication Sig Dispense Refill  . anastrozole (ARIMIDEX) 1 MG tablet Take 1 mg by mouth daily.      Marland Kitchen aspirin EC 325 MG EC tablet Take 1 tablet (325 mg total) by mouth daily with breakfast. Take once daily for 4 weeks post-op for prevention of blood clots.  30 tablet  0  . levothyroxine (SYNTHROID, LEVOTHROID) 137 MCG tablet Take 137 mcg by mouth daily before breakfast.      . Multiple Vitamin (MULTIVITAMIN WITH MINERALS) TABS Take 1 tablet by mouth daily.      . Olmesartan-Amlodipine-HCTZ (TRIBENZOR) 40-10-25 MG TABS Take 1 tablet by mouth daily.       Marland Kitchen oxyCODONE-acetaminophen (PERCOCET/ROXICET) 5-325 MG per tablet Take 1-2 tablets by mouth every 4 (four) hours as needed for moderate pain or severe pain.  60 tablet  0  . potassium chloride (MICRO-K) 10 MEQ CR capsule Take 2 capsules (20 mEq total) by mouth daily.  180 capsule  3  . promethazine (PHENERGAN) 25 MG suppository Place 1 suppository (25 mg total) rectally every 6 (six) hours as needed for  nausea.  12 each  0  . triamcinolone cream (KENALOG) 0.1 % Apply 1 application topically 2 (two) times daily.       No current facility-administered medications for this visit.    OBJECTIVE: Middle-aged [place] American woman who who appears stated ag1 Filed Vitals:   06/27/13 1336  BP: 122/77  Pulse: 111  Temp: 98 F (36.7 C)  Resp: 18     Body mass index is 32.29 kg/(m^2).    ECOG FS: 0  No head enlargement Sclerae unicteric, pupils equal and round, no tongue lesions Oropharynx clear, teeth in moderate repair; neck incision healed nicely No cervical or supraclavicular adenopathy Lungs no rales or rhonchi Heart regular rate and rhythm Abd soft, obese, nontender, positive bowel  sounds;  MSK no focal spinal tenderness Neuro: nonfocal, well oriented, pleasant affect Breasts: The right breast is status post lumpectomy and radiation. There is no evidence of local recurrence. The right axilla is benign. The left breast is unremarkable Skin: No hamartomas  LAB RESULTS: Lab Results  Component Value Date   WBC 6.4 06/27/2013   NEUTROABS 4.1 06/27/2013   HGB 12.1 06/27/2013   HCT 37.6 06/27/2013   MCV 86.5 06/27/2013   PLT 287 06/27/2013      Chemistry      Component Value Date/Time   NA 142 04/02/2013 0426   NA 143 06/14/2012 1618   K 3.2* 04/02/2013 0426   K 3.2* 06/14/2012 1618   CL 106 04/02/2013 0426   CL 104 06/14/2012 1618   CO2 29 04/02/2013 0426   CO2 28 06/14/2012 1618   BUN 13 04/02/2013 0426   BUN 11.6 06/14/2012 1618   CREATININE 0.84 04/02/2013 0426   CREATININE 1.0 06/14/2012 1618      Component Value Date/Time   CALCIUM 8.1* 04/02/2013 0426   CALCIUM 9.5 06/14/2012 1618   ALKPHOS 108 06/14/2012 1618   ALKPHOS 96 04/14/2012 0801   AST 15 06/14/2012 1618   AST 18 04/14/2012 0801   ALT 14 06/14/2012 1618   ALT 18 04/14/2012 0801   BILITOT 0.32 06/14/2012 1618   BILITOT 0.4 04/14/2012 0801       Lab Results  Component Value Date   LABCA2 23 06/10/2011    No results  found for this basename: INR,  in the last 168 hours  No results found for this basename: UACOL, UAPR, USPG, UPH, UTP, UGL, UKET, UBIL, UHGB, UNIT, UROB, ULEU, UEPI, UWBC, URBC, UBAC, CAST, CRYS, UCOM, BILUA,  in the last 72 hours   STUDIES: No results found. [facility]  ASSESSMENT: 63 y.o.   woman   (1) status post right lumpectomy with sentinel node dissection November 2004 for a T2N1, stage IIB invasive ductal carcinoma, grade 2, ER/PR positive, HER-2/neu negative.  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  Status post radiation therapy completed June 2005.  Was started on tamoxifen at that time and continued for 5 years, after which she switched over to anastrozole.   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 She has non-of the other stigmata. Accordingly we are not proceeding to genetic testing.  As far as her breast cancer is concerned, she is now a little over 10 years out from her definitive surgery. She will have completed 10 years of antiestrogens when she runs out of anastrozole. Accordingly I am comfortable releasing her to her primary care physician.  Aleyna understands we would be glad to see her on an as-needed basis in the future, but as of now we are making no routine followup appointment for her here. As far as breast cancer followup is concerned, although she will need is yearly mammography and yearly physician breast exam.   Cutberto Winfree C    06/27/2013

## 2013-06-28 ENCOUNTER — Other Ambulatory Visit: Payer: Self-pay | Admitting: *Deleted

## 2013-06-28 ENCOUNTER — Other Ambulatory Visit: Payer: Self-pay | Admitting: Oncology

## 2013-06-28 ENCOUNTER — Telehealth: Payer: Self-pay | Admitting: Oncology

## 2013-06-28 MED ORDER — POTASSIUM CHLORIDE ER 10 MEQ PO CPCR: 20.0000 meq | ORAL_CAPSULE | Freq: Every day | ORAL | Status: DC

## 2013-06-28 NOTE — Telephone Encounter (Signed)
Sent letter to Dr. Lucianne Lei office  from Dr. Jana Hakim.

## 2013-06-28 NOTE — Addendum Note (Signed)
Addended by: Laureen Abrahams on: 06/28/2013 05:28 PM   Modules accepted: Orders

## 2013-06-29 NOTE — Telephone Encounter (Signed)
Arimidex discontinued. K+ refilled X 1 with future refills to PCP

## 2013-09-05 ENCOUNTER — Other Ambulatory Visit: Payer: BC Managed Care – PPO

## 2013-09-12 ENCOUNTER — Ambulatory Visit: Payer: BC Managed Care – PPO | Admitting: Oncology

## 2013-10-31 ENCOUNTER — Other Ambulatory Visit: Payer: Self-pay

## 2013-10-31 DIAGNOSIS — Z1231 Encounter for screening mammogram for malignant neoplasm of breast: Secondary | ICD-10-CM

## 2014-01-05 ENCOUNTER — Ambulatory Visit: Payer: BC Managed Care – PPO

## 2014-01-13 ENCOUNTER — Ambulatory Visit: Payer: BC Managed Care – PPO

## 2014-01-20 ENCOUNTER — Ambulatory Visit: Payer: BC Managed Care – PPO

## 2014-01-26 ENCOUNTER — Ambulatory Visit
Admission: RE | Admit: 2014-01-26 | Discharge: 2014-01-26 | Disposition: A | Discharge status: Home / Self Care | Payer: BC Managed Care – PPO | Source: Ambulatory Visit

## 2014-01-26 DIAGNOSIS — Z1231 Encounter for screening mammogram for malignant neoplasm of breast: Secondary | ICD-10-CM

## 2014-04-24 LAB — PROCEDURE REPORT - SCANNED: PAP SMEAR: NEGATIVE

## 2014-06-16 ENCOUNTER — Other Ambulatory Visit (HOSPITAL_COMMUNITY): Payer: Self-pay | Admitting: Endocrinology

## 2014-06-16 DIAGNOSIS — C73 Malignant neoplasm of thyroid gland: Secondary | ICD-10-CM

## 2014-06-26 ENCOUNTER — Encounter (HOSPITAL_COMMUNITY)
Admission: RE | Admit: 2014-06-26 | Discharge: 2014-06-26 | Disposition: A | Discharge status: Home / Self Care | Payer: 59 | Source: Ambulatory Visit | Attending: Endocrinology | Admitting: Endocrinology

## 2014-06-26 DIAGNOSIS — C73 Malignant neoplasm of thyroid gland: Secondary | ICD-10-CM | POA: Insufficient documentation

## 2014-06-26 MED ORDER — THYROTROPIN ALFA 1.1 MG IM SOLR
0.9000 mg | INTRAMUSCULAR | Status: AC
  Administered 2014-06-26: 0.9 mg via INTRAMUSCULAR

## 2014-06-27 ENCOUNTER — Encounter (HOSPITAL_COMMUNITY)
Admission: RE | Admit: 2014-06-27 | Discharge: 2014-06-27 | Disposition: A | Discharge status: Home / Self Care | Payer: 59 | Source: Ambulatory Visit | Attending: Endocrinology | Admitting: Endocrinology

## 2014-06-27 MED ORDER — THYROTROPIN ALFA 1.1 MG IM SOLR
0.9000 mg | INTRAMUSCULAR | Status: AC
  Administered 2014-06-27: 0.9 mg via INTRAMUSCULAR

## 2014-06-28 ENCOUNTER — Encounter (HOSPITAL_COMMUNITY)
Admission: RE | Admit: 2014-06-28 | Discharge: 2014-06-28 | Disposition: A | Discharge status: Home / Self Care | Payer: 59 | Source: Ambulatory Visit | Attending: Endocrinology | Admitting: Endocrinology

## 2014-06-28 DIAGNOSIS — C73 Malignant neoplasm of thyroid gland: Secondary | ICD-10-CM | POA: Insufficient documentation

## 2014-06-28 MED ORDER — SODIUM IODIDE I 131 CAPSULE: 4.0000 | Freq: Once | INTRAVENOUS | Status: AC | PRN

## 2014-06-30 ENCOUNTER — Encounter (HOSPITAL_COMMUNITY)
Admission: RE | Admit: 2014-06-30 | Discharge: 2014-06-30 | Disposition: A | Discharge status: Home / Self Care | Payer: 59 | Source: Ambulatory Visit | Attending: Endocrinology | Admitting: Endocrinology

## 2014-06-30 DIAGNOSIS — C73 Malignant neoplasm of thyroid gland: Secondary | ICD-10-CM | POA: Insufficient documentation

## 2014-10-05 ENCOUNTER — Ambulatory Visit
Admission: RE | Admit: 2014-10-05 | Discharge: 2014-10-05 | Disposition: A | Discharge status: Home / Self Care | Payer: 59 | Source: Ambulatory Visit | Attending: Family Medicine | Admitting: Family Medicine

## 2014-10-05 ENCOUNTER — Other Ambulatory Visit: Payer: Self-pay | Admitting: Family Medicine

## 2014-10-05 DIAGNOSIS — M2550 Pain in unspecified joint: Secondary | ICD-10-CM

## 2014-12-15 ENCOUNTER — Other Ambulatory Visit: Payer: Self-pay

## 2014-12-15 DIAGNOSIS — Z1231 Encounter for screening mammogram for malignant neoplasm of breast: Secondary | ICD-10-CM

## 2015-02-27 ENCOUNTER — Ambulatory Visit: Payer: 59

## 2015-04-04 ENCOUNTER — Ambulatory Visit: Payer: 59

## 2015-05-04 ENCOUNTER — Ambulatory Visit: Payer: 59

## 2015-05-28 ENCOUNTER — Ambulatory Visit: Payer: 59

## 2015-06-14 ENCOUNTER — Other Ambulatory Visit: Payer: Self-pay | Admitting: Endocrinology

## 2015-06-14 DIAGNOSIS — Z780 Asymptomatic menopausal state: Secondary | ICD-10-CM

## 2015-07-09 ENCOUNTER — Ambulatory Visit: Payer: Self-pay

## 2015-07-13 ENCOUNTER — Ambulatory Visit
Admission: RE | Admit: 2015-07-13 | Discharge: 2015-07-13 | Disposition: A | Discharge status: Home / Self Care | Payer: 59 | Source: Ambulatory Visit

## 2015-07-13 DIAGNOSIS — Z1231 Encounter for screening mammogram for malignant neoplasm of breast: Secondary | ICD-10-CM

## 2015-11-21 ENCOUNTER — Other Ambulatory Visit: Payer: Self-pay | Admitting: Obstetrics and Gynecology

## 2015-11-21 DIAGNOSIS — M810 Age-related osteoporosis without current pathological fracture: Secondary | ICD-10-CM

## 2016-01-11 ENCOUNTER — Encounter: Payer: Self-pay | Admitting: Gastroenterology

## 2016-01-23 ENCOUNTER — Encounter: Payer: Self-pay | Admitting: *Deleted

## 2016-01-25 ENCOUNTER — Ambulatory Visit (AMBULATORY_SURGERY_CENTER): Payer: Self-pay

## 2016-01-25 VITALS — Ht 64.0 in | Wt 215.8 lb

## 2016-01-25 DIAGNOSIS — Z1211 Encounter for screening for malignant neoplasm of colon: Secondary | ICD-10-CM

## 2016-01-25 MED ORDER — SUPREP BOWEL PREP KIT 17.5-3.13-1.6 GM/177ML PO SOLN: 1.0000 | Freq: Once | ORAL | 0 refills | Status: AC

## 2016-01-25 NOTE — Progress Notes (Signed)
No allergies to eggs or soy No past problems with anesthesia No diet meds No home oxygen  Declined emmi 

## 2016-02-04 ENCOUNTER — Encounter: Payer: 59 | Admitting: Gastroenterology

## 2016-03-28 ENCOUNTER — Encounter: Payer: 59 | Admitting: Gastroenterology

## 2016-04-28 DIAGNOSIS — D0512 Intraductal carcinoma in situ of left breast: Secondary | ICD-10-CM

## 2016-04-28 HISTORY — DX: Intraductal carcinoma in situ of left breast: D05.12

## 2016-05-09 ENCOUNTER — Ambulatory Visit (AMBULATORY_SURGERY_CENTER): Payer: 59 | Admitting: Gastroenterology

## 2016-05-09 ENCOUNTER — Encounter: Payer: Self-pay | Admitting: Gastroenterology

## 2016-05-09 VITALS — BP 114/62 | HR 83 | Temp 98.4°F | Resp 16 | Ht 64.0 in | Wt 215.0 lb

## 2016-05-09 DIAGNOSIS — Z1211 Encounter for screening for malignant neoplasm of colon: Secondary | ICD-10-CM

## 2016-05-09 DIAGNOSIS — Z1212 Encounter for screening for malignant neoplasm of rectum: Secondary | ICD-10-CM | POA: Diagnosis not present

## 2016-05-09 MED ORDER — SODIUM CHLORIDE 0.9 % IV SOLN: 500.0000 mL | INTRAVENOUS | Status: DC

## 2016-05-09 NOTE — Progress Notes (Signed)
Patient awakening,vss,report to rn 

## 2016-05-09 NOTE — Patient Instructions (Signed)
Impressions/recommendations:  Hemorrhoids (handout given)  Repeat colonoscopy in 10 years.  YOU HAD AN ENDOSCOPIC PROCEDURE TODAY AT [facility]:   Refer to the procedure report that was given to you for any specific questions about what was found during the examination.  If the procedure report does not answer your questions, please call your gastroenterologist to clarify.  If you requested that your care partner not be given the details of your procedure findings, then the procedure report has been included in a sealed envelope for you to review at your convenience later.  YOU SHOULD EXPECT: Some feelings of bloating in the abdomen. Passage of more gas than usual.  Walking can help get rid of the air that was put into your GI tract during the procedure and reduce the bloating. If you had a lower endoscopy (such as a colonoscopy or flexible sigmoidoscopy) you may notice spotting of blood in your stool or on the toilet paper. If you underwent a bowel prep for your procedure, you may not have a normal bowel movement for a few days.  Please Note:  You might notice some irritation and congestion in your nose or some drainage.  This is from the oxygen used during your procedure.  There is no need for concern and it should clear up in a day or so.  SYMPTOMS TO REPORT IMMEDIATELY:   Following lower endoscopy (colonoscopy or flexible sigmoidoscopy):  Excessive amounts of blood in the stool  Significant tenderness or worsening of abdominal pains  Swelling of the abdomen that is new, acute  Fever of 100F or higher  For urgent or emergent issues, a gastroenterologist can be reached at any hour by calling [phone].   DIET:  We do recommend a small meal at first, but then you may proceed to your regular diet.  Drink plenty of fluids but you should avoid alcoholic beverages for 24 hours.  ACTIVITY:  You should plan to take it easy for the rest of today and you should NOT DRIVE or  use heavy machinery until tomorrow (because of the sedation medicines used during the test).    FOLLOW UP: Our staff will call the number listed on your records the next business day following your procedure to check on you and address any questions or concerns that you may have regarding the information given to you following your procedure. If we do not reach you, we will leave a message.  However, if you are feeling well and you are not experiencing any problems, there is no need to return our call.  We will assume that you have returned to your regular daily activities without incident.  If any biopsies were taken you will be contacted by phone or by letter within the next 1-3 weeks.  Please call us at [phone] if you have not heard about the biopsies in 3 weeks.    SIGNATURES/CONFIDENTIALITY: You and/or your care partner have signed paperwork which will be entered into your electronic medical record.  These signatures attest to the fact that that the information above on your After Visit Summary has been reviewed and is understood.  Full responsibility of the confidentiality of this discharge information lies with you and/or your care-partner.

## 2016-05-09 NOTE — Op Note (Signed)
[place] Patient Name: [name] Procedure Date: 05/09/2016 11:06 AM MRN: [id number] Endoscopist: Remo Lipps P. Izeyah Deike MD, MD Age: 66 Referring MD:  Date of Birth: [birth year] Gender: Female Account #: [id number] Procedure:                Colonoscopy Indications:              Screening for colorectal malignant neoplasm Medicines:                Monitored Anesthesia Care Procedure:                Pre-Anesthesia Assessment:                           - Prior to the procedure, a History and Physical                            was performed, and patient medications and                            allergies were reviewed. The patient's tolerance of                            previous anesthesia was also reviewed. The risks                            and benefits of the procedure and the sedation                            options and risks were discussed with the patient.                            All questions were answered, and informed consent                            was obtained. Prior Anticoagulants: The patient has                            taken no previous anticoagulant or antiplatelet                            agents. ASA Grade Assessment: II - A patient with                            mild systemic disease. After reviewing the risks                            and benefits, the patient was deemed in                            satisfactory condition to undergo the procedure.                           After obtaining informed consent, the colonoscope  was passed under direct vision. Throughout the                            procedure, the patient's blood pressure, pulse, and                            oxygen saturations were monitored continuously. The                            Model PCF-H190DL [id number]) scope was introduced                            through the anus and advanced to the the cecum,   identified by appendiceal orifice and ileocecal                            valve. The colonoscopy was performed without                            difficulty. The patient tolerated the procedure                            well. The quality of the bowel preparation was                            good. The ileocecal valve, appendiceal orifice, and                            rectum were photographed. Scope In: 11:09:40 AM Scope Out: 11:25:28 AM Scope Withdrawal Time: 0 hours 11 minutes 54 seconds  Total Procedure Duration: 0 hours 15 minutes 48 seconds  Findings:                 The perianal and digital rectal examinations were                            normal.                           Internal hemorrhoids were found during retroflexion.                           The right colon was tortous. The exam was otherwise                            without abnormality. No polyps Complications:            No immediate complications. Estimated blood loss:                            None. Estimated Blood Loss:     Estimated blood loss: none. Impression:               - Internal hemorrhoids.                           - The examination was otherwise  normal.                           - No specimens collected. Recommendation:           - Patient has a contact number available for                            emergencies. The signs and symptoms of potential                            delayed complications were discussed with the                            patient. Return to normal activities tomorrow.                            Written discharge instructions were provided to the                            patient.                           - Resume previous diet.                           - Continue present medications.                           - Repeat colonoscopy in 10 years for screening                            purposes. Remo Lipps P. Djon Tith MD, MD 05/09/2016 11:28:47 AM This report has been  signed electronically.

## 2016-05-12 ENCOUNTER — Telehealth: Payer: Self-pay

## 2016-05-12 NOTE — Telephone Encounter (Signed)
  Follow up Call-  Call back number 05/09/2016  Post procedure Call Back phone  # [phone]  Permission to leave phone message Yes  Some recent data might be hidden     Patient questions:  Do you have a fever, pain , or abdominal swelling? No. Pain Score  0 *  Have you tolerated food without any problems? Yes.    Have you been able to return to your normal activities? Yes.    Do you have any questions about your discharge instructions: Diet   No. Medications  No. Follow up visit  No.  Do you have questions or concerns about your Care? No.  Actions: * If pain score is 4 or above: No action needed, pain <4.

## 2016-05-19 DIAGNOSIS — M19032 Primary osteoarthritis, left wrist: Secondary | ICD-10-CM | POA: Diagnosis not present

## 2016-06-03 ENCOUNTER — Other Ambulatory Visit: Payer: Self-pay | Admitting: Family Medicine

## 2016-06-03 DIAGNOSIS — E890 Postprocedural hypothyroidism: Secondary | ICD-10-CM | POA: Diagnosis not present

## 2016-06-03 DIAGNOSIS — C73 Malignant neoplasm of thyroid gland: Secondary | ICD-10-CM | POA: Diagnosis not present

## 2016-06-03 DIAGNOSIS — Z1231 Encounter for screening mammogram for malignant neoplasm of breast: Secondary | ICD-10-CM

## 2016-07-09 DIAGNOSIS — M19032 Primary osteoarthritis, left wrist: Secondary | ICD-10-CM | POA: Diagnosis not present

## 2016-07-16 ENCOUNTER — Ambulatory Visit: Payer: 59

## 2016-08-06 ENCOUNTER — Ambulatory Visit: Payer: 59

## 2016-08-06 DIAGNOSIS — M1812 Unilateral primary osteoarthritis of first carpometacarpal joint, left hand: Secondary | ICD-10-CM | POA: Diagnosis not present

## 2016-08-14 DIAGNOSIS — M79645 Pain in left finger(s): Secondary | ICD-10-CM | POA: Diagnosis not present

## 2016-08-14 DIAGNOSIS — M4722 Other spondylosis with radiculopathy, cervical region: Secondary | ICD-10-CM | POA: Diagnosis not present

## 2016-08-14 DIAGNOSIS — Z6838 Body mass index (BMI) 38.0-38.9, adult: Secondary | ICD-10-CM | POA: Diagnosis not present

## 2016-08-14 DIAGNOSIS — M542 Cervicalgia: Secondary | ICD-10-CM | POA: Diagnosis not present

## 2016-08-14 DIAGNOSIS — I10 Essential (primary) hypertension: Secondary | ICD-10-CM | POA: Diagnosis not present

## 2016-08-14 DIAGNOSIS — M5030 Other cervical disc degeneration, unspecified cervical region: Secondary | ICD-10-CM | POA: Diagnosis not present

## 2016-08-19 DIAGNOSIS — E890 Postprocedural hypothyroidism: Secondary | ICD-10-CM | POA: Diagnosis not present

## 2016-08-21 ENCOUNTER — Ambulatory Visit
Admission: RE | Admit: 2016-08-21 | Discharge: 2016-08-21 | Disposition: A | Discharge status: Home / Self Care | Payer: 59 | Source: Ambulatory Visit | Attending: Family Medicine | Admitting: Family Medicine

## 2016-08-21 DIAGNOSIS — Z1231 Encounter for screening mammogram for malignant neoplasm of breast: Secondary | ICD-10-CM

## 2016-08-21 HISTORY — DX: Personal history of irradiation: Z92.3

## 2016-08-21 HISTORY — DX: Personal history of antineoplastic chemotherapy: Z92.21

## 2016-08-22 ENCOUNTER — Other Ambulatory Visit: Payer: Self-pay | Admitting: Family Medicine

## 2016-08-22 DIAGNOSIS — R928 Other abnormal and inconclusive findings on diagnostic imaging of breast: Secondary | ICD-10-CM

## 2016-08-27 ENCOUNTER — Other Ambulatory Visit: Payer: Self-pay | Admitting: Oncology

## 2016-08-27 ENCOUNTER — Ambulatory Visit
Admission: RE | Admit: 2016-08-27 | Discharge: 2016-08-27 | Disposition: A | Discharge status: Home / Self Care | Payer: 59 | Source: Ambulatory Visit | Attending: Family Medicine | Admitting: Family Medicine

## 2016-08-27 DIAGNOSIS — R921 Mammographic calcification found on diagnostic imaging of breast: Secondary | ICD-10-CM | POA: Diagnosis not present

## 2016-08-27 DIAGNOSIS — R928 Other abnormal and inconclusive findings on diagnostic imaging of breast: Secondary | ICD-10-CM

## 2016-08-29 DIAGNOSIS — Z853 Personal history of malignant neoplasm of breast: Secondary | ICD-10-CM | POA: Diagnosis not present

## 2016-08-29 DIAGNOSIS — M4802 Spinal stenosis, cervical region: Secondary | ICD-10-CM | POA: Diagnosis not present

## 2016-08-29 DIAGNOSIS — M47812 Spondylosis without myelopathy or radiculopathy, cervical region: Secondary | ICD-10-CM | POA: Diagnosis not present

## 2016-09-11 DIAGNOSIS — M5412 Radiculopathy, cervical region: Secondary | ICD-10-CM | POA: Diagnosis not present

## 2016-09-11 DIAGNOSIS — G8929 Other chronic pain: Secondary | ICD-10-CM | POA: Diagnosis not present

## 2016-09-11 DIAGNOSIS — M4722 Other spondylosis with radiculopathy, cervical region: Secondary | ICD-10-CM | POA: Diagnosis not present

## 2016-09-11 DIAGNOSIS — M5030 Other cervical disc degeneration, unspecified cervical region: Secondary | ICD-10-CM | POA: Diagnosis not present

## 2016-09-11 DIAGNOSIS — Z6838 Body mass index (BMI) 38.0-38.9, adult: Secondary | ICD-10-CM | POA: Diagnosis not present

## 2016-09-11 DIAGNOSIS — M545 Low back pain: Secondary | ICD-10-CM | POA: Diagnosis not present

## 2016-10-08 DIAGNOSIS — E890 Postprocedural hypothyroidism: Secondary | ICD-10-CM | POA: Diagnosis not present

## 2016-10-24 DIAGNOSIS — M546 Pain in thoracic spine: Secondary | ICD-10-CM | POA: Diagnosis not present

## 2016-10-24 DIAGNOSIS — Z6837 Body mass index (BMI) 37.0-37.9, adult: Secondary | ICD-10-CM | POA: Diagnosis not present

## 2016-10-24 DIAGNOSIS — M5412 Radiculopathy, cervical region: Secondary | ICD-10-CM | POA: Diagnosis not present

## 2016-10-24 DIAGNOSIS — M5030 Other cervical disc degeneration, unspecified cervical region: Secondary | ICD-10-CM | POA: Diagnosis not present

## 2016-10-24 DIAGNOSIS — M4155 Other secondary scoliosis, thoracolumbar region: Secondary | ICD-10-CM | POA: Diagnosis not present

## 2016-10-24 DIAGNOSIS — M545 Low back pain: Secondary | ICD-10-CM | POA: Diagnosis not present

## 2016-10-24 DIAGNOSIS — I10 Essential (primary) hypertension: Secondary | ICD-10-CM | POA: Diagnosis not present

## 2016-10-24 DIAGNOSIS — M4722 Other spondylosis with radiculopathy, cervical region: Secondary | ICD-10-CM | POA: Diagnosis not present

## 2016-10-30 ENCOUNTER — Ambulatory Visit: Payer: 59 | Attending: Neurosurgery | Admitting: Physical Therapy

## 2016-10-30 DIAGNOSIS — G8929 Other chronic pain: Secondary | ICD-10-CM

## 2016-10-30 DIAGNOSIS — M545 Low back pain: Secondary | ICD-10-CM | POA: Insufficient documentation

## 2016-10-30 DIAGNOSIS — M546 Pain in thoracic spine: Secondary | ICD-10-CM | POA: Diagnosis not present

## 2016-10-30 DIAGNOSIS — M6281 Muscle weakness (generalized): Secondary | ICD-10-CM | POA: Insufficient documentation

## 2016-10-30 NOTE — Therapy (Addendum)
[facility] [address] [place], [place], [address] Phone: [phone]   Fax:  [phone]  Physical Therapy Evaluation  Patient Details  Name: [name] MRN: [id number] Date of Birth: 03-20-1951 Referring Provider: Dr. Sherwood Gambler  Encounter Date: 10/30/2016      PT End of Session - 10/30/16 0951    Visit Number 1   Date for PT Re-Evaluation 12/25/16   Authorization Type Medicare G codes;  KX at visit 15   PT Start Time 0755   PT Stop Time 0845   PT Time Calculation (min) 50 min   Activity Tolerance Patient tolerated treatment well      Past Medical History:  Diagnosis Date  . Arthritis   . Breast cancer ([place])   . Bruises easily   . Cancer [facility]) 2004   rt breast;takes Arimidex daily;thyroid cancer  . History of bronchitis    when she was young  . Hypertension    takes Tribenzor daily  . Hypothyroidism    takes Synthroid daily  . Joint pain   . Joint swelling   . Personal history of chemotherapy 04/2003  . Personal history of radiation therapy 2005  . Pneumonia    hx of when she was young  . Sleep apnea    has cpap but doesn't use;sleep study done 3-71yrs ago  . Urinary frequency     Past Surgical History:  Procedure Laterality Date  . ANTERIOR CERVICAL DECOMP/DISCECTOMY FUSION  2008  . APPENDECTOMY  2007  . BREAST BIOPSY Right 02/28/2003   malignant  . BREAST LUMPECTOMY  2004  . CESAREAN SECTION  20yrs ago  . COLONOSCOPY    . COLONOSCOPY    . KNEE ARTHROSCOPY  2007  . KNEE ARTHROSCOPY  05/02/2011   Procedure: ARTHROSCOPY KNEE;  Surgeon: Johnn Hai;  Location: [facility];  Service: Orthopedics;  Laterality: Right;  . MENISCUS DEBRIDEMENT  05/02/2011   Procedure: DEBRIDEMENT OF MENISCUS;  Surgeon: Johnn Hai;  Location: [facility];  Service: Orthopedics;  Laterality: Right;  . porta cath remove  2005  . PORTACATH PLACEMENT  2004  . portacath removal     . THYROID LOBECTOMY Right 09/22/2012   Dr Constance Holster  . THYROIDECTOMY N/A 09/22/2012   Procedure: RIGHT THYROID LOBECTOMY WITH FROZEN SECTION;  Surgeon: Izora Gala, MD;  Location: [facility];  Service: ENT;  Laterality: N/A;  . THYROIDECTOMY Left 10/21/2012   Procedure: COMPLETION OF THYROIDECTOMY;  Surgeon: Izora Gala, MD;  Location: [facility];  Service: ENT;  Laterality: Left;  . TOTAL KNEE ARTHROPLASTY  04/14/2012   Procedure: TOTAL KNEE ARTHROPLASTY;  Surgeon: Ninetta Lights, MD;  Location: [facility];  Service: Orthopedics;  Laterality: Right;  RIGHT ARTHROPLASTY KNEE MEDIAL/LATERAL COMPARTMENTS WITH PATELLA RESURFACING  . TOTAL KNEE ARTHROPLASTY Left 03/30/2013   Procedure: TOTAL KNEE ARTHROPLASTY;  Surgeon: Ninetta Lights, MD;  Location: [facility];  Service: Orthopedics;  Laterality: Left;    There were no vitals filed for this visit.       Subjective Assessment - 10/30/16 0800    Subjective Last October had onset of left hand numbess and pain in arm and  lower back pain;  wears wrist brace during the day while working;   in the past 30 days right elbow pain with gripping.     Pertinent History hx of breast CA;  2 TKRs; HTN;  neck surgery 10 years ago with arm pain   Limitations Sitting;House  hold activities;Lifting   How long can you sit comfortably? 25-30 min   How long can you walk comfortably? 10-15 min   Diagnostic tests MRI;  Xray;  will do another MRI of another part of back   Patient Stated Goals Hopefully give some relief;  I can't understand why it started in my left arm, then back and now right arm   Currently in Pain? Yes   Pain Score 7    Pain Location Back   Pain Orientation Left   Pain Type Chronic pain   Pain Onset More than a month ago   Pain Frequency Intermittent   Aggravating Factors  sitting for longer periods of time;  lifting things around the house;  walking around the park too long, continuous walking   Pain Relieving Factors walking short periods   Multiple Pain Sites  Yes   Pain Score 4   Pain Location Arm   Pain Orientation Left   Pain Type Chronic pain            [facility] PT Assessment - 10/30/16 0001      Assessment   Medical Diagnosis pain in thoracic spine   Referring Provider Dr. Sherwood Gambler   Onset Date/Surgical Date --  Oct 2017   Hand Dominance Right   Next MD Visit August in Fri   Prior Therapy for TKR surgery     Precautions   Precautions None     Restrictions   Weight Bearing Restrictions No     Balance Screen   Has the patient fallen in the past 6 months No   Has the patient had a decrease in activity level because of a fear of falling?  No   Is the patient reluctant to leave their home because of a fear of falling?  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;  travelling, reading, blogger     Observation/Other Assessments   Focus on Therapeutic Outcomes (FOTO)  57% limitation     Posture/Postural Control   Posture/Postural Control Postural limitations   Postural Limitations Rounded Shoulders;Forward head;Decreased lumbar lordosis   Posture Comments right shoulder elevated, left trunk shift      AROM   Right Shoulder Flexion 170 Degrees   Right Shoulder ABduction 165 Degrees   Left Shoulder Flexion 170 Degrees   Left Shoulder ABduction 165 Degrees   Cervical Flexion 45   Cervical Extension 45   Cervical - Right Side Bend 30   Cervical - Left Side Bend 25   Cervical - Right Rotation 30   Cervical - Left Rotation 50   Lumbar Flexion 75   Lumbar Extension 20   Lumbar - Right Side Bend 20   Lumbar - Left Side Bend 25   Thoracic Extension --  stiffness   Thoracic - Right Rotation 20   Thoracic - Left Rotation 30     Strength   Strength Assessment Site --  right grip 30#,  left 25#   Right Shoulder Flexion 4/5   Right Shoulder ABduction 4/5   Right Shoulder Horizontal ABduction 4/5   Left Shoulder Flexion 4-/5   Left Shoulder ABduction 4-/5   Left Shoulder Horizontal ABduction 3+/5   Lumbar Flexion 3+/5   Lumbar  Extension 3+/5     Palpation   Palpation comment periscapular tenderness      Distraction Test   Findngs Negative     other    Comment pulling bilaterally with upper limb tension test            Objective measurements completed on examination: See above findings.                  PT Education - 10/30/16 [phone]    Education provided Yes   Education Details postural education supine, sidelying and sitting;  supine abdominal brace   Person(s) Educated Patient   Methods Explanation;Handout;Demonstration   Comprehension Verbalized understanding;Returned demonstration          PT Short Term Goals - 10/30/16 1009      PT SHORT TERM GOAL #1   Title The patient will demonstrate a good understanding of modifications for work/home to support posture (use of lumbar roll, standing periodically, pillow behind knees/between knees for sleeping   11/27/16   Time 4   Period Weeks   Status New     PT SHORT TERM GOAL #2   Title The patient will report a 30% improvement in back pain with home and work ADLs including sitting and walking   Time 4   Period Weeks   Status New     PT SHORT TERM GOAL #3   Title The patient will have knowledge and demonstrate compliance with initial HEP for core muscle activation   Time 4   Period Weeks   Status New     PT SHORT TERM GOAL #4   Title Thoracic and lumbar extension improved to 25 degrees needed for standing erect   Time 4   Period Weeks   Status New           PT Long Term Goals - 10/30/16 1117      PT LONG TERM GOAL #1   Title The patient will be independent in safe self progression of HEP needed for further improvements in ROM and strength, function and decreased pain   12/25/16   Time 8   Period Weeks   Status New     PT LONG TERM GOAL #2   Title The patient will have improved core muscle to grossly 4+/5 needed for standing and walking longer periods of time   Time 8   Period Weeks   Status New     PT LONG TERM GOAL #3   Title The patient will report a 60% improvement in pain with sitting    Time 8   Period Weeks   Status New     PT LONG TERM GOAL #4   Title Periscapular muscle strength grossly 4+/5 needed for greater ease with lifting   Time 8   Period Weeks   Status New     PT LONG TERM GOAL #5   Title FOTO functional outcome improved to 45% indicating improved function with less pain    Time 8   Period Weeks   Status New                Plan - 10/30/16 6387    Clinical Impression Statement Last October had onset of left hand numbess and pain in arm and mid to lower back pain primarily on the left but will radiate across the back.  Pain is worsening over time.   History of cervical surgery 10 years ago.  Her back pain is worsened with prolonged sitting  and lifting.  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   Better with short distance walking.  Some difficulty sleeping.  Left lateral shift in standing and elevated right shoulder.  Decreased lumbar lordosis.   Decreased lumbar ROM with extension and sidebending.  Decreased core muscle activation.  Decreased thoracic extension.  Tender points in persicapular muscles.  Left shoulder pain limits tolerance to muscle testing.  She would benefit from PT for postural education including work place modifications, core and periscapular strengthening and establishment of a HEP and modalities and manual techniques for pain control.     History and Personal Factors relevant to plan of care: cervical surgery; breast CA (right) NO U/S; bil TKR, HTN   Clinical Presentation Evolving   Clinical Presentation due to: thoracic pain but with multi region pain and symptoms as well  (left UE, right elbow, lower back pain)   Clinical Decision Making Moderate   Rehab Potential Good   Clinical Impairments Affecting Rehab Potential see personal factors above   PT Frequency 2x / week   PT Duration 8 weeks   PT Treatment/Interventions ADLs/Self Care Home Management;Electrical Stimulation;Cryotherapy;Moist Heat;Traction;Therapeutic exercise;Therapeutic activities;Neuromuscular re-education;Patient/family education;Manual techniques;Taping;Dry needling   PT Next Visit Plan Patient reports her work place is receptive to ergonomic accomodations therefore will write letter for standing desk recommendation and lumbar roll;  transverse abdominus activation progression;  scapular strengthening;  modalities as needed for pain control; review patient education on posture    Consulted and Agree with Plan of Care Patient      Patient will benefit from skilled therapeutic intervention in order to improve the following deficits and impairments:  Decreased activity tolerance, Decreased range of motion, Decreased strength, Postural dysfunction, Improper body mechanics, Pain, Impaired UE functional use, Increased muscle spasms  Visit Diagnosis: Pain in thoracic spine - Plan: PT plan of care cert/re-cert  Chronic right-sided low back pain without sciatica - Plan: PT plan of care cert/re-cert  Muscle weakness (generalized) - Plan: PT plan of care cert/re-cert  G code:  Mobility walking and moving around:  Current CK                                                                             Goal CJ   Problem List Patient Active Problem List   Diagnosis Date Noted  . DJD (degenerative joint disease) of knee 03/30/2013  . Unspecified sleep apnea 03/23/2013  . 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 Weakness 11/28/2011  . Hypertension 11/28/2011  . Hypokalemia 11/28/2011  . LUE weakness 11/28/2011  . Hypothyroidism 11/28/2011  . Breast cancer ([place]) 06/19/2011   Ruben Im, PT 10/30/16 11:25  AM Phone: [phone] Fax: [phone]  Alvera Singh 10/30/2016, 11:25 AM  [facility] [address], [place], [place], [address] Phone: [phone]   Fax:  [phone]  Name: [name] MRN: [id number] Date of Birth: 01-24-1951

## 2016-10-30 NOTE — Patient Instructions (Signed)
   A stand-up desk would be great!     Lumbar roll for desk chair   Posture Tips DO: - stand tall and erect - keep chin tucked in - keep head and shoulders in alignment - check posture regularly in mirror or large window - pull head back against headrest in car seat;  Change your position often.  Sit with lumbar support. DON'T: - slouch or slump while watching TV or reading - sit, stand or lie in one position  for too long;  Sitting is especially hard on the spine so if you sit at a desk/use the computer, then stand up often!   Copyright  VHI. All rights reserved.  Posture - Standing   Good posture is important. Avoid slouching and forward head thrust. Maintain curve in low back and align ears over shoul- ders, hips over ankles.  Pull your belly button in toward your back bone.   Copyright  VHI. All rights reserved.  Posture - Sitting   Sit upright, head facing forward. Try using a roll to support lower back. Keep shoulders relaxed, and avoid rounded back. Keep hips level with knees. Avoid crossing legs for long periods.   Copyright  VHI. All rights reserved.      Ruben Im PT [facility] [address], [place], [place] [address] Phone # [phone] Fax [phone]

## 2016-11-03 ENCOUNTER — Ambulatory Visit: Payer: 59 | Admitting: Rehabilitation

## 2016-11-03 ENCOUNTER — Encounter: Payer: Self-pay | Admitting: Rehabilitation

## 2016-11-03 DIAGNOSIS — G8929 Other chronic pain: Secondary | ICD-10-CM | POA: Diagnosis not present

## 2016-11-03 DIAGNOSIS — M545 Low back pain: Secondary | ICD-10-CM

## 2016-11-03 DIAGNOSIS — M6281 Muscle weakness (generalized): Secondary | ICD-10-CM

## 2016-11-03 DIAGNOSIS — M546 Pain in thoracic spine: Secondary | ICD-10-CM

## 2016-11-03 NOTE — Therapy (Signed)
[facility] [address], [place], [place], [address] Phone: [phone]   Fax:  [phone]  Physical Therapy Treatment  Patient Details  Name: [name] MRN: [id number] Date of Birth: [birth year] Referring Provider: Dr. Sherwood Gambler  Encounter Date: 11/03/2016      PT End of Session - 11/03/16 0824    Visit Number 2   Date for PT Re-Evaluation 12/25/16   PT Start Time 0800   PT Stop Time 0858   PT Time Calculation (min) 58 min   Activity Tolerance Patient tolerated treatment well      Past Medical History:  Diagnosis Date  . Arthritis   . Breast cancer ([place])   . Bruises easily   . Cancer [facility]) 2004   rt breast;takes Arimidex daily;thyroid cancer  . History of bronchitis    when she was young  . Hypertension    takes Tribenzor daily  . Hypothyroidism    takes Synthroid daily  . Joint pain   . Joint swelling   . Personal history of chemotherapy 04/2003  . Personal history of radiation therapy 2005  . Pneumonia    hx of when she was young  . Sleep apnea    has cpap but doesn't use;sleep study done 3-39yrs ago  . Urinary frequency     Past Surgical History:  Procedure Laterality Date  . ANTERIOR CERVICAL DECOMP/DISCECTOMY FUSION  2008  . APPENDECTOMY  2007  . BREAST BIOPSY Right 02/28/2003   malignant  . BREAST LUMPECTOMY  2004  . CESAREAN SECTION  59yrs ago  . COLONOSCOPY    . COLONOSCOPY    . KNEE ARTHROSCOPY  2007  . KNEE ARTHROSCOPY  05/02/2011   Procedure: ARTHROSCOPY KNEE;  Surgeon: Johnn Hai;  Location: [place];  Service: Orthopedics;  Laterality: Right;  . MENISCUS DEBRIDEMENT  05/02/2011   Procedure: DEBRIDEMENT OF MENISCUS;  Surgeon: Johnn Hai;  Location: [place];  Service: Orthopedics;  Laterality: Right;  . porta cath remove  2005  . PORTACATH PLACEMENT  2004  . portacath removal    . THYROID LOBECTOMY Right 09/22/2012   Dr Constance Holster  .  THYROIDECTOMY N/A 09/22/2012   Procedure: RIGHT THYROID LOBECTOMY WITH FROZEN SECTION;  Surgeon: Izora Gala, MD;  Location: [place];  Service: ENT;  Laterality: N/A;  . THYROIDECTOMY Left 10/21/2012   Procedure: COMPLETION OF THYROIDECTOMY;  Surgeon: Izora Gala, MD;  Location: [place];  Service: ENT;  Laterality: Left;  . TOTAL KNEE ARTHROPLASTY  04/14/2012   Procedure: TOTAL KNEE ARTHROPLASTY;  Surgeon: Ninetta Lights, MD;  Location: [place];  Service: Orthopedics;  Laterality: Right;  RIGHT ARTHROPLASTY KNEE MEDIAL/LATERAL COMPARTMENTS WITH PATELLA RESURFACING  . TOTAL KNEE ARTHROPLASTY Left 03/30/2013   Procedure: TOTAL KNEE ARTHROPLASTY;  Surgeon: Ninetta Lights, MD;  Location: [place];  Service: Orthopedics;  Laterality: Left;    There were no vitals filed for this visit.      Subjective Assessment - 11/03/16 0758    Subjective Yesterday was really bad.  Today and yesterday achy in the low back.  Pt bringing in paperwork for desk modifications.     Currently in Pain? Yes   Pain Score 7    Pain Location Back   Pain Orientation Mid   Pain Descriptors / Indicators Aching                         [facility]  PT Treatment/Exercise - 11/03/16 0001      Transfers   Comments log roll education     Exercises   Exercises Lumbar;Neck     Neck Exercises: Standing   Other Standing Exercises scapular retraction red x 20     Lumbar Exercises: Stretches   Passive Hamstring Stretch 3 reps;20 seconds   Passive Hamstring Stretch Limitations bil with strap   Single Knee to Chest Stretch 2 reps;20 seconds   Single Knee to Chest Stretch Limitations bil   Lower Trunk Rotation 5 reps;10 seconds     Modalities   Modalities Electrical Stimulation;Moist Heat     Moist Heat Therapy   Number Minutes Moist Heat 12 Minutes   Moist Heat Location Cervical  and lumbar     Electrical Stimulation   Electrical Stimulation Location lumbar   Electrical Stimulation Action IFC   Electrical  Stimulation Parameters to tolerance   Electrical Stimulation Goals Pain     Neck Exercises: Stretches   Upper Trapezius Stretch 2 reps;30 seconds   Upper Trapezius Stretch Limitations L                  PT Short Term Goals - 10/30/16 1009      PT SHORT TERM GOAL #1   Title The patient will demonstrate a good understanding of modifications for work/home to support posture (use of lumbar roll, standing periodically, pillow behind knees/between knees for sleeping   11/27/16   Time 4   Period Weeks   Status New     PT SHORT TERM GOAL #2   Title The patient will report a 30% improvement in back pain with home and work ADLs including sitting and walking   Time 4   Period Weeks   Status New     PT SHORT TERM GOAL #3   Title The patient will have knowledge and demonstrate compliance with initial HEP for core muscle activation   Time 4   Period Weeks   Status New     PT SHORT TERM GOAL #4   Title Thoracic and lumbar extension improved to 25 degrees needed for standing erect   Time 4   Period Weeks   Status New           PT Long Term Goals - 10/30/16 1117      PT LONG TERM GOAL #1   Title The patient will be independent in safe self progression of HEP needed for further improvements in ROM and strength, function and decreased pain  12/25/16   Time 8   Period Weeks   Status New     PT LONG TERM GOAL #2   Title The patient will have improved core muscle to grossly 4+/5 needed for standing and walking longer periods of time   Time 8   Period Weeks   Status New     PT LONG TERM GOAL #3   Title The patient will report a 60% improvement in pain with sitting    Time 8   Period Weeks   Status New     PT LONG TERM GOAL #4   Title Periscapular muscle strength grossly 4+/5 needed for greater ease with lifting   Time 8   Period Weeks   Status New     PT LONG TERM GOAL #5   Title FOTO functional outcome improved to 45% indicating improved function with less pain     Time 8   Period Weeks   Status New  Plan - 11/03/16 0824    Clinical Impression Statement Began low back stretching and stretches for L UT/UE today;  unable to give HEP due to no printer.  will need for next visit.  very tender L UT with some symptoms into the UE.     PT Next Visit Plan give workplace forms, TA activation, scapular strength, modalities as needed, posture      Patient will benefit from skilled therapeutic intervention in order to improve the following deficits and impairments:     Visit Diagnosis: Pain in thoracic spine  Chronic right-sided low back pain without sciatica  Muscle weakness (generalized)     Problem List Patient Active Problem List   Diagnosis Date Noted  . DJD (degenerative joint disease) of knee 03/30/2013  . Unspecified sleep apnea 03/23/2013  . 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 Weakness 11/28/2011  . Hypertension 11/28/2011  . Hypokalemia 11/28/2011  . LUE weakness 11/28/2011  . Hypothyroidism 11/28/2011  . Breast cancer ([place]) 06/19/2011    Stark Bray, DPT, CMP 11/03/2016, 8:44 AM  [facility] [address], [place], [place], [address] Phone: [phone]   Fax:  [phone]  Name: DEBBORA ANG MRN: [id number] Date of Birth: 1950-07-25

## 2016-11-05 DIAGNOSIS — M25521 Pain in right elbow: Secondary | ICD-10-CM | POA: Diagnosis not present

## 2016-11-07 ENCOUNTER — Ambulatory Visit: Payer: 59 | Admitting: Physical Therapy

## 2016-11-07 DIAGNOSIS — M6281 Muscle weakness (generalized): Secondary | ICD-10-CM | POA: Diagnosis not present

## 2016-11-07 DIAGNOSIS — M546 Pain in thoracic spine: Secondary | ICD-10-CM | POA: Diagnosis not present

## 2016-11-07 DIAGNOSIS — M545 Low back pain: Principal | ICD-10-CM

## 2016-11-07 DIAGNOSIS — G8929 Other chronic pain: Secondary | ICD-10-CM | POA: Diagnosis not present

## 2016-11-07 NOTE — Patient Instructions (Signed)
  RE-ALIGNMENT ROUTINE EXERCISES BASIC FOR POSTURAL CORRECTION   RE-ALIGNMENT Tips BENEFITS: 1.It helps to re-align the curves of the back and improve standing posture. 2.It allows the back muscles to rest and strengthen in preparation for more activity. FREQUENCY: Daily, even after weeks, months and years of more advanced exercises. START: 1.All exercises start in the same position: lying on the back, arms resting on the supporting surface, palms up and slightly away from the body, backs of hands down, knees bent, feet flat. 2.The head, neck, arms, and legs are supported according to specific instructions of your therapist. Copyright  VHI. All rights reserved.    1. Decompression Exercise: Basic.   Takes compression off the vertebral bodies; increases tolerance for lying on the back; helps relieve back pain   Lie on back on firm surface, knees bent, feet flat, arms turned up, out to sides (~35 degrees). Head neck and arms supported as necessary.  Take some deep breaths.  In through the nose out through the mouth.  Time _5-15__ minutes. Surface: floor     2. Shoulder Press  Strengthens upper back extensors and scapular retractors.   Press both shoulders down. Hold _2-3__ seconds. Repeat _3-5__ times. Surface: floor        3. Head Press With [place]  Strengthens neck extensors   Tuck chin SLIGHTLY toward chest, keep mouth closed. Feel weight on back of head. Increase weight by pressing head down. Hold _2-3__ seconds. Relax. Repeat 3-5___ times. Surface: floor   4. Leg Lengthener: Think "growing that leg longer"  5x 5 sec hold each leg  5. Whole leg press down.  Think "pressing leg down into the sand."  5x 5 sec hold  Ruben Im PT [facility] [address], [place], [place] [address] Phone # [phone] Fax [phone]

## 2016-11-07 NOTE — Therapy (Signed)
[facility] [address], [place], [place], [address] Phone: [phone]   Fax:  [phone]  Physical Therapy Treatment  Patient Details  Name: [name] MRN: [id number] Date of Birth: January 13, 1951 Referring Provider: Dr. Sherwood Gambler  Encounter Date: 11/07/2016      PT End of Session - 11/07/16 0826    Visit Number 3   Date for PT Re-Evaluation 12/25/16   Authorization Type Medicare G codes;  KX at visit 15   PT Start Time 0800   PT Stop Time 0900   PT Time Calculation (min) 60 min   Activity Tolerance Patient tolerated treatment well      Past Medical History:  Diagnosis Date  . Arthritis   . Breast cancer ([place])   . Bruises easily   . Cancer [facility]) 2004   rt breast;takes Arimidex daily;thyroid cancer  . History of bronchitis    when she was young  . Hypertension    takes Tribenzor daily  . Hypothyroidism    takes Synthroid daily  . Joint pain   . Joint swelling   . Personal history of chemotherapy 04/2003  . Personal history of radiation therapy 2005  . Pneumonia    hx of when she was young  . Sleep apnea    has cpap but doesn't use;sleep study done 3-102yrs ago  . Urinary frequency     Past Surgical History:  Procedure Laterality Date  . ANTERIOR CERVICAL DECOMP/DISCECTOMY FUSION  2008  . APPENDECTOMY  2007  . BREAST BIOPSY Right 02/28/2003   malignant  . BREAST LUMPECTOMY  2004  . CESAREAN SECTION  59yrs ago  . COLONOSCOPY    . COLONOSCOPY    . KNEE ARTHROSCOPY  2007  . KNEE ARTHROSCOPY  05/02/2011   Procedure: ARTHROSCOPY KNEE;  Surgeon: Johnn Hai;  Location: [place];  Service: Orthopedics;  Laterality: Right;  . MENISCUS DEBRIDEMENT  05/02/2011   Procedure: DEBRIDEMENT OF MENISCUS;  Surgeon: Johnn Hai;  Location: [place];  Service: Orthopedics;  Laterality: Right;  . porta cath remove  2005  . PORTACATH PLACEMENT  2004  . portacath removal     . THYROID LOBECTOMY Right 09/22/2012   Dr Constance Holster  . THYROIDECTOMY N/A 09/22/2012   Procedure: RIGHT THYROID LOBECTOMY WITH FROZEN SECTION;  Surgeon: Izora Gala, MD;  Location: [place];  Service: ENT;  Laterality: N/A;  . THYROIDECTOMY Left 10/21/2012   Procedure: COMPLETION OF THYROIDECTOMY;  Surgeon: Izora Gala, MD;  Location: [place];  Service: ENT;  Laterality: Left;  . TOTAL KNEE ARTHROPLASTY  04/14/2012   Procedure: TOTAL KNEE ARTHROPLASTY;  Surgeon: Ninetta Lights, MD;  Location: [place];  Service: Orthopedics;  Laterality: Right;  RIGHT ARTHROPLASTY KNEE MEDIAL/LATERAL COMPARTMENTS WITH PATELLA RESURFACING  . TOTAL KNEE ARTHROPLASTY Left 03/30/2013   Procedure: TOTAL KNEE ARTHROPLASTY;  Surgeon: Ninetta Lights, MD;  Location: [place];  Service: Orthopedics;  Laterality: Left;    There were no vitals filed for this visit.      Subjective Assessment - 11/07/16 0757    Subjective Patient has new order for right medial epicondylitis.  Will hold order until after current treatment plan of care.  Further discussion on work Music therapist.  Patient will take paperwork and fax herself.   I felt so good after last visit.  Liked the TENS unit and heat.     Pertinent History hx of breast CA;  2 TKRs; HTN;  neck surgery  10 years ago with arm pain   Currently in Pain? Yes   Pain Score 2    Pain Location Back   Pain Orientation Left   Pain Type Chronic pain   Aggravating Factors  prolonged sitting                         [facility] Adult PT Treatment/Exercise - 11/07/16 0001      Self-Care   Self-Care Posture;Other Self-Care Comments   Other Self-Care Comments  use of bodypillow for sidesleeping;  about home TENs; discussion about acupuncture vs. 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     Neuro Re-ed    Neuro Re-ed Details  abdominal brace series     Lumbar Exercises: Supine   Ab Set 10 reps    Other Supine Lumbar Exercises decompression series per HEP     Moist Heat Therapy   Number Minutes Moist Heat 13 Minutes   Moist Heat Location Cervical  and lumbar     Electrical Stimulation   Electrical Stimulation Location lumbar   Electrical Stimulation Action IFC   Electrical Stimulation Parameters to tolerance 13 min   Electrical Stimulation Goals Pain                PT Education - 11/07/16 0825    Education provided Yes   Education Details decompression exercises   Person(s) Educated Patient   Methods Explanation;Demonstration;Handout   Comprehension Verbalized understanding;Returned demonstration          PT Short Term Goals - 11/07/16 1225      PT SHORT TERM GOAL #1   Title The patient will demonstrate a good understanding of modifications for work/home to support posture (use of lumbar roll, standing periodically, pillow behind knees/between knees for sleeping   11/27/16   Time 4   Period Weeks   Status On-going     PT SHORT TERM GOAL #2   Title The patient will report a 30% improvement in back pain with home and work ADLs including sitting and walking   Time 4   Period Weeks   Status On-going     PT SHORT TERM GOAL #3   Title The patient will have knowledge and demonstrate compliance with initial HEP for core muscle activation   Time 4   Period Weeks   Status On-going     PT SHORT TERM GOAL #4   Title Thoracic and lumbar extension improved to 25 degrees needed for standing erect   Time 4   Period Weeks   Status On-going           PT Long Term Goals - 11/07/16 1225      PT LONG TERM GOAL #1   Title The patient will be independent in safe self progression of HEP needed for further improvements in ROM and strength, function and decreased pain  12/25/16   Time 8   Period Weeks   Status On-going     PT LONG TERM GOAL #2   Title The patient will have improved core muscle to grossly 4+/5 needed for standing and walking longer periods of time    Time 8   Period Weeks   Status On-going     PT LONG TERM GOAL #3   Title The patient will report a 60% improvement in pain with sitting    Time 8   Period Weeks  Status On-going     PT LONG TERM GOAL #4   Title Periscapular muscle strength grossly 4+/5 needed for greater ease with lifting   Time 8   Period Weeks   Status On-going     PT LONG TERM GOAL #5   Title FOTO functional outcome improved to 45% indicating improved function with less pain    Time 8   Period Weeks   Status On-going               Plan - 11/07/16 1220    Clinical Impression Statement The patient will submit work place recommendations today.  She is able to participate in low level decompression ex's for postural alignment without pain exacerbation.  She may be a candidate for DN (patient give basic info) and she will consider.  Good pain relief with electrical stimulation and heat.     Clinical Impairments Affecting Rehab Potential bil UE pain   PT Frequency 2x / week   PT Duration 8 weeks   PT Treatment/Interventions ADLs/Self Care Home Management;Electrical Stimulation;Cryotherapy;Moist Heat;Traction;Therapeutic exercise;Therapeutic activities;Neuromuscular re-education;Patient/family education;Manual techniques;Taping;Dry needling   PT Next Visit Plan TA activation and progression;   scapular strength, modalities as needed, posture;  dry needling periscapular muscles, paraspinals   Recommended Other Services PT order for right medial epicondylitis on hold (filed) until after current plan of care completed      Patient will benefit from skilled therapeutic intervention in order to improve the following deficits and impairments:  Decreased activity tolerance, Decreased range of motion, Decreased strength, Postural dysfunction, Improper body mechanics, Pain, Impaired UE functional use, Increased muscle spasms  Visit Diagnosis: Chronic right-sided low back pain without sciatica  Pain in thoracic  spine  Muscle weakness (generalized)     Problem List Patient Active Problem List   Diagnosis Date Noted  . DJD (degenerative joint disease) of knee 03/30/2013  . Unspecified sleep apnea 03/23/2013  . 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 Weakness 11/28/2011  . Hypertension 11/28/2011  . Hypokalemia 11/28/2011  . LUE weakness 11/28/2011  . Hypothyroidism 11/28/2011  . Breast cancer ([place]) 06/19/2011   Ruben Im, PT 11/07/16 12:30 PM Phone: [phone] Fax: [phone]  Alvera Singh 11/07/2016, 12:29 PM  [facility] [address], [place], [place], [address] Phone: [phone]   Fax:  [phone]  Name: MARTIZA SPETH MRN: [id number] Date of Birth: December 30, 1950

## 2016-11-11 ENCOUNTER — Ambulatory Visit: Payer: 59 | Admitting: Physical Therapy

## 2016-11-11 DIAGNOSIS — M6281 Muscle weakness (generalized): Secondary | ICD-10-CM | POA: Diagnosis not present

## 2016-11-11 DIAGNOSIS — G8929 Other chronic pain: Secondary | ICD-10-CM | POA: Diagnosis not present

## 2016-11-11 DIAGNOSIS — M546 Pain in thoracic spine: Secondary | ICD-10-CM

## 2016-11-11 DIAGNOSIS — M545 Low back pain: Principal | ICD-10-CM

## 2016-11-11 NOTE — Patient Instructions (Signed)
     Trigger Point Dry Needling  . What is Trigger Point Dry Needling (DN)? o DN is a physical therapy technique used to treat muscle pain and dysfunction. Specifically, DN helps deactivate muscle trigger points (muscle knots).  o A thin filiform needle is used to penetrate the skin and stimulate the underlying trigger point. The goal is for a local twitch response (LTR) to occur and for the trigger point to relax. No medication of any kind is injected during the procedure.   . What Does Trigger Point Dry Needling Feel Like?  o The procedure feels different for each individual patient. Some patients report that they do not actually feel the needle enter the skin and overall the process is not painful. Very mild bleeding may occur. However, many patients feel a deep cramping in the muscle in which the needle was inserted. This is the local twitch response.   . How Will I feel after the treatment? o Soreness is normal, and the onset of soreness may not occur for a few hours. Typically this soreness does not last longer than two days.  o Bruising is uncommon, however; ice can be used to decrease any possible bruising.  o In rare cases feeling tired or nauseous after the treatment is normal. In addition, your symptoms may get worse before they get better, this period will typically not last longer than 24 hours.   . What Can I do After My Treatment? o Increase your hydration by drinking more water for the next 24 hours. o You may place ice or heat on the areas treated that have become sore, however, do not use heat on inflamed or bruised areas. Heat often brings more relief post needling. o You can continue your regular activities, but vigorous activity is not recommended initially after the treatment for 24 hours. o DN is best combined with other physical therapy such as strengthening, stretching, and other therapies.    Stacy Simpson PT [facility] [address], [address] [place], [place] [address] Phone # [phone] Fax [phone] 

## 2016-11-11 NOTE — Therapy (Signed)
[facility] [address] [place], [place], [address] Phone: [phone]   Fax:  [phone]  Physical Therapy Treatment  Patient Details  Name: [name] MRN: [id number] Date of Birth: June 10, 1950 Referring Provider: Dr. Sherwood Gambler  Encounter Date: 11/11/2016      PT End of Session - 11/11/16 1713    Visit Number 4   Date for PT Re-Evaluation 12/25/16   Authorization Type Medicare G codes;  KX at visit 15   PT Start Time 0803   PT Stop Time 0850   PT Time Calculation (min) 47 min   Activity Tolerance Patient tolerated treatment well      Past Medical History:  Diagnosis Date  . Arthritis   . Breast cancer ([place])   . Bruises easily   . Cancer [facility]) 2004   rt breast;takes Arimidex daily;thyroid cancer  . History of bronchitis    when she was young  . Hypertension    takes Tribenzor daily  . Hypothyroidism    takes Synthroid daily  . Joint pain   . Joint swelling   . Personal history of chemotherapy 04/2003  . Personal history of radiation therapy 2005  . Pneumonia    hx of when she was young  . Sleep apnea    has cpap but doesn't use;sleep study done 3-44yrs ago  . Urinary frequency     Past Surgical History:  Procedure Laterality Date  . ANTERIOR CERVICAL DECOMP/DISCECTOMY FUSION  2008  . APPENDECTOMY  2007  . BREAST BIOPSY Right 02/28/2003   malignant  . BREAST LUMPECTOMY  2004  . CESAREAN SECTION  53yrs ago  . COLONOSCOPY    . COLONOSCOPY    . KNEE ARTHROSCOPY  2007  . KNEE ARTHROSCOPY  05/02/2011   Procedure: ARTHROSCOPY KNEE;  Surgeon: Johnn Hai;  Location: [place];  Service: Orthopedics;  Laterality: Right;  . MENISCUS DEBRIDEMENT  05/02/2011   Procedure: DEBRIDEMENT OF MENISCUS;  Surgeon: Johnn Hai;  Location: [place];  Service: Orthopedics;  Laterality: Right;  . porta cath remove  2005  . PORTACATH PLACEMENT  2004  . portacath removal     . THYROID LOBECTOMY Right 09/22/2012   Dr Constance Holster  . THYROIDECTOMY N/A 09/22/2012   Procedure: RIGHT THYROID LOBECTOMY WITH FROZEN SECTION;  Surgeon: Izora Gala, MD;  Location: [place];  Service: ENT;  Laterality: N/A;  . THYROIDECTOMY Left 10/21/2012   Procedure: COMPLETION OF THYROIDECTOMY;  Surgeon: Izora Gala, MD;  Location: [place];  Service: ENT;  Laterality: Left;  . TOTAL KNEE ARTHROPLASTY  04/14/2012   Procedure: TOTAL KNEE ARTHROPLASTY;  Surgeon: Ninetta Lights, MD;  Location: [place];  Service: Orthopedics;  Laterality: Right;  RIGHT ARTHROPLASTY KNEE MEDIAL/LATERAL COMPARTMENTS WITH PATELLA RESURFACING  . TOTAL KNEE ARTHROPLASTY Left 03/30/2013   Procedure: TOTAL KNEE ARTHROPLASTY;  Surgeon: Ninetta Lights, MD;  Location: [place];  Service: Orthopedics;  Laterality: Left;    There were no vitals filed for this visit.      Subjective Assessment - 11/11/16 0805    Subjective Today is kind of strange.  My legt hand and elbow is bothering me and in low back too.  Aching while driving last night.  Left low back , right upper trap.  Decompression exercises did OK.  Ordered a TENS unit from [employer] but hasn't received it yet.   She is interested in DN.     Currently in Pain? Yes  Pain Score 7    Pain Location Back   Pain Orientation Left   Pain Type Chronic pain                         [facility] Adult PT Treatment/Exercise - 11/11/16 0001      Neck Exercises: Supine   Neck Retraction 10 reps   Other Supine Exercise shoulder extension isometric 10x     Lumbar Exercises: Stretches   Single Knee to Chest Stretch 2 reps;20 seconds   Lower Trunk Rotation 5 reps;10 seconds     Lumbar Exercises: Supine   Ab Set 10 reps     Moist Heat Therapy   Number Minutes Moist Heat 15 Minutes   Moist Heat Location Cervical  and lumbar     Electrical Stimulation   Electrical Stimulation Location lumbar   Electrical Stimulation Action IFC   Electrical Stimulation Parameters to  tolerance   Electrical Stimulation Goals Pain     Manual Therapy   Manual Therapy Soft tissue mobilization   Joint Mobilization grade pelvic distraction and neutral gapping 3x 20 sec each   Soft tissue mobilization bilateral lumbar paraspinals, gluteals, piriformis and quadratus lumborum          Trigger Point Dry Needling - 11/11/16 1712    Consent Given? Yes   Education Handout Provided Yes   Muscles Treated Lower Body Gluteus minimus;Gluteus maximus;Piriformis  lumbar multifidi   Gluteus Maximus Response Twitch response elicited;Palpable increased muscle length   Gluteus Minimus Response Twitch response elicited;Palpable increased muscle length   Piriformis Response Twitch response elicited;Palpable increased muscle length              PT Education - 11/11/16 0838    Education provided Yes   Education Details dry needling aftercare   Person(s) Educated Patient   Methods Explanation;Demonstration;Handout   Comprehension Verbalized understanding;Returned demonstration          PT Short Term Goals - 11/11/16 1717      PT SHORT TERM GOAL #1   Title The patient will demonstrate a good understanding of modifications for work/home to support posture (use of lumbar roll, standing periodically, pillow behind knees/between knees for sleeping   11/27/16   Status Achieved     PT SHORT TERM GOAL #2   Title The patient will report a 30% improvement in back pain with home and work ADLs including sitting and walking   Time 4   Period Weeks   Status On-going     PT SHORT TERM GOAL #3   Title The patient will have knowledge and demonstrate compliance with initial HEP for core muscle activation   Time 4   Period Weeks   Status On-going     PT SHORT TERM GOAL #4   Title Thoracic and lumbar extension improved to 25 degrees needed for standing erect   Time 4   Period Weeks   Status On-going           PT Long Term Goals - 11/11/16 1718      PT LONG TERM GOAL #1    Title The patient will be independent in safe self progression of HEP needed for further improvements in ROM and strength, function and decreased pain  12/25/16   Time 8   Period Weeks   Status On-going     PT LONG TERM GOAL #2   Title The patient will have improved core muscle to grossly 4+/5 needed for standing and  walking longer periods of time   Time 8   Period Weeks   Status On-going     PT LONG TERM GOAL #3   Title The patient will report a 60% improvement in pain with sitting    Time 8   Period Weeks   Status On-going     PT LONG TERM GOAL #4   Title Periscapular muscle strength grossly 4+/5 needed for greater ease with lifting   Time 8   Period Weeks   Status On-going     PT LONG TERM GOAL #5   Title FOTO functional outcome improved to 45% indicating improved function with less pain    Time 8   Period Weeks   Status On-going               Plan - 11/11/16 1713    Clinical Impression Statement The patient demonstrates good compliance with initial HEP.  She has multiple tender points in lumbar paraspinals, gluteals and piriformis and is receptive to try dry needling.  Improved muscle length following DN and manual interventions.   She may benefit from additional DN to quadratus lumborum muscle.     Rehab Potential Good   PT Frequency 2x / week   PT Duration 8 weeks   PT Treatment/Interventions ADLs/Self Care Home Management;Electrical Stimulation;Cryotherapy;Moist Heat;Traction;Therapeutic exercise;Therapeutic activities;Neuromuscular re-education;Patient/family education;Manual techniques;Taping;Dry needling   PT Next Visit Plan assess response to initial DN and add quadratus and periscapular muscles as needed;  postural strengthening      Patient will benefit from skilled therapeutic intervention in order to improve the following deficits and impairments:  Decreased activity tolerance, Decreased range of motion, Decreased strength, Postural dysfunction, Improper  body mechanics, Pain, Impaired UE functional use, Increased muscle spasms  Visit Diagnosis: Chronic right-sided low back pain without sciatica  Pain in thoracic spine  Muscle weakness (generalized)     Problem List Patient Active Problem List   Diagnosis Date Noted  . DJD (degenerative joint disease) of knee 03/30/2013  . Unspecified sleep apnea 03/23/2013  . 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 Weakness 11/28/2011  . Hypertension 11/28/2011  . Hypokalemia 11/28/2011  . LUE weakness 11/28/2011  . Hypothyroidism 11/28/2011  . Breast cancer ([place]) 06/19/2011   Ruben Im, PT 11/11/16 5:20 PM Phone: [phone] Fax: [phone]  Alvera Singh 11/11/2016, 5:19 PM  [facility] [address] [place], [place], [address] Phone: [phone]   Fax:  [phone]  Name: [name] MRN: [id number] Date of Birth: 1950-07-10

## 2016-11-14 ENCOUNTER — Encounter: Payer: Self-pay | Admitting: Rehabilitation

## 2016-11-14 ENCOUNTER — Ambulatory Visit: Payer: 59 | Admitting: Rehabilitation

## 2016-11-14 DIAGNOSIS — M546 Pain in thoracic spine: Secondary | ICD-10-CM

## 2016-11-14 DIAGNOSIS — M545 Low back pain: Secondary | ICD-10-CM | POA: Diagnosis not present

## 2016-11-14 DIAGNOSIS — G8929 Other chronic pain: Secondary | ICD-10-CM | POA: Diagnosis not present

## 2016-11-14 DIAGNOSIS — M6281 Muscle weakness (generalized): Secondary | ICD-10-CM | POA: Diagnosis not present

## 2016-11-14 NOTE — Therapy (Signed)
[facility] [address], [place], [place], [address] Phone: [phone]   Fax:  [phone]  Physical Therapy Treatment  Patient Details  Name: [name] MRN: [id number] Date of Birth: [birth year] Referring Provider: Dr. Sherwood Gambler  Encounter Date: 11/14/2016      PT End of Session - 11/14/16 0847    Visit Number 5   Date for PT Re-Evaluation 12/25/16   Authorization Type Medicare G codes;  KX at visit 15   PT Start Time 0800   PT Stop Time 0900   PT Time Calculation (min) 60 min   Activity Tolerance Patient tolerated treatment well      Past Medical History:  Diagnosis Date  . Arthritis   . Breast cancer ([place])   . Bruises easily   . Cancer [facility]) 2004   rt breast;takes Arimidex daily;thyroid cancer  . History of bronchitis    when she was young  . Hypertension    takes Tribenzor daily  . Hypothyroidism    takes Synthroid daily  . Joint pain   . Joint swelling   . Personal history of chemotherapy 04/2003  . Personal history of radiation therapy 2005  . Pneumonia    hx of when she was young  . Sleep apnea    has cpap but doesn't use;sleep study done 3-22yrs ago  . Urinary frequency     Past Surgical History:  Procedure Laterality Date  . ANTERIOR CERVICAL DECOMP/DISCECTOMY FUSION  2008  . APPENDECTOMY  2007  . BREAST BIOPSY Right 02/28/2003   malignant  . BREAST LUMPECTOMY  2004  . CESAREAN SECTION  24yrs ago  . COLONOSCOPY    . COLONOSCOPY    . KNEE ARTHROSCOPY  2007  . KNEE ARTHROSCOPY  05/02/2011   Procedure: ARTHROSCOPY KNEE;  Surgeon: Johnn Hai;  Location: [place];  Service: Orthopedics;  Laterality: Right;  . MENISCUS DEBRIDEMENT  05/02/2011   Procedure: DEBRIDEMENT OF MENISCUS;  Surgeon: Johnn Hai;  Location: [place];  Service: Orthopedics;  Laterality: Right;  . porta cath remove  2005  . PORTACATH PLACEMENT  2004  . portacath removal     . THYROID LOBECTOMY Right 09/22/2012   Dr Constance Holster  . THYROIDECTOMY N/A 09/22/2012   Procedure: RIGHT THYROID LOBECTOMY WITH FROZEN SECTION;  Surgeon: Izora Gala, MD;  Location: [place];  Service: ENT;  Laterality: N/A;  . THYROIDECTOMY Left 10/21/2012   Procedure: COMPLETION OF THYROIDECTOMY;  Surgeon: Izora Gala, MD;  Location: [place];  Service: ENT;  Laterality: Left;  . TOTAL KNEE ARTHROPLASTY  04/14/2012   Procedure: TOTAL KNEE ARTHROPLASTY;  Surgeon: Ninetta Lights, MD;  Location: [place];  Service: Orthopedics;  Laterality: Right;  RIGHT ARTHROPLASTY KNEE MEDIAL/LATERAL COMPARTMENTS WITH PATELLA RESURFACING  . TOTAL KNEE ARTHROPLASTY Left 03/30/2013   Procedure: TOTAL KNEE ARTHROPLASTY;  Surgeon: Ninetta Lights, MD;  Location: [place];  Service: Orthopedics;  Laterality: Left;    There were no vitals filed for this visit.      Subjective Assessment - 11/14/16 0758    Subjective Liked the needling.  Did not feel as bad in the morning and when driving.     Pain Score 5    Pain Location Back   Pain Orientation Left   Pain Descriptors / Indicators Aching                         [facility] Adult  PT Treatment/Exercise - 11/14/16 0001      Lumbar Exercises: Aerobic   Stationary Bike Nustep level 2 UE/LE seat 9 x 9min     Lumbar Exercises: Supine   Ab Set 5 reps  10sec hold   Bridge 5 reps   Other Supine Lumbar Exercises TrA with adductor squeeze 10"x5, UE ext press into mat 10"x5, TrA with red band bil horizontal abd x 10     Moist Heat Therapy   Number Minutes Moist Heat 15 Minutes   Moist Heat Location Cervical;Lumbar Spine     Electrical Stimulation   Electrical Stimulation Location lumbar   Electrical Stimulation Action IFC   Electrical Stimulation Parameters to tolerance in supine hooklying   Electrical Stimulation Goals Pain     Manual Therapy   Joint Mobilization grade pelvic distraction and neutral gapping 3x 20 sec each   Soft tissue mobilization  bilateral lumbar paraspinals, gluteals, piriformis and quadratus lumborum                  PT Short Term Goals - 11/11/16 1717      PT SHORT TERM GOAL #1   Title The patient will demonstrate a good understanding of modifications for work/home to support posture (use of lumbar roll, standing periodically, pillow behind knees/between knees for sleeping   11/27/16   Status Achieved     PT SHORT TERM GOAL #2   Title The patient will report a 30% improvement in back pain with home and work ADLs including sitting and walking   Time 4   Period Weeks   Status On-going     PT SHORT TERM GOAL #3   Title The patient will have knowledge and demonstrate compliance with initial HEP for core muscle activation   Time 4   Period Weeks   Status On-going     PT SHORT TERM GOAL #4   Title Thoracic and lumbar extension improved to 25 degrees needed for standing erect   Time 4   Period Weeks   Status On-going           PT Long Term Goals - 11/11/16 1718      PT LONG TERM GOAL #1   Title The patient will be independent in safe self progression of HEP needed for further improvements in ROM and strength, function and decreased pain  12/25/16   Time 8   Period Weeks   Status On-going     PT LONG TERM GOAL #2   Title The patient will have improved core muscle to grossly 4+/5 needed for standing and walking longer periods of time   Time 8   Period Weeks   Status On-going     PT LONG TERM GOAL #3   Title The patient will report a 60% improvement in pain with sitting    Time 8   Period Weeks   Status On-going     PT LONG TERM GOAL #4   Title Periscapular muscle strength grossly 4+/5 needed for greater ease with lifting   Time 8   Period Weeks   Status On-going     PT LONG TERM GOAL #5   Title FOTO functional outcome improved to 45% indicating improved function with less pain    Time 8   Period Weeks   Status On-going               Plan - 11/14/16 0847    Clinical  Impression Statement Pt starting to feel much better with  STM, dry needling, and HEP.  She has decreasing pain levels. Continued with ttp and tender points in the R QL, paraspinals, and piriformis today, present but not remarkable left.     PT Treatment/Interventions ADLs/Self Care Home Management;Electrical Stimulation;Cryotherapy;Moist Heat;Traction;Therapeutic exercise;Therapeutic activities;Neuromuscular re-education;Patient/family education;Manual techniques;Taping;Dry needling   PT Next Visit Plan continue DN, STM, and [place]x      Patient will benefit from skilled therapeutic intervention in order to improve the following deficits and impairments:  Decreased activity tolerance, Decreased range of motion, Decreased strength, Postural dysfunction, Improper body mechanics, Pain, Impaired UE functional use, Increased muscle spasms  Visit Diagnosis: Chronic right-sided low back pain without sciatica  Muscle weakness (generalized)  Pain in thoracic spine     Problem List Patient Active Problem List   Diagnosis Date Noted  . DJD (degenerative joint disease) of knee 03/30/2013  . Unspecified sleep apnea 03/23/2013  . 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 Weakness 11/28/2011  . Hypertension 11/28/2011  . Hypokalemia 11/28/2011  . LUE weakness 11/28/2011  . Hypothyroidism 11/28/2011  . Breast cancer ([place]) 06/19/2011    Stark Bray, DPT, CMP 11/14/2016, 8:52 AM  [facility] [address], [place], [place], [address] Phone: [phone]   Fax:  [phone]  Name: [name] MRN: [id number] Date of Birth: 1950/05/23

## 2016-11-18 ENCOUNTER — Ambulatory Visit: Payer: 59 | Admitting: Physical Therapy

## 2016-11-18 DIAGNOSIS — M545 Low back pain: Secondary | ICD-10-CM | POA: Diagnosis not present

## 2016-11-18 DIAGNOSIS — G8929 Other chronic pain: Secondary | ICD-10-CM | POA: Diagnosis not present

## 2016-11-18 DIAGNOSIS — M546 Pain in thoracic spine: Secondary | ICD-10-CM | POA: Diagnosis not present

## 2016-11-18 DIAGNOSIS — M6281 Muscle weakness (generalized): Secondary | ICD-10-CM | POA: Diagnosis not present

## 2016-11-18 NOTE — Therapy (Signed)
[facility] [address], [place], [place], [address] Phone: [phone]   Fax:  [phone]  Physical Therapy Treatment  Patient Details  Name: [name] MRN: [id number] Date of Birth: 22-Feb-1951 Referring Provider: Dr. Sherwood Gambler  Encounter Date: 11/18/2016      PT End of Session - 11/18/16 0847    Visit Number 6   Date for PT Re-Evaluation 12/25/16   Authorization Type Medicare G codes;  KX at visit 15   PT Start Time 0800   PT Stop Time 0855  dry needling   PT Time Calculation (min) 55 min   Activity Tolerance Patient tolerated treatment well      Past Medical History:  Diagnosis Date  . Arthritis   . Breast cancer ([place])   . Bruises easily   . Cancer [facility]) 2004   rt breast;takes Arimidex daily;thyroid cancer  . History of bronchitis    when she was young  . Hypertension    takes Tribenzor daily  . Hypothyroidism    takes Synthroid daily  . Joint pain   . Joint swelling   . Personal history of chemotherapy 04/2003  . Personal history of radiation therapy 2005  . Pneumonia    hx of when she was young  . Sleep apnea    has cpap but doesn't use;sleep study done 3-59yrs ago  . Urinary frequency     Past Surgical History:  Procedure Laterality Date  . ANTERIOR CERVICAL DECOMP/DISCECTOMY FUSION  2008  . APPENDECTOMY  2007  . BREAST BIOPSY Right 02/28/2003   malignant  . BREAST LUMPECTOMY  2004  . CESAREAN SECTION  44yrs ago  . COLONOSCOPY    . COLONOSCOPY    . KNEE ARTHROSCOPY  2007  . KNEE ARTHROSCOPY  05/02/2011   Procedure: ARTHROSCOPY KNEE;  Surgeon: Johnn Hai;  Location: [place];  Service: Orthopedics;  Laterality: Right;  . MENISCUS DEBRIDEMENT  05/02/2011   Procedure: DEBRIDEMENT OF MENISCUS;  Surgeon: Johnn Hai;  Location: [place];  Service: Orthopedics;  Laterality: Right;  . porta cath remove  2005  . PORTACATH PLACEMENT  2004  .  portacath removal    . THYROID LOBECTOMY Right 09/22/2012   Dr Constance Holster  . THYROIDECTOMY N/A 09/22/2012   Procedure: RIGHT THYROID LOBECTOMY WITH FROZEN SECTION;  Surgeon: Izora Gala, MD;  Location: [place];  Service: ENT;  Laterality: N/A;  . THYROIDECTOMY Left 10/21/2012   Procedure: COMPLETION OF THYROIDECTOMY;  Surgeon: Izora Gala, MD;  Location: [place];  Service: ENT;  Laterality: Left;  . TOTAL KNEE ARTHROPLASTY  04/14/2012   Procedure: TOTAL KNEE ARTHROPLASTY;  Surgeon: Ninetta Lights, MD;  Location: [place];  Service: Orthopedics;  Laterality: Right;  RIGHT ARTHROPLASTY KNEE MEDIAL/LATERAL COMPARTMENTS WITH PATELLA RESURFACING  . TOTAL KNEE ARTHROPLASTY Left 03/30/2013   Procedure: TOTAL KNEE ARTHROPLASTY;  Surgeon: Ninetta Lights, MD;  Location: [place];  Service: Orthopedics;  Laterality: Left;    There were no vitals filed for this visit.      Subjective Assessment - 11/18/16 0802    Subjective Not bad today.  I thought the dry needling helped.  On Sunday the pain started coming back.  Getting another MRI tomorrow afternoon.     Currently in Pain? Yes   Pain Score 5    Pain Location Back   Pain Orientation Right   Pain Type Chronic pain   Pain Onset More than a month ago  Aggravating Factors  end of the day;  sitting;  walking through parking lot at work   Pain Relieving Factors sitting leaning back                         [facility] Adult PT Treatment/Exercise - 11/18/16 0001      Lumbar Exercises: Supine   Ab Set 10 reps   Bridge 5 reps  with ball and green band   Other Supine Lumbar Exercises abdominal brace with alternating ball squeeze and green band 10x     Shoulder Exercises: ROM/Strengthening   UBE (Upper Arm Bike) 6 min forward and backward     Moist Heat Therapy   Number Minutes Moist Heat 15 Minutes   Moist Heat Location Cervical;Lumbar Spine     Electrical Stimulation   Electrical Stimulation Location lumbar   Electrical Stimulation Action IFC    Electrical Stimulation Parameters to tolerance 15 min   Electrical Stimulation Goals Pain     Manual Therapy   Soft tissue mobilization bilateral lumbar paraspinals, gluteals, piriformis and quadratus lumborum          Trigger Point Dry Needling - 11/18/16 0819    Consent Given? Yes   Muscles Treated Upper Body Quadratus Lumborum   Gluteus Maximus Response Twitch response elicited;Palpable increased muscle length   Gluteus Minimus Response Twitch response elicited;Palpable increased muscle length   Piriformis Response Twitch response elicited;Palpable increased muscle length      Performed bilaterally          PT Short Term Goals - 11/18/16 1222      PT SHORT TERM GOAL #1   Title The patient will demonstrate a good understanding of modifications for work/home to support posture (use of lumbar roll, standing periodically, pillow behind knees/between knees for sleeping   11/27/16   Status Achieved     PT SHORT TERM GOAL #2   Title The patient will report a 30% improvement in back pain with home and work ADLs including sitting and walking   Time 4   Period Weeks   Status On-going     PT SHORT TERM GOAL #3   Title The patient will have knowledge and demonstrate compliance with initial HEP for core muscle activation   Time 4   Period Weeks   Status On-going     PT SHORT TERM GOAL #4   Title Thoracic and lumbar extension improved to 25 degrees needed for standing erect   Time 4   Period Weeks   Status On-going           PT Long Term Goals - 11/18/16 1223      PT LONG TERM GOAL #1   Title The patient will be independent in safe self progression of HEP needed for further improvements in ROM and strength, function and decreased pain  12/25/16   Time 8   Period Weeks   Status On-going     PT LONG TERM GOAL #2   Title The patient will have improved core muscle to grossly 4+/5 needed for standing and walking longer periods of time   Time 8   Period Weeks    Status On-going     PT LONG TERM GOAL #3   Title The patient will report a 60% improvement in pain with sitting    Time 8   Period Weeks   Status On-going     PT LONG TERM GOAL #4   Title Periscapular muscle strength  grossly 4+/5 needed for greater ease with lifting   Time 8   Period Weeks   Status On-going     PT LONG TERM GOAL #5   Title FOTO functional outcome improved to 45% indicating improved function with less pain    Time 8   Period Weeks   Status On-going               Plan - 11/18/16 0847    Clinical Impression Statement The patient is able to participate in low level lumbopelvic/hip strengthening without complaints of increased pain.  Decreased tender point size and number compared to previous visits.  Good response to dry needling and manual therapy as well as electrical stimulation and heat.      Rehab Potential Good   PT Frequency 2x / week   PT Duration 8 weeks   PT Treatment/Interventions ADLs/Self Care Home Management;Electrical Stimulation;Cryotherapy;Moist Heat;Traction;Therapeutic exercise;Therapeutic activities;Neuromuscular re-education;Patient/family education;Manual techniques;Taping;Dry needling   PT Next Visit Plan assess response to  DN, STM, and Therex for core and hip strengthening;  resend cert to MD if not signed      Patient will benefit from skilled therapeutic intervention in order to improve the following deficits and impairments:  Decreased activity tolerance, Decreased range of motion, Decreased strength, Postural dysfunction, Improper body mechanics, Pain, Impaired UE functional use, Increased muscle spasms  Visit Diagnosis: Chronic right-sided low back pain without sciatica  Muscle weakness (generalized)  Pain in thoracic spine     Problem List Patient Active Problem List   Diagnosis Date Noted  . DJD (degenerative joint disease) of knee 03/30/2013  . Unspecified sleep apnea 03/23/2013  . 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  Weakness 11/28/2011  . Hypertension 11/28/2011  . Hypokalemia 11/28/2011  . LUE weakness 11/28/2011  . Hypothyroidism 11/28/2011  . Breast cancer ([place]) 06/19/2011   Ruben Im, PT 11/18/16 12:25 PM Phone: [phone] Fax: [phone]  [name] 11/18/2016, 12:25 PM  [facility] [address], [place], [place], [address] Phone: [phone]   Fax:  [phone]  Name: [name] MRN: [id number] Date of Birth: 1951/03/02

## 2016-11-19 DIAGNOSIS — M546 Pain in thoracic spine: Secondary | ICD-10-CM | POA: Diagnosis not present

## 2016-11-19 DIAGNOSIS — M545 Low back pain: Secondary | ICD-10-CM | POA: Diagnosis not present

## 2016-11-21 ENCOUNTER — Ambulatory Visit: Payer: 59 | Admitting: Physical Therapy

## 2016-11-21 DIAGNOSIS — M546 Pain in thoracic spine: Secondary | ICD-10-CM | POA: Diagnosis not present

## 2016-11-21 DIAGNOSIS — M6281 Muscle weakness (generalized): Secondary | ICD-10-CM | POA: Diagnosis not present

## 2016-11-21 DIAGNOSIS — G8929 Other chronic pain: Secondary | ICD-10-CM

## 2016-11-21 DIAGNOSIS — M545 Low back pain, unspecified: Secondary | ICD-10-CM

## 2016-11-21 NOTE — Therapy (Signed)
[facility] [address] [place], [place], [address] Phone: [phone]   Fax:  [phone]  Physical Therapy Treatment  Patient Details  Name: [name] MRN: [id number] Date of Birth: January 14, 1951 Referring Provider: Dr. Newell Coral  Encounter Date: 11/21/2016      PT End of Session - 11/21/16 0825    Visit Number 7   Date for PT Re-Evaluation 12/25/16   Authorization Type Medicare G codes;  KX at visit 15   PT Start Time 0755   PT Stop Time 0840   PT Time Calculation (min) 45 min   Activity Tolerance Patient tolerated treatment well      Past Medical History:  Diagnosis Date  . Arthritis   . Breast cancer ([facility])   . Bruises easily   . Cancer [facility]) 2004   rt breast;takes Arimidex daily;thyroid cancer  . History of bronchitis    when she was young  . Hypertension    takes Tribenzor daily  . Hypothyroidism    takes Synthroid daily  . Joint pain   . Joint swelling   . Personal history of chemotherapy 04/2003  . Personal history of radiation therapy 2005  . Pneumonia    hx of when she was young  . Sleep apnea    has cpap but doesn't use;sleep study done 3-71yrs ago  . Urinary frequency     Past Surgical History:  Procedure Laterality Date  . ANTERIOR CERVICAL DECOMP/DISCECTOMY FUSION  2008  . APPENDECTOMY  2007  . BREAST BIOPSY Right 02/28/2003   malignant  . BREAST LUMPECTOMY  2004  . CESAREAN SECTION  9yrs ago  . COLONOSCOPY    . COLONOSCOPY    . KNEE ARTHROSCOPY  2007  . KNEE ARTHROSCOPY  05/02/2011   Procedure: ARTHROSCOPY KNEE;  Surgeon: Javier Docker;  Location: [facility];  Service: Orthopedics;  Laterality: Right;  . MENISCUS DEBRIDEMENT  05/02/2011   Procedure: DEBRIDEMENT OF MENISCUS;  Surgeon: Javier Docker;  Location: [facility];  Service: Orthopedics;  Laterality: Right;  . porta cath remove  2005  . PORTACATH PLACEMENT  2004  . portacath removal     . THYROID LOBECTOMY Right 09/22/2012   Dr Pollyann Kennedy  . THYROIDECTOMY N/A 09/22/2012   Procedure: RIGHT THYROID LOBECTOMY WITH FROZEN SECTION;  Surgeon: Serena Colonel, MD;  [facility];  Service: ENT;  Laterality: N/A;  . THYROIDECTOMY Left 10/21/2012   Procedure: COMPLETION OF THYROIDECTOMY;  Surgeon: Serena Colonel, MD;  Location: [facility];  Service: ENT;  Laterality: Left;  . TOTAL KNEE ARTHROPLASTY  04/14/2012   Procedure: TOTAL KNEE ARTHROPLASTY;  Surgeon: Loreta Ave, MD;  [facility];  Service: Orthopedics;  Laterality: Right;  RIGHT ARTHROPLASTY KNEE MEDIAL/LATERAL COMPARTMENTS WITH PATELLA RESURFACING  . TOTAL KNEE ARTHROPLASTY Left 03/30/2013   Procedure: TOTAL KNEE ARTHROPLASTY;  Surgeon: Loreta Ave, MD;  [facility];  Service: Orthopedics;  Laterality: Left;    There were no vitals filed for this visit.      Subjective Assessment - 11/21/16 0759    Subjective Remaining forms sent to work for modifications;  The needling felt so good.  I don't feel like I usually do in the low back--it's low.  My elbow bothers me more so.  Patient has MD order for right medial epicondylitis to be done after back rehab.  I had an MRI on my low and middle back.     Currently in Pain?  Yes   Pain Score 3    Pain Location Back   Pain Orientation Lower   Pain Type Chronic pain                         [facility] Adult PT Treatment/Exercise - 11/21/16 0001      Therapeutic Activites    Therapeutic Activities ADL's   ADL's log roll to get in/out of bed     Lumbar Exercises: Stretches   Quad Stretch Limitations psoas doorway stretch 3x5     Lumbar Exercises: Aerobic   Stationary Bike Nu-step L1 10 min     Lumbar Exercises: Seated   Sit to Stand Limitations foam roll push downs 10x 5 sec holds for abdominal activation     Lumbar Exercises: Supine   Ab Set 5 reps   Bent Knee Raise 5 reps   Isometric Hip Flexion 5 reps     Lumbar Exercises: Sidelying   Clam 15 reps    Clam Limitations bil     Moist Heat Therapy   Number Minutes Moist Heat 15 Minutes   Moist Heat Location Lumbar Spine     Electrical Stimulation   Electrical Stimulation Location bil lumbar   Electrical Stimulation Action IFC   Electrical Stimulation Parameters supine 12 ma 15 min   Electrical Stimulation Goals Pain                PT Education - 11/21/16 0825    Education provided Yes   Education Details abdominal brace progression;  clams   Person(s) Educated Patient   Methods Explanation;Demonstration;Handout   Comprehension Verbalized understanding;Returned demonstration          PT Short Term Goals - 11/21/16 0843      PT SHORT TERM GOAL #1   Title The patient will demonstrate a good understanding of modifications for work/home to support posture (use of lumbar roll, standing periodically, pillow behind knees/between knees for sleeping   11/27/16   Status Achieved     PT SHORT TERM GOAL #2   Title The patient will report a 30% improvement in back pain with home and work ADLs including sitting and walking   Status Achieved     PT SHORT TERM GOAL #3   Title The patient will have knowledge and demonstrate compliance with initial HEP for core muscle activation   Status Achieved     PT SHORT TERM GOAL #4   Title Thoracic and lumbar extension improved to 25 degrees needed for standing erect   Time 4   Period Weeks   Status On-going           PT Long Term Goals - 11/21/16 0844      PT LONG TERM GOAL #1   Title The patient will be independent in safe self progression of HEP needed for further improvements in ROM and strength, function and decreased pain  12/25/16   Time 8   Period Weeks   Status On-going     PT LONG TERM GOAL #2   Title The patient will have improved core muscle to grossly 4+/5 needed for standing and walking longer periods of time   Time 8   Period Weeks   Status On-going     PT LONG TERM GOAL #3   Title The patient will report a 60%  improvement in pain with sitting    Time 8   Period Weeks   Status On-going     PT  LONG TERM GOAL #4   Title Periscapular muscle strength grossly 4+/5 needed for greater ease with lifting   Time 8   Period Weeks   Status On-going     PT LONG TERM GOAL #5   Title FOTO functional outcome improved to 45% indicating improved function with less pain    Time 8   Period Weeks   Status On-going               Plan - 11/21/16 1595    Clinical Impression Statement The patient reports a good response to dry needling.  Treatment focus today on low level lumbo/pelvic/hip strengthening and patient education on log rolling to get in/out of bed.   Majority of STGs met.   She reports her chronic pain is decreasing overall.  If she continues to do well for low back pain will discharge her for low back and evaluate (new order received from different MD) for right elbow pain in 1-2 weeks.     PT Next Visit Plan DN to lumbar multifidi, gluteals, piriformis and QL as needed;  recheck lumbar/thoracic extension ROM for STG;  manual therapy;  add resistance to clams; low level core strengthening; electrical stimulation/heat as needed   Recommended Other Services 2nd attempt  certification re-routed      Patient will benefit from skilled therapeutic intervention in order to improve the following deficits and impairments:  Decreased activity tolerance, Decreased range of motion, Decreased strength, Postural dysfunction, Improper body mechanics, Pain, Impaired UE functional use, Increased muscle spasms  Visit Diagnosis: Chronic right-sided low back pain without sciatica  Muscle weakness (generalized)  Pain in thoracic spine     Problem List Patient Active Problem List   Diagnosis Date Noted  . DJD (degenerative joint disease) of knee 03/30/2013  . Unspecified sleep apnea 03/23/2013  . 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 Weakness 11/28/2011  . Hypertension 11/28/2011  . Hypokalemia 11/28/2011  . LUE  weakness 11/28/2011  . Hypothyroidism 11/28/2011  . Breast cancer ([place]) 06/19/2011   Ruben Im, PT 11/21/16 8:56 AM Phone: [phone] Fax: [phone]  Alvera Singh 11/21/2016, 8:54 AM  [facility] [address], [place], [place], [address] Phone: [phone]   Fax:  [phone]  Name: [name] MRN: [id number] Date of Birth: [birth year]

## 2016-11-21 NOTE — Patient Instructions (Signed)
        Abduction: Clam (Eccentric) - Side-Lying   Lie on side with knees bent. Lift top knee, keeping feet together. Keep trunk steady. Slowly lower for 3-5 seconds. 10-15___ reps per set, __1_ sets per day, ___7 days per week. Add band resistance when you achieve _20__ repetitions.  Copyright  VHI. All rights reserved.   Ruben Im PT [facility] [address] [place], [place] [address] Phone # [phone] Fax [phone]

## 2016-11-25 ENCOUNTER — Ambulatory Visit: Payer: 59

## 2016-11-25 DIAGNOSIS — M546 Pain in thoracic spine: Secondary | ICD-10-CM | POA: Diagnosis not present

## 2016-11-25 DIAGNOSIS — G8929 Other chronic pain: Secondary | ICD-10-CM | POA: Diagnosis not present

## 2016-11-25 DIAGNOSIS — M6281 Muscle weakness (generalized): Secondary | ICD-10-CM

## 2016-11-25 DIAGNOSIS — M545 Low back pain: Principal | ICD-10-CM

## 2016-11-25 NOTE — Therapy (Signed)
[facility] [address], [place], [place], [address] Phone: [phone]   Fax:  [phone]  Physical Therapy Treatment  Patient Details  Name: [name] MRN: [id number] Date of Birth: 1950/05/16 Referring Provider: Dr. Sherwood Gambler  Encounter Date: 11/25/2016      PT End of Session - 11/25/16 0844    Visit Number 8   Number of Visits 10   Date for PT Re-Evaluation 12/25/16   Authorization Type Medicare G codes;  KX at visit 15   PT Start Time 0759   PT Stop Time 0900   PT Time Calculation (min) 61 min   Activity Tolerance Patient tolerated treatment well   Behavior During Therapy [facility] for tasks assessed/performed      Past Medical History:  Diagnosis Date  . Arthritis   . Breast cancer ([place])   . Bruises easily   . Cancer [facility]) 2004   rt breast;takes Arimidex daily;thyroid cancer  . History of bronchitis    when she was young  . Hypertension    takes Tribenzor daily  . Hypothyroidism    takes Synthroid daily  . Joint pain   . Joint swelling   . Personal history of chemotherapy 04/2003  . Personal history of radiation therapy 2005  . Pneumonia    hx of when she was young  . Sleep apnea    has cpap but doesn't use;sleep study done 3-8yrs ago  . Urinary frequency     Past Surgical History:  Procedure Laterality Date  . ANTERIOR CERVICAL DECOMP/DISCECTOMY FUSION  2008  . APPENDECTOMY  2007  . BREAST BIOPSY Right 02/28/2003   malignant  . BREAST LUMPECTOMY  2004  . CESAREAN SECTION  70yrs ago  . COLONOSCOPY    . COLONOSCOPY    . KNEE ARTHROSCOPY  2007  . KNEE ARTHROSCOPY  05/02/2011   Procedure: ARTHROSCOPY KNEE;  Surgeon: Johnn Hai;  Location: [place];  Service: Orthopedics;  Laterality: Right;  . MENISCUS DEBRIDEMENT  05/02/2011   Procedure: DEBRIDEMENT OF MENISCUS;  Surgeon: Johnn Hai;  Location: [place];  Service: Orthopedics;  Laterality:  Right;  . porta cath remove  2005  . PORTACATH PLACEMENT  2004  . portacath removal    . THYROID LOBECTOMY Right 09/22/2012   Dr Constance Holster  . THYROIDECTOMY N/A 09/22/2012   Procedure: RIGHT THYROID LOBECTOMY WITH FROZEN SECTION;  Surgeon: Izora Gala, MD;  Location: [place];  Service: ENT;  Laterality: N/A;  . THYROIDECTOMY Left 10/21/2012   Procedure: COMPLETION OF THYROIDECTOMY;  Surgeon: Izora Gala, MD;  Location: [place];  Service: ENT;  Laterality: Left;  . TOTAL KNEE ARTHROPLASTY  04/14/2012   Procedure: TOTAL KNEE ARTHROPLASTY;  Surgeon: Ninetta Lights, MD;  Location: [place];  Service: Orthopedics;  Laterality: Right;  RIGHT ARTHROPLASTY KNEE MEDIAL/LATERAL COMPARTMENTS WITH PATELLA RESURFACING  . TOTAL KNEE ARTHROPLASTY Left 03/30/2013   Procedure: TOTAL KNEE ARTHROPLASTY;  Surgeon: Ninetta Lights, MD;  Location: [place];  Service: Orthopedics;  Laterality: Left;    There were no vitals filed for this visit.      Subjective Assessment - 11/25/16 0807    Subjective Pt reports increased Rt UE pain and reports shooting pain.  LBP is improved and I was able to sleep on my back.  Had MRI of thoracic and lumbar spine on Thursday.     Diagnostic tests MRI;  Xray;  will do another MRI of another part of  back   Patient Stated Goals Hopefully give some relief;  I can't understand why it started in my left arm, then back and now right arm   Currently in Pain? Yes   Pain Score 5    Pain Location Back   Pain Orientation Lower   Pain Descriptors / Indicators Aching   Pain Type Chronic pain   Pain Onset More than a month ago   Pain Frequency Intermittent   Aggravating Factors  sitting for long periods   Pain Relieving Factors standing up/change of position   Pain Score 0   Pain Location Arm   Pain Orientation Right   Pain Descriptors / Indicators Tingling;Aching   Pain Type Chronic pain   Pain Onset 1 to 4 weeks ago   Pain Frequency Intermittent   Aggravating Factors  movement/use   Pain  Relieving Factors rest                         [facility] Adult PT Treatment/Exercise - 11/25/16 0001      Neck Exercises: Theraband   Horizontal ABduction 20 reps;Red  supine     Lumbar Exercises: Aerobic   Stationary Bike Nu-step L1 10 min     Lumbar Exercises: Supine   Ab Set 10 reps   Other Supine Lumbar Exercises ER and horizontal abduction with abdominal bracing 2x10     Lumbar Exercises: Sidelying   Clam 15 reps   Clam Limitations bil     Shoulder Exercises: ROM/Strengthening   Other ROM/Strengthening Exercises seated shoulder flexion x10     Moist Heat Therapy   Number Minutes Moist Heat 15 Minutes   Moist Heat Location Lumbar Spine     Electrical Stimulation   Electrical Stimulation Location bil lumbar   Electrical Stimulation Action IFC   Electrical Stimulation Parameters 15 minutes   Electrical Stimulation Goals Pain                PT Education - 11/25/16 [phone]    Education provided Yes   Education Details supine red theraband   Person(s) Educated Patient   Methods Explanation;Demonstration   Comprehension Verbalized understanding;Returned demonstration          PT Short Term Goals - 11/21/16 0843      PT SHORT TERM GOAL #1   Title The patient will demonstrate a good understanding of modifications for work/home to support posture (use of lumbar roll, standing periodically, pillow behind knees/between knees for sleeping   11/27/16   Status Achieved     PT SHORT TERM GOAL #2   Title The patient will report a 30% improvement in back pain with home and work ADLs including sitting and walking   Status Achieved     PT SHORT TERM GOAL #3   Title The patient will have knowledge and demonstrate compliance with initial HEP for core muscle activation   Status Achieved     PT SHORT TERM GOAL #4   Title Thoracic and lumbar extension improved to 25 degrees needed for standing erect   Time 4   Period Weeks   Status On-going            PT Long Term Goals - 11/25/16 0815      PT LONG TERM GOAL #1   Title The patient will be independent in safe self progression of HEP needed for further improvements in ROM and strength, function and decreased pain  12/25/16   Time 8   Period  Weeks   Status On-going     PT LONG TERM GOAL #3   Title The patient will report a 60% improvement in pain with sitting    Baseline 80% overall improvement   Status Achieved     PT LONG TERM GOAL #5   Title FOTO functional outcome improved to 45% indicating improved function with less pain    Period Weeks   Status On-going               Plan - 11/25/16 0818    Clinical Impression Statement Pt with onset of Rt elbow pain that has increased over the past few days.  Pt had imaging of the thoracic and lumbar spine and has not received results yet.  Pt reports 80% overall improvement in LBP since the start of care.  PT focused on thoracic/arm strength with core engagement today.  Pt will continue to benefit from skilled PT for core strength, flexibility, endurance, UE strength, postural strength.     Rehab Potential Good   PT Frequency 2x / week   PT Duration 8 weeks   PT Treatment/Interventions ADLs/Self Care Home Management;Electrical Stimulation;Cryotherapy;Moist Heat;Traction;Therapeutic exercise;Therapeutic activities;Neuromuscular re-education;Patient/family education;Manual techniques;Taping;Dry needling   PT Next Visit Plan core strength, endurance, UE/postural strength, dry needling to low back as needed.  Measure lumbar/thoracic A/ROM.   Recommended Other Services initial certification has been signed.   Consulted and Agree with Plan of Care Patient      Patient will benefit from skilled therapeutic intervention in order to improve the following deficits and impairments:  Decreased activity tolerance, Decreased range of motion, Decreased strength, Postural dysfunction, Improper body mechanics, Pain, Impaired UE functional use,  Increased muscle spasms  Visit Diagnosis: Chronic right-sided low back pain without sciatica  Muscle weakness (generalized)  Pain in thoracic spine     Problem List Patient Active Problem List   Diagnosis Date Noted  . DJD (degenerative joint disease) of knee 03/30/2013  . Unspecified sleep apnea 03/23/2013  . 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 Weakness 11/28/2011  . Hypertension 11/28/2011  . Hypokalemia 11/28/2011  . LUE weakness 11/28/2011  . Hypothyroidism 11/28/2011  . Breast cancer ([place]) 06/19/2011     Sigurd Sos, PT 11/25/16 8:46 AM  [facility] [address], [place], [place], [address] Phone: [phone]   Fax:  [phone]  Name: [name] MRN: [id number] Date of Birth: [birth year]

## 2016-11-25 NOTE — Patient Instructions (Addendum)
    Side Pull: Double Arm   On back, knees bent, feet flat. Arms perpendicular to body, shoulder level, elbows straight but relaxed. Pull arms out to sides, elbows straight. Resistance band comes across collarbones, hands toward floor. Hold momentarily. Slowly return to starting position. Repeat _10__ times. Band color _red___      Shoulder Rotation: Double Arm   On back, knees bent, feet flat, elbows tucked at sides, bent 90, hands palms up. Pull hands apart and down toward floor, keeping elbows near sides. Hold momentarily. Slowly return to starting position. Repeat _10__ times. Band color __red         [facility] [address], [place], [place] [address] Phone # [phone] Fax [phone]____

## 2016-11-28 ENCOUNTER — Ambulatory Visit: Payer: 59 | Attending: Neurosurgery | Admitting: Physical Therapy

## 2016-11-28 DIAGNOSIS — M6281 Muscle weakness (generalized): Secondary | ICD-10-CM | POA: Insufficient documentation

## 2016-11-28 DIAGNOSIS — M5136 Other intervertebral disc degeneration, lumbar region: Secondary | ICD-10-CM | POA: Diagnosis not present

## 2016-11-28 DIAGNOSIS — I10 Essential (primary) hypertension: Secondary | ICD-10-CM | POA: Diagnosis not present

## 2016-11-28 DIAGNOSIS — M546 Pain in thoracic spine: Secondary | ICD-10-CM | POA: Diagnosis not present

## 2016-11-28 DIAGNOSIS — M47816 Spondylosis without myelopathy or radiculopathy, lumbar region: Secondary | ICD-10-CM | POA: Diagnosis not present

## 2016-11-28 DIAGNOSIS — M545 Low back pain: Secondary | ICD-10-CM | POA: Insufficient documentation

## 2016-11-28 DIAGNOSIS — M5134 Other intervertebral disc degeneration, thoracic region: Secondary | ICD-10-CM | POA: Diagnosis not present

## 2016-11-28 DIAGNOSIS — M4155 Other secondary scoliosis, thoracolumbar region: Secondary | ICD-10-CM | POA: Diagnosis not present

## 2016-11-28 DIAGNOSIS — Z6837 Body mass index (BMI) 37.0-37.9, adult: Secondary | ICD-10-CM | POA: Diagnosis not present

## 2016-11-28 DIAGNOSIS — M47814 Spondylosis without myelopathy or radiculopathy, thoracic region: Secondary | ICD-10-CM | POA: Diagnosis not present

## 2016-11-28 DIAGNOSIS — G8929 Other chronic pain: Secondary | ICD-10-CM | POA: Diagnosis not present

## 2016-11-28 NOTE — Therapy (Signed)
[facility] [address], [place], [place], [address] Phone: [phone]   Fax:  [phone]  Physical Therapy Treatment/Discharge Summary  Patient Details  Name: [name] MRN: [id number] Date of Birth: 01-28-1951 Referring Provider: Dr. Sherwood Gambler  Encounter Date: 11/28/2016      PT End of Session - 11/28/16 0847    Visit Number 9   Number of Visits 10   Date for PT Re-Evaluation 12/25/16   Authorization Type Medicare G codes;  KX at visit 15   PT Start Time 0750   PT Stop Time 0840   PT Time Calculation (min) 50 min   Activity Tolerance Patient tolerated treatment well      Past Medical History:  Diagnosis Date  . Arthritis   . Breast cancer ([place])   . Bruises easily   . Cancer [facility]) 2004   rt breast;takes Arimidex daily;thyroid cancer  . History of bronchitis    when she was young  . Hypertension    takes Tribenzor daily  . Hypothyroidism    takes Synthroid daily  . Joint pain   . Joint swelling   . Personal history of chemotherapy 04/2003  . Personal history of radiation therapy 2005  . Pneumonia    hx of when she was young  . Sleep apnea    has cpap but doesn't use;sleep study done 3-58yr ago  . Urinary frequency     Past Surgical History:  Procedure Laterality Date  . ANTERIOR CERVICAL DECOMP/DISCECTOMY FUSION  2008  . APPENDECTOMY  2007  . BREAST BIOPSY Right 02/28/2003   malignant  . BREAST LUMPECTOMY  2004  . CESAREAN SECTION  [place]  . COLONOSCOPY    . COLONOSCOPY    . KNEE ARTHROSCOPY  2007  . KNEE ARTHROSCOPY  05/02/2011   Procedure: ARTHROSCOPY KNEE;  Surgeon: JeJohnn Hai [place] Service: Orthopedics;  Laterality: Right;  . MENISCUS DEBRIDEMENT  05/02/2011   Procedure: DEBRIDEMENT OF MENISCUS;  Surgeon: JeJohnn Hai [place] Service: Orthopedics;  Laterality: Right;  . porta cath remove  2005  .  PORTACATH PLACEMENT  2004  . portacath removal    . THYROID LOBECTOMY Right 09/22/2012   Dr RoConstance Holster. THYROIDECTOMY N/A 09/22/2012   Procedure: RIGHT THYROID LOBECTOMY WITH FROZEN SECTION;  Surgeon: JeIzora GalaMD;  [place] Service: [place];  Laterality: N/A;  . THYROIDECTOMY Left 10/21/2012   Procedure: COMPLETION OF THYROIDECTOMY;  Surgeon: JeIzora GalaMD;  [place] Service: ENT;  Laterality: Left;  . TOTAL KNEE ARTHROPLASTY  04/14/2012   Procedure: TOTAL KNEE ARTHROPLASTY;  Surgeon: DaNinetta LightsMD;  [place] Service: Orthopedics;  Laterality: Right;  RIGHT ARTHROPLASTY KNEE MEDIAL/LATERAL COMPARTMENTS WITH PATELLA RESURFACING  . TOTAL KNEE ARTHROPLASTY Left 03/30/2013   Procedure: TOTAL KNEE ARTHROPLASTY;  Surgeon: DaNinetta LightsMD;  [place] Service: Orthopedics;  Laterality: Left;    There were no vitals filed for this visit.      Subjective Assessment - 11/28/16 0749    Subjective My back is OK but this past weekend had shooting pain in right elbow.  I was just sitting watching TV.  I see Dr. NuSherwood Gambleroday to get the MRI results.  Awaiting response from work regarding stand up desk and ergonomic changes.     Currently in Pain? Yes   Pain Score 6  Pain Location Back   Pain Orientation Lower   Pain Type Chronic pain   Pain Frequency Intermittent   Aggravating Factors  sitting for long periods   Pain Relieving Factors standing up/change of position            [facility] PT Assessment - 11/28/16 0001      Observation/Other Assessments   Focus on Therapeutic Outcomes (FOTO)  46% limitation     AROM   Cervical Flexion 30   Cervical Extension 35   Cervical - Right Side Bend 35   Cervical - Left Side Bend 25   Lumbar Flexion 60   Lumbar Extension 25   Lumbar - Right Side Bend 20   Lumbar - Left Side Bend 20   Thoracic - Right Rotation 30   Thoracic - Left Rotation 30     Strength   Right Shoulder Horizontal ABduction 4/5   Left  Shoulder Horizontal ABduction 4/5   Lumbar Flexion 4-/5   Lumbar Extension 4-/5                     [facility] Adult PT Treatment/Exercise - 11/28/16 0001      Neuro Re-ed    Neuro Re-ed Details  transverse abdominus, gluteal and multifidi activation     Lumbar Exercises: Supine   Ab Set 5 reps   AB Set Limitations review of previously given HEP   Clam 20 reps   Clam Limitations red band single and double   Bridge 5 reps   Other Supine Lumbar Exercises whole leg press down 5x each leg   Other Supine Lumbar Exercises SLR 5x each leg     Manual Therapy   Soft tissue mobilization left thoracic and lumbar paraspinals and quadratus lumborum          Trigger Point Dry Needling - 11/28/16 0845    Consent Given? Yes   Muscles Treated Upper Body Quadratus Lumborum   Muscles Treated Lower Body --  left lumbar and lower thoracic paraspinals with improved soft tissue length following                PT Short Term Goals - 11/28/16 0813      PT SHORT TERM GOAL #1   Title The patient will demonstrate a good understanding of modifications for work/home to support posture (use of lumbar roll, standing periodically, pillow behind knees/between knees for sleeping   11/27/16   Status Achieved     PT SHORT TERM GOAL #2   Title The patient will report a 30% improvement in back pain with home and work ADLs including sitting and walking   Status Achieved     PT SHORT TERM GOAL #3   Title The patient will have knowledge and demonstrate compliance with initial HEP for core muscle activation   Status Achieved     PT SHORT TERM GOAL #4   Title Thoracic and lumbar extension improved to 25 degrees needed for standing erect   Status Achieved           PT Long Term Goals - 11/28/16 0813      PT LONG TERM GOAL #1   Title The patient will be independent in safe self progression of HEP needed for further improvements in ROM and strength, function and decreased pain  12/25/16    Status Achieved     PT LONG TERM GOAL #2   Title The patient will have improved core muscle to grossly 4+/5 needed for standing  and walking longer periods of time   Status Partially Met     PT LONG TERM GOAL #3   Title The patient will report a 60% improvement in pain with sitting    Status Achieved     PT LONG TERM GOAL #4   Title Periscapular muscle strength grossly 4+/5 needed for greater ease with lifting   Status Partially Met               Plan - Dec 28, 2016 0847    Clinical Impression Statement The patient reports her back is 80-85% better since starting PT but she has right medial elbow pain which has caused her the most problem over the past week.  She has a PT order for eval and treat of right medial epicondylitis from Dr. Percell Miller.  The patient would like to continue with her back HEP on her own and focus on her right elbow.  She has partially met current rehab goals including a good improvement in FOTO functional outcome score.   She reports good pain relief with dry needling in conjunction with exercise, modalities and manual therapy.  Work modification recommendations  have been made.   Will discharge her from back rehab at this time and evaluate her right UE next week.        Patient will benefit from skilled therapeutic intervention in order to improve the following deficits and impairments:  Decreased activity tolerance, Decreased range of motion, Decreased strength, Postural dysfunction, Improper body mechanics, Pain, Impaired UE functional use, Increased muscle spasms  Visit Diagnosis: Chronic right-sided low back pain without sciatica  Muscle weakness (generalized)  Pain in thoracic spine       G-Codes - 12/28/2016 0199    Functional Assessment Tool Used (Outpatient Only) FOTO;  clinical judgement    Functional Limitation Mobility: Walking and moving around   Mobility: Walking and Moving Around Goal Status [phone]) At least 20 percent but less than 40 percent  impaired, limited or restricted   Mobility: Walking and Moving Around Discharge Status [phone]) At least 20 percent but less than 40 percent impaired, limited or restricted     PHYSICAL THERAPY DISCHARGE SUMMARY  Visits from Start of Care: 9  Current functional level related to goals / functional outcomes: See clinical impressions above   Remaining deficits: As above   Education / Equipment: Comprehensive HEP Plan: Patient agrees to discharge.  Patient goals were partially met. Patient is being discharged due to being pleased with the current functional level.  ?????        Problem List Patient Active Problem List   Diagnosis Date Noted  . DJD (degenerative joint disease) of knee 03/30/2013  . Unspecified sleep apnea 03/23/2013  . 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 Weakness 11/28/2011  . Hypertension 11/28/2011  . Hypokalemia 11/28/2011  . LUE weakness 11/28/2011  . Hypothyroidism 11/28/2011  . Breast cancer ([place]) 06/19/2011   Ruben Im, PT 12/28/16 9:00 AM Phone: [phone] Fax: [phone]  Alvera Singh 12-28-16, 8:55 AM  [facility] [address], [place], [place], [address] Phone: [phone]   Fax:  [phone]  Name: [name] MRN: [id number] Date of Birth: May 28, 1950

## 2016-12-16 ENCOUNTER — Encounter: Payer: Self-pay | Admitting: Rehabilitation

## 2016-12-16 ENCOUNTER — Ambulatory Visit: Payer: 59 | Attending: Orthopedic Surgery | Admitting: Rehabilitation

## 2016-12-16 DIAGNOSIS — M79645 Pain in left finger(s): Secondary | ICD-10-CM

## 2016-12-16 DIAGNOSIS — M25521 Pain in right elbow: Secondary | ICD-10-CM | POA: Diagnosis not present

## 2016-12-16 DIAGNOSIS — R293 Abnormal posture: Secondary | ICD-10-CM

## 2016-12-16 DIAGNOSIS — M6281 Muscle weakness (generalized): Secondary | ICD-10-CM | POA: Diagnosis not present

## 2016-12-16 NOTE — Therapy (Signed)
[facility] [address] [place], [place], [address] Phone: [phone]   Fax:  [phone]  Physical Therapy Evaluation  Patient Details  Name: [name] MRN: [id number] Date of Birth: [birth year] Referring Provider: Dr. Edmonia Lynch  Encounter Date: 12/16/2016      PT End of Session - 12/16/16 0854    Visit Number 1   Number of Visits 10   Date for PT Re-Evaluation 01/20/17   Authorization Type Medicare G codes;  KX at visit 15   PT Start Time 0800   PT Stop Time 0845   PT Time Calculation (min) 45 min   Activity Tolerance Patient tolerated treatment well   Behavior During Therapy [facility] for tasks assessed/performed      Past Medical History:  Diagnosis Date  . Arthritis   . Breast cancer ([place])   . Bruises easily   . Cancer [facility]) 2004   rt breast;takes Arimidex daily;thyroid cancer  . History of bronchitis    when she was young  . Hypertension    takes Tribenzor daily  . Hypothyroidism    takes Synthroid daily  . Joint pain   . Joint swelling   . Personal history of chemotherapy 04/2003  . Personal history of radiation therapy 2005  . Pneumonia    hx of when she was young  . Sleep apnea    has cpap but doesn't use;sleep study done 3-28yrs ago  . Urinary frequency     Past Surgical History:  Procedure Laterality Date  . ANTERIOR CERVICAL DECOMP/DISCECTOMY FUSION  2008  . APPENDECTOMY  2007  . BREAST BIOPSY Right 02/28/2003   malignant  . BREAST LUMPECTOMY  2004  . CESAREAN SECTION  34yrs ago  . COLONOSCOPY    . COLONOSCOPY    . KNEE ARTHROSCOPY  2007  . KNEE ARTHROSCOPY  05/02/2011   Procedure: ARTHROSCOPY KNEE;  Surgeon: Johnn Hai;  Location: [place];  Service: Orthopedics;  Laterality: Right;  . MENISCUS DEBRIDEMENT  05/02/2011   Procedure: DEBRIDEMENT OF MENISCUS;  Surgeon: Johnn Hai;  Location: [place];  Service: Orthopedics;   Laterality: Right;  . porta cath remove  2005  . PORTACATH PLACEMENT  2004  . portacath removal    . THYROID LOBECTOMY Right 09/22/2012   Dr Constance Holster  . THYROIDECTOMY N/A 09/22/2012   Procedure: RIGHT THYROID LOBECTOMY WITH FROZEN SECTION;  Surgeon: Izora Gala, MD;  Location: [place];  Service: ENT;  Laterality: N/A;  . THYROIDECTOMY Left 10/21/2012   Procedure: COMPLETION OF THYROIDECTOMY;  Surgeon: Izora Gala, MD;  Location: [place];  Service: ENT;  Laterality: Left;  . TOTAL KNEE ARTHROPLASTY  04/14/2012   Procedure: TOTAL KNEE ARTHROPLASTY;  Surgeon: Ninetta Lights, MD;  Location: [place];  Service: Orthopedics;  Laterality: Right;  RIGHT ARTHROPLASTY KNEE MEDIAL/LATERAL COMPARTMENTS WITH PATELLA RESURFACING  . TOTAL KNEE ARTHROPLASTY Left 03/30/2013   Procedure: TOTAL KNEE ARTHROPLASTY;  Surgeon: Ninetta Lights, MD;  Location: [place];  Service: Orthopedics;  Laterality: Left;    There were no vitals filed for this visit.       Subjective Assessment - 12/16/16 0800    Subjective Pt presents wtih complaints of pain in bilateral UEs.  The L arm began hurting last september and presents with most pain in the hand near the tenar eminence shooting up towards the mid upper arm and the R arm starting in july presents with pain mainly in the  elbowon the medial aspect.  The R side is mainly elbow pain but can move up into the shoulder.  The R side has been worse than the Left lately.  R handed.  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     Pertinent History hx of breast CA;  2 TKRs; HTN;  neck surgery 10 years ago with arm pain.  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     Limitations Lifting   Patient Stated Goals Decrease the pain in both UEs   Currently in Pain? No/denies   Pain Score --  Has been at 5/10 lately   Pain Location Hand   Pain Orientation Left   Pain Descriptors / Indicators Aching   Pain Type Chronic pain   Pain Onset More than a month ago   Pain Frequency Intermittent    Aggravating Factors  Thumb movements   Pain Relieving Factors rest   Multiple Pain Sites Yes   Pain Score 0  no R elbow pain currently up to 7/10   Pain Location Elbow   Pain Orientation Right   Pain Descriptors / Indicators Aching   Pain Type Acute pain   Pain Onset More than a month ago   Pain Frequency Intermittent   Aggravating Factors  desk activities, using the UE too much   Pain Relieving Factors rest, heat            [facility] PT Assessment - 12/16/16 0001      Assessment   Medical Diagnosis Pain in the L hand and the R elbow   Referring Provider Dr. Edmonia Lynch   Onset Date/Surgical Date 12/28/15   Hand Dominance Right   Next MD Visit as needed   Prior Therapy other issues     Precautions   Precautions None     Restrictions   Weight Bearing Restrictions No     Balance Screen   Has the patient fallen in the past 6 months No   Has the patient had a decrease in activity level because of a fear of falling?  No   Is the patient reluctant to leave their home because of a fear of falling?  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;  travelling, reading, blogger     Observation/Other Assessments   Focus on Therapeutic Outcomes (FOTO)  50% limited     Sensation   Additional Comments occasional L hand numbness and tingling     Posture/Postural Control   Posture/Postural Control Postural limitations   Postural Limitations Rounded Shoulders;Forward head;Decreased lumbar lordosis   Posture Comments right shoulder elevated, left trunk shift      ROM / Strength   AROM / PROM / Strength AROM;PROM;Strength     AROM   Overall AROM Comments pain in the L hand reproduced with thumb opposition, and abduction   AROM Assessment Site Elbow;Forearm;Wrist   Right Shoulder Flexion 175 Degrees    Right Shoulder ABduction 165 Degrees   Left Shoulder Flexion 175 Degrees   Left Shoulder ABduction 165 Degrees   Right/Left Elbow Right;Left   Right Elbow Flexion 150   Right Elbow Extension 0   Left Elbow Flexion 150   Left Elbow Extension 0   Right/Left Forearm Right;Left   Right Forearm Pronation 90 Degrees   Right  Forearm Supination 90 Degrees   Left Forearm Pronation 90 Degrees   Left Forearm Supination 90 Degrees   Right/Left Wrist Left   Right Wrist Extension 60 Degrees   Right Wrist Flexion 70 Degrees   Right Wrist Radial Deviation 25 Degrees   Right Wrist Ulnar Deviation 30 Degrees   Left Wrist Extension 60 Degrees  slight pain in the upper arm   Left Wrist Flexion 60 Degrees  pull     Strength   Strength Assessment Site Elbow;Forearm;Wrist;Hand   Right/Left Elbow Right;Left   Right Elbow Flexion 4/5  pain   Right Elbow Extension 4/5   Left Elbow Flexion 4/5   Left Elbow Extension 4/5   Right/Left Forearm Right;Left   Right Forearm Pronation 4/5  pain   Right Forearm Supination 4/5   Left Forearm Pronation 4/5   Left Forearm Supination 4/5   Right/Left Wrist Right;Left   Right Wrist Flexion 4-/5  pain   Right Wrist Extension 4/5   Right Wrist Radial Deviation 4-/5   Right Wrist Ulnar Deviation 4-/5   Left Wrist Flexion 4/5   Left Wrist Extension 4/5   Left Wrist Radial Deviation 4-/5   Left Wrist Ulnar Deviation 4-/5   Right/Left hand Right;Left   Right Hand Grip (lbs) 35   Left Hand Grip (lbs) 20  thumb pain     Palpation   Palpation comment not tested in depth today due to time            Objective measurements completed on examination: See above findings.                  PT Education - 12/16/16 0853    Education provided Yes   Education Details Diagnosis, POC, HEP   Person(s) Educated Patient   Methods Explanation;Demonstration;Handout;Verbal cues   Comprehension Verbalized understanding;Returned demonstration;Need  further instruction          PT Short Term Goals - 12/16/16 0926      PT SHORT TERM GOAL #1   Title The patient will demonstrate a good understanding of modifications for work/home to support posture and correct wrist positioning   Time 2   Period Weeks   Status New   Target Date 12/30/16     PT SHORT TERM GOAL #2   Title pt will be independent with initial HEP   Time 2   Period Weeks   Status New   Target Date 12/30/16           PT Long Term Goals - 12/16/16 0927      PT LONG TERM GOAL #1   Title The patient will be independent in safe self progression of HEP needed for further improvements in ROM and strength, function and decreased pain   Time 8   Period Weeks   Status New   Target Date 02/10/17     PT LONG TERM GOAL #2   Title The patient will improve bil UE AROM to painfree    Time 8   Period Weeks   Status New   Target Date 02/10/17     PT LONG TERM GOAL #3   Title the patient will improve bil elbow and wrist strength to 4+/5 or greater   Time 8   Period Weeks   Status New   Target Date 02/10/17     PT LONG TERM GOAL #4   Title Periscapular muscle strength grossly 4+/5 needed for greater ease with lifting   Time 8  Period Weeks   Status New   Target Date 02/10/17     PT LONG TERM GOAL #5   Title FOTO functional outcome improved to 30% indicating improved function with less pain    Time 8   Period Weeks   Status New   Target Date 02/10/17                Plan - 12/16/16 0855    Clinical Impression Statement Pt is a 66 year old female who was recently here for LBP who is now experiencing 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  The R thumb pain is chronic and has been going on for almost 1 year with injection attempts and already with the use of a thumb spica brace.  The pain is the worst with thumb opposition, extension, and abduction.  The L UE also experiences some forearm and upper arm pain due to chronic nature of the  OA now with myofascial tightness into the UE.  Neural tension not performed today but should be tested.  The Right elbow presents with pain in the medial elbow worse with resisted elbow flexion, pronation, and wrist flexion.  Shoulder strength and mobility also not assessed yet.  Weakness present generally bilateral UEs wrist, elbow, and shoulder from previous testing.     History and Personal Factors relevant to plan of care: cervical surgery, R breast CA, bil TKR, HTN   Clinical Presentation Evolving   Clinical Presentation due to: recent presentation of R elbow pain as well as chronic pain in multiple locations   Clinical Decision Making Moderate   Rehab Potential Good   PT Frequency 2x / week   PT Duration 8 weeks   PT Treatment/Interventions ADLs/Self Care Home Management;Electrical Stimulation;Cryotherapy;Moist Heat;Traction;Therapeutic exercise;Therapeutic activities;Neuromuscular re-education;Patient/family education;Manual techniques;Taping;Dry needling   PT Next Visit Plan complete assessment as needed (shoulder mobility and strength, palpation findings, neural tension) review HEP; begin plan for R medial epi and L thumb/UE pain and tightness (pt interested in needling)      Patient will benefit from skilled therapeutic intervention in order to improve the following deficits and impairments:  Decreased activity tolerance, Decreased range of motion, Decreased strength, Postural dysfunction, Improper body mechanics, Pain, Impaired UE functional use, Increased muscle spasms  Visit Diagnosis: Pain in right elbow - Plan: PT plan of care cert/re-cert  Thumb pain, left - Plan: PT plan of care cert/re-cert  Muscle weakness (generalized) - Plan: PT plan of care cert/re-cert  Abnormal posture - Plan: PT plan of care cert/re-cert     Problem List Patient Active Problem List   Diagnosis Date Noted  . DJD (degenerative joint disease) of knee 03/30/2013  . Unspecified sleep apnea  03/23/2013  . 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 Weakness 11/28/2011  . Hypertension 11/28/2011  . Hypokalemia 11/28/2011  . LUE weakness 11/28/2011  . Hypothyroidism 11/28/2011  . Breast cancer ([place]) 06/19/2011    Stark Bray, DPT, CMP 12/16/2016, 10:05 AM  [facility] [address], [place], [place], [address] Phone: [phone]   Fax:  [phone]  Name: [name] MRN: [id number] Date of Birth: [birth year]

## 2016-12-23 ENCOUNTER — Ambulatory Visit: Payer: 59 | Admitting: Physical Therapy

## 2016-12-23 DIAGNOSIS — M6281 Muscle weakness (generalized): Secondary | ICD-10-CM | POA: Diagnosis not present

## 2016-12-23 DIAGNOSIS — M25521 Pain in right elbow: Secondary | ICD-10-CM

## 2016-12-23 DIAGNOSIS — R293 Abnormal posture: Secondary | ICD-10-CM | POA: Diagnosis not present

## 2016-12-23 DIAGNOSIS — M79645 Pain in left finger(s): Secondary | ICD-10-CM

## 2016-12-23 NOTE — Patient Instructions (Signed)

## 2016-12-23 NOTE — Therapy (Signed)
[facility] [address], [place], [place], [address] Phone: [phone]   Fax:  [phone]  Physical Therapy Treatment  Patient Details  Name: [name] MRN: [id number] Date of Birth: 08/14/1950 Referring Provider: Dr. Edmonia Lynch  Encounter Date: 12/23/2016      PT End of Session - 12/23/16 0842    Visit Number 2   Number of Visits 10   Date for PT Re-Evaluation 01/20/17   Authorization Type Medicare G codes;  KX at visit 15   PT Start Time 0758   PT Stop Time 0841   PT Time Calculation (min) 43 min   Activity Tolerance Patient tolerated treatment well   Behavior During Therapy [facility] for tasks assessed/performed      Past Medical History:  Diagnosis Date  . Arthritis   . Breast cancer ([place])   . Bruises easily   . Cancer [facility]) 2004   rt breast;takes Arimidex daily;thyroid cancer  . History of bronchitis    when she was young  . Hypertension    takes Tribenzor daily  . Hypothyroidism    takes Synthroid daily  . Joint pain   . Joint swelling   . Personal history of chemotherapy 04/2003  . Personal history of radiation therapy 2005  . Pneumonia    hx of when she was young  . Sleep apnea    has cpap but doesn't use;sleep study done 3-62yr ago  . Urinary frequency     Past Surgical History:  Procedure Laterality Date  . ANTERIOR CERVICAL DECOMP/DISCECTOMY FUSION  2008  . APPENDECTOMY  2007  . BREAST BIOPSY Right 02/28/2003   malignant  . BREAST LUMPECTOMY  2004  . CESAREAN SECTION  [place]  . COLONOSCOPY    . COLONOSCOPY    . KNEE ARTHROSCOPY  2007  . KNEE ARTHROSCOPY  05/02/2011   Procedure: ARTHROSCOPY KNEE;  Surgeon: JeJohnn Hai [place] [facility] Service: Orthopedics;  Laterality: Right;  . MENISCUS DEBRIDEMENT  05/02/2011   Procedure: DEBRIDEMENT OF MENISCUS;  Surgeon: JeJohnn Hai [place] [facility] Service: Orthopedics;   Laterality: Right;  . porta cath remove  2005  . PORTACATH PLACEMENT  2004  . portacath removal    . THYROID LOBECTOMY Right 09/22/2012   Dr RoConstance Holster. THYROIDECTOMY N/A 09/22/2012   Procedure: RIGHT THYROID LOBECTOMY WITH FROZEN SECTION;  Surgeon: JeIzora GalaMD;  [place] [facility] Service: [place];  Laterality: N/A;  . THYROIDECTOMY Left 10/21/2012   Procedure: COMPLETION OF THYROIDECTOMY;  Surgeon: JeIzora GalaMD;  [place] [facility] Service: E[place];  Laterality: Left;  . TOTAL KNEE ARTHROPLASTY  04/14/2012   Procedure: TOTAL KNEE ARTHROPLASTY;  Surgeon: DaNinetta LightsMD;  [place] [facility] Service: Orthopedics;  Laterality: Right;  RIGHT ARTHROPLASTY KNEE MEDIAL/LATERAL COMPARTMENTS WITH PATELLA RESURFACING  . TOTAL KNEE ARTHROPLASTY Left 03/30/2013   Procedure: TOTAL KNEE ARTHROPLASTY;  Surgeon: DaNinetta LightsMD;  [place] [facility] Service: Orthopedics;  Laterality: Left;    There were no vitals filed for this visit.      Subjective Assessment - 12/23/16 0759    Subjective (P)  has been trying to do exercises more.  feels like she's a little worse.  pain in forearm on Rt side has been really aggravated today.   Patient Stated Goals (P)  Decrease the pain in both UEs  [facility] Adult PT Treatment/Exercise - 12/23/16 0835      Exercises   Exercises Wrist     Neck Exercises: Seated   Other Seated Exercise scapular retraction x 10 reps     Wrist Exercises   Wrist Flexion Limitations wrist flex stretch Rt 3x30 sec   Wrist Extension Limitations wrist ext stretch 3x30 sec; Rt     Manual Therapy   Manual Therapy Soft tissue mobilization;Myofascial release   Manual therapy comments pt supine   Soft tissue mobilization Rt wrist extensors   Myofascial Release Rt wrist extensors          Trigger Point Dry Needling - 12/23/16 0839    Consent Given? Yes   Education Handout Provided Yes   Muscles Treated Upper Body --  wrist extensors Rt: twitch  responses and increased m. length              PT Education - 12/23/16 0842    Education provided Yes   Education Details TDN   Person(s) Educated Patient   Methods Explanation;Handout   Comprehension Verbalized understanding          PT Short Term Goals - 12/16/16 0926      PT SHORT TERM GOAL #1   Title The patient will demonstrate a good understanding of modifications for work/home to support posture and correct wrist positioning   Time 2   Period Weeks   Status New   Target Date 12/30/16     PT SHORT TERM GOAL #2   Title pt will be independent with initial HEP   Time 2   Period Weeks   Status New   Target Date 12/30/16           PT Long Term Goals - 12/16/16 0927      PT LONG TERM GOAL #1   Title The patient will be independent in safe self progression of HEP needed for further improvements in ROM and strength, function and decreased pain   Time 8   Period Weeks   Status New   Target Date 02/10/17     PT LONG TERM GOAL #2   Title The patient will improve bil UE AROM to painfree    Time 8   Period Weeks   Status New   Target Date 02/10/17     PT LONG TERM GOAL #3   Title the patient will improve bil elbow and wrist strength to 4+/5 or greater   Time 8   Period Weeks   Status New   Target Date 02/10/17     PT LONG TERM GOAL #4   Title Periscapular muscle strength grossly 4+/5 needed for greater ease with lifting   Time 8   Period Weeks   Status New   Target Date 02/10/17     PT LONG TERM GOAL #5   Title FOTO functional outcome improved to 30% indicating improved function with less pain    Time 8   Period Weeks   Status New   Target Date 02/10/17               Plan - 12/23/16 0842    Clinical Impression Statement Pt arrived today requesting DN to Rt wrist extensors due to increased pain.  Good twitch responses with decreased pain and soreness following DN and manual therapy.  Will need to assess shoulder and neck as able.  No  goals met as only 2nd visit.   PT Treatment/Interventions ADLs/Self Care Home Management;Electrical Stimulation;Cryotherapy;Moist Heat;Traction;Therapeutic  exercise;Therapeutic activities;Neuromuscular re-education;Patient/family education;Manual techniques;Taping;Dry needling   PT Next Visit Plan complete assessment as needed (shoulder mobility and strength, palpation findings, neural tension) review HEP; begin plan for R medial epi and L thumb/UE pain and tightness; assess response to needling   Consulted and Agree with Plan of Care Patient      Patient will benefit from skilled therapeutic intervention in order to improve the following deficits and impairments:  Decreased activity tolerance, Decreased range of motion, Decreased strength, Postural dysfunction, Improper body mechanics, Pain, Impaired UE functional use, Increased muscle spasms  Visit Diagnosis: Pain in right elbow  Thumb pain, left  Muscle weakness (generalized)  Abnormal posture     Problem List Patient Active Problem List   Diagnosis Date Noted  . DJD (degenerative joint disease) of knee 03/30/2013  . Unspecified sleep apnea 03/23/2013  . 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 Weakness 11/28/2011  . Hypertension 11/28/2011  . Hypokalemia 11/28/2011  . LUE weakness 11/28/2011  . Hypothyroidism 11/28/2011  . Breast cancer ([place]) 06/19/2011      Laureen Abrahams, PT, DPT 12/23/16 8:44 AM    [facility] [address] [place], [place], [address] Phone: [phone]   Fax:  [phone]  Name: [name] MRN: [id number] Date of Birth: August 06, 1950

## 2016-12-26 ENCOUNTER — Encounter: Payer: Self-pay | Admitting: Physical Therapy

## 2016-12-26 ENCOUNTER — Ambulatory Visit: Payer: 59 | Admitting: Physical Therapy

## 2016-12-26 DIAGNOSIS — M79645 Pain in left finger(s): Secondary | ICD-10-CM

## 2016-12-26 DIAGNOSIS — M25521 Pain in right elbow: Secondary | ICD-10-CM | POA: Diagnosis not present

## 2016-12-26 DIAGNOSIS — M6281 Muscle weakness (generalized): Secondary | ICD-10-CM | POA: Diagnosis not present

## 2016-12-26 DIAGNOSIS — R293 Abnormal posture: Secondary | ICD-10-CM | POA: Diagnosis not present

## 2016-12-26 NOTE — Therapy (Signed)
[facility] [address] [place], [place], [address] Phone: [phone]   Fax:  [phone]  Physical Therapy Treatment  Patient Details  Name: [name] MRN: [id number] Date of Birth: 1951-02-05 Referring Provider: Dr. Edmonia Lynch  Encounter Date: 12/26/2016      PT End of Session - 12/26/16 0803    Visit Number 3   Number of Visits 10   Date for PT Re-Evaluation 01/20/17   Authorization Type Medicare G codes;  KX at visit 15   PT Start Time 0758   PT Stop Time 0900   PT Time Calculation (min) 62 min   Activity Tolerance Patient tolerated treatment well   Behavior During Therapy [facility] for tasks assessed/performed      Past Medical History:  Diagnosis Date  . Arthritis   . Breast cancer ([place])   . Bruises easily   . Cancer [facility]) 2004   rt breast;takes Arimidex daily;thyroid cancer  . History of bronchitis    when she was young  . Hypertension    takes Tribenzor daily  . Hypothyroidism    takes Synthroid daily  . Joint pain   . Joint swelling   . Personal history of chemotherapy 04/2003  . Personal history of radiation therapy 2005  . Pneumonia    hx of when she was young  . Sleep apnea    has cpap but doesn't use;sleep study done 3-89yrs ago  . Urinary frequency     Past Surgical History:  Procedure Laterality Date  . ANTERIOR CERVICAL DECOMP/DISCECTOMY FUSION  2008  . APPENDECTOMY  2007  . BREAST BIOPSY Right 02/28/2003   malignant  . BREAST LUMPECTOMY  2004  . CESAREAN SECTION  36yrs ago  . COLONOSCOPY    . COLONOSCOPY    . KNEE ARTHROSCOPY  2007  . KNEE ARTHROSCOPY  05/02/2011   Procedure: ARTHROSCOPY KNEE;  Surgeon: Johnn Hai;  Location: [place];  Service: Orthopedics;  Laterality: Right;  . MENISCUS DEBRIDEMENT  05/02/2011   Procedure: DEBRIDEMENT OF MENISCUS;  Surgeon: Johnn Hai;  Location: [place];  Service: Orthopedics;   Laterality: Right;  . porta cath remove  2005  . PORTACATH PLACEMENT  2004  . portacath removal    . THYROID LOBECTOMY Right 09/22/2012   Dr Constance Holster  . THYROIDECTOMY N/A 09/22/2012   Procedure: RIGHT THYROID LOBECTOMY WITH FROZEN SECTION;  Surgeon: Izora Gala, MD;  Location: [place];  Service: ENT;  Laterality: N/A;  . THYROIDECTOMY Left 10/21/2012   Procedure: COMPLETION OF THYROIDECTOMY;  Surgeon: Izora Gala, MD;  Location: [place];  Service: ENT;  Laterality: Left;  . TOTAL KNEE ARTHROPLASTY  04/14/2012   Procedure: TOTAL KNEE ARTHROPLASTY;  Surgeon: Ninetta Lights, MD;  Location: [place];  Service: Orthopedics;  Laterality: Right;  RIGHT ARTHROPLASTY KNEE MEDIAL/LATERAL COMPARTMENTS WITH PATELLA RESURFACING  . TOTAL KNEE ARTHROPLASTY Left 03/30/2013   Procedure: TOTAL KNEE ARTHROPLASTY;  Surgeon: Ninetta Lights, MD;  Location: [place];  Service: Orthopedics;  Laterality: Left;    There were no vitals filed for this visit.      Subjective Assessment - 12/26/16 0805    Subjective Needling felt great to RT wrist ext muscles. Not much pain since it is early and she hasn't done much today.   Pertinent History hx of breast CA;  2 TKRs; HTN;  neck surgery 10 years ago with arm pain.  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     Currently in Pain? No/denies   Multiple Pain Sites No                         [facility] Adult PT Treatment/Exercise - 12/26/16 0001      Wrist Exercises   Other wrist exercises Soft MELT ball hand treatment Bil      Moist Heat Therapy   Number Minutes Moist Heat 15 Minutes   Moist Heat Location --  Bil elbow & forearm     Manual Therapy   Soft tissue mobilization Rt wrist extensor, brachioradialis, biceps  Bil                  PT Short Term Goals - 12/16/16 0926      PT SHORT TERM GOAL #1   Title The patient will demonstrate a good understanding of modifications for work/home to support posture and correct wrist positioning    Time 2   Period Weeks   Status New   Target Date 12/30/16     PT SHORT TERM GOAL #2   Title pt will be independent with initial HEP   Time 2   Period Weeks   Status New   Target Date 12/30/16           PT Long Term Goals - 12/16/16 0927      PT LONG TERM GOAL #1   Title The patient will be independent in safe self progression of HEP needed for further improvements in ROM and strength, function and decreased pain   Time 8   Period Weeks   Status New   Target Date 02/10/17     PT LONG TERM GOAL #2   Title The patient will improve bil UE AROM to painfree    Time 8   Period Weeks   Status New   Target Date 02/10/17     PT LONG TERM GOAL #3   Title the patient will improve bil elbow and wrist strength to 4+/5 or greater   Time 8   Period Weeks   Status New   Target Date 02/10/17     PT LONG TERM GOAL #4   Title Periscapular muscle strength grossly 4+/5 needed for greater ease with lifting   Time 8   Period Weeks   Status New   Target Date 02/10/17     PT LONG TERM GOAL #5   Title FOTO functional outcome improved to 30% indicating improved function with less pain    Time 8   Period Weeks   Status New   Target Date 02/10/17               Plan - 12/26/16 0804    Clinical Impression Statement Pt reposnded very well to dry needling. She presents today with no pain but contributes this just getting up and not doing anything yet for the day. RT forearm and bicep tight muscularly including fascial retrictions. Introduced her to the small MELT Method hydration ball so she can use this to compliment the needling and follow up with at home.    Rehab Potential Good   Clinical Impairments Affecting Rehab Potential bil UE pain   PT Frequency 2x / week   PT Duration 8 weeks   PT Treatment/Interventions ADLs/Self Care Home Management;Electrical Stimulation;Cryotherapy;Moist Heat;Traction;Therapeutic exercise;Therapeutic activities;Neuromuscular  re-education;Patient/family education;Manual techniques;Taping;Dry needling   PT Next Visit Plan Manual techniques, stretching, check shoulder per PT instructions last session  if seen by PT next.    Consulted and Agree with Plan of Care Patient      Patient will benefit from skilled therapeutic intervention in order to improve the following deficits and impairments:  Decreased activity tolerance, Decreased range of motion, Decreased strength, Postural dysfunction, Improper body mechanics, Pain, Impaired UE functional use, Increased muscle spasms  Visit Diagnosis: Pain in right elbow  Thumb pain, left  Muscle weakness (generalized)     Problem List Patient Active Problem List   Diagnosis Date Noted  . DJD (degenerative joint disease) of knee 03/30/2013  . Unspecified sleep apnea 03/23/2013  . 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 Weakness 11/28/2011  . Hypertension 11/28/2011  . Hypokalemia 11/28/2011  . LUE weakness 11/28/2011  . Hypothyroidism 11/28/2011  . Breast cancer ([place]) 06/19/2011    Jaiyanna Safran, PTA 12/26/2016, 8:46 AM  [facility] [address] [place], [place], [address] Phone: [phone]   Fax:  [phone]  Name: MARYLN EASTHAM MRN: [id number] Date of Birth: 02-25-1951

## 2016-12-26 NOTE — Patient Instructions (Signed)
Small blue ball: Web site is [email or web address] Look under the heading of hydration balls. Look for bag of blue looking balls.   Massage therapist; At [employer] is Apolonio Schneiders. She is THE BEST! I recommend starting with 30 min work on your right & left arms/hands/shoulders  Owner is Consuella Lose. Speak with her about doing a corporate event at your work. Tell her Anderson Malta from [employer] sent you.

## 2017-01-01 ENCOUNTER — Ambulatory Visit: Payer: 59 | Attending: Orthopedic Surgery | Admitting: Physical Therapy

## 2017-01-01 ENCOUNTER — Encounter: Payer: Self-pay | Admitting: Physical Therapy

## 2017-01-01 DIAGNOSIS — M25521 Pain in right elbow: Secondary | ICD-10-CM | POA: Insufficient documentation

## 2017-01-01 DIAGNOSIS — M79645 Pain in left finger(s): Secondary | ICD-10-CM | POA: Diagnosis not present

## 2017-01-01 DIAGNOSIS — M6281 Muscle weakness (generalized): Secondary | ICD-10-CM | POA: Insufficient documentation

## 2017-01-01 DIAGNOSIS — R293 Abnormal posture: Secondary | ICD-10-CM | POA: Insufficient documentation

## 2017-01-01 NOTE — Therapy (Signed)
[facility] [address], [place], [place], [address] Phone: [phone]   Fax:  [phone]  Physical Therapy Treatment  Patient Details  Name: [name] MRN: [id number] Date of Birth: 1950-10-01 Referring Provider: Dr. Edmonia Lynch  Encounter Date: 01/01/2017      PT End of Session - 01/01/17 0802    Visit Number 4   Number of Visits 10   Date for PT Re-Evaluation 01/20/17   Authorization Type Medicare G codes;  KX at visit 15   PT Start Time 0802   PT Stop Time 0845   PT Time Calculation (min) 43 min   Activity Tolerance Patient tolerated treatment well   Behavior During Therapy [facility] for tasks assessed/performed      Past Medical History:  Diagnosis Date  . Arthritis   . Breast cancer ([place])   . Bruises easily   . Cancer [facility]) 2004   rt breast;takes Arimidex daily;thyroid cancer  . History of bronchitis    when she was young  . Hypertension    takes Tribenzor daily  . Hypothyroidism    takes Synthroid daily  . Joint pain   . Joint swelling   . Personal history of chemotherapy 04/2003  . Personal history of radiation therapy 2005  . Pneumonia    hx of when she was young  . Sleep apnea    has cpap but doesn't use;sleep study done 3-27yrs ago  . Urinary frequency     Past Surgical History:  Procedure Laterality Date  . ANTERIOR CERVICAL DECOMP/DISCECTOMY FUSION  2008  . APPENDECTOMY  2007  . BREAST BIOPSY Right 02/28/2003   malignant  . BREAST LUMPECTOMY  2004  . CESAREAN SECTION  51yrs ago  . COLONOSCOPY    . COLONOSCOPY    . KNEE ARTHROSCOPY  2007  . KNEE ARTHROSCOPY  05/02/2011   Procedure: ARTHROSCOPY KNEE;  Surgeon: Johnn Hai;  Location: [place];  Service: Orthopedics;  Laterality: Right;  . MENISCUS DEBRIDEMENT  05/02/2011   Procedure: DEBRIDEMENT OF MENISCUS;  Surgeon: Johnn Hai;  Location: [place];  Service: Orthopedics;   Laterality: Right;  . porta cath remove  2005  . PORTACATH PLACEMENT  2004  . portacath removal    . THYROID LOBECTOMY Right 09/22/2012   Dr Constance Holster  . THYROIDECTOMY N/A 09/22/2012   Procedure: RIGHT THYROID LOBECTOMY WITH FROZEN SECTION;  Surgeon: Izora Gala, MD;  Location: [place];  Service: ENT;  Laterality: N/A;  . THYROIDECTOMY Left 10/21/2012   Procedure: COMPLETION OF THYROIDECTOMY;  Surgeon: Izora Gala, MD;  Location: [place];  Service: ENT;  Laterality: Left;  . TOTAL KNEE ARTHROPLASTY  04/14/2012   Procedure: TOTAL KNEE ARTHROPLASTY;  Surgeon: Ninetta Lights, MD;  Location: [place];  Service: Orthopedics;  Laterality: Right;  RIGHT ARTHROPLASTY KNEE MEDIAL/LATERAL COMPARTMENTS WITH PATELLA RESURFACING  . TOTAL KNEE ARTHROPLASTY Left 03/30/2013   Procedure: TOTAL KNEE ARTHROPLASTY;  Surgeon: Ninetta Lights, MD;  Location: [place];  Service: Orthopedics;  Laterality: Left;    There were no vitals filed for this visit.      Subjective Assessment - 01/01/17 0806    Subjective Feeling better, just some pain medial elbow.   Pertinent History hx of breast CA;  2 TKRs; HTN;  neck surgery 10 years ago with arm pain.  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     Limitations Lifting   Diagnostic tests  MRI;  Xray;  will do another MRI of another part of back   Patient Stated Goals Decrease the pain in both UEs   Currently in Pain? Yes   Pain Score --  no number given, moderate   Pain Location Elbow   Pain Orientation Right   Pain Descriptors / Indicators Aching   Pain Type Chronic pain   Pain Onset More than a month ago   Pain Frequency Intermittent   Aggravating Factors  typing   Pain Relieving Factors rest   Multiple Pain Sites No            [facility] PT Assessment - 01/01/17 0001      AROM   Overall AROM Comments Rt shoulder 5/5                     [facility] Adult PT Treatment/Exercise - 01/01/17 0001      Elbow Exercises   Wrist Extension Limitations wrist  flexor stretch one digit at a time, all fingers elbow bent and straight, using mat for flexor and extensor stretches - 3x20 sec each     Neck Exercises: Standing   Other Standing Exercises UE flexion to 90 with cues for scap position and upright posture - 20x     Manual Therapy   Soft tissue mobilization wrist flexors and extensors Rt UE                PT Education - 01/01/17 0846    Education provided Yes   Education Details stretches   Person(s) Educated Patient   Methods Explanation;Demonstration;Verbal cues;Handout   Comprehension Verbalized understanding;Returned demonstration          PT Short Term Goals - 12/16/16 0926      PT SHORT TERM GOAL #1   Title The patient will demonstrate a good understanding of modifications for work/home to support posture and correct wrist positioning   Time 2   Period Weeks   Status New   Target Date 12/30/16     PT SHORT TERM GOAL #2   Title pt will be independent with initial HEP   Time 2   Period Weeks   Status New   Target Date 12/30/16           PT Long Term Goals - 01/01/17 0900      PT LONG TERM GOAL #1   Title The patient will be independent in safe self progression of HEP needed for further improvements in ROM and strength, function and decreased pain   Time 8   Period Weeks   Status On-going     PT LONG TERM GOAL #2   Title The patient will improve bil UE AROM to painfree    Time 8   Period Weeks   Status On-going     PT LONG TERM GOAL #3   Title the patient will improve bil elbow and wrist strength to 4+/5 or greater   Time 8   Period Weeks   Status On-going     PT LONG TERM GOAL #4   Title Periscapular muscle strength grossly 4+/5 needed for greater ease with lifting   Time 8   Period Weeks   Status On-going     PT LONG TERM GOAL #5   Title FOTO functional outcome improved to 30% indicating improved function with less pain    Time 8   Period Weeks   Status On-going  Plan - 01/01/17 0802    Clinical Impression Statement Patient has not had any shoulder/arm issues since last week and strength was 5/5 MMT without pain.  Pt had some muscle tightness along wrist flexors of the Right UE at attachments by medial epicondyle.  Pt responded well to soft tissue work and reviewed stretches in different positions for wrist flexors/extensors.  Pt was educated on upper body, shoulder girdle and hand position that is ideal when typing.  Pt continues to need skilled PT for postural strengthening and awareness and work on increased soft tissue length.    Rehab Potential Good   Clinical Impairments Affecting Rehab Potential bil UE pain   PT Treatment/Interventions ADLs/Self Care Home Management;Electrical Stimulation;Cryotherapy;Moist Heat;Traction;Therapeutic exercise;Therapeutic activities;Neuromuscular re-education;Patient/family education;Manual techniques;Taping;Dry needling   PT Next Visit Plan Manual techniques, stretching, posutre strength and lifting mechanics   Consulted and Agree with Plan of Care Patient      Patient will benefit from skilled therapeutic intervention in order to improve the following deficits and impairments:  Decreased activity tolerance, Decreased range of motion, Decreased strength, Postural dysfunction, Improper body mechanics, Pain, Impaired UE functional use, Increased muscle spasms  Visit Diagnosis: Pain in right elbow  Thumb pain, left  Muscle weakness (generalized)     Problem List Patient Active Problem List   Diagnosis Date Noted  . DJD (degenerative joint disease) of knee 03/30/2013  . Unspecified sleep apnea 03/23/2013  . 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 Weakness 11/28/2011  . Hypertension 11/28/2011  . Hypokalemia 11/28/2011  . LUE weakness 11/28/2011  . Hypothyroidism 11/28/2011  . Breast cancer ([place]) 06/19/2011    Zannie Cove, PT 01/01/2017, 9:02 AM  [facility] [address] [place], [place], [address] Phone: [phone]   Fax:  [phone]  Name: [name] MRN: [id number] Date of Birth: 11/04/1950

## 2017-01-01 NOTE — Patient Instructions (Signed)
   Standing Wrist Flexor Stretch  Place your fingers and palms on a table or counter and while keeping your elbows straight lean back until a moderate stretch is felt in your forearms.  Hold this stretch for 1 minute, relax, then repeat.    Do all fingers and one at a time holding each finger for 10 sec 3x each

## 2017-01-02 ENCOUNTER — Ambulatory Visit: Payer: 59 | Admitting: Physical Therapy

## 2017-01-02 DIAGNOSIS — M79645 Pain in left finger(s): Secondary | ICD-10-CM | POA: Diagnosis not present

## 2017-01-02 DIAGNOSIS — M6281 Muscle weakness (generalized): Secondary | ICD-10-CM

## 2017-01-02 DIAGNOSIS — M25521 Pain in right elbow: Secondary | ICD-10-CM

## 2017-01-02 DIAGNOSIS — R293 Abnormal posture: Secondary | ICD-10-CM

## 2017-01-02 NOTE — Therapy (Signed)
[facility] [address] [place], [place], [address] Phone: [phone]   Fax:  [phone]  Physical Therapy Treatment  Patient Details  Name: [name] MRN: [id number] Date of Birth: 02-07-1951 Referring Provider: Dr. Edmonia Lynch  Encounter Date: 01/02/2017      PT End of Session - 01/02/17 0830    Visit Number 5   Number of Visits 10   Date for PT Re-Evaluation 01/20/17   Authorization Type Medicare G codes;  KX at visit 15   PT Start Time 0802   PT Stop Time 0843   PT Time Calculation (min) 41 min   Activity Tolerance Patient tolerated treatment well   Behavior During Therapy [facility] for tasks assessed/performed      Past Medical History:  Diagnosis Date  . Arthritis   . Breast cancer ([place])   . Bruises easily   . Cancer [facility]) 2004   rt breast;takes Arimidex daily;thyroid cancer  . History of bronchitis    when she was young  . Hypertension    takes Tribenzor daily  . Hypothyroidism    takes Synthroid daily  . Joint pain   . Joint swelling   . Personal history of chemotherapy 04/2003  . Personal history of radiation therapy 2005  . Pneumonia    hx of when she was young  . Sleep apnea    has cpap but doesn't use;sleep study done 3-25yrs ago  . Urinary frequency     Past Surgical History:  Procedure Laterality Date  . ANTERIOR CERVICAL DECOMP/DISCECTOMY FUSION  2008  . APPENDECTOMY  2007  . BREAST BIOPSY Right 02/28/2003   malignant  . BREAST LUMPECTOMY  2004  . CESAREAN SECTION  61yrs ago  . COLONOSCOPY    . COLONOSCOPY    . KNEE ARTHROSCOPY  2007  . KNEE ARTHROSCOPY  05/02/2011   Procedure: ARTHROSCOPY KNEE;  Surgeon: Johnn Hai;  Location: [place];  Service: Orthopedics;  Laterality: Right;  . MENISCUS DEBRIDEMENT  05/02/2011   Procedure: DEBRIDEMENT OF MENISCUS;  Surgeon: Johnn Hai;  Location: [place];  Service: Orthopedics;   Laterality: Right;  . porta cath remove  2005  . PORTACATH PLACEMENT  2004  . portacath removal    . THYROID LOBECTOMY Right 09/22/2012   Dr Constance Holster  . THYROIDECTOMY N/A 09/22/2012   Procedure: RIGHT THYROID LOBECTOMY WITH FROZEN SECTION;  Surgeon: Izora Gala, MD;  Location: [place];  Service: ENT;  Laterality: N/A;  . THYROIDECTOMY Left 10/21/2012   Procedure: COMPLETION OF THYROIDECTOMY;  Surgeon: Izora Gala, MD;  Location: [place];  Service: ENT;  Laterality: Left;  . TOTAL KNEE ARTHROPLASTY  04/14/2012   Procedure: TOTAL KNEE ARTHROPLASTY;  Surgeon: Ninetta Lights, MD;  Location: [place];  Service: Orthopedics;  Laterality: Right;  RIGHT ARTHROPLASTY KNEE MEDIAL/LATERAL COMPARTMENTS WITH PATELLA RESURFACING  . TOTAL KNEE ARTHROPLASTY Left 03/30/2013   Procedure: TOTAL KNEE ARTHROPLASTY;  Surgeon: Ninetta Lights, MD;  Location: [place];  Service: Orthopedics;  Laterality: Left;    There were no vitals filed for this visit.      Subjective Assessment - 01/02/17 0805    Subjective Pt reports that today she is doing well. She does feel that her Lt hand is bothering her mostly at the moment and up to the forearm. She does have a new desk which she feels is helping with some adjustments.    Pertinent History hx of breast CA;  2 TKRs; HTN;  neck surgery 10 years ago with arm pain.  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     Limitations Lifting   Diagnostic tests MRI;  Xray;  will do another MRI of another part of back   Patient Stated Goals Decrease the pain in both UEs   Currently in Pain? No/denies   Pain Onset More than a month ago           [facility] Adult PT Treatment/Exercise - 01/02/17 0001      Wrist Exercises   Other wrist exercises Eccentric Lt wrist extension with 1# dumbbell 2x20 reps    Other wrist exercises Lt wrist extensor stretch 10x10 sec hold      Manual Therapy   Joint Mobilization distal radiounar mobilization, LUE   Soft tissue mobilization Lt wrist extensors              PT Education - 01/02/17 0837    Education provided Yes   Education Details discussed adjustments to key board and stand up desk to improve wrist/elbow alignment; updated HEP   Person(s) Educated Patient   Methods Explanation;Verbal cues;Handout   Comprehension Returned demonstration;Verbalized understanding          PT Short Term Goals - 12/16/16 0926      PT SHORT TERM GOAL #1   Title The patient will demonstrate a good understanding of modifications for work/home to support posture and correct wrist positioning   Time 2   Period Weeks   Status New   Target Date 12/30/16     PT SHORT TERM GOAL #2   Title pt will be independent with initial HEP   Time 2   Period Weeks   Status New   Target Date 12/30/16           PT Long Term Goals - 01/01/17 0900      PT LONG TERM GOAL #1   Title The patient will be independent in safe self progression of HEP needed for further improvements in ROM and strength, function and decreased pain   Time 8   Period Weeks   Status On-going     PT LONG TERM GOAL #2   Title The patient will improve bil UE AROM to painfree    Time 8   Period Weeks   Status On-going     PT LONG TERM GOAL #3   Title the patient will improve bil elbow and wrist strength to 4+/5 or greater   Time 8   Period Weeks   Status On-going     PT LONG TERM GOAL #4   Title Periscapular muscle strength grossly 4+/5 needed for greater ease with lifting   Time 8   Period Weeks   Status On-going     PT LONG TERM GOAL #5   Title FOTO functional outcome improved to 30% indicating improved function with less pain    Time 8   Period Weeks   Status On-going               Plan - 01/02/17 2993    Clinical Impression Statement Pt reporting LUE and hand irritation lately. Therapist noting increased fascial/muscle tension throughout the wrist extensor group and spent a large portion of the session completing soft tissue techniques to improve  this. Ended session with additions to her HEP including gentle wrist extensor stretch. Pt reported significan improvements in Lt forearm stiffness following today's session. Will consider dry needling treatment next  session to further address this.    Rehab Potential Good   Clinical Impairments Affecting Rehab Potential bil UE pain   PT Treatment/Interventions ADLs/Self Care Home Management;Electrical Stimulation;Cryotherapy;Moist Heat;Traction;Therapeutic exercise;Therapeutic activities;Neuromuscular re-education;Patient/family education;Manual techniques;Taping;Dry needling   PT Next Visit Plan Manual techniques and dry needling Lt wrist extensors, stretching, posutre strength and lifting mechanics   Consulted and Agree with Plan of Care Patient      Patient will benefit from skilled therapeutic intervention in order to improve the following deficits and impairments:  Decreased activity tolerance, Decreased range of motion, Decreased strength, Postural dysfunction, Improper body mechanics, Pain, Impaired UE functional use, Increased muscle spasms  Visit Diagnosis: Pain in right elbow  Thumb pain, left  Muscle weakness (generalized)  Abnormal posture    Problem List Patient Active Problem List   Diagnosis Date Noted  . DJD (degenerative joint disease) of knee 03/30/2013  . Unspecified sleep apnea 03/23/2013  . 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 Weakness 11/28/2011  . Hypertension 11/28/2011  . Hypokalemia 11/28/2011  . LUE weakness 11/28/2011  . Hypothyroidism 11/28/2011  . Breast cancer ([place]) 06/19/2011    8:55 AM,01/02/17 Elly Modena PT, DPT [facility] [address] [place], [place], [address] Phone: [phone]   Fax:  [phone]  Name: [name] MRN: [id number] Date of Birth: 02-Feb-1951

## 2017-01-06 ENCOUNTER — Ambulatory Visit: Payer: 59 | Admitting: Physical Therapy

## 2017-01-06 DIAGNOSIS — M6281 Muscle weakness (generalized): Secondary | ICD-10-CM

## 2017-01-06 DIAGNOSIS — M25521 Pain in right elbow: Secondary | ICD-10-CM | POA: Diagnosis not present

## 2017-01-06 DIAGNOSIS — R293 Abnormal posture: Secondary | ICD-10-CM | POA: Diagnosis not present

## 2017-01-06 DIAGNOSIS — M79645 Pain in left finger(s): Secondary | ICD-10-CM | POA: Diagnosis not present

## 2017-01-06 NOTE — Patient Instructions (Signed)
[name] PT [facility] [address], [address] [place], [place] [address] Phone # [phone] Fax [phone]    

## 2017-01-06 NOTE — Therapy (Signed)
[facility] [address], [place], [place], [address] Phone: [phone]   Fax:  [phone]  Physical Therapy Treatment  Patient Details  Name: [name] MRN: [id number] Date of Birth: Feb 15, 1951 Referring Provider: Dr. Edmonia Lynch  Encounter Date: 01/06/2017      PT End of Session - 01/06/17 0953    Visit Number 6   Number of Visits 10   Date for PT Re-Evaluation 01/20/17   Authorization Type Medicare G codes;  KX at visit 15   PT Start Time 0800   PT Stop Time 0845   PT Time Calculation (min) 45 min   Activity Tolerance Patient tolerated treatment well      Past Medical History:  Diagnosis Date  . Arthritis   . Breast cancer ([place])   . Bruises easily   . Cancer [facility]) 2004   rt breast;takes Arimidex daily;thyroid cancer  . History of bronchitis    when she was young  . Hypertension    takes Tribenzor daily  . Hypothyroidism    takes Synthroid daily  . Joint pain   . Joint swelling   . Personal history of chemotherapy 04/2003  . Personal history of radiation therapy 2005  . Pneumonia    hx of when she was young  . Sleep apnea    has cpap but doesn't use;sleep study done 3-6yrs ago  . Urinary frequency     Past Surgical History:  Procedure Laterality Date  . ANTERIOR CERVICAL DECOMP/DISCECTOMY FUSION  2008  . APPENDECTOMY  2007  . BREAST BIOPSY Right 02/28/2003   malignant  . BREAST LUMPECTOMY  2004  . CESAREAN SECTION  35yrs ago  . COLONOSCOPY    . COLONOSCOPY    . KNEE ARTHROSCOPY  2007  . KNEE ARTHROSCOPY  05/02/2011   Procedure: ARTHROSCOPY KNEE;  Surgeon: Johnn Hai;  Location: [place];  Service: Orthopedics;  Laterality: Right;  . MENISCUS DEBRIDEMENT  05/02/2011   Procedure: DEBRIDEMENT OF MENISCUS;  Surgeon: Johnn Hai;  Location: [place];  Service: Orthopedics;  Laterality: Right;  . porta cath remove  2005  . PORTACATH  PLACEMENT  2004  . portacath removal    . THYROID LOBECTOMY Right 09/22/2012   Dr Constance Holster  . THYROIDECTOMY N/A 09/22/2012   Procedure: RIGHT THYROID LOBECTOMY WITH FROZEN SECTION;  Surgeon: Izora Gala, MD;  Location: [place];  Service: ENT;  Laterality: N/A;  . THYROIDECTOMY Left 10/21/2012   Procedure: COMPLETION OF THYROIDECTOMY;  Surgeon: Izora Gala, MD;  Location: [place];  Service: ENT;  Laterality: Left;  . TOTAL KNEE ARTHROPLASTY  04/14/2012   Procedure: TOTAL KNEE ARTHROPLASTY;  Surgeon: Ninetta Lights, MD;  Location: [place];  Service: Orthopedics;  Laterality: Right;  RIGHT ARTHROPLASTY KNEE MEDIAL/LATERAL COMPARTMENTS WITH PATELLA RESURFACING  . TOTAL KNEE ARTHROPLASTY Left 03/30/2013   Procedure: TOTAL KNEE ARTHROPLASTY;  Surgeon: Ninetta Lights, MD;  Location: [place];  Service: Orthopedics;  Laterality: Left;    There were no vitals filed for this visit.      Subjective Assessment - 01/06/17 0804    Subjective (P)  I'm OK.  Yesterday was really rough though b/c I was lifting a lot this past weekend.  Normally medial right elbow but yesterday it was right upper arm as well.  Patient has a dark skin area on both elbow from her self application of the heat.  Patient states I tend to bruise earier.  Pertinent History (P)  hx of breast CA;  2 TKRs; HTN;  neck surgery 10 years ago with arm pain.  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     Currently in Pain? (P)  Yes   Pain Location (P)  Elbow   Pain Orientation (P)  Right   Pain Type (P)  Chronic pain                         [facility] Adult PT Treatment/Exercise - 01/06/17 0001      Self-Care   Other Self-Care Comments  discussion on elbow brace (medial and lateral epicondylagia and how to wear     Moist Heat Therapy   Number Minutes Moist Heat 10 Minutes   Moist Heat Location --  right forearm while discussing self care     Manual Therapy   Soft tissue mobilization right wrist flexors, extensors,  pronators, biceps and triceps   Myofascial Release Graston myofascial  G2 sweeping, strumming          Trigger Point Dry Needling - 01/06/17 0953    Consent Given? Yes   Muscles Treated Upper Body --  right flexor carpi radialis, ulnaris, pronator teres,ECRB                PT Short Term Goals - 01/06/17 1001      PT SHORT TERM GOAL #1   Title The patient will demonstrate a good understanding of modifications for work/home to support posture and correct wrist positioning   Status Achieved     PT SHORT TERM GOAL #2   Title pt will be independent with initial HEP   Status Achieved           PT Long Term Goals - 01/06/17 1001      PT LONG TERM GOAL #1   Title The patient will be independent in safe self progression of HEP needed for further improvements in ROM and strength, function and decreased pain   Time 8   Period Weeks   Status On-going     PT LONG TERM GOAL #2   Title The patient will improve bil UE AROM to painfree    Time 8   Period Weeks   Status On-going     PT LONG TERM GOAL #3   Title the patient will improve bil elbow and wrist strength to 4+/5 or greater   Time 8   Period Weeks   Status On-going     PT LONG TERM GOAL #4   Title Periscapular muscle strength grossly 4+/5 needed for greater ease with lifting   Time 8   Period Weeks   Status On-going     PT LONG TERM GOAL #5   Title FOTO functional outcome improved to 30% indicating improved function with less pain    Time 8   Period Weeks   Status On-going               Plan - 01/06/17 0953    Clinical Impression Statement The patient's main complaint today is right medial and lateral elbow pain following lifting over the weekend.  Continued tender points on the wrist flexors and new tender points on wrist extensor muscle wad.  Following dry needling, manual and Graston she reports good pain relief and states she plans to cancel her deep tissue massage she had scheduled later  today.     Rehab Potential Good   Clinical Impairments  Affecting Rehab Potential bil UE pain   PT Frequency 2x / week   PT Duration 8 weeks   PT Next Visit Plan Manual techniques and dry needling right wrist extensors and flexors;  may benefit from DN to left opponens pollicis;  see if patient got elbow brace      Patient will benefit from skilled therapeutic intervention in order to improve the following deficits and impairments:  Decreased activity tolerance, Decreased range of motion, Decreased strength, Postural dysfunction, Improper body mechanics, Pain, Impaired UE functional use, Increased muscle spasms  Visit Diagnosis: Pain in right elbow  Muscle weakness (generalized)     Problem List Patient Active Problem List   Diagnosis Date Noted  . DJD (degenerative joint disease) of knee 03/30/2013  . Unspecified sleep apnea 03/23/2013  . 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 Weakness 11/28/2011  . Hypertension 11/28/2011  . Hypokalemia 11/28/2011  . LUE weakness 11/28/2011  . Hypothyroidism 11/28/2011  . Breast cancer ([place]) 06/19/2011   Ruben Im, PT 01/06/17 10:03 AM Phone: [phone] Fax: [phone]  Alvera Singh 01/06/2017, 10:02 AM  [facility] [address] [place], [place], [address] Phone: [phone]   Fax:  [phone]  Name: [name] MRN: [id number] Date of Birth: [birth year]

## 2017-01-08 ENCOUNTER — Ambulatory Visit: Payer: 59 | Admitting: Physical Therapy

## 2017-01-08 DIAGNOSIS — M79645 Pain in left finger(s): Secondary | ICD-10-CM | POA: Diagnosis not present

## 2017-01-08 DIAGNOSIS — M6281 Muscle weakness (generalized): Secondary | ICD-10-CM | POA: Diagnosis not present

## 2017-01-08 DIAGNOSIS — R293 Abnormal posture: Secondary | ICD-10-CM | POA: Diagnosis not present

## 2017-01-08 DIAGNOSIS — M25521 Pain in right elbow: Secondary | ICD-10-CM

## 2017-01-08 NOTE — Therapy (Signed)
[facility] [address] [place], [place], [address] Phone: [phone]   Fax:  [phone]  Physical Therapy Treatment  Patient Details  Name: [name] MRN: [id number] Date of Birth: 06-Apr-1951 Referring Provider: Dr. Edmonia Lynch  Encounter Date: 01/08/2017      PT End of Session - 01/08/17 1729    Visit Number 7   Number of Visits 10   Date for PT Re-Evaluation 01/20/17   Authorization Type Medicare G codes;  KX at visit 15   PT Start Time 0802   PT Stop Time 0845   PT Time Calculation (min) 43 min   Activity Tolerance Patient tolerated treatment well      Past Medical History:  Diagnosis Date  . Arthritis   . Breast cancer ([place])   . Bruises easily   . Cancer [facility]) 2004   rt breast;takes Arimidex daily;thyroid cancer  . History of bronchitis    when she was young  . Hypertension    takes Tribenzor daily  . Hypothyroidism    takes Synthroid daily  . Joint pain   . Joint swelling   . Personal history of chemotherapy 04/2003  . Personal history of radiation therapy 2005  . Pneumonia    hx of when she was young  . Sleep apnea    has cpap but doesn't use;sleep study done 3-70yrs ago  . Urinary frequency     Past Surgical History:  Procedure Laterality Date  . ANTERIOR CERVICAL DECOMP/DISCECTOMY FUSION  2008  . APPENDECTOMY  2007  . BREAST BIOPSY Right 02/28/2003   malignant  . BREAST LUMPECTOMY  2004  . CESAREAN SECTION  68yrs ago  . COLONOSCOPY    . COLONOSCOPY    . KNEE ARTHROSCOPY  2007  . KNEE ARTHROSCOPY  05/02/2011   Procedure: ARTHROSCOPY KNEE;  Surgeon: Johnn Hai;  Location: [place];  Service: Orthopedics;  Laterality: Right;  . MENISCUS DEBRIDEMENT  05/02/2011   Procedure: DEBRIDEMENT OF MENISCUS;  Surgeon: Johnn Hai;  Location: [place];  Service: Orthopedics;  Laterality: Right;  . porta cath remove  2005  . PORTACATH  PLACEMENT  2004  . portacath removal    . THYROID LOBECTOMY Right 09/22/2012   Dr Constance Holster  . THYROIDECTOMY N/A 09/22/2012   Procedure: RIGHT THYROID LOBECTOMY WITH FROZEN SECTION;  Surgeon: Izora Gala, MD;  Location: [place];  Service: ENT;  Laterality: N/A;  . THYROIDECTOMY Left 10/21/2012   Procedure: COMPLETION OF THYROIDECTOMY;  Surgeon: Izora Gala, MD;  Location: [place];  Service: ENT;  Laterality: Left;  . TOTAL KNEE ARTHROPLASTY  04/14/2012   Procedure: TOTAL KNEE ARTHROPLASTY;  Surgeon: Ninetta Lights, MD;  Location: [place];  Service: Orthopedics;  Laterality: Right;  RIGHT ARTHROPLASTY KNEE MEDIAL/LATERAL COMPARTMENTS WITH PATELLA RESURFACING  . TOTAL KNEE ARTHROPLASTY Left 03/30/2013   Procedure: TOTAL KNEE ARTHROPLASTY;  Surgeon: Ninetta Lights, MD;  Location: [place];  Service: Orthopedics;  Laterality: Left;    There were no vitals filed for this visit.      Subjective Assessment - 01/08/17 0804    Subjective Yesterday my arm felt really good.  I don't feel the upper arm pain.  Only feel medial elbow discomfort with pronation and supination.  I ordered a tennis elbow brace on [place].  I also ordered 1# weights.     Currently in Pain? No/denies   Pain Score 0-No pain   Pain Location Elbow  Pain Orientation Right                         [facility] Adult PT Treatment/Exercise - 01/08/17 0001      Self-Care   Other Self-Care Comments  discussion on elbow brace;  current HEP and response to treatment      Manual Therapy   Manual Therapy Taping 3  I strips: along wrist flexors, extensors and across forearm   Soft tissue mobilization right wrist flexors, extensors, pronators, biceps and triceps   Myofascial Release Graston myofascial  G2 and G4 sweeping, strumming to right wrist flexors, extensors and triceps                  PT Short Term Goals - 01/08/17 1733      PT SHORT TERM GOAL #1   Title The patient will demonstrate a good understanding of  modifications for work/home to support posture and correct wrist positioning   Status Achieved     PT SHORT TERM GOAL #2   Title pt will be independent with initial HEP   Status Achieved     PT SHORT TERM GOAL #3   Title The patient will have knowledge and demonstrate compliance with initial HEP for core muscle activation   Status Achieved     PT SHORT TERM GOAL #4   Title Thoracic and lumbar extension improved to 25 degrees needed for standing erect   Status Achieved           PT Long Term Goals - 01/08/17 1733      PT LONG TERM GOAL #1   Title The patient will be independent in safe self progression of HEP needed for further improvements in ROM and strength, function and decreased pain   Time 8   Period Weeks   Status On-going     PT LONG TERM GOAL #2   Title The patient will improve bil UE AROM to painfree    Time 8   Period Weeks   Status On-going     PT LONG TERM GOAL #3   Title the patient will improve bil elbow and wrist strength to 4+/5 or greater   Time 8   Period Weeks   Status On-going     PT LONG TERM GOAL #4   Title Periscapular muscle strength grossly 4+/5 needed for greater ease with lifting   Time 8   Period Weeks   Status On-going     PT LONG TERM GOAL #5   Title FOTO functional outcome improved to 30% indicating improved function with less pain    Time 8   Period Weeks   Status On-going               Plan - 01/08/17 1730    Clinical Impression Statement The patient reports a significant improvement in symptoms following dry needling and manual therapy last visit.  She ordered an elbow brace and small weights from [employer] which have not arrived yet.  She has decreased tender points in forearm muscles compared to last visit.  Good pain relief from manual techniques and taping noted following session.     Rehab Potential Good   Clinical Impairments Affecting Rehab Potential bil UE pain   PT Frequency 2x / week   PT Duration 8 weeks   PT  Treatment/Interventions ADLs/Self Care Home Management;Electrical Stimulation;Cryotherapy;Moist Heat;Traction;Therapeutic exercise;Therapeutic activities;Neuromuscular re-education;Patient/family education;Manual techniques;Taping;Dry needling   PT Next Visit Plan Manual techniques and dry  needling right wrist extensors and flexors;  may benefit from DN to left opponens pollicis;  see if patient got elbow brace;  exercise as tolerated;  assess response to taping (she does have her own KT tape at home); periscapular strengthening      Patient will benefit from skilled therapeutic intervention in order to improve the following deficits and impairments:  Decreased activity tolerance, Decreased range of motion, Decreased strength, Postural dysfunction, Improper body mechanics, Pain, Impaired UE functional use, Increased muscle spasms  Visit Diagnosis: Pain in right elbow  Muscle weakness (generalized)     Problem List Patient Active Problem List   Diagnosis Date Noted  . DJD (degenerative joint disease) of knee 03/30/2013  . Unspecified sleep apnea 03/23/2013  . 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 Weakness 11/28/2011  . Hypertension 11/28/2011  . Hypokalemia 11/28/2011  . LUE weakness 11/28/2011  . Hypothyroidism 11/28/2011  . Breast cancer ([place]) 06/19/2011   Ruben Im, PT 01/08/17 5:37 PM Phone: [phone] Fax: [phone]  Alvera Singh 01/08/2017, 5:35 PM  [facility] [address], [place], [place], [address] Phone: [phone]   Fax:  [phone]  Name: [name] MRN: [id number] Date of Birth: 1950-12-01

## 2017-01-13 ENCOUNTER — Ambulatory Visit: Payer: 59 | Admitting: Physical Therapy

## 2017-01-13 DIAGNOSIS — M6281 Muscle weakness (generalized): Secondary | ICD-10-CM

## 2017-01-13 DIAGNOSIS — R293 Abnormal posture: Secondary | ICD-10-CM | POA: Diagnosis not present

## 2017-01-13 DIAGNOSIS — M25521 Pain in right elbow: Secondary | ICD-10-CM

## 2017-01-13 DIAGNOSIS — M79645 Pain in left finger(s): Secondary | ICD-10-CM

## 2017-01-13 NOTE — Therapy (Signed)
[facility] [address], [place], [place], [address] Phone: [phone]   Fax:  [phone]  Physical Therapy Treatment  Patient Details  Name: [name] MRN: [id number] Date of Birth: May 19, 1950 Referring Provider: Dr. Edmonia Lynch  Encounter Date: 01/13/2017      PT End of Session - 01/13/17 0846    Visit Number 8   Number of Visits 10   Date for PT Re-Evaluation 01/20/17   Authorization Type Medicare G codes;  KX at visit 15   PT Start Time 0801   PT Stop Time 0842   PT Time Calculation (min) 41 min   Activity Tolerance Patient tolerated treatment well;No increased pain   Behavior During Therapy WFL for tasks assessed/performed      Past Medical History:  Diagnosis Date  . Arthritis   . Breast cancer ([place])   . Bruises easily   . Cancer [facility]) 2004   rt breast;takes Arimidex daily;thyroid cancer  . History of bronchitis    when she was young  . Hypertension    takes Tribenzor daily  . Hypothyroidism    takes Synthroid daily  . Joint pain   . Joint swelling   . Personal history of chemotherapy 04/2003  . Personal history of radiation therapy 2005  . Pneumonia    hx of when she was young  . Sleep apnea    has cpap but doesn't use;sleep study done 3-35yrs ago  . Urinary frequency     Past Surgical History:  Procedure Laterality Date  . ANTERIOR CERVICAL DECOMP/DISCECTOMY FUSION  2008  . APPENDECTOMY  2007  . BREAST BIOPSY Right 02/28/2003   malignant  . BREAST LUMPECTOMY  2004  . CESAREAN SECTION  24yrs ago  . COLONOSCOPY    . COLONOSCOPY    . KNEE ARTHROSCOPY  2007  . KNEE ARTHROSCOPY  05/02/2011   Procedure: ARTHROSCOPY KNEE;  Surgeon: Johnn Hai;  Location: [place];  Service: Orthopedics;  Laterality: Right;  . MENISCUS DEBRIDEMENT  05/02/2011   Procedure: DEBRIDEMENT OF MENISCUS;  Surgeon: Johnn Hai;  Location: [place];  Service:  Orthopedics;  Laterality: Right;  . porta cath remove  2005  . PORTACATH PLACEMENT  2004  . portacath removal    . THYROID LOBECTOMY Right 09/22/2012   Dr Constance Holster  . THYROIDECTOMY N/A 09/22/2012   Procedure: RIGHT THYROID LOBECTOMY WITH FROZEN SECTION;  Surgeon: Izora Gala, MD;  Location: [place];  Service: ENT;  Laterality: N/A;  . THYROIDECTOMY Left 10/21/2012   Procedure: COMPLETION OF THYROIDECTOMY;  Surgeon: Izora Gala, MD;  Location: [place];  Service: ENT;  Laterality: Left;  . TOTAL KNEE ARTHROPLASTY  04/14/2012   Procedure: TOTAL KNEE ARTHROPLASTY;  Surgeon: Ninetta Lights, MD;  Location: [place];  Service: Orthopedics;  Laterality: Right;  RIGHT ARTHROPLASTY KNEE MEDIAL/LATERAL COMPARTMENTS WITH PATELLA RESURFACING  . TOTAL KNEE ARTHROPLASTY Left 03/30/2013   Procedure: TOTAL KNEE ARTHROPLASTY;  Surgeon: Ninetta Lights, MD;  Location: [place];  Service: Orthopedics;  Laterality: Left;    There were no vitals filed for this visit.      Subjective Assessment - 01/13/17 0857    Subjective Pt reports that things are going well. She has no pain currently. She purchased kinesiotape and is hoping that someone will show her how to tape her own arm.    Currently in Pain? No/denies  [place] Adult PT Treatment/Exercise - 01/13/17 0001      Self-Care   Other Self-Care Comments  educated pt on set up and application of kinesiotape for home use. Pt demonstrated understanding with self application      Exercises   Exercises Other Exercises   Other Exercises  low rows 2x15 reps with green TB, high rows 2x15 reps with green TB; shoulder extension with green TB x20 reps      Wrist Exercises   Other wrist exercises eccentric Rt pronation and supination x10 reps each, yellow TB      Manual Therapy   Manual Therapy Taping   Manual therapy comments "I" strip applied along Rt wrist extensors and flexors from insertion to origin for muscle inhibition, moderate  stretch    Soft tissue mobilization Rt wrist extensors, with intermittent wrist flexor stretch during deep pressure                PT Education - 01/13/17 0845    Education provided Yes   Education Details educated pt on set up and application of kinesiotape; technique with HEP    Person(s) Educated Patient   Methods Explanation;Demonstration;Verbal cues;Handout   Comprehension Verbalized understanding;Returned demonstration          PT Short Term Goals - 01/08/17 1733      PT SHORT TERM GOAL #1   Title The patient will demonstrate a good understanding of modifications for work/home to support posture and correct wrist positioning   Status Achieved     PT SHORT TERM GOAL #2   Title pt will be independent with initial HEP   Status Achieved     PT SHORT TERM GOAL #3   Title The patient will have knowledge and demonstrate compliance with initial HEP for core muscle activation   Status Achieved     PT SHORT TERM GOAL #4   Title Thoracic and lumbar extension improved to 25 degrees needed for standing erect   Status Achieved           PT Long Term Goals - 01/08/17 1733      PT LONG TERM GOAL #1   Title The patient will be independent in safe self progression of HEP needed for further improvements in ROM and strength, function and decreased pain   Time 8   Period Weeks   Status On-going     PT LONG TERM GOAL #2   Title The patient will improve bil UE AROM to painfree    Time 8   Period Weeks   Status On-going     PT LONG TERM GOAL #3   Title the patient will improve bil elbow and wrist strength to 4+/5 or greater   Time 8   Period Weeks   Status On-going     PT LONG TERM GOAL #4   Title Periscapular muscle strength grossly 4+/5 needed for greater ease with lifting   Time 8   Period Weeks   Status On-going     PT LONG TERM GOAL #5   Title FOTO functional outcome improved to 30% indicating improved function with less pain    Time 8   Period Weeks    Status On-going               Plan - 01/13/17 0847    Clinical Impression Statement Pt making gradual improvements in pain, reporting no issues this morning upon arrival. Pt has purchased kinesiotape for home application, so therapist spent a large portion  of today's session educating her on the set up and application for pain relief while at work. Pt was able to demonstrate understanding by the end of today's session. Also introduced periscapular strengthening this session without report of pain/issues following this. Will continue with current POC.   Rehab Potential Good   Clinical Impairments Affecting Rehab Potential bil UE pain   PT Frequency 2x / week   PT Duration 8 weeks   PT Treatment/Interventions ADLs/Self Care Home Management;Electrical Stimulation;Cryotherapy;Moist Heat;Traction;Therapeutic exercise;Therapeutic activities;Neuromuscular re-education;Patient/family education;Manual techniques;Taping;Dry needling   PT Next Visit Plan Manual techniques and dry needling right wrist extensors and flexors;  may benefit from DN to left opponens pollicis;  exercise as tolerated; periscapular strengthening   Consulted and Agree with Plan of Care Patient      Patient will benefit from skilled therapeutic intervention in order to improve the following deficits and impairments:  Decreased activity tolerance, Decreased range of motion, Decreased strength, Postural dysfunction, Improper body mechanics, Pain, Impaired UE functional use, Increased muscle spasms  Visit Diagnosis: Pain in right elbow  Muscle weakness (generalized)  Thumb pain, left  Abnormal posture     Problem List Patient Active Problem List   Diagnosis Date Noted  . DJD (degenerative joint disease) of knee 03/30/2013  . Unspecified sleep apnea 03/23/2013  . 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 Weakness 11/28/2011  . Hypertension 11/28/2011  . Hypokalemia 11/28/2011  . LUE weakness 11/28/2011  . Hypothyroidism  11/28/2011  . Breast cancer ([place]) 06/19/2011    8:58 AM,01/13/17 Elly Modena PT, DPT [facility] [address] [place], [place], [address] Phone: [phone]   Fax:  [phone]  Name: [name] MRN: [id number] Date of Birth: 1950-06-29

## 2017-01-16 ENCOUNTER — Ambulatory Visit: Payer: 59 | Admitting: Physical Therapy

## 2017-01-16 DIAGNOSIS — M6281 Muscle weakness (generalized): Secondary | ICD-10-CM | POA: Diagnosis not present

## 2017-01-16 DIAGNOSIS — M25521 Pain in right elbow: Secondary | ICD-10-CM | POA: Diagnosis not present

## 2017-01-16 DIAGNOSIS — M79645 Pain in left finger(s): Secondary | ICD-10-CM | POA: Diagnosis not present

## 2017-01-16 DIAGNOSIS — R293 Abnormal posture: Secondary | ICD-10-CM | POA: Diagnosis not present

## 2017-01-16 NOTE — Therapy (Signed)
[facility] [address], [place], [place], [address] Phone: [phone]   Fax:  [phone]  Physical Therapy Treatment  Patient Details  Name: [name] MRN: [id number] Date of Birth: 11/14/1950 Referring Provider: Dr. Edmonia Lynch  Encounter Date: 01/16/2017      PT End of Session - 01/16/17 0840    Visit Number 9   Number of Visits 10   Date for PT Re-Evaluation 01/20/17   Authorization Type Medicare G codes;  KX at visit 15   PT Start Time 0800   PT Stop Time 0845   PT Time Calculation (min) 45 min   Activity Tolerance Patient tolerated treatment well      Past Medical History:  Diagnosis Date  . Arthritis   . Breast cancer ([place])   . Bruises easily   . Cancer [facility]) 2004   rt breast;takes Arimidex daily;thyroid cancer  . History of bronchitis    when she was young  . Hypertension    takes Tribenzor daily  . Hypothyroidism    takes Synthroid daily  . Joint pain   . Joint swelling   . Personal history of chemotherapy 04/2003  . Personal history of radiation therapy 2005  . Pneumonia    hx of when she was young  . Sleep apnea    has cpap but doesn't use;sleep study done 3-14yrs ago  . Urinary frequency     Past Surgical History:  Procedure Laterality Date  . ANTERIOR CERVICAL DECOMP/DISCECTOMY FUSION  2008  . APPENDECTOMY  2007  . BREAST BIOPSY Right 02/28/2003   malignant  . BREAST LUMPECTOMY  2004  . CESAREAN SECTION  75yrs ago  . COLONOSCOPY    . COLONOSCOPY    . KNEE ARTHROSCOPY  2007  . KNEE ARTHROSCOPY  05/02/2011   Procedure: ARTHROSCOPY KNEE;  Surgeon: Johnn Hai;  Location: [place];  Service: Orthopedics;  Laterality: Right;  . MENISCUS DEBRIDEMENT  05/02/2011   Procedure: DEBRIDEMENT OF MENISCUS;  Surgeon: Johnn Hai;  Location: [place];  Service: Orthopedics;  Laterality: Right;  . porta cath remove  2005  . PORTACATH  PLACEMENT  2004  . portacath removal    . THYROID LOBECTOMY Right 09/22/2012   Dr Constance Holster  . THYROIDECTOMY N/A 09/22/2012   Procedure: RIGHT THYROID LOBECTOMY WITH FROZEN SECTION;  Surgeon: Izora Gala, MD;  Location: [place];  Service: ENT;  Laterality: N/A;  . THYROIDECTOMY Left 10/21/2012   Procedure: COMPLETION OF THYROIDECTOMY;  Surgeon: Izora Gala, MD;  Location: [place];  Service: ENT;  Laterality: Left;  . TOTAL KNEE ARTHROPLASTY  04/14/2012   Procedure: TOTAL KNEE ARTHROPLASTY;  Surgeon: Ninetta Lights, MD;  Location: [place];  Service: Orthopedics;  Laterality: Right;  RIGHT ARTHROPLASTY KNEE MEDIAL/LATERAL COMPARTMENTS WITH PATELLA RESURFACING  . TOTAL KNEE ARTHROPLASTY Left 03/30/2013   Procedure: TOTAL KNEE ARTHROPLASTY;  Surgeon: Ninetta Lights, MD;  Location: [place];  Service: Orthopedics;  Laterality: Left;    There were no vitals filed for this visit.      Subjective Assessment - 01/16/17 0801    Subjective My left hand and arm have been bothering me and a little on the right medial arm.  I think it's from cleaning out my closet.     Currently in Pain? Yes   Pain Score 8    Pain Location Hand   Pain Orientation Left   Pain Frequency Intermittent   Multiple Pain  Sites Yes   Pain Score 7   Pain Location Elbow   Pain Orientation Right   Pain Frequency Intermittent   Aggravating Factors  cleaning out closet                         [facility] Adult PT Treatment/Exercise - 01/16/17 0001      Self-Care   Other Self-Care Comments  discussion on elbow brace (medial and lateral epicondylagia and how to wear, proper placement     Moist Heat Therapy   Number Minutes Moist Heat 10 Minutes   Moist Heat Location --  right forearm and left hand/arm while discussing self care     Manual Therapy   Soft tissue mobilization right wrist flexors, extensors, pronators, biceps and triceps; left opponens pollicis and wrist flexors/extensors   Myofascial Release Graston  myofascial  G2 sweeping, strumming          Trigger Point Dry Needling - 01/16/17 0838    Muscles Treated Upper Body --  right flexor carpi radialis, pronator;  left opponens pollic                PT Short Term Goals - 01/16/17 1102      PT SHORT TERM GOAL #1   Title The patient will demonstrate a good understanding of modifications for work/home to support posture and correct wrist positioning   Status Achieved     PT SHORT TERM GOAL #2   Title pt will be independent with initial HEP   Status Achieved     PT SHORT TERM GOAL #3   Title The patient will have knowledge and demonstrate compliance with initial HEP for core muscle activation   Status Achieved     PT SHORT TERM GOAL #4   Title Thoracic and lumbar extension improved to 25 degrees needed for standing erect   Status Achieved           PT Long Term Goals - 01/16/17 1103      PT LONG TERM GOAL #1   Title The patient will be independent in safe self progression of HEP needed for further improvements in ROM and strength, function and decreased pain   Time 8   Period Weeks   Status On-going     PT LONG TERM GOAL #2   Title The patient will improve bil UE AROM to painfree    Time 8   Period Weeks   Status On-going     PT LONG TERM GOAL #3   Title the patient will improve bil elbow and wrist strength to 4+/5 or greater   Time 8   Period Weeks   Status On-going     PT LONG TERM GOAL #4   Title Periscapular muscle strength grossly 4+/5 needed for greater ease with lifting   Time 8   Period Weeks   Status On-going     PT LONG TERM GOAL #5   Title FOTO functional outcome improved to 30% indicating improved function with less pain    Time 8   Period Weeks   Status On-going               Plan - 01/16/17 0841    Clinical Impression Statement The patient complains of increased left hand and right elbow pain after cleaning out her closets this week.  She reports she feels better following  dry needling and manual therapy.  She reports she liked the band ex's from last  visit.       Rehab Potential Good   Clinical Impairments Affecting Rehab Potential bil UE pain   PT Frequency 2x / week   PT Duration 8 weeks   PT Next Visit Plan G code Medicare secondary;  ERO  reassess progress toward goals;  Manual techniques and dry needling right wrist extensors and flexors and  left opponens pollicis;  exercise as tolerated; periscapular strengthening; review band ex      Patient will benefit from skilled therapeutic intervention in order to improve the following deficits and impairments:  Decreased activity tolerance, Decreased range of motion, Decreased strength, Postural dysfunction, Improper body mechanics, Pain, Impaired UE functional use, Increased muscle spasms  Visit Diagnosis: Pain in right elbow  Muscle weakness (generalized)  Thumb pain, left     Problem List Patient Active Problem List   Diagnosis Date Noted  . DJD (degenerative joint disease) of knee 03/30/2013  . Unspecified sleep apnea 03/23/2013  . 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 Weakness 11/28/2011  . Hypertension 11/28/2011  . Hypokalemia 11/28/2011  . LUE weakness 11/28/2011  . Hypothyroidism 11/28/2011  . Breast cancer ([place]) 06/19/2011   Ruben Im, PT 01/16/17 11:05 AM Phone: [phone] Fax: [phone]  Alvera Singh 01/16/2017, 11:04 AM  [facility] [address] [place], [place], [address] Phone: [phone]   Fax:  [phone]  Name: [name] MRN: [id number] Date of Birth: Sep 12, 1950

## 2017-01-20 ENCOUNTER — Ambulatory Visit: Payer: 59 | Admitting: Physical Therapy

## 2017-01-20 DIAGNOSIS — M6281 Muscle weakness (generalized): Secondary | ICD-10-CM

## 2017-01-20 DIAGNOSIS — M79645 Pain in left finger(s): Secondary | ICD-10-CM

## 2017-01-20 DIAGNOSIS — R293 Abnormal posture: Secondary | ICD-10-CM | POA: Diagnosis not present

## 2017-01-20 DIAGNOSIS — M25521 Pain in right elbow: Secondary | ICD-10-CM

## 2017-01-20 NOTE — Therapy (Signed)
[facility] [address] [place], [place], [address] Phone: [phone]   Fax:  [phone]  Physical Therapy Treatment/Recertification  Patient Details  Name: [name] MRN: [id number] Date of Birth: 21-Dec-1950 Referring Provider: Dr. Edmonia Lynch  Encounter Date: 01/20/2017      PT End of Session - 01/20/17 1107    Visit Number 10   Number of Visits 20   Date for PT Re-Evaluation 03/03/17   Authorization Type Medicare G codes;  KX at visit 15   PT Start Time 0805   PT Stop Time 0847   PT Time Calculation (min) 42 min   Activity Tolerance Patient tolerated treatment well      Past Medical History:  Diagnosis Date  . Arthritis   . Breast cancer ([place])   . Bruises easily   . Cancer [facility]) 2004   rt breast;takes Arimidex daily;thyroid cancer  . History of bronchitis    when she was young  . Hypertension    takes Tribenzor daily  . Hypothyroidism    takes Synthroid daily  . Joint pain   . Joint swelling   . Personal history of chemotherapy 04/2003  . Personal history of radiation therapy 2005  . Pneumonia    hx of when she was young  . Sleep apnea    has cpap but doesn't use;sleep study done 3-38yrs ago  . Urinary frequency     Past Surgical History:  Procedure Laterality Date  . ANTERIOR CERVICAL DECOMP/DISCECTOMY FUSION  2008  . APPENDECTOMY  2007  . BREAST BIOPSY Right 02/28/2003   malignant  . BREAST LUMPECTOMY  2004  . CESAREAN SECTION  67yrs ago  . COLONOSCOPY    . COLONOSCOPY    . KNEE ARTHROSCOPY  2007  . KNEE ARTHROSCOPY  05/02/2011   Procedure: ARTHROSCOPY KNEE;  Surgeon: Johnn Hai;  Location: [place];  Service: Orthopedics;  Laterality: Right;  . MENISCUS DEBRIDEMENT  05/02/2011   Procedure: DEBRIDEMENT OF MENISCUS;  Surgeon: Johnn Hai;  Location: [place];  Service: Orthopedics;  Laterality: Right;  . porta cath remove  2005  .  PORTACATH PLACEMENT  2004  . portacath removal    . THYROID LOBECTOMY Right 09/22/2012   Dr Constance Holster  . THYROIDECTOMY N/A 09/22/2012   Procedure: RIGHT THYROID LOBECTOMY WITH FROZEN SECTION;  Surgeon: Izora Gala, MD;  Location: [place];  Service: ENT;  Laterality: N/A;  . THYROIDECTOMY Left 10/21/2012   Procedure: COMPLETION OF THYROIDECTOMY;  Surgeon: Izora Gala, MD;  Location: [place];  Service: ENT;  Laterality: Left;  . TOTAL KNEE ARTHROPLASTY  04/14/2012   Procedure: TOTAL KNEE ARTHROPLASTY;  Surgeon: Ninetta Lights, MD;  Location: [place];  Service: Orthopedics;  Laterality: Right;  RIGHT ARTHROPLASTY KNEE MEDIAL/LATERAL COMPARTMENTS WITH PATELLA RESURFACING  . TOTAL KNEE ARTHROPLASTY Left 03/30/2013   Procedure: TOTAL KNEE ARTHROPLASTY;  Surgeon: Ninetta Lights, MD;  Location: [place];  Service: Orthopedics;  Laterality: Left;    There were no vitals filed for this visit.      Subjective Assessment - 01/20/17 0804    Subjective Had a good time on her trip to [facility].  I was careful not to aggravate my elbow and hand.   Medial elbow doesn't hurt but with lateral elbow pain with rotating arm.  My left thumb DN was sore but it really helped.    Overall 60% better regarding elbow pain.  Currently in Pain? Yes   Pain Score 7    Pain Location Elbow   Pain Orientation Right   Pain Score 7   Pain Location Hand   Pain Orientation Left   Pain Type Chronic pain            [facility] PT Assessment - 01/20/17 0001      Observation/Other Assessments   Focus on Therapeutic Outcomes (FOTO)  43% limitation     Strength   Right Elbow Flexion 4/5   Right Elbow Extension 4/5   Left Elbow Flexion 4/5   Left Elbow Extension 4/5   Right Forearm Pronation 4/5  pain   Right Forearm Supination 4/5   Left Forearm Pronation 4/5   Left Forearm Supination 4/5   Right Wrist Flexion 4/5  pain   Right Wrist Extension 4/5   Left Wrist Flexion 4/5   Left Wrist Extension 4/5   Right Hand Grip (lbs)  35   Left Hand Grip (lbs) 20                     [facility] Adult PT Treatment/Exercise - 01/20/17 0001      Exercises   Other Exercises  low rows 2x15 reps with green TB, high rows 2x15 reps with green TB; shoulder extension with green TB x20 reps      Wrist Exercises   Other wrist exercises eccentric Rt and left pronation and supination x10 reps each, yellow TB    Other wrist exercises eccentric wrist flexion/extension 10x right/left     Manual Therapy   Soft tissue mobilization right wrist flexors, extensors, pronators, biceps and triceps   Myofascial Release Graston myofascial  G2 sweeping, strumming                  PT Short Term Goals - 01/20/17 1118      PT SHORT TERM GOAL #1   Title The patient will demonstrate a good understanding of modifications for work/home to support posture and correct wrist positioning   Status Achieved     PT SHORT TERM GOAL #2   Title pt will be independent with initial HEP   Status Achieved     PT SHORT TERM GOAL #3   Title The patient will have knowledge and demonstrate compliance with initial HEP for core muscle activation   Status Achieved           PT Long Term Goals - 01/20/17 2505      PT LONG TERM GOAL #1   Title The patient will be independent in safe self progression of HEP needed for further improvements in ROM and strength, function and decreased pain   Time 8   Period Weeks   Status On-going     PT LONG TERM GOAL #2   Title The patient will improve bil UE AROM to painfree    Time 8   Period Weeks   Status On-going     PT LONG TERM GOAL #3   Title the patient will improve bil elbow and wrist strength to 4+/5 or greater   Time 8   Period Weeks   Status On-going     PT LONG TERM GOAL #4   Title Periscapular muscle strength grossly 4+/5 needed for greater ease with lifting   Time 8   Period Weeks   Status On-going     PT LONG TERM GOAL #5   Title FOTO functional outcome improved to 30%  indicating improved function  with less pain    Time 8   Period Weeks   Status On-going               Plan - 2017-01-31 1112    Clinical Impression Statement The patient reports she is 60% better overall with bilateral UE pain and function.  She continues to have pain easily exacerbated with activity.  Improving with strength but still with shoulder, elbow and hand weakness.  She would benefit from continued treatment for further improvements in function without pain and strength.     Rehab Potential Good   Clinical Impairments Affecting Rehab Potential bil UE pain   PT Frequency 2x / week   PT Duration 8 weeks   PT Treatment/Interventions ADLs/Self Care Home Management;Electrical Stimulation;Cryotherapy;Moist Heat;Traction;Therapeutic exercise;Therapeutic activities;Neuromuscular re-education;Patient/family education;Manual techniques;Taping;Dry needling   PT Next Visit Plan  Manual techniques and dry needling right wrist extensors and flexors and  left opponens pollicis;  exercise as tolerated; periscapular strengthening; review band ex      Patient will benefit from skilled therapeutic intervention in order to improve the following deficits and impairments:  Decreased activity tolerance, Decreased range of motion, Decreased strength, Postural dysfunction, Improper body mechanics, Pain, Impaired UE functional use, Increased muscle spasms  Visit Diagnosis: Pain in right elbow  Muscle weakness (generalized)  Thumb pain, left       G-Codes - 01-31-2017 0834    Functional Assessment Tool Used (Outpatient Only) FOTO   Functional Limitation Mobility: Walking and moving around   Mobility: Walking and Moving Around Current Status [phone]) At least 40 percent but less than 60 percent impaired, limited or restricted   Mobility: Walking and Moving Around Goal Status [phone]) At least 20 percent but less than 40 percent impaired, limited or restricted      Problem List Patient Active  Problem List   Diagnosis Date Noted  . DJD (degenerative joint disease) of knee 03/30/2013  . Unspecified sleep apnea 03/23/2013  . 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 Weakness 11/28/2011  . Hypertension 11/28/2011  . Hypokalemia 11/28/2011  . LUE weakness 11/28/2011  . Hypothyroidism 11/28/2011  . Breast cancer ([place]) 06/19/2011   Ruben Im, PT 01/31/17 5:28 PM Phone: [phone] Fax: [phone]  Alvera Singh 31-Jan-2017, 11:19 AM  [facility] [address] [place], [place], [address] Phone: [phone]   Fax:  [phone]  Name: JERNI SELMER MRN: [id number] Date of Birth: 02-24-1951

## 2017-01-22 ENCOUNTER — Ambulatory Visit: Payer: 59 | Admitting: Physical Therapy

## 2017-01-22 DIAGNOSIS — R293 Abnormal posture: Secondary | ICD-10-CM | POA: Diagnosis not present

## 2017-01-22 DIAGNOSIS — M25521 Pain in right elbow: Secondary | ICD-10-CM | POA: Diagnosis not present

## 2017-01-22 DIAGNOSIS — M79645 Pain in left finger(s): Secondary | ICD-10-CM | POA: Diagnosis not present

## 2017-01-22 DIAGNOSIS — M6281 Muscle weakness (generalized): Secondary | ICD-10-CM | POA: Diagnosis not present

## 2017-01-22 NOTE — Therapy (Signed)
[facility] [address], [place], [place], [address] Phone: [phone]   Fax:  [phone]  Physical Therapy Treatment  Patient Details  Name: [name] MRN: [id number] Date of Birth: 1950-12-21 Referring Provider: Dr. Edmonia Lynch  Encounter Date: 01/22/2017      PT End of Session - 01/22/17 0835    Visit Number 11   Number of Visits 20   Date for PT Re-Evaluation 03/03/17   Authorization Type Medicare G codes;  KX at visit 15   PT Start Time 0803   PT Stop Time 0830  pt needs to leave early   PT Time Calculation (min) 27 min   Activity Tolerance Patient tolerated treatment well      Past Medical History:  Diagnosis Date  . Arthritis   . Breast cancer ([place])   . Bruises easily   . Cancer [facility]) 2004   rt breast;takes Arimidex daily;thyroid cancer  . History of bronchitis    when she was young  . Hypertension    takes Tribenzor daily  . Hypothyroidism    takes Synthroid daily  . Joint pain   . Joint swelling   . Personal history of chemotherapy 04/2003  . Personal history of radiation therapy 2005  . Pneumonia    hx of when she was young  . Sleep apnea    has cpap but doesn't use;sleep study done 3-18yrs ago  . Urinary frequency     Past Surgical History:  Procedure Laterality Date  . ANTERIOR CERVICAL DECOMP/DISCECTOMY FUSION  2008  . APPENDECTOMY  2007  . BREAST BIOPSY Right 02/28/2003   malignant  . BREAST LUMPECTOMY  2004  . CESAREAN SECTION  57yrs ago  . COLONOSCOPY    . COLONOSCOPY    . KNEE ARTHROSCOPY  2007  . KNEE ARTHROSCOPY  05/02/2011   Procedure: ARTHROSCOPY KNEE;  Surgeon: Johnn Hai;  Location: [place];  Service: Orthopedics;  Laterality: Right;  . MENISCUS DEBRIDEMENT  05/02/2011   Procedure: DEBRIDEMENT OF MENISCUS;  Surgeon: Johnn Hai;  Location: [place];  Service: Orthopedics;  Laterality: Right;  . porta cath remove   2005  . PORTACATH PLACEMENT  2004  . portacath removal    . THYROID LOBECTOMY Right 09/22/2012   Dr Constance Holster  . THYROIDECTOMY N/A 09/22/2012   Procedure: RIGHT THYROID LOBECTOMY WITH FROZEN SECTION;  Surgeon: Izora Gala, MD;  Location: [place];  Service: ENT;  Laterality: N/A;  . THYROIDECTOMY Left 10/21/2012   Procedure: COMPLETION OF THYROIDECTOMY;  Surgeon: Izora Gala, MD;  Location: [place];  Service: ENT;  Laterality: Left;  . TOTAL KNEE ARTHROPLASTY  04/14/2012   Procedure: TOTAL KNEE ARTHROPLASTY;  Surgeon: Ninetta Lights, MD;  Location: [place];  Service: Orthopedics;  Laterality: Right;  RIGHT ARTHROPLASTY KNEE MEDIAL/LATERAL COMPARTMENTS WITH PATELLA RESURFACING  . TOTAL KNEE ARTHROPLASTY Left 03/30/2013   Procedure: TOTAL KNEE ARTHROPLASTY;  Surgeon: Ninetta Lights, MD;  Location: [place];  Service: Orthopedics;  Laterality: Left;    There were no vitals filed for this visit.      Subjective Assessment - 01/22/17 0805    Subjective I need to be out a little early today.  I would like to do the dry needling today.  Right medial > lateral elbow pain.     Currently in Pain? Yes   Pain Score 6    Pain Location Elbow   Pain Orientation Right   Pain  Type Chronic pain   Multiple Pain Sites Yes   Pain Score 6   Pain Location Hand   Pain Orientation Left                         [facility] Adult PT Treatment/Exercise - 01/22/17 0001      Self-Care   Other Self-Care Comments  self care related to after care with dry needling, taping and ADLS     Manual Therapy   Soft tissue mobilization right wrist flexors, extensors, pronators, biceps and triceps   Kinesiotex Inhibit Muscle     Kinesiotix   Inhibit Muscle  3 I strips: wrist flexors, extensors and across extensor wad          Trigger Point Dry Needling - 01/22/17 0834    Consent Given? Yes   Muscles Treated Upper Body --  right wrist extensors and flexors                PT Short Term Goals - 01/22/17  1108      PT SHORT TERM GOAL #1   Title The patient will demonstrate a good understanding of modifications for work/home to support posture and correct wrist positioning   Status Achieved     PT SHORT TERM GOAL #2   Title pt will be independent with initial HEP   Status Achieved     PT SHORT TERM GOAL #3   Title The patient will have knowledge and demonstrate compliance with initial HEP for core muscle activation   Status Achieved     PT SHORT TERM GOAL #4   Title Thoracic and lumbar extension improved to 25 degrees needed for standing erect   Status Achieved           PT Long Term Goals - 01/22/17 1109      PT LONG TERM GOAL #1   Title The patient will be independent in safe self progression of HEP needed for further improvements in ROM and strength, function and decreased pain   Time 8   Period Weeks   Status On-going     PT LONG TERM GOAL #2   Title The patient will improve bil UE AROM to painfree    Time 8   Period Weeks   Status On-going     PT LONG TERM GOAL #3   Title the patient will improve bil elbow and wrist strength to 4+/5 or greater   Time 8   Period Weeks   Status On-going     PT LONG TERM GOAL #4   Title Periscapular muscle strength grossly 4+/5 needed for greater ease with lifting   Time 8   Period Weeks   Status On-going     PT LONG TERM GOAL #5   Title FOTO functional outcome improved to 30% indicating improved function with less pain    Time 8   Period Weeks   Status On-going               Plan - 01/22/17 0836    Clinical Impression Statement The patient needs to leave early today secondary to work.  She has tender points in right wrist flexors, extensors, supinator and pronator.  Improved soft tissue length noted following manual therapy and dry needling.  Tennis elbow brace placed for patient along with kinesiotape for muscular support.     Rehab Potential Good   Clinical Impairments Affecting Rehab Potential bil UE pain   PT  Frequency  2x / week   PT Duration 8 weeks   PT Treatment/Interventions ADLs/Self Care Home Management;Electrical Stimulation;Cryotherapy;Moist Heat;Traction;Therapeutic exercise;Therapeutic activities;Neuromuscular re-education;Patient/family education;Manual techniques;Taping;Dry needling   PT Next Visit Plan  Manual techniques and dry needling right wrist extensors and flexors and  left opponens pollicis;  exercise as tolerated; periscapular strengthening; kinesiotaping;  Graston      Patient will benefit from skilled therapeutic intervention in order to improve the following deficits and impairments:  Decreased activity tolerance, Decreased range of motion, Decreased strength, Postural dysfunction, Improper body mechanics, Pain, Impaired UE functional use, Increased muscle spasms  Visit Diagnosis: Pain in right elbow  Muscle weakness (generalized)     Problem List Patient Active Problem List   Diagnosis Date Noted  . DJD (degenerative joint disease) of knee 03/30/2013  . Unspecified sleep apnea 03/23/2013  . 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 Weakness 11/28/2011  . Hypertension 11/28/2011  . Hypokalemia 11/28/2011  . LUE weakness 11/28/2011  . Hypothyroidism 11/28/2011  . Breast cancer ([place]) 06/19/2011   Ruben Im, PT 01/22/17 11:10 AM Phone: [phone] Fax: [phone]  Alvera Singh 01/22/2017, 11:10 AM  [facility] [address] [place], [place], [address] Phone: [phone]   Fax:  [phone]  Name: [name] MRN: [id number] Date of Birth: 1950-11-22

## 2017-01-27 ENCOUNTER — Encounter: Payer: Self-pay | Admitting: Physical Therapy

## 2017-01-27 ENCOUNTER — Ambulatory Visit: Payer: 59 | Attending: Orthopedic Surgery | Admitting: Physical Therapy

## 2017-01-27 DIAGNOSIS — M25521 Pain in right elbow: Secondary | ICD-10-CM | POA: Diagnosis not present

## 2017-01-27 DIAGNOSIS — M79645 Pain in left finger(s): Secondary | ICD-10-CM | POA: Insufficient documentation

## 2017-01-27 DIAGNOSIS — R293 Abnormal posture: Secondary | ICD-10-CM | POA: Insufficient documentation

## 2017-01-27 DIAGNOSIS — M6281 Muscle weakness (generalized): Secondary | ICD-10-CM | POA: Insufficient documentation

## 2017-01-27 NOTE — Therapy (Signed)
[facility] [address], [place], [place], [address] Phone: [phone]   Fax:  [phone]  Physical Therapy Treatment  Patient Details  Name: [name] MRN: [id number] Date of Birth: Jan 27, 1951 Referring Provider: Dr. Edmonia Lynch  Encounter Date: 01/27/2017      PT End of Session - 01/27/17 0841    Visit Number 12   Number of Visits 20   Date for PT Re-Evaluation 03/03/17   Authorization Type Medicare G codes;  KX at visit 15   PT Start Time 0800   PT Stop Time 0841   PT Time Calculation (min) 41 min   Activity Tolerance Patient tolerated treatment well   Behavior During Therapy [facility] for tasks assessed/performed      Past Medical History:  Diagnosis Date  . Arthritis   . Breast cancer ([place])   . Bruises easily   . Cancer [facility]) 2004   rt breast;takes Arimidex daily;thyroid cancer  . History of bronchitis    when she was young  . Hypertension    takes Tribenzor daily  . Hypothyroidism    takes Synthroid daily  . Joint pain   . Joint swelling   . Personal history of chemotherapy 04/2003  . Personal history of radiation therapy 2005  . Pneumonia    hx of when she was young  . Sleep apnea    has cpap but doesn't use;sleep study done 3-46yrs ago  . Urinary frequency     Past Surgical History:  Procedure Laterality Date  . ANTERIOR CERVICAL DECOMP/DISCECTOMY FUSION  2008  . APPENDECTOMY  2007  . BREAST BIOPSY Right 02/28/2003   malignant  . BREAST LUMPECTOMY  2004  . CESAREAN SECTION  50yrs ago  . COLONOSCOPY    . COLONOSCOPY    . KNEE ARTHROSCOPY  2007  . KNEE ARTHROSCOPY  05/02/2011   Procedure: ARTHROSCOPY KNEE;  Surgeon: Johnn Hai;  Location: [place];  Service: Orthopedics;  Laterality: Right;  . MENISCUS DEBRIDEMENT  05/02/2011   Procedure: DEBRIDEMENT OF MENISCUS;  Surgeon: Johnn Hai;  Location: [place];  Service: Orthopedics;   Laterality: Right;  . porta cath remove  2005  . PORTACATH PLACEMENT  2004  . portacath removal    . THYROID LOBECTOMY Right 09/22/2012   Dr Constance Holster  . THYROIDECTOMY N/A 09/22/2012   Procedure: RIGHT THYROID LOBECTOMY WITH FROZEN SECTION;  Surgeon: Izora Gala, MD;  Location: [place];  Service: ENT;  Laterality: N/A;  . THYROIDECTOMY Left 10/21/2012   Procedure: COMPLETION OF THYROIDECTOMY;  Surgeon: Izora Gala, MD;  Location: [place];  Service: ENT;  Laterality: Left;  . TOTAL KNEE ARTHROPLASTY  04/14/2012   Procedure: TOTAL KNEE ARTHROPLASTY;  Surgeon: Ninetta Lights, MD;  Location: [place];  Service: Orthopedics;  Laterality: Right;  RIGHT ARTHROPLASTY KNEE MEDIAL/LATERAL COMPARTMENTS WITH PATELLA RESURFACING  . TOTAL KNEE ARTHROPLASTY Left 03/30/2013   Procedure: TOTAL KNEE ARTHROPLASTY;  Surgeon: Ninetta Lights, MD;  Location: [place];  Service: Orthopedics;  Laterality: Left;    There were no vitals filed for this visit.      Subjective Assessment - 01/27/17 0807    Subjective the dry needling is helping. Helps with the elbow pain.    Pertinent History hx of breast CA;  2 TKRs; HTN;  neck surgery 10 years ago with arm pain.  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     Limitations Lifting  Diagnostic tests MRI;  Xray;  will do another MRI of another part of back   Patient Stated Goals Decrease the pain in both UEs   Currently in Pain? Yes   Pain Score 7    Pain Location Elbow   Pain Orientation Right   Pain Descriptors / Indicators Aching   Pain Type Chronic pain   Pain Onset More than a month ago   Pain Frequency Intermittent   Aggravating Factors  typing,    Pain Relieving Factors rest   Multiple Pain Sites Yes   Pain Score 7   Pain Location Hand   Pain Orientation Left   Pain Descriptors / Indicators Aching   Pain Type Chronic pain   Pain Onset More than a month ago   Pain Frequency Intermittent   Aggravating Factors  cleaning out the closet   Pain Relieving  Factors rest, heat                         [facility] Adult PT Treatment/Exercise - 01/27/17 0001      Wrist Exercises   Other wrist exercises eccentric Rt and left pronation and supination x10 reps each, yellow TB    Other wrist exercises eccentric wrist flexion/extension 10x right/left     Manual Therapy   Manual Therapy Soft tissue mobilization   Soft tissue mobilization right wrist flexors, extensors, pronators, biceps and triceps          Trigger Point Dry Needling - 01/27/17 0820    Consent Given? Yes   Education Handout Provided Yes   Muscles Treated Upper Body --  pronator; flexor digitorum superficialis; flexor ulnaris                PT Short Term Goals - 01/22/17 1108      PT SHORT TERM GOAL #1   Title The patient will demonstrate a good understanding of modifications for work/home to support posture and correct wrist positioning   Status Achieved     PT SHORT TERM GOAL #2   Title pt will be independent with initial HEP   Status Achieved     PT SHORT TERM GOAL #3   Title The patient will have knowledge and demonstrate compliance with initial HEP for core muscle activation   Status Achieved     PT SHORT TERM GOAL #4   Title Thoracic and lumbar extension improved to 25 degrees needed for standing erect   Status Achieved           PT Long Term Goals - 01/27/17 0844      PT LONG TERM GOAL #1   Title The patient will be independent in safe self progression of HEP needed for further improvements in ROM and strength, function and decreased pain   Baseline still learning   Time 8   Period Weeks   Status On-going     PT LONG TERM GOAL #2   Title The patient will improve bil UE AROM to painfree    Baseline pain level 7/10   Time 8   Status On-going     PT LONG TERM GOAL #3   Title the patient will improve bil elbow and wrist strength to 4+/5 or greater   Baseline 80% overall improvement   Time 8   Period Weeks   Status On-going      PT LONG TERM GOAL #4   Title Periscapular muscle strength grossly 4+/5 needed for greater ease with lifting  Period Weeks   Status On-going               Plan - 01/27/17 0841    Clinical Impression Statement Patient pain level in right foream decreased to 5/10 after 7/10.  Patient is using the tennis elbow brace correctly. Patient had increased tissue mobility after therapy.  Patient will benefit from skilled therapy to reduce pain and improve function.    Clinical Impairments Affecting Rehab Potential bil UE pain; no ultrasound due to breast cancer   PT Frequency 2x / week   PT Duration 8 weeks   PT Treatment/Interventions ADLs/Self Care Home Management;Electrical Stimulation;Cryotherapy;Moist Heat;Traction;Therapeutic exercise;Therapeutic activities;Neuromuscular re-education;Patient/family education;Manual techniques;Taping;Dry needling   PT Next Visit Plan  Manual techniques and dry needling right wrist extensors and flexors and  left opponens pollicis;  exercise as tolerated; periscapular strengthening; kinesiotaping;  [place]   PT Home Exercise Plan progress as needed   Recommended Other Services MD signed note   Consulted and Agree with Plan of Care Patient      Patient will benefit from skilled therapeutic intervention in order to improve the following deficits and impairments:  Decreased activity tolerance, Decreased range of motion, Decreased strength, Postural dysfunction, Improper body mechanics, Pain, Impaired UE functional use, Increased muscle spasms  Visit Diagnosis: Pain in right elbow  Muscle weakness (generalized)     Problem List Patient Active Problem List   Diagnosis Date Noted  . DJD (degenerative joint disease) of knee 03/30/2013  . Unspecified sleep apnea 03/23/2013  . 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 Weakness 11/28/2011  . Hypertension 11/28/2011  . Hypokalemia 11/28/2011  . LUE weakness 11/28/2011  . Hypothyroidism 11/28/2011  . Breast  cancer ([place]) 06/19/2011    Earlie Counts, PT 01/27/17 8:45 AM   [facility] [address], [place], [place], [address] Phone: [phone]   Fax:  [phone]  Name: [name] MRN: [id number] Date of Birth: [birth year]

## 2017-01-27 NOTE — Patient Instructions (Signed)

## 2017-01-29 ENCOUNTER — Ambulatory Visit: Payer: 59 | Admitting: Physical Therapy

## 2017-02-03 ENCOUNTER — Ambulatory Visit: Payer: 59 | Admitting: Physical Therapy

## 2017-02-03 DIAGNOSIS — M79645 Pain in left finger(s): Secondary | ICD-10-CM | POA: Diagnosis not present

## 2017-02-03 DIAGNOSIS — M6281 Muscle weakness (generalized): Secondary | ICD-10-CM

## 2017-02-03 DIAGNOSIS — M25521 Pain in right elbow: Secondary | ICD-10-CM

## 2017-02-03 DIAGNOSIS — R293 Abnormal posture: Secondary | ICD-10-CM | POA: Diagnosis not present

## 2017-02-03 NOTE — Therapy (Signed)
[facility] [address], [place], [place], [address] Phone: [phone]   Fax:  [phone]  Physical Therapy Treatment  Patient Details  Name: [name] MRN: [id number] Date of Birth: [birth year] Referring Provider: Dr. Edmonia Lynch  Encounter Date: 02/03/2017      PT End of Session - 02/03/17 0739    Visit Number 13   Number of Visits 20   Date for PT Re-Evaluation 03/03/17   Authorization Type Medicare G codes;  KX at visit 15   PT Start Time 0726   PT Stop Time 0800  dry needling/moist heat   PT Time Calculation (min) 34 min   Activity Tolerance Patient tolerated treatment well      Past Medical History:  Diagnosis Date  . Arthritis   . Breast cancer ([place])   . Bruises easily   . Cancer [facility]) 2004   rt breast;takes Arimidex daily;thyroid cancer  . History of bronchitis    when she was young  . Hypertension    takes Tribenzor daily  . Hypothyroidism    takes Synthroid daily  . Joint pain   . Joint swelling   . Personal history of chemotherapy 04/2003  . Personal history of radiation therapy 2005  . Pneumonia    hx of when she was young  . Sleep apnea    has cpap but doesn't use;sleep study done 3-71yrs ago  . Urinary frequency     Past Surgical History:  Procedure Laterality Date  . ANTERIOR CERVICAL DECOMP/DISCECTOMY FUSION  2008  . APPENDECTOMY  2007  . BREAST BIOPSY Right 02/28/2003   malignant  . BREAST LUMPECTOMY  2004  . CESAREAN SECTION  76yrs ago  . COLONOSCOPY    . COLONOSCOPY    . KNEE ARTHROSCOPY  2007  . KNEE ARTHROSCOPY  05/02/2011   Procedure: ARTHROSCOPY KNEE;  Surgeon: Johnn Hai;  Location: [place];  Service: Orthopedics;  Laterality: Right;  . MENISCUS DEBRIDEMENT  05/02/2011   Procedure: DEBRIDEMENT OF MENISCUS;  Surgeon: Johnn Hai;  Location: [place];  Service: Orthopedics;  Laterality: Right;  . porta cath remove   2005  . PORTACATH PLACEMENT  2004  . portacath removal    . THYROID LOBECTOMY Right 09/22/2012   Dr Constance Holster  . THYROIDECTOMY N/A 09/22/2012   Procedure: RIGHT THYROID LOBECTOMY WITH FROZEN SECTION;  Surgeon: Izora Gala, MD;  Location: [place];  Service: ENT;  Laterality: N/A;  . THYROIDECTOMY Left 10/21/2012   Procedure: COMPLETION OF THYROIDECTOMY;  Surgeon: Izora Gala, MD;  Location: [place];  Service: ENT;  Laterality: Left;  . TOTAL KNEE ARTHROPLASTY  04/14/2012   Procedure: TOTAL KNEE ARTHROPLASTY;  Surgeon: Ninetta Lights, MD;  Location: [place];  Service: Orthopedics;  Laterality: Right;  RIGHT ARTHROPLASTY KNEE MEDIAL/LATERAL COMPARTMENTS WITH PATELLA RESURFACING  . TOTAL KNEE ARTHROPLASTY Left 03/30/2013   Procedure: TOTAL KNEE ARTHROPLASTY;  Surgeon: Ninetta Lights, MD;  Location: [place];  Service: Orthopedics;  Laterality: Left;    There were no vitals filed for this visit.      Subjective Assessment - 02/03/17 0725    Subjective Today is not bad.  The right arm has been nice to me but my left hand bothers me.     Pertinent History hx of breast CA;  2 TKRs; HTN;  neck surgery 10 years ago with arm pain.  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  Currently in Pain? Yes   Pain Score 7    Pain Location Hand   Pain Type Chronic pain   Aggravating Factors  no specific task   Pain Relieving Factors dry needling   Pain Score 5   Pain Location Elbow   Pain Orientation Right   Pain Type Chronic pain            [facility] PT Assessment - 02/03/17 0001      Strength   Right Elbow Flexion 4+/5   Right Elbow Extension 4+/5   Right Forearm Pronation 4/5   Right Forearm Supination 4/5   Left Forearm Pronation 4/5   Left Forearm Supination 4/5   Right Wrist Flexion 4/5   Right Wrist Extension 4/5                     [facility] Adult PT Treatment/Exercise - 02/03/17 0001      Wrist Exercises   Other wrist exercises eccentric 1# wrist extensors,  supinators/pronators 10x each   Other wrist exercises yellow band biceps, triceps and shoulder extension 10x each     Moist Heat Therapy   Number Minutes Moist Heat 5 Minutes   Moist Heat Location --  left hand     Manual Therapy   Soft tissue mobilization left opponens pollicis          Trigger Point Dry Needling - 02/03/17 0739    Consent Given? Yes   Muscles Treated Upper Body --  left opponens pollicis                PT Short Term Goals - 02/03/17 0755      PT SHORT TERM GOAL #1   Title The patient will demonstrate a good understanding of modifications for work/home to support posture and correct wrist positioning   Status Achieved     PT SHORT TERM GOAL #2   Title pt will be independent with initial HEP   Status Achieved           PT Long Term Goals - 02/03/17 0755      PT LONG TERM GOAL #1   Title The patient will be independent in safe self progression of HEP needed for further improvements in ROM and strength, function and decreased pain   Time 8   Period Weeks   Status On-going     PT LONG TERM GOAL #2   Title The patient will improve bil UE AROM to painfree    Time 8   Period Weeks   Status On-going     PT LONG TERM GOAL #3   Title the patient will improve bil elbow and wrist strength to 4+/5 or greater   Time 8   Period Weeks   Status On-going     PT LONG TERM GOAL #4   Title Periscapular muscle strength grossly 4+/5 needed for greater ease with lifting   Time 8   Period Weeks   Status On-going     PT LONG TERM GOAL #5   Title FOTO functional outcome improved to 30% indicating improved function with less pain    Time 8   Period Weeks   Status On-going               Plan - 02/03/17 0740    Clinical Impression Statement Patient's primary complaint is left thumb region pain.  She reports good relief with dry needling and manual therapy.  Able to perform right UE eccentrics without exacerbation  of pain.     Rehab Potential  Good   Clinical Impairments Affecting Rehab Potential bil UE pain; no ultrasound due to breast cancer   PT Frequency 2x / week   PT Duration 8 weeks   PT Next Visit Plan  UE Ranger for overhead work on right;  Manual techniques and dry needling right wrist extensors and flexors and  left opponens pollicis;  exercise as tolerated; periscapular strengthening; kinesiotaping;  [place]      Patient will benefit from skilled therapeutic intervention in order to improve the following deficits and impairments:  Decreased activity tolerance, Decreased range of motion, Decreased strength, Postural dysfunction, Improper body mechanics, Pain, Impaired UE functional use, Increased muscle spasms  Visit Diagnosis: Pain in right elbow  Muscle weakness (generalized)  Thumb pain, left     Problem List Patient Active Problem List   Diagnosis Date Noted  . DJD (degenerative joint disease) of knee 03/30/2013  . Unspecified sleep apnea 03/23/2013  . 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 Weakness 11/28/2011  . Hypertension 11/28/2011  . Hypokalemia 11/28/2011  . LUE weakness 11/28/2011  . Hypothyroidism 11/28/2011  . Breast cancer ([place]) 06/19/2011   Ruben Im, PT 02/03/17 7:58 AM Phone: [phone] Fax: [phone]  Alvera Singh 02/03/2017, 7:58 AM  [facility] [address], [place], [place], [address] Phone: [phone]   Fax:  [phone]  Name: [name] MRN: [id number] Date of Birth: 1951/04/17

## 2017-02-05 ENCOUNTER — Ambulatory Visit: Payer: 59 | Admitting: Physical Therapy

## 2017-02-05 DIAGNOSIS — M6281 Muscle weakness (generalized): Secondary | ICD-10-CM

## 2017-02-05 DIAGNOSIS — M25521 Pain in right elbow: Secondary | ICD-10-CM

## 2017-02-05 DIAGNOSIS — M79645 Pain in left finger(s): Secondary | ICD-10-CM | POA: Diagnosis not present

## 2017-02-05 DIAGNOSIS — R293 Abnormal posture: Secondary | ICD-10-CM | POA: Diagnosis not present

## 2017-02-05 NOTE — Therapy (Signed)
[facility] [address], [place], [place], [address] Phone: [phone]   Fax:  [phone]  Physical Therapy Treatment  Patient Details  Name: [name] MRN: [id number] Date of Birth: [birth year] Referring Provider: Dr. Edmonia Lynch  Encounter Date: 02/05/2017      PT End of Session - 02/05/17 0824    Visit Number 14   Number of Visits 20   Date for PT Re-Evaluation 03/03/17   Authorization Type Medicare G codes;  KX at visit 15   PT Start Time 0800   PT Stop Time 0840   PT Time Calculation (min) 40 min   Activity Tolerance Patient tolerated treatment well      Past Medical History:  Diagnosis Date  . Arthritis   . Breast cancer ([place])   . Bruises easily   . Cancer [facility]) 2004   rt breast;takes Arimidex daily;thyroid cancer  . History of bronchitis    when she was young  . Hypertension    takes Tribenzor daily  . Hypothyroidism    takes Synthroid daily  . Joint pain   . Joint swelling   . Personal history of chemotherapy 04/2003  . Personal history of radiation therapy 2005  . Pneumonia    hx of when she was young  . Sleep apnea    has cpap but doesn't use;sleep study done 3-1yrs ago  . Urinary frequency     Past Surgical History:  Procedure Laterality Date  . ANTERIOR CERVICAL DECOMP/DISCECTOMY FUSION  2008  . APPENDECTOMY  2007  . BREAST BIOPSY Right 02/28/2003   malignant  . BREAST LUMPECTOMY  2004  . CESAREAN SECTION  47yrs ago  . COLONOSCOPY    . COLONOSCOPY    . KNEE ARTHROSCOPY  2007  . KNEE ARTHROSCOPY  05/02/2011   Procedure: ARTHROSCOPY KNEE;  Surgeon: Johnn Hai;  Location: [place];  Service: Orthopedics;  Laterality: Right;  . MENISCUS DEBRIDEMENT  05/02/2011   Procedure: DEBRIDEMENT OF MENISCUS;  Surgeon: Johnn Hai;  Location: [place];  Service: Orthopedics;  Laterality: Right;  . porta cath remove  2005  . PORTACATH  PLACEMENT  2004  . portacath removal    . THYROID LOBECTOMY Right 09/22/2012   Dr Constance Holster  . THYROIDECTOMY N/A 09/22/2012   Procedure: RIGHT THYROID LOBECTOMY WITH FROZEN SECTION;  Surgeon: Izora Gala, MD;  Location: [place];  Service: ENT;  Laterality: N/A;  . THYROIDECTOMY Left 10/21/2012   Procedure: COMPLETION OF THYROIDECTOMY;  Surgeon: Izora Gala, MD;  Location: [place];  Service: ENT;  Laterality: Left;  . TOTAL KNEE ARTHROPLASTY  04/14/2012   Procedure: TOTAL KNEE ARTHROPLASTY;  Surgeon: Ninetta Lights, MD;  Location: [place];  Service: Orthopedics;  Laterality: Right;  RIGHT ARTHROPLASTY KNEE MEDIAL/LATERAL COMPARTMENTS WITH PATELLA RESURFACING  . TOTAL KNEE ARTHROPLASTY Left 03/30/2013   Procedure: TOTAL KNEE ARTHROPLASTY;  Surgeon: Ninetta Lights, MD;  Location: [place];  Service: Orthopedics;  Laterality: Left;    There were no vitals filed for this visit.      Subjective Assessment - 02/05/17 0806    Subjective The DN on my hand really helped.  Today is a good day.     Pertinent History hx of breast CA;  2 TKRs; HTN;  neck surgery 10 years ago with arm pain.  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     Currently in Pain? Yes   Pain  Score 5    Pain Location Elbow   Pain Orientation Right   Pain Type Chronic pain   Multiple Pain Sites Yes   Pain Score 5   Pain Location Hand   Pain Orientation Left   Pain Type Chronic pain            [facility] PT Assessment - 02/05/17 0001      AROM   Right Shoulder Flexion 150 Degrees   Right Shoulder ABduction 155 Degrees   Left Shoulder Flexion 160 Degrees   Left Shoulder ABduction 165 Degrees     Strength   Right Hand Grip (lbs) 35   Left Hand Grip (lbs) 20  painful                     [facility] Adult PT Treatment/Exercise - 02/05/17 0001      Shoulder Exercises: Standing   Extension Strengthening;15 reps;Weights   Extension Weight (lbs) 15# power tower   Shoulder Elevation Limitations wall snow angels 10x    Other Standing Exercises UE Ranger level 19 20x right/left   Other Standing Exercises wall push ups 15x      Shoulder Exercises: Stretch   Other Shoulder Stretches doorway pec, lat and psoas doorway 3x5     Manual Therapy   Joint Mobilization right GH mobs grade 2/3 distraction, inferior mobs with movement    Soft tissue mobilization pectoral stretch    Myofascial Release UE neural gliding 15x                  PT Short Term Goals - 02/05/17 0857      PT SHORT TERM GOAL #1   Title The patient will demonstrate a good understanding of modifications for work/home to support posture and correct wrist positioning   Status Achieved     PT SHORT TERM GOAL #2   Title pt will be independent with initial HEP   Status Achieved     PT SHORT TERM GOAL #3   Title The patient will have knowledge and demonstrate compliance with initial HEP for core muscle activation   Status Achieved     PT SHORT TERM GOAL #4   Title Thoracic and lumbar extension improved to 25 degrees needed for standing erect   Status Achieved           PT Long Term Goals - 02/05/17 0857      PT LONG TERM GOAL #1   Title The patient will be independent in safe self progression of HEP needed for further improvements in ROM and strength, function and decreased pain   Time 8   Period Weeks   Status On-going     PT LONG TERM GOAL #2   Title The patient will improve bil UE AROM to painfree    Time 8   Period Weeks   Status On-going     PT LONG TERM GOAL #3   Title the patient will improve bil elbow and wrist strength to 4+/5 or greater   Time 8   Period Weeks   Status On-going     PT LONG TERM GOAL #4   Title Periscapular muscle strength grossly 4+/5 needed for greater ease with lifting   Time 8   Period Weeks   Status On-going     PT LONG TERM GOAL #5   Title FOTO functional outcome improved to 30% indicating improved function with less pain    Time 8   Period Weeks  Status On-going                Plan - 02/05/17 0843    Clinical Impression Statement  Treatment focus on right shoulder mobility and bilateral periscapular strengthening.  She is able to participate in a progression of exercise without exacerbation of pain.  Slower to meet goals secondary to chronic nature and multiple body regions affected.  Anticipate 2 more weeks of PT before discharge to independent HEP.   Rehab Potential Good   Clinical Impairments Affecting Rehab Potential bil UE pain; no ultrasound due to breast cancer   PT Duration 8 weeks   PT Treatment/Interventions ADLs/Self Care Home Management;Electrical Stimulation;Cryotherapy;Moist Heat;Traction;Therapeutic exercise;Therapeutic activities;Neuromuscular re-education;Patient/family education;Manual techniques;Taping;Dry needling   PT Next Visit Plan  UE Ranger for overhead work on right;  right shoulder ROM flexion and abduction;  Manual techniques and dry needling right wrist extensors and flexors and  left opponens pollicis;  exercise as tolerated; periscapular strengthening; kinesiotaping;  [place]      Patient will benefit from skilled therapeutic intervention in order to improve the following deficits and impairments:  Decreased activity tolerance, Decreased range of motion, Decreased strength, Postural dysfunction, Improper body mechanics, Pain, Impaired UE functional use, Increased muscle spasms  Visit Diagnosis: Pain in right elbow  Muscle weakness (generalized)  Thumb pain, left     Problem List Patient Active Problem List   Diagnosis Date Noted  . DJD (degenerative joint disease) of knee 03/30/2013  . Unspecified sleep apnea 03/23/2013  . 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 Weakness 11/28/2011  . Hypertension 11/28/2011  . Hypokalemia 11/28/2011  . LUE weakness 11/28/2011  . Hypothyroidism 11/28/2011  . Breast cancer ([place]) 06/19/2011   Ruben Im, PT 02/05/17 8:58 AM Phone: [phone] Fax: [phone]  Alvera Singh 02/05/2017, 8:58 AM  [facility] [address] [place], [place], [address] Phone: [phone]   Fax:  [phone]  Name: [name] MRN: [id number] Date of Birth: 07/23/1950

## 2017-02-10 ENCOUNTER — Ambulatory Visit: Payer: 59 | Admitting: Physical Therapy

## 2017-02-10 DIAGNOSIS — M6281 Muscle weakness (generalized): Secondary | ICD-10-CM | POA: Diagnosis not present

## 2017-02-10 DIAGNOSIS — M25521 Pain in right elbow: Secondary | ICD-10-CM | POA: Diagnosis not present

## 2017-02-10 DIAGNOSIS — R293 Abnormal posture: Secondary | ICD-10-CM | POA: Diagnosis not present

## 2017-02-10 DIAGNOSIS — M79645 Pain in left finger(s): Secondary | ICD-10-CM

## 2017-02-10 NOTE — Therapy (Signed)
[facility] [address], [place], [place], [address] Phone: [phone]   Fax:  [phone]  Physical Therapy Treatment  Patient Details  Name: [name] MRN: [id number] Date of Birth: 07-31-1950 Referring Provider: Dr. Edmonia Lynch  Encounter Date: 02/10/2017      PT End of Session - 02/10/17 0745    Visit Number 15   Number of Visits 20   Date for PT Re-Evaluation 03/03/17   Authorization Type Medicare G codes;  KX at visit 15   PT Start Time 0728   PT Stop Time 0803  moist heat   PT Time Calculation (min) 35 min   Activity Tolerance Patient tolerated treatment well      Past Medical History:  Diagnosis Date  . Arthritis   . Breast cancer ([place])   . Bruises easily   . Cancer [facility]) 2004   rt breast;takes Arimidex daily;thyroid cancer  . History of bronchitis    when she was young  . Hypertension    takes Tribenzor daily  . Hypothyroidism    takes Synthroid daily  . Joint pain   . Joint swelling   . Personal history of chemotherapy 04/2003  . Personal history of radiation therapy 2005  . Pneumonia    hx of when she was young  . Sleep apnea    has cpap but doesn't use;sleep study done 3-15yrs ago  . Urinary frequency     Past Surgical History:  Procedure Laterality Date  . ANTERIOR CERVICAL DECOMP/DISCECTOMY FUSION  2008  . APPENDECTOMY  2007  . BREAST BIOPSY Right 02/28/2003   malignant  . BREAST LUMPECTOMY  2004  . CESAREAN SECTION  69yrs ago  . COLONOSCOPY    . COLONOSCOPY    . KNEE ARTHROSCOPY  2007  . KNEE ARTHROSCOPY  05/02/2011   Procedure: ARTHROSCOPY KNEE;  Surgeon: Johnn Hai;  Location: [place];  Service: Orthopedics;  Laterality: Right;  . MENISCUS DEBRIDEMENT  05/02/2011   Procedure: DEBRIDEMENT OF MENISCUS;  Surgeon: Johnn Hai;  Location: [place];  Service: Orthopedics;  Laterality: Right;  . porta cath remove  2005  .  PORTACATH PLACEMENT  2004  . portacath removal    . THYROID LOBECTOMY Right 09/22/2012   Dr Constance Holster  . THYROIDECTOMY N/A 09/22/2012   Procedure: RIGHT THYROID LOBECTOMY WITH FROZEN SECTION;  Surgeon: Izora Gala, MD;  Location: [place];  Service: ENT;  Laterality: N/A;  . THYROIDECTOMY Left 10/21/2012   Procedure: COMPLETION OF THYROIDECTOMY;  Surgeon: Izora Gala, MD;  Location: [place];  Service: ENT;  Laterality: Left;  . TOTAL KNEE ARTHROPLASTY  04/14/2012   Procedure: TOTAL KNEE ARTHROPLASTY;  Surgeon: Ninetta Lights, MD;  Location: [place];  Service: Orthopedics;  Laterality: Right;  RIGHT ARTHROPLASTY KNEE MEDIAL/LATERAL COMPARTMENTS WITH PATELLA RESURFACING  . TOTAL KNEE ARTHROPLASTY Left 03/30/2013   Procedure: TOTAL KNEE ARTHROPLASTY;  Surgeon: Ninetta Lights, MD;  Location: [place];  Service: Orthopedics;  Laterality: Left;    There were no vitals filed for this visit.      Subjective Assessment - 02/10/17 0728    Subjective I had a misstep and twisted  my foot yesterday.  Today it's OK.  Right medial elbow pain this morning.  States left thumb "not much at all"  but rates it a 5/10.     Pertinent History hx of breast CA;  2 TKRs; HTN;  neck surgery 10 years ago with  arm pain.  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     Currently in Pain? Yes   Pain Score 6    Pain Location Elbow   Pain Orientation Right   Pain Type Chronic pain   Pain Score 5   Pain Location Hand   Pain Orientation Left   Pain Type Chronic pain                         [facility] Adult PT Treatment/Exercise - 02/10/17 0001      Shoulder Exercises: Standing   Extension Strengthening;15 reps;Weights   Extension Weight (lbs) 15# power tower   Row Strengthening;Both;15 reps   Row Weight (lbs) 15# power tower   Shoulder Elevation Limitations single arm push ups 10x right/left   Other Standing Exercises UE Ranger level 20 20x right/left   Other Standing Exercises ball circles left only 20x   discontinued on right secondary to pain     Shoulder Exercises: ROM/Strengthening   Other ROM/Strengthening Exercises ball on wall flexion 10x     Moist Heat Therapy   Number Minutes Moist Heat 5 Minutes   Moist Heat Location --  right elbow     Manual Therapy   Soft tissue mobilization right wrist flexors, extensors, pronators, biceps and triceps          Trigger Point Dry Needling - 02/10/17 0801    Consent Given? Yes   Muscles Treated Upper Body --  right wrist flexors                PT Short Term Goals - 02/05/17 0857      PT SHORT TERM GOAL #1   Title The patient will demonstrate a good understanding of modifications for work/home to support posture and correct wrist positioning   Status Achieved     PT SHORT TERM GOAL #2   Title pt will be independent with initial HEP   Status Achieved     PT SHORT TERM GOAL #3   Title The patient will have knowledge and demonstrate compliance with initial HEP for core muscle activation   Status Achieved     PT SHORT TERM GOAL #4   Title Thoracic and lumbar extension improved to 25 degrees needed for standing erect   Status Achieved           PT Long Term Goals - 02/05/17 0857      PT LONG TERM GOAL #1   Title The patient will be independent in safe self progression of HEP needed for further improvements in ROM and strength, function and decreased pain   Time 8   Period Weeks   Status On-going     PT LONG TERM GOAL #2   Title The patient will improve bil UE AROM to painfree    Time 8   Period Weeks   Status On-going     PT LONG TERM GOAL #3   Title the patient will improve bil elbow and wrist strength to 4+/5 or greater   Time 8   Period Weeks   Status On-going     PT LONG TERM GOAL #4   Title Periscapular muscle strength grossly 4+/5 needed for greater ease with lifting   Time 8   Period Weeks   Status On-going     PT LONG TERM GOAL #5   Title FOTO functional outcome improved to 30% indicating  improved function with less pain  Time 8   Period Weeks   Status On-going               Plan - 02/10/17 0745    Clinical Impression Statement The patient reports overall improvements but continues to rate her pain at a moderate level usually around 5/10.  Exercises modified secondary to right elbow pain.  She reports her shoulders "feel tired" after exercises.  Tender points in right wrist flexor musculature but decreased size and number following dry needling and manual therapy.     Rehab Potential Good   Clinical Impairments Affecting Rehab Potential bil UE pain; no ultrasound due to breast cancer   PT Frequency 2x / week   PT Duration 8 weeks   PT Treatment/Interventions ADLs/Self Care Home Management;Electrical Stimulation;Cryotherapy;Moist Heat;Traction;Therapeutic exercise;Therapeutic activities;Neuromuscular re-education;Patient/family education;Manual techniques;Taping;Dry needling   PT Next Visit Plan recheck ROM/strength next visit.  Anticipate discharge in 1-3 visits;  upper quarter strengthening and modalities as needed; Graston myofascial      Patient will benefit from skilled therapeutic intervention in order to improve the following deficits and impairments:  Decreased activity tolerance, Decreased range of motion, Decreased strength, Postural dysfunction, Improper body mechanics, Pain, Impaired UE functional use, Increased muscle spasms  Visit Diagnosis: Pain in right elbow  Muscle weakness (generalized)  Thumb pain, left     Problem List Patient Active Problem List   Diagnosis Date Noted  . DJD (degenerative joint disease) of knee 03/30/2013  . Unspecified sleep apnea 03/23/2013  . 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 Weakness 11/28/2011  . Hypertension 11/28/2011  . Hypokalemia 11/28/2011  . LUE weakness 11/28/2011  . Hypothyroidism 11/28/2011  . Breast cancer ([place]) 06/19/2011   Ruben Im, PT 02/10/17 5:02 PM Phone: [phone] Fax:  [phone]  Alvera Singh 02/10/2017, Peter Garter PM  [facility] [address], [place], [place], [address] Phone: [phone]   Fax:  [phone]  Name: [name] MRN: [id number] Date of Birth: [birth year]

## 2017-02-12 ENCOUNTER — Encounter: Payer: Medicare Other | Admitting: Physical Therapy

## 2017-02-17 ENCOUNTER — Ambulatory Visit: Payer: 59 | Admitting: Physical Therapy

## 2017-02-17 DIAGNOSIS — R293 Abnormal posture: Secondary | ICD-10-CM

## 2017-02-17 DIAGNOSIS — M6281 Muscle weakness (generalized): Secondary | ICD-10-CM

## 2017-02-17 DIAGNOSIS — M25521 Pain in right elbow: Secondary | ICD-10-CM

## 2017-02-17 DIAGNOSIS — M79645 Pain in left finger(s): Secondary | ICD-10-CM | POA: Diagnosis not present

## 2017-02-17 NOTE — Therapy (Signed)
[facility] [address], [place], [place], [address] Phone: [phone]   Fax:  [phone]  Physical Therapy Treatment  Patient Details  Name: [name] MRN: [id number] Date of Birth: 09-06-1950 Referring Provider: Dr. Edmonia Lynch  Encounter Date: 02/17/2017      PT End of Session - 02/17/17 1042    Visit Number 16   Number of Visits 20   Date for PT Re-Evaluation 03/03/17   Authorization Type Medicare G codes;  KX at visit 15   PT Start Time 0728   PT Stop Time 0810  moist heat and DN   PT Time Calculation (min) 42 min   Activity Tolerance Patient tolerated treatment well      Past Medical History:  Diagnosis Date  . Arthritis   . Breast cancer ([place])   . Bruises easily   . Cancer [facility]) 2004   rt breast;takes Arimidex daily;thyroid cancer  . History of bronchitis    when she was young  . Hypertension    takes Tribenzor daily  . Hypothyroidism    takes Synthroid daily  . Joint pain   . Joint swelling   . Personal history of chemotherapy 04/2003  . Personal history of radiation therapy 2005  . Pneumonia    hx of when she was young  . Sleep apnea    has cpap but doesn't use;sleep study done 3-17yrs ago  . Urinary frequency     Past Surgical History:  Procedure Laterality Date  . ANTERIOR CERVICAL DECOMP/DISCECTOMY FUSION  2008  . APPENDECTOMY  2007  . BREAST BIOPSY Right 02/28/2003   malignant  . BREAST LUMPECTOMY  2004  . CESAREAN SECTION  48yrs ago  . COLONOSCOPY    . COLONOSCOPY    . KNEE ARTHROSCOPY  2007  . KNEE ARTHROSCOPY  05/02/2011   Procedure: ARTHROSCOPY KNEE;  Surgeon: Johnn Hai;  Location: [place];  Service: Orthopedics;  Laterality: Right;  . MENISCUS DEBRIDEMENT  05/02/2011   Procedure: DEBRIDEMENT OF MENISCUS;  Surgeon: Johnn Hai;  Location: [place];  Service: Orthopedics;  Laterality: Right;  . porta cath remove  2005   . PORTACATH PLACEMENT  2004  . portacath removal    . THYROID LOBECTOMY Right 09/22/2012   Dr Constance Holster  . THYROIDECTOMY N/A 09/22/2012   Procedure: RIGHT THYROID LOBECTOMY WITH FROZEN SECTION;  Surgeon: Izora Gala, MD;  Location: [place];  Service: ENT;  Laterality: N/A;  . THYROIDECTOMY Left 10/21/2012   Procedure: COMPLETION OF THYROIDECTOMY;  Surgeon: Izora Gala, MD;  Location: [place];  Service: ENT;  Laterality: Left;  . TOTAL KNEE ARTHROPLASTY  04/14/2012   Procedure: TOTAL KNEE ARTHROPLASTY;  Surgeon: Ninetta Lights, MD;  Location: [place];  Service: Orthopedics;  Laterality: Right;  RIGHT ARTHROPLASTY KNEE MEDIAL/LATERAL COMPARTMENTS WITH PATELLA RESURFACING  . TOTAL KNEE ARTHROPLASTY Left 03/30/2013   Procedure: TOTAL KNEE ARTHROPLASTY;  Surgeon: Ninetta Lights, MD;  Location: [place];  Service: Orthopedics;  Laterality: Left;    There were no vitals filed for this visit.      Subjective Assessment - 02/17/17 0727    Subjective I'm fighting getting a cold, that's why I cancelled last week.  My left hand hurt yesterday with shooting pains to my wrist while at work.     Pertinent History hx of breast CA;  2 TKRs; HTN;  neck surgery 10 years ago with arm pain.  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  My left elbow has not   Currently in Pain? Yes   Pain Score 5    Pain Location Elbow   Pain Orientation Right   Pain Type Chronic pain   Aggravating Factors  it's random   Pain Relieving Factors customized work station   Multiple Pain Sites Yes   Pain Score 6   Pain Location Hand   Pain Orientation Left   Pain Type Chronic pain   Aggravating Factors  it's random                         [facility] Adult PT Treatment/Exercise - 02/17/17 0001      Shoulder Exercises: Standing   Extension Strengthening;15 reps;Weights   Extension Weight (lbs) 20# power tower   Row Strengthening;Both;15 reps   Row Weight (lbs) 20# power tower   Shoulder Elevation Limitations  single arm push ups 10x right/left   Other Standing Exercises UE Ranger level 20 20x right/left     Manual Therapy   Soft tissue mobilization left opponens pollicis   Myofascial Release Graston to left opponens          Trigger Point Dry Needling - 02/17/17 1041    Consent Given? Yes   Muscles Treated Upper Body --  left opponens pollicis        Moist heat 10 min        PT Short Term Goals - 02/05/17 0857      PT SHORT TERM GOAL #1   Title The patient will demonstrate a good understanding of modifications for work/home to support posture and correct wrist positioning   Status Achieved     PT SHORT TERM GOAL #2   Title pt will be independent with initial HEP   Status Achieved     PT SHORT TERM GOAL #3   Title The patient will have knowledge and demonstrate compliance with initial HEP for core muscle activation   Status Achieved     PT SHORT TERM GOAL #4   Title Thoracic and lumbar extension improved to 25 degrees needed for standing erect   Status Achieved           PT Long Term Goals - 02/05/17 0857      PT LONG TERM GOAL #1   Title The patient will be independent in safe self progression of HEP needed for further improvements in ROM and strength, function and decreased pain   Time 8   Period Weeks   Status On-going     PT LONG TERM GOAL #2   Title The patient will improve bil UE AROM to painfree    Time 8   Period Weeks   Status On-going     PT LONG TERM GOAL #3   Title the patient will improve bil elbow and wrist strength to 4+/5 or greater   Time 8   Period Weeks   Status On-going     PT LONG TERM GOAL #4   Title Periscapular muscle strength grossly 4+/5 needed for greater ease with lifting   Time 8   Period Weeks   Status On-going     PT LONG TERM GOAL #5   Title FOTO functional outcome improved to 30% indicating improved function with less pain    Time 8   Period Weeks   Status On-going               Plan - 02/17/17  1043     Clinical Impression Statement The patient has tender points in left opponens pollicis improved following DN and myofasical release with Graston and manual soft tissue mobilization.  She has muscular fatigue with UE postural strengthening.  Slower to meet rehab goals secondary chronicity and multi region body pain.     Rehab Potential Good   Clinical Impairments Affecting Rehab Potential bil UE pain; no ultrasound due to breast cancer   PT Frequency 2x / week   PT Duration 8 weeks   PT Treatment/Interventions ADLs/Self Care Home Management;Electrical Stimulation;Cryotherapy;Moist Heat;Traction;Therapeutic exercise;Therapeutic activities;Neuromuscular re-education;Patient/family education;Manual techniques;Taping;Dry needling   PT Next Visit Plan recheck ROM/strength next visit.  Anticipate discharge in 1-3 visits;  upper quarter strengthening and modalities as needed; Graston myofascial; KX      Patient will benefit from skilled therapeutic intervention in order to improve the following deficits and impairments:  Decreased activity tolerance, Decreased range of motion, Decreased strength, Postural dysfunction, Improper body mechanics, Pain, Impaired UE functional use, Increased muscle spasms  Visit Diagnosis: Pain in right elbow  Muscle weakness (generalized)  Thumb pain, left  Abnormal posture     Problem List Patient Active Problem List   Diagnosis Date Noted  . DJD (degenerative joint disease) of knee 03/30/2013  . Unspecified sleep apnea 03/23/2013  . 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 Weakness 11/28/2011  . Hypertension 11/28/2011  . Hypokalemia 11/28/2011  . LUE weakness 11/28/2011  . Hypothyroidism 11/28/2011  . Breast cancer ([place]) 06/19/2011   Ruben Im, PT 02/17/17 10:53 AM Phone: [phone] Fax: [phone]  Alvera Singh 02/17/2017, 10:52 AM  [facility] [address], [place], [place],  [address] Phone: [phone]   Fax:  [phone]  Name: [name] MRN: [id number] Date of Birth: [birth year]

## 2017-02-19 ENCOUNTER — Ambulatory Visit: Payer: 59 | Admitting: Physical Therapy

## 2017-02-19 DIAGNOSIS — R293 Abnormal posture: Secondary | ICD-10-CM | POA: Diagnosis not present

## 2017-02-19 DIAGNOSIS — M6281 Muscle weakness (generalized): Secondary | ICD-10-CM

## 2017-02-19 DIAGNOSIS — M79645 Pain in left finger(s): Secondary | ICD-10-CM | POA: Diagnosis not present

## 2017-02-19 DIAGNOSIS — M25521 Pain in right elbow: Secondary | ICD-10-CM | POA: Diagnosis not present

## 2017-02-19 NOTE — Therapy (Signed)
[facility] [address] [place], [place], [address] Phone: [phone]   Fax:  [phone]  Physical Therapy Treatment/Discharge Summary  Patient Details  Name: [name] MRN: [id number] Date of Birth: Jul 11, 1950 Referring Provider: Dr. Edmonia Lynch  Encounter Date: 02/19/2017      PT End of Session - 02/19/17 0757    Visit Number 17   Number of Visits 20   Date for PT Re-Evaluation 03/03/17   Authorization Type Medicare G codes;  KX at visit 15   PT Start Time 0731   PT Stop Time 0759   PT Time Calculation (min) 28 min   Activity Tolerance Patient tolerated treatment well      Past Medical History:  Diagnosis Date  . Arthritis   . Breast cancer ([place])   . Bruises easily   . Cancer [facility]) 2004   rt breast;takes Arimidex daily;thyroid cancer  . History of bronchitis    when she was young  . Hypertension    takes Tribenzor daily  . Hypothyroidism    takes Synthroid daily  . Joint pain   . Joint swelling   . Personal history of chemotherapy 04/2003  . Personal history of radiation therapy 2005  . Pneumonia    hx of when she was young  . Sleep apnea    has cpap but doesn't use;sleep study done 3-16yr ago  . Urinary frequency     Past Surgical History:  Procedure Laterality Date  . ANTERIOR CERVICAL DECOMP/DISCECTOMY FUSION  2008  . APPENDECTOMY  2007  . BREAST BIOPSY Right 02/28/2003   malignant  . BREAST LUMPECTOMY  2004  . CESAREAN SECTION  35yrago  . COLONOSCOPY    . COLONOSCOPY    . KNEE ARTHROSCOPY  2007  . KNEE ARTHROSCOPY  05/02/2011   Procedure: ARTHROSCOPY KNEE;  Surgeon: JeJohnn Hai [place] Service: Orthopedics;  Laterality: Right;  . MENISCUS DEBRIDEMENT  05/02/2011   Procedure: DEBRIDEMENT OF MENISCUS;  Surgeon: JeJohnn Hai [place] [facility] Service: Orthopedics;  Laterality: Right;  . porta cath remove  2005   . PORTACATH PLACEMENT  2004  . portacath removal    . THYROID LOBECTOMY Right 09/22/2012   Dr RoConstance Holster. THYROIDECTOMY N/A 09/22/2012   Procedure: RIGHT THYROID LOBECTOMY WITH FROZEN SECTION;  Surgeon: JeIzora GalaMD;  [facility] Service: ENT;  Laterality: N/A;  . THYROIDECTOMY Left 10/21/2012   Procedure: COMPLETION OF THYROIDECTOMY;  Surgeon: JeIzora GalaMD;  [place] [facility] Service: ENT;  Laterality: Left;  . TOTAL KNEE ARTHROPLASTY  04/14/2012   Procedure: TOTAL KNEE ARTHROPLASTY;  Surgeon: DaNinetta LightsMD;  [place] [facility] Service: [place]opedics;  Laterality: Right;  RIGHT ARTHROPLASTY KNEE MEDIAL/LATERAL COMPARTMENTS WITH PATELLA RESURFACING  . TOTAL KNEE ARTHROPLASTY Left 03/30/2013   Procedure: TOTAL KNEE ARTHROPLASTY;  Surgeon: DaNinetta LightsMD;  [facility] Service: Orthopedics;  Laterality: Left;    There were no vitals filed for this visit.      Subjective Assessment - 02/19/17 0731    Subjective I'm OK.  My left hand felt weird yesterday while typing.  It may be the cold.  Right elbow has been pretty good.     Pertinent History hx of breast CA;  2 TKRs; HTN;  neck surgery 10 years ago with arm pain.  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  My left elbow has not   Limitations Lifting   Diagnostic tests MRI;  Xray;  will do another MRI of another part of back   Patient Stated Goals Decrease the pain in both UEs   Currently in Pain? Yes   Pain Score 5    Pain Location Elbow   Multiple Pain Sites Yes   Pain Score 6   Pain Location Hand            [facility] PT Assessment - 02/19/17 0001      Observation/Other Assessments   Focus on Therapeutic Outcomes (FOTO)  41% limitation     AROM   Overall AROM Comments IR and er WFLS bil   AROM Assessment Site --  full elbow and wrist ROM bil   Right Shoulder Flexion 157 Degrees   Right Shoulder ABduction 168 Degrees   Left Shoulder Flexion 170 Degrees   Left Shoulder ABduction 165 Degrees      Strength   Overall Strength --  periscapular 4+/5   Right Elbow Flexion 4+/5   Right Elbow Extension 4+/5   Right Forearm Pronation 4/5   Right Forearm Supination 4/5   Left Forearm Pronation 4/5   Left Forearm Supination 4/5   Right Wrist Flexion 4/5   Right Wrist Extension 4/5   Left Wrist Flexion 4/5   Left Wrist Extension 4/5   Right Hand Grip (lbs) 15  painful in finger today   Left Hand Grip (lbs) 20                     [facility] Adult PT Treatment/Exercise - 02/19/17 0001      Therapeutic Activites    Therapeutic Activities ADL's   ADL's reaching, gripping, pulling     Shoulder Exercises: Standing   Extension Strengthening;15 reps;Weights   Extension Weight (lbs) 20# power tower   Row Strengthening;Both;15 reps   Row Weight (lbs) 20# power tower   Other Standing Exercises UE Ranger level 23 20x right/left     Shoulder Exercises: ROM/Strengthening   Other ROM/Strengthening Exercises UE isometrics                PT Education - 02/19/17 0757    Education provided Yes   Education Details isometrics   Person(s) Educated Patient   Methods Explanation;Demonstration;Handout   Comprehension Verbalized understanding;Returned demonstration          PT Short Term Goals - 02/19/17 0751      PT SHORT TERM GOAL #1   Title The patient will demonstrate a good understanding of modifications for work/home to support posture and correct wrist positioning   Status Achieved     PT SHORT TERM GOAL #2   Title pt will be independent with initial HEP   Status Achieved           PT Long Term Goals - 02/19/17 0749      PT LONG TERM GOAL #1   Title The patient will be independent in safe self progression of HEP needed for further improvements in ROM and strength, function and decreased pain   Status Achieved     PT LONG TERM GOAL #2   Title The patient will improve bil UE AROM to painfree    Status Achieved     PT LONG TERM GOAL #3   Title the  patient will improve bil elbow and wrist strength to 4+/5 or greater   Baseline 80% overall improvement   Status Partially Met  PT LONG TERM GOAL #4   Title Periscapular muscle strength grossly 4+/5 needed for greater ease with lifting   Status Partially Met     PT LONG TERM GOAL #5   Title FOTO functional outcome improved to 30% indicating improved function with less pain    Status Partially Met               Plan - 2017-03-05 1121    Clinical Impression Statement The patient reports her right elbow pain and left hand problem  is 75-80% better overall.  Her FOTO indicates a slight improvement.  She has made good improvements in bil UE ROM and movement is now painfree.  Improved periscapular strength as well.  Her grip strength continues to be decreased.  She has a comprehensive HEP for further improvements in strength and maintenance of ROM.  Her progress has started to plateau with no further progress toward goals.  Will discharge from PT at this time with partial goals met.        Patient will benefit from skilled therapeutic intervention in order to improve the following deficits and impairments:     Visit Diagnosis: Pain in right elbow  Muscle weakness (generalized)  Thumb pain, left       G-Codes - 2017-03-05 1126    Functional Assessment Tool Used (Outpatient Only) FOTO   Functional Limitation Mobility: Walking and moving around   Mobility: Walking and Moving Around Goal Status [phone]) At least 20 percent but less than 40 percent impaired, limited or restricted   Mobility: Walking and Moving Around Discharge Status [phone]) At least 20 percent but less than 40 percent impaired, limited or restricted     PHYSICAL THERAPY DISCHARGE SUMMARY  Visits from Start of Care: 17  Current functional level related to goals / functional outcomes: See clinical impressions above   Remaining deficits: As above   Education / Equipment: Comprehensive HEP  Plan: Patient  agrees to discharge.  Patient goals were partially met. Patient is being discharged due to being pleased with the current functional level.  ?????        Problem List Patient Active Problem List   Diagnosis Date Noted  . DJD (degenerative joint disease) of knee 03/30/2013  . Unspecified sleep apnea 03/23/2013  . 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 Weakness 11/28/2011  . Hypertension 11/28/2011  . Hypokalemia 11/28/2011  . LUE weakness 11/28/2011  . Hypothyroidism 11/28/2011  . Breast cancer ([place]) 06/19/2011   Ruben Im, PT 03-05-17 11:28 AM Phone: [phone] Fax: [phone]  Alvera Singh 03-05-17, 11:28 AM  [facility] [address] [place], [place], [address] Phone: [phone]   Fax:  [phone]  Name: [name] MRN: [id number] Date of Birth: Aug 29, 1950

## 2017-02-19 NOTE — Patient Instructions (Signed)
  Strengthening: Isometric Flexion  Using wall for resistance, press right fist into ball using light pressure. Hold __5__ seconds. Repeat _5___ times per set. Do __1__ sets per session. Do __1-2__ sessions per day.  SHOULDER: Abduction (Isometric)  Use wall as resistance. Press arm against pillow. Keep elbow straight. Hold __5_ seconds. _5__ reps per set, _1__ sets per day, _1-2__ days per week  Extension (Isometric)  Place left bent elbow and back of arm against wall. Press elbow against wall. Hold __5__ seconds. Repeat _5___ times. Do __1-2__ sessions per day.  Internal Rotation (Isometric)  Place palm of right fist against door frame, with elbow bent. Press fist against door frame. Hold ____ seconds. Repeat ____ times. Do ____ sessions per day.  External Rotation (Isometric)  Place back of left fist against door frame, with elbow bent. Press fist against door frame. Hold ___5_ seconds. Repeat __5__ times. Do __1-2__ sessions per day.  Copyright  VHI. All rights reserved.      Ruben Im PT [facility] [address], [place], [place] [address] Phone # [phone] Fax [phone]

## 2017-03-28 HISTORY — PX: BREAST BIOPSY: SHX20

## 2017-04-08 ENCOUNTER — Ambulatory Visit
Admission: RE | Admit: 2017-04-08 | Discharge: 2017-04-08 | Disposition: A | Discharge status: Home / Self Care | Payer: 59 | Source: Ambulatory Visit | Attending: Oncology | Admitting: Oncology

## 2017-04-08 ENCOUNTER — Other Ambulatory Visit: Payer: Self-pay | Admitting: Oncology

## 2017-04-08 DIAGNOSIS — R921 Mammographic calcification found on diagnostic imaging of breast: Secondary | ICD-10-CM | POA: Diagnosis not present

## 2017-04-09 ENCOUNTER — Ambulatory Visit
Admission: RE | Admit: 2017-04-09 | Discharge: 2017-04-09 | Disposition: A | Discharge status: Home / Self Care | Payer: 59 | Source: Ambulatory Visit | Attending: Oncology | Admitting: Oncology

## 2017-04-09 DIAGNOSIS — D0512 Intraductal carcinoma in situ of left breast: Secondary | ICD-10-CM | POA: Diagnosis not present

## 2017-04-09 DIAGNOSIS — R921 Mammographic calcification found on diagnostic imaging of breast: Secondary | ICD-10-CM | POA: Diagnosis not present

## 2017-04-14 DIAGNOSIS — D0512 Intraductal carcinoma in situ of left breast: Secondary | ICD-10-CM | POA: Diagnosis not present

## 2017-04-17 ENCOUNTER — Other Ambulatory Visit: Payer: 59

## 2017-04-17 ENCOUNTER — Encounter: Payer: Self-pay | Admitting: Genetic Counselor

## 2017-04-17 ENCOUNTER — Ambulatory Visit (HOSPITAL_BASED_OUTPATIENT_CLINIC_OR_DEPARTMENT_OTHER): Payer: 59 | Admitting: Genetic Counselor

## 2017-04-17 ENCOUNTER — Telehealth: Payer: Self-pay | Admitting: Oncology

## 2017-04-17 DIAGNOSIS — Z803 Family history of malignant neoplasm of breast: Secondary | ICD-10-CM | POA: Diagnosis not present

## 2017-04-17 DIAGNOSIS — C73 Malignant neoplasm of thyroid gland: Secondary | ICD-10-CM

## 2017-04-17 DIAGNOSIS — Z808 Family history of malignant neoplasm of other organs or systems: Secondary | ICD-10-CM | POA: Diagnosis not present

## 2017-04-17 DIAGNOSIS — Z7183 Encounter for nonprocreative genetic counseling: Secondary | ICD-10-CM | POA: Diagnosis not present

## 2017-04-17 DIAGNOSIS — C50911 Malignant neoplasm of unspecified site of right female breast: Secondary | ICD-10-CM | POA: Diagnosis not present

## 2017-04-17 DIAGNOSIS — Z1379 Encounter for other screening for genetic and chromosomal anomalies: Secondary | ICD-10-CM

## 2017-04-17 DIAGNOSIS — Z85850 Personal history of malignant neoplasm of thyroid: Secondary | ICD-10-CM | POA: Diagnosis not present

## 2017-04-17 HISTORY — DX: Encounter for other screening for genetic and chromosomal anomalies: Z13.79

## 2017-04-17 NOTE — Progress Notes (Signed)
[facility]      Initial Visit   Patient Name: [name] Patient DOB: 08-02-1950 Patient Age: 66 y.o. Encounter Date: 04/17/2017  Referring Provider: Autumn Messing, MD  Primary Care Provider: Lucianne Lei, MD  Reason for Visit: Evaluate for hereditary susceptibility to cancer    Assessment and Plan:  . [name]' history is not highly suggestive of a hereditary predisposition to cancer. However, given her own diagnosis of bilateral breast cancers, a genetics evaluation is indicated. We discussed that her father's side of the family is small and he had no sisters. This paucity of women makes risk assessment challenging.   . Testing is recommended to determine whether she has a pathogenic mutation that will impact her screening and risk-reduction for cancer. A negative result will be reassuring.  . [name] wished to pursue genetic testing and a blood sample will be [facility] to [facility]. [facility]'s STAT breast panel was requested as it will impact surgical decisions. Results should be available in about 7-12 days. The 9 genes on this panel are ATM, BRCA1, BRCA2, CDH1, CHEK2, PALB2, PTEN, STK11, TP53. Once this test is complete, analysis of additional genes on a larger hereditary cancer panel will proceed. She will be called after each result is obtained.   Dr. Jana [name] was available for questions concerning this case. Total time spent by me in face-to-face counseling was approximately 35 minutes.   _____________________________________________________________________   History of Present Illness: Ms. [name], a 66 y.o. female, is being seen at the [facility] due to a personal and family history of cancer. She presents to clinic today to discuss the possibility of a hereditary predisposition to cancer and discuss whether genetic testing is warranted.  [name] was recently diagnosed with right breast cancer at the  age of 14. She has a history of left breast cancer at age 66. She also reports a history of thyroid cancer.   [name] indicated that she will be using results of genetic testing to decide which surgery to have.  She reported a negative colonoscopy this year and has not had any polyps found in a previous colonoscopy.    Past Medical History:  Diagnosis Date  . Arthritis   . Breast cancer ([place]) 2004   right breast  . Bruises easily   . Cancer [facility]) 2004   rt breast;takes Arimidex daily;thyroid cancer  . History of bronchitis    when she was young  . Hypertension    takes Tribenzor daily  . Hypothyroidism    takes Synthroid daily  . Joint pain   . Joint swelling   . Personal history of chemotherapy 04/2003  . Personal history of radiation therapy 2005  . Pneumonia    hx of when she was young  . Sleep apnea    has cpap but doesn't use;sleep study done 3-76yr ago  . [place]  . Urinary frequency     Past Surgical History:  Procedure Laterality Date  . ANTERIOR CERVICAL DECOMP/DISCECTOMY FUSION  2008  . APPENDECTOMY  2007  . BREAST BIOPSY Right 02/28/2003   malignant  . BREAST LUMPECTOMY  2004  . CESAREAN SECTION  344yrago  . COLONOSCOPY    . COLONOSCOPY    . KNEE ARTHROSCOPY  2007  . KNEE ARTHROSCOPY  05/02/2011   Procedure: ARTHROSCOPY KNEE;  Surgeon: JeJohnn Hai [place] [facility] [place] Service:  Orthopedics;  Laterality: Right;  . MENISCUS DEBRIDEMENT  05/02/2011   Procedure: DEBRIDEMENT OF MENISCUS;  Surgeon: Johnn Hai;  Location: [place];  Service: Orthopedics;  Laterality: Right;  . porta cath remove  2005  . PORTACATH PLACEMENT  2004  . portacath removal    . THYROID LOBECTOMY Right 09/22/2012   Dr Constance Holster  . THYROIDECTOMY N/A 09/22/2012   Procedure: RIGHT THYROID LOBECTOMY WITH FROZEN SECTION;  Surgeon: Izora Gala, MD;  Location: [place];  Service: ENT;  Laterality: N/A;  . THYROIDECTOMY Left 10/21/2012     Procedure: COMPLETION OF THYROIDECTOMY;  Surgeon: Izora Gala, MD;  Location: [place];  Service: ENT;  Laterality: Left;  . TOTAL KNEE ARTHROPLASTY  04/14/2012   Procedure: TOTAL KNEE ARTHROPLASTY;  Surgeon: Ninetta Lights, MD;  Location: [place];  Service: Orthopedics;  Laterality: Right;  RIGHT ARTHROPLASTY KNEE MEDIAL/LATERAL COMPARTMENTS WITH PATELLA RESURFACING  . TOTAL KNEE ARTHROPLASTY Left 03/30/2013   Procedure: TOTAL KNEE ARTHROPLASTY;  Surgeon: Ninetta Lights, MD;  Location: [place];  Service: Orthopedics;  Laterality: Left;    Social History   Socioeconomic History  . Marital status: Single    Spouse name: Not on file  . Number of children: Not on file  . Years of education: Not on file  . Highest education level: Not on file  Social Needs  . Financial resource strain: Not on file  . Food insecurity - worry: Not on file  . Food insecurity - inability: Not on file  . Transportation needs - medical: Not on file  . Transportation needs - non-medical: Not on file  Occupational History  . Not on file  Tobacco Use  . Smoking status: Never Smoker  . Smokeless tobacco: Never Used  Substance and Sexual Activity  . Alcohol use: Yes    Alcohol/week: 0.0 oz    Comment: socially  . Drug use: No  . Sexual activity: No  Other Topics Concern  . Not on file  Social History Narrative  . Not on file     Family History:  During the visit, a 4-generation pedigree was obtained. Family tree will be scanned in the Media tab in Epic  Significant diagnoses include the following:  Family History  Problem Relation Age of Onset  . Breast cancer Mother 75       currently 73  . Liver cancer Father   . Breast cancer Other        pat grandfather's sister; dx 60s    Additionally, [name] has one son (age 22). She has one brother (age 68) and one sister (age 83). She had another sister who died at 35 due to CHF. Her mother has one sister in her 25s. Her father had 4 brothers; no  sisters.  [name] ancestry is African American. There is no known Jewish ancestry and no consanguinity.  Discussion: We reviewed the characteristics, features and inheritance patterns of hereditary cancer syndromes. We discussed her risk of harboring a mutation in the context of her personal and family history. We discussed that the paucity of women in her paternal family makes risk assessment challenging. We discussed the process of genetic testing, insurance coverage and implications of results: positive, negative and variant of unknown significance (VUS).    Ms. Salmons' questions were answered to her satisfaction today and she is welcome to call with any additional questions or concerns. Thank you for the referral and allowing [place] to share in the care of your  patient.    Steele Berg, MS, [place] Certified Genetic Counselor phone: [phone] [email or web address]   ______________________________________________________________________ For Office Staff:  Number of people involved in session: 1 Was an Intern/ student involved with case: no

## 2017-04-17 NOTE — Telephone Encounter (Signed)
I left message for patient regarding appointment with D/T/Loc/Phone #

## 2017-04-22 ENCOUNTER — Telehealth: Payer: Self-pay | Admitting: Hematology and Oncology

## 2017-04-22 NOTE — Telephone Encounter (Signed)
Left message for patient regarding appointment being rescheduled from Dr Jana Hakim on 1/7  to Dr Lindi Adie on 1/3 per Bary Castilla, RN

## 2017-04-27 ENCOUNTER — Ambulatory Visit: Payer: Self-pay | Admitting: Genetic Counselor

## 2017-04-27 NOTE — Progress Notes (Signed)
[facility]            Result Disclosure        Patient Name: [name] Patient DOB: 1950/07/04 Patient Age: 66 y.o. Encounter Date: 04/27/2017  Referring Provider: Autumn Messing, MD  Reason for Call: Discuss results of genetic testing- 1st of 2 results   This is a brief note to document preliminary genetic test results.  [name] was called today to discuss the first of her genetic test results. Please see the Genetics note from her visit on 04/17/2017. Due to time contraints and needing actionable results for medical management, [facility]'s STAT Breast panel was ordered first.  Preliminary Test Results: Genetic testing involved analysis of 9 genes: ATM, BRCA1, BRCA2, CDH1, CHEK2, PALB2, PTEN, STK11 and TP53 genes. Testing was normal and did not reveal a mutation in these genes. Testing is in process for the remaining genes on the Multi-Cancer panel.  Once results are obtained, Ms. Conrow will be called again. She does not need to wait to proceed with decisions regarding treatment.   Steele Berg, MS, [place] Certified Genetic Counselor phone: [phone]

## 2017-04-30 ENCOUNTER — Ambulatory Visit (HOSPITAL_BASED_OUTPATIENT_CLINIC_OR_DEPARTMENT_OTHER): Payer: 59 | Admitting: Hematology and Oncology

## 2017-04-30 ENCOUNTER — Encounter: Payer: Self-pay | Admitting: *Deleted

## 2017-04-30 DIAGNOSIS — D0512 Intraductal carcinoma in situ of left breast: Secondary | ICD-10-CM | POA: Insufficient documentation

## 2017-04-30 DIAGNOSIS — Z170 Estrogen receptor positive status [ER+]: Secondary | ICD-10-CM

## 2017-04-30 MED ORDER — TAMOXIFEN CITRATE 20 MG PO TABS: 20.0000 mg | ORAL_TABLET | Freq: Every day | ORAL | 3 refills | Status: DC

## 2017-04-30 NOTE — Progress Notes (Signed)
[place] CONSULT NOTE  Patient Care Team: Lucianne Lei, MD as PCP - General (Family Medicine)  CHIEF COMPLAINTS/PURPOSE OF CONSULTATION:  Newly diagnosed left breast DCIS  HISTORY OF PRESENTING ILLNESS:  [name] 67 y.o. female is here because of recent diagnosis of left breast DCIS.  Patient had a prior history of right breast cancer that was detected 15 years ago was treated with lumpectomy followed by adjuvant chemotherapy and radiation as well as tamoxifen for 5 years.  She came for a routine screening mammogram. Patient had a screening mammogram that detected calcifications.  This was further evaluated by diagnostic mammograms that revealed multiple grouped fine pleomorphic and coarse heterogeneous calcifications in the left breast upper outer quadrant measuring 5 cm in size.  Because of her suspicious a stereotactic biopsy was performed.  Biopsy revealed DCIS with necrosis and calcifications that was ER PR positive.  She was seen by surgery and was sent to [place] for discussion regarding adjuvant treatment options.  I reviewed her records extensively and collaborated the history with the patient.  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      04/09/2017 Initial Diagnosis    Multiple grouped pleomorphic and coarse heterogeneous calcifications spanning 5 cm. Two biopsies revealed DCIS with necrosis ER 100%, PR 100% on the first biopsy, ER 95%, PR 90% on second biopsy, Tis Nx stage 0      MEDICAL HISTORY:  Past Medical History:  Diagnosis Date  . Arthritis   . Breast cancer [facility]) 2004   right breast  . Breast cancer, left ([place])   . Bruises easily   . Cancer [facility]) 2004   rt breast;takes Arimidex daily;thyroid cancer  .  History of bronchitis    when she was young  . Hypertension    takes Tribenzor daily  . Hypothyroidism    takes Synthroid daily  . Joint pain   . Joint swelling   . Personal history of chemotherapy 04/2003  . Personal history of radiation therapy 2005  . Pneumonia    hx of when she was young  . Sleep apnea    has cpap but doesn't use;sleep study done 3-104yrs ago  . Thyroid cancer ([place]) 2012  . Urinary frequency     SURGICAL HISTORY: Past Surgical History:  Procedure Laterality Date  . ANTERIOR CERVICAL DECOMP/DISCECTOMY FUSION  2008  . APPENDECTOMY  2007  . BREAST BIOPSY Right 02/28/2003   malignant  . BREAST LUMPECTOMY  2004  . CESAREAN SECTION  70yrs ago  . COLONOSCOPY    . COLONOSCOPY    . KNEE ARTHROSCOPY  2007  . KNEE ARTHROSCOPY  05/02/2011   Procedure: ARTHROSCOPY KNEE;  Surgeon: Johnn Hai;  Location: [place];  Service: Orthopedics;  Laterality: Right;  . MENISCUS DEBRIDEMENT  05/02/2011   Procedure: DEBRIDEMENT OF MENISCUS;  Surgeon: Johnn Hai;  Location: [place];  Service: Orthopedics;  Laterality: Right;  . porta cath remove  2005  . PORTACATH PLACEMENT  2004  . portacath removal    . THYROID LOBECTOMY Right 09/22/2012   Dr Constance Holster  . THYROIDECTOMY N/A 09/22/2012   Procedure: RIGHT THYROID LOBECTOMY WITH FROZEN SECTION;  Surgeon: Izora Gala, MD;  Location: [place];  Service: ENT;  Laterality: N/A;  . THYROIDECTOMY Left 10/21/2012   Procedure: COMPLETION OF THYROIDECTOMY;  Surgeon: Izora Gala, MD;  Location: [place];  Service: ENT;  Laterality: Left;  . TOTAL KNEE ARTHROPLASTY  04/14/2012   Procedure: TOTAL KNEE ARTHROPLASTY;  Surgeon: Ninetta Lights, MD;  Location: [place];  Service: Orthopedics;  Laterality: Right;  RIGHT ARTHROPLASTY KNEE MEDIAL/LATERAL COMPARTMENTS WITH PATELLA RESURFACING  . TOTAL KNEE ARTHROPLASTY Left 03/30/2013   Procedure: TOTAL KNEE ARTHROPLASTY;  Surgeon: Ninetta Lights, MD;  Location: [place];   Service: Orthopedics;  Laterality: Left;    SOCIAL HISTORY: Social History   Socioeconomic History  . Marital status: Single    Spouse name: Not on file  . Number of children: Not on file  . Years of education: Not on file  . Highest education level: Not on file  Social Needs  . Financial resource strain: Not on file  . Food insecurity - worry: Not on file  . Food insecurity - inability: Not on file  . Transportation needs - medical: Not on file  . Transportation needs - non-medical: Not on file  Occupational History  . Not on file  Tobacco Use  . Smoking status: Never Smoker  . Smokeless tobacco: Never Used  Substance and Sexual Activity  . Alcohol use: Yes    Alcohol/week: 0.0 oz    Comment: socially  . Drug use: No  . Sexual activity: No  Other Topics Concern  . Not on file  Social History Narrative  . Not on file    FAMILY HISTORY: Family History  Problem Relation Age of Onset  . Breast cancer Mother 30       currently 7  . Liver cancer Father   . Breast cancer Other        pat grandfather's sister; dx 30s    ALLERGIES:  is allergic to penicillins.  MEDICATIONS:  Current Outpatient Medications  Medication Sig Dispense Refill  . levothyroxine (SYNTHROID, LEVOTHROID) 88 MCG tablet Take 88 mcg by mouth daily before breakfast.    . Olmesartan-Amlodipine-HCTZ 20-5-12.5 MG TABS Take by mouth daily.    . tamoxifen (NOLVADEX) 20 MG tablet Take 1 tablet (20 mg total) by mouth daily. 90 tablet 3   Current Facility-Administered Medications  Medication Dose Route Frequency Provider Last Rate Last Dose  . 0.9 %  sodium chloride infusion  500 mL Intravenous Continuous Armbruster, Carlota Raspberry, MD        REVIEW OF SYSTEMS:   Constitutional: Denies fevers, chills or abnormal night sweats Eyes: Denies blurriness of vision, double vision or watery eyes Ears, nose, mouth, throat, and face: Denies mucositis or sore throat Respiratory: Denies cough, dyspnea or  wheezes Cardiovascular: Denies palpitation, chest discomfort or lower extremity swelling Gastrointestinal:  Denies nausea, heartburn or change in bowel habits Skin: Denies abnormal skin rashes Lymphatics: Denies new lymphadenopathy or easy bruising Neurological:Denies numbness, tingling or new weaknesses Behavioral/Psych: Mood is stable, no new changes  Breast:  Denies any palpable lumps or discharge All other systems were reviewed with the patient and are negative.  PHYSICAL EXAMINATION: ECOG PERFORMANCE STATUS: 1 - Symptomatic but completely ambulatory  Vitals:   04/30/17 1515  BP: (!) 134/95  Pulse: 95  Resp: 17  Temp: 98.2 F (36.8 C)  SpO2: 99%   Filed Weights   04/30/17 1515  Weight: 220 lb 4.8 oz (99.9 kg)    GENERAL:alert, no distress and comfortable SKIN: skin color, texture, turgor are normal, no rashes or significant lesions EYES:  normal, conjunctiva are pink and non-injected, sclera clear OROPHARYNX:no exudate, no erythema and lips, buccal mucosa, and tongue normal  NECK: supple, thyroid normal size, non-tender, without nodularity LYMPH:  no palpable lymphadenopathy in the cervical, axillary or inguinal LUNGS: clear to auscultation and percussion with normal breathing effort HEART: regular rate & rhythm and no murmurs and no lower extremity edema ABDOMEN:abdomen soft, non-tender and normal bowel sounds Musculoskeletal:no cyanosis of digits and no clubbing  PSYCH: alert & oriented x 3 with fluent speech NEURO: no focal motor/sensory deficits BREAST: No palpable nodules in breast. No palpable axillary or supraclavicular lymphadenopathy (exam performed in the presence of a chaperone)   LABORATORY DATA:  I have reviewed the data as listed Lab Results  Component Value Date   WBC 6.4 06/27/2013   HGB 12.1 06/27/2013   HCT 37.6 06/27/2013   MCV 86.5 06/27/2013   PLT 287 06/27/2013   Lab Results  Component Value Date   NA 144 06/27/2013   K 3.5 06/27/2013    CL 106 04/02/2013   CO2 30 (H) 06/27/2013    RADIOGRAPHIC STUDIES: I have personally reviewed the radiological reports and agreed with the findings in the report.  ASSESSMENT AND PLAN:  Ductal carcinoma in situ (DCIS) of left breast 04/09/2017: Multiple grouped pleomorphic and coarse heterogeneous calcifications spanning 5 cm. Two biopsies revealed DCIS with necrosis ER 100%, PR 100% on the first biopsy, ER 95%, PR 90% on second biopsy, Tis Nx stage 0  Pathology review: I discussed with the patient the difference between DCIS and invasive breast cancer. It is considered a precancerous lesion. DCIS is classified as a 0. It is generally detected through mammograms as calcifications. We discussed the significance of grades and its impact on prognosis. We also discussed the importance of ER and PR receptors and their implications to adjuvant treatment options. Prognosis of DCIS dependence on grade, comedo necrosis. It is anticipated that if not treated, 20-30% of DCIS can develop into invasive breast cancer.  Recommendation: 1.  Because of the large size, she may need a mastectomy.  She is contemplating on mastectomy with reconstruction versus bilateral mastectomies with reconstruction. 2. Followed by adjuvant radiation therapy only if she undergoes breast conserving surgery 3. Followed by antiestrogen therapy with tamoxifen 5 years, only if she does single-sided mastectomy.  Tamoxifen counseling: We discussed the risks and benefits of tamoxifen. These include but not limited to insomnia, hot flashes, mood changes, vaginal dryness, and weight gain. Although rare, serious side effects including endometrial cancer, risk of blood clots were also discussed. We strongly believe that the benefits far outweigh the risks. Patient understands these risks and consented to starting treatment. Planned treatment duration is 5 years.  Return to clinic after surgery to discuss the final pathology report and come  up with an adjuvant treatment plan.     All questions were answered. The patient knows to call the clinic with any problems, questions or concerns.    Harriette Ohara, MD 04/30/17

## 2017-04-30 NOTE — Assessment & Plan Note (Signed)
04/09/2017: Multiple grouped pleomorphic and coarse heterogeneous calcifications spanning 5 cm. Two biopsies revealed DCIS with necrosis ER 100%, PR 100% on the first biopsy, ER 95%, PR 90% on second biopsy, Tis Nx stage 0  Pathology review: I discussed with the patient the difference between DCIS and invasive breast cancer. It is considered a precancerous lesion. DCIS is classified as a 0. It is generally detected through mammograms as calcifications. We discussed the significance of grades and its impact on prognosis. We also discussed the importance of ER and PR receptors and their implications to adjuvant treatment options. Prognosis of DCIS dependence on grade, comedo necrosis. It is anticipated that if not treated, 20-30% of DCIS can develop into invasive breast cancer.  Recommendation: 1. Breast conserving surgery 2. Followed by adjuvant radiation therapy 3. Followed by antiestrogen therapy with tamoxifen 5 years  Tamoxifen counseling: We discussed the risks and benefits of tamoxifen. These include but not limited to insomnia, hot flashes, mood changes, vaginal dryness, and weight gain. Although rare, serious side effects including endometrial cancer, risk of blood clots were also discussed. We strongly believe that the benefits far outweigh the risks. Patient understands these risks and consented to starting treatment. Planned treatment duration is 5 years.  Return to clinic after surgery to discuss the final pathology report and come up with an adjuvant treatment plan.

## 2017-05-01 ENCOUNTER — Encounter: Payer: Self-pay | Admitting: Genetic Counselor

## 2017-05-01 ENCOUNTER — Ambulatory Visit: Payer: Self-pay | Admitting: Genetic Counselor

## 2017-05-01 DIAGNOSIS — Z1379 Encounter for other screening for genetic and chromosomal anomalies: Secondary | ICD-10-CM

## 2017-05-01 NOTE — Progress Notes (Signed)
[facility]       Genetic Test Results    Patient Name: [name] Patient DOB: 04/24/1951 Patient Age: 67 y.o. Encounter Date: 05/01/2017  Referring Provider: Autumn Messing, MD  Primary Care Provider: Lucianne Lei, MD   [name] was called today to discuss genetic test results. Please see the Genetics note from her visit on 04/17/2017 for a detailed discussion of her personal and family history.  Genetic Testing: At the time of [name]' visit, she decided to pursue genetic testing of 9 genes that may be used to help guide treatment decisions. Once that test was completed, additional genes on a larger panel were analyzed. Testing which included sequencing and deletion/duplication analysis. Testing did not reveal any pathogenic mutation in any of these genes. A copy of the genetic test report will be scanned into Epic under the media tab.  The genes analyzed were the 83 genes on [facility]'s Multi-Cancer panel (ALK, APC, ATM, AXIN2, BAP1, BARD1, BLM, BMPR1A, BRCA1, BRCA2, BRIP1, CASR, CDC73, CDH1, CDK4, CDKN1B, CDKN1C, CDKN2A, CEBPA, CHEK2, CTNNA1, DICER1, DIS3L2, EGFR, EPCAM, FH, FLCN, GATA2, GPC3, GREM1, HOXB13, HRAS, KIT, MAX, MEN1, MET, MITF, MLH1, MSH2, MSH3, MSH6, MUTYH, NBN, NF1, NF2, NTHL1, PALB2, PDGFRA, PHOX2B, PMS2, POLD1, POLE, POT1, PRKAR1A, PTCH1, PTEN, RAD50, RAD51C, RAD51D, RB1, RECQL4, RET, RUNX1, SDHA, SDHAF2, SDHB, SDHC, SDHD, SMAD4, SMARCA4, SMARCB1, SMARCE1, STK11, SUFU, TERC, TERT, TMEM127, TP53, TSC1, TSC2, VHL, WRN, WT1).  Since the current test is not perfect, it is possible that there may be a gene mutation that current testing cannot detect, but that chance is small. It is possible that a different genetic factor, which has not yet been discovered or is not on this panel, is responsible for the cancer diagnoses in the family. Again, the likelihood of this is low. No additional testing is recommended at this time for [name].  A  Variant of Uncertain Significance was detected: MUTYH, c.925C>T (p.[id number]) This is still considered a normal result. While at this time, it is unknown if this finding is associated with increased cancer risk, the majority of these variants get reclassified to be inconsequential. We emphasized that medical management should not be based on this finding. With time, we suspect the lab will determine the significance, if any. If we do learn more about it, we will try to contact [name] to discuss it further. It is important to stay in touch with [place] periodically and keep the address and phone number up to date.  Cancer Screening: These results suggest that [name]' cancers were most likely not due to an inherited predisposition. Most cancers happen by chance and this test, along with details of her family history, suggests that her cancers fall into this category. We discussed continuing to follow the cancer screening guidelines provided by her physicians.   Family Members: Family members are at some increased risk of developing cancer, over the general population risk, simply due to the family history. Women are recommended to have a yearly mammogram beginning at age 70, a yearly clinical breast exam, a yearly gynecologic exam and perform monthly breast self-exams. Colon cancer screening is recommended to begin by age 89 in both men and women, unless there is a family history of colon cancer or colon polyps or an individual has a personal history to warrant initiating screening at a younger age.  Any relative who had cancer at a young  age or had a particularly rare cancer may also wish to pursue genetic testing. Genetic counselors can be located in other cities, by visiting the website of the [employer] ([employer]) and Field seismologist for a Dietitian by zip code.   Family members are not recommended to get tested for the above VUS outside of a research protocol as this  finding has no implications for their medical management.    Lastly, cancer genetics is a rapidly advancing field and it is possible that new genetic tests will be appropriate for [name] in the future. We encourage her to remain in contact with [place] on an annual basis so we can update her personal and family histories, and let her know of advances in cancer genetics that may benefit the family. Our contact number was provided. [name] is welcome to call anytime with additional questions.     Yvonne Berg, MS, [place] Certified Genetic Counselor phone: [phone]

## 2017-05-04 ENCOUNTER — Ambulatory Visit: Payer: 59 | Admitting: Oncology

## 2017-05-05 DIAGNOSIS — Z923 Personal history of irradiation: Secondary | ICD-10-CM | POA: Diagnosis not present

## 2017-05-05 DIAGNOSIS — Z853 Personal history of malignant neoplasm of breast: Secondary | ICD-10-CM | POA: Diagnosis not present

## 2017-05-05 DIAGNOSIS — D0512 Intraductal carcinoma in situ of left breast: Secondary | ICD-10-CM | POA: Diagnosis not present

## 2017-05-05 DIAGNOSIS — Z170 Estrogen receptor positive status [ER+]: Secondary | ICD-10-CM | POA: Diagnosis not present

## 2017-05-11 DIAGNOSIS — I10 Essential (primary) hypertension: Secondary | ICD-10-CM | POA: Diagnosis not present

## 2017-05-11 DIAGNOSIS — R7301 Impaired fasting glucose: Secondary | ICD-10-CM | POA: Diagnosis not present

## 2017-05-11 DIAGNOSIS — J399 Disease of upper respiratory tract, unspecified: Secondary | ICD-10-CM | POA: Diagnosis not present

## 2017-05-12 ENCOUNTER — Ambulatory Visit: Payer: Self-pay | Admitting: General Surgery

## 2017-05-15 ENCOUNTER — Telehealth: Payer: Self-pay | Admitting: Hematology and Oncology

## 2017-05-15 NOTE — Telephone Encounter (Signed)
05/15/17 @ 10:30 am spoke with patient informing her that her FMLA paperwork was successfully faxed @ 6:58 am to [place] @ [phone].  She requested a copy be sent to her for personal records ([place], [place] [id number])

## 2017-05-19 DIAGNOSIS — Z01419 Encounter for gynecological examination (general) (routine) without abnormal findings: Secondary | ICD-10-CM | POA: Diagnosis not present

## 2017-05-19 DIAGNOSIS — Z853 Personal history of malignant neoplasm of breast: Secondary | ICD-10-CM | POA: Diagnosis not present

## 2017-05-19 DIAGNOSIS — Z170 Estrogen receptor positive status [ER+]: Secondary | ICD-10-CM | POA: Diagnosis not present

## 2017-05-19 DIAGNOSIS — E034 Atrophy of thyroid (acquired): Secondary | ICD-10-CM | POA: Diagnosis not present

## 2017-05-19 DIAGNOSIS — J399 Disease of upper respiratory tract, unspecified: Secondary | ICD-10-CM | POA: Diagnosis not present

## 2017-05-19 DIAGNOSIS — I10 Essential (primary) hypertension: Secondary | ICD-10-CM | POA: Diagnosis not present

## 2017-05-19 DIAGNOSIS — Z124 Encounter for screening for malignant neoplasm of cervix: Secondary | ICD-10-CM | POA: Diagnosis not present

## 2017-05-19 DIAGNOSIS — Z923 Personal history of irradiation: Secondary | ICD-10-CM | POA: Diagnosis not present

## 2017-05-19 DIAGNOSIS — R7309 Other abnormal glucose: Secondary | ICD-10-CM | POA: Diagnosis not present

## 2017-05-19 DIAGNOSIS — D0512 Intraductal carcinoma in situ of left breast: Secondary | ICD-10-CM | POA: Diagnosis not present

## 2017-05-21 ENCOUNTER — Ambulatory Visit: Payer: Self-pay | Admitting: General Surgery

## 2017-05-21 DIAGNOSIS — D0512 Intraductal carcinoma in situ of left breast: Secondary | ICD-10-CM

## 2017-05-22 ENCOUNTER — Telehealth: Payer: Self-pay | Admitting: Hematology and Oncology

## 2017-05-22 NOTE — Telephone Encounter (Signed)
Spoke to patient regarding upcoming March appointments per 1/25 sch message

## 2017-05-25 DIAGNOSIS — M47814 Spondylosis without myelopathy or radiculopathy, thoracic region: Secondary | ICD-10-CM | POA: Diagnosis not present

## 2017-05-25 DIAGNOSIS — I10 Essential (primary) hypertension: Secondary | ICD-10-CM | POA: Diagnosis not present

## 2017-05-25 DIAGNOSIS — M5134 Other intervertebral disc degeneration, thoracic region: Secondary | ICD-10-CM | POA: Diagnosis not present

## 2017-05-25 DIAGNOSIS — Z6837 Body mass index (BMI) 37.0-37.9, adult: Secondary | ICD-10-CM | POA: Diagnosis not present

## 2017-05-25 DIAGNOSIS — M47816 Spondylosis without myelopathy or radiculopathy, lumbar region: Secondary | ICD-10-CM | POA: Diagnosis not present

## 2017-05-25 DIAGNOSIS — M4155 Other secondary scoliosis, thoracolumbar region: Secondary | ICD-10-CM | POA: Diagnosis not present

## 2017-05-25 DIAGNOSIS — M5136 Other intervertebral disc degeneration, lumbar region: Secondary | ICD-10-CM | POA: Diagnosis not present

## 2017-05-25 DIAGNOSIS — M546 Pain in thoracic spine: Secondary | ICD-10-CM | POA: Diagnosis not present

## 2017-05-25 DIAGNOSIS — M545 Low back pain: Secondary | ICD-10-CM | POA: Diagnosis not present

## 2017-05-29 DIAGNOSIS — C73 Malignant neoplasm of thyroid gland: Secondary | ICD-10-CM | POA: Diagnosis not present

## 2017-05-29 DIAGNOSIS — E890 Postprocedural hypothyroidism: Secondary | ICD-10-CM | POA: Diagnosis not present

## 2017-06-09 ENCOUNTER — Other Ambulatory Visit: Payer: Self-pay

## 2017-06-09 ENCOUNTER — Ambulatory Visit: Payer: Medicare Other | Attending: Neurosurgery | Admitting: Physical Therapy

## 2017-06-09 ENCOUNTER — Encounter: Payer: Self-pay | Admitting: Physical Therapy

## 2017-06-09 DIAGNOSIS — G8929 Other chronic pain: Secondary | ICD-10-CM | POA: Diagnosis not present

## 2017-06-09 DIAGNOSIS — M6281 Muscle weakness (generalized): Secondary | ICD-10-CM

## 2017-06-09 DIAGNOSIS — M545 Low back pain, unspecified: Secondary | ICD-10-CM

## 2017-06-09 NOTE — Therapy (Signed)
[facility] [address], [place], [place], [address] Phone: [phone]   Fax:  [phone]  Physical Therapy Evaluation  Patient Details  Name: [name] MRN: [id number] Date of Birth: Sep 07, 1950 Referring Provider: Dr. Sherwood Gambler   Encounter Date: 06/09/2017  PT End of Session - 06/09/17 0823    Visit Number  1    Date for PT Re-Evaluation  06/30/17    Authorization Type  Medicare;  KX at visit 15    PT Start Time  5027    PT Stop Time  0840    PT Time Calculation (min)  45 min    Activity Tolerance  Patient tolerated treatment well       Past Medical History:  Diagnosis Date  . Arthritis   . Breast cancer [facility]) 2004   right breast  . Breast cancer, left ([place])   . Bruises easily   . Cancer [facility]) 2004   rt breast;takes Arimidex daily;thyroid cancer  . Genetic testing 04/17/2017   STAT Breast panel with reflex to Multi-Cancer panel (83 genes) @ [facility] - No pathogenic mutations detected  . History of bronchitis    when she was young  . Hypertension    takes Tribenzor daily  . Hypothyroidism    takes Synthroid daily  . Joint pain   . Joint swelling   . Personal history of chemotherapy 04/2003  . Personal history of radiation therapy 2005  . Pneumonia    hx of when she was young  . Sleep apnea    has cpap but doesn't use;sleep study done 3-61yrs ago  . Thyroid cancer ([place]) 2012  . Urinary frequency     Past Surgical History:  Procedure Laterality Date  . ANTERIOR CERVICAL DECOMP/DISCECTOMY FUSION  2008  . APPENDECTOMY  2007  . BREAST BIOPSY Right 02/28/2003   malignant  . BREAST LUMPECTOMY  2004  . CESAREAN SECTION  73yrs ago  . COLONOSCOPY    . COLONOSCOPY    . KNEE ARTHROSCOPY  2007  . KNEE ARTHROSCOPY  05/02/2011   Procedure: ARTHROSCOPY KNEE;  Surgeon: Johnn Hai;  Location: [place];  Service: Orthopedics;  Laterality: Right;  . MENISCUS DEBRIDEMENT  05/02/2011    Procedure: DEBRIDEMENT OF MENISCUS;  Surgeon: Johnn Hai;  Location: [place];  Service: Orthopedics;  Laterality: Right;  . porta cath remove  2005  . PORTACATH PLACEMENT  2004  . portacath removal    . THYROID LOBECTOMY Right 09/22/2012   Dr Constance Holster  . THYROIDECTOMY N/A 09/22/2012   Procedure: RIGHT THYROID LOBECTOMY WITH FROZEN SECTION;  Surgeon: Izora Gala, MD;  Location: [place];  Service: ENT;  Laterality: N/A;  . THYROIDECTOMY Left 10/21/2012   Procedure: COMPLETION OF THYROIDECTOMY;  Surgeon: Izora Gala, MD;  Location: [place];  Service: ENT;  Laterality: Left;  . TOTAL KNEE ARTHROPLASTY  04/14/2012   Procedure: TOTAL KNEE ARTHROPLASTY;  Surgeon: Ninetta Lights, MD;  Location: [place];  Service: Orthopedics;  Laterality: Right;  RIGHT ARTHROPLASTY KNEE MEDIAL/LATERAL COMPARTMENTS WITH PATELLA RESURFACING  . TOTAL KNEE ARTHROPLASTY Left 03/30/2013   Procedure: TOTAL KNEE ARTHROPLASTY;  Surgeon: Ninetta Lights, MD;  Location: [place];  Service: Orthopedics;  Laterality: Left;    There were no vitals filed for this visit.   Subjective Assessment - 06/09/17 0802    Subjective  Lately left low back pain starting in November.  Using improved standing work station which helps but a  spasm like feeling is random.  Especially with turning a certain way.  Just got back from a cruise.     Pertinent History  bil mastectomy 06/22/17 upcoming    Limitations  House hold activities;Walking;Sitting    How long can you sit comfortably?  1 hour    How long can you walk comfortably?  this has improved 15-20 min    Diagnostic tests  none recently    Patient Stated Goals  get back pain relief before surgery     Currently in Pain?  Yes    Pain Score  3     Pain Location  Back    Pain Orientation  Left    Pain Type  Chronic pain    Pain Onset  More than a month ago    Pain Frequency  Constant    Aggravating Factors   move the wrong way;  mid day noon ish and 3:00;  every 2 hours     Pain Relieving Factors  change your position;  DN; heat; ES         [facility] PT Assessment - 06/09/17 0001      Assessment   Medical Diagnosis  low back pain     Referring Provider  Dr. Sherwood Gambler    Onset Date/Surgical Date  -- Nov    Hand Dominance  Right    Next MD Visit  as needed    Prior Therapy  in the Fall      Precautions   Precautions  None      Restrictions   Weight Bearing Restrictions  No      Balance Screen   Has the patient fallen in the past 6 months  No    Has the patient had a decrease in activity level because of a fear of falling?   No    Is the patient reluctant to leave their home because of a fear of falling?   No      Home Social worker  Private residence    Living Arrangements  Alone    Available Help at Discharge  Family;Friend(s)    Home Layout  Two level      Prior Function   Level of Independence  Independent    Vocation  Full time employment    Leisure  travel       Observation/Other Assessments   Focus on Therapeutic Outcomes (FOTO)   57% limitation      Posture/Postural Control   Posture/Postural Control  Postural limitations      AROM   Lumbar Flexion  70    Lumbar Extension  10    Lumbar - Right Side Bend  20    Lumbar - Left Side Bend  15      Strength   Right/Left Hip  -- Hip extensors/abductors 4/5 bil    Lumbar Flexion  3/5    Lumbar Extension  4/5 decreased activation of transverse abdominus      Flexibility   Soft Tissue Assessment /Muscle Length  yes    Hamstrings  90 degrees bil      Palpation   Palpation comment  tender points left gluteals and left QL      Slump test   Findings  Negative      Straight Leg Raise   Findings  Negative             Objective measurements completed on examination: See above findings.  PT Education - 06/09/17 [id number]    Education provided  Yes    Education Details  basic body mechanics and exercise    Person(s) Educated  Patient     Methods  Explanation;Demonstration;Handout    Comprehension  Verbalized understanding;Returned demonstration       PT Short Term Goals - 06/09/17 1127      PT SHORT TERM GOAL #1   Title  STGs=LTGs      PT SHORT TERM GOAL #2   Title  ...      PT SHORT TERM GOAL #3   Title  ...      PT SHORT TERM GOAL #4   Title  ...        PT Long Term Goals - 06/09/17 1128      PT LONG TERM GOAL #1   Title  The patient will be independent in safe self progression of HEP, basic body mechanics and posture correction needed for further improvements in ROM and strength, function and decreased pain    Time  3    Period  Weeks    Status  New    Target Date  06/30/17      PT LONG TERM GOAL #2   Title  The patient will have improved lumbar flexion to 75 degrees, extension to 15 and bil sidebending to 25 degrees needed for improved mobility with ADLs    Time  3    Period  Weeks    Status  New      PT LONG TERM GOAL #3   Title  The patient will be able to activate transverse abdominus needed for rolling in bed and transferring from sit to stand with greater ease    Time  3    Period  Weeks    Status  New      PT LONG TERM GOAL #4   Title  The patient will report a 25% improvement in back pain with ADLs    Time  3    Period  Weeks    Status  New      PT LONG TERM GOAL #5   Title  FOTO functional outcome improved to 47% indicating improved function with less pain     Time  3    Period  Weeks    Status  New             Plan - 06/09/17 1115    Clinical Impression Statement  The patient has a chronic history of low back pain which has worsened especially in the left low back since November.  She reports intermittent spasms with turning the wrong way or in the afternoons when sitting at her work desk.  She reports is undergoing a preventative bilateral mastectomy with reconstruction on 06/22/17 and she would like to get her back pain under control to help her in the recovery from  upcoming surgery.  She reports a good response from previous PT with dry needling, manual therapy, electrical stimulation and heat.  She states she has not been compliant with her previous HEP secondary to traveling and numerous recent medical appts.  Decreased lumbar mobility and lumbo/pelvic/hip core strength.  She would benefit from PT to address pain, ROM and strength deficits prior to upcoming surgery.      History and Personal Factors relevant to plan of care:  Pre-Cancer left breast with scheduled bilateral mastectomy;  hx of right breast CA; HTN;  TKR    Clinical Presentation  Stable  Clinical Decision Making  Low    Rehab Potential  Good    Clinical Impairments Affecting Rehab Potential  scheduled surgery 06/22/17    PT Frequency  2x / week    PT Duration  3 weeks    PT Treatment/Interventions  ADLs/Self Care Home Management;Cryotherapy;Electrical Stimulation;Moist Heat;Traction;Therapeutic activities;Therapeutic exercise;Patient/family education;Neuromuscular re-education;Dry needling;Taping;Manual techniques    PT Next Visit Plan  Will most likely see patient for 3 visits before scheduled surgery;  DN to lumbar multifidi, left gluteals;  left hip mobilizations, re-establish HEP;  work on log rolling to help with upcoming surgery       Patient will benefit from skilled therapeutic intervention in order to improve the following deficits and impairments:  Pain, Decreased strength, Decreased range of motion, Increased muscle spasms  Visit Diagnosis: Chronic left-sided low back pain without sciatica - Plan: PT plan of care cert/re-cert  Muscle weakness (generalized) - Plan: PT plan of care cert/re-cert     Problem List Patient Active Problem List   Diagnosis Date Noted  . 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 Genetic testing 04/17/2017  . DJD (degenerative joint disease) of knee 03/30/2013  . Unspecified sleep apnea 03/23/2013  . 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   . Weakness 11/28/2011  . Hypertension 11/28/2011  . Hypokalemia 11/28/2011  . LUE weakness 11/28/2011  . Hypothyroidism 11/28/2011  . Thyroid cancer ([place]) 04/28/2010    Ruben Im, PT 06/09/17 11:34 AM Phone: [phone] Fax: [phone]  Alvera Singh 06/09/2017, 11:34 AM  [facility] [address] [place], [place], [address] Phone: [phone]   Fax:  [phone]  Name: LORETTE PETERKIN MRN: [id number] Date of Birth: 06-06-1950

## 2017-06-09 NOTE — Patient Instructions (Addendum)
        Lifting Principles  .Maintain proper posture and head alignment. .Slide object as close as possible before lifting. .Move obstacles out of the way. .Test before lifting; ask for help if too heavy. .Tighten stomach muscles without holding breath. .Use smooth movements; do not jerk. .Use legs to do the work, and pivot with feet. .Distribute the work load symmetrically and close to the center of trunk. .Push instead of pull whenever possible.   Squat down and hold basket close to stand. Use leg muscles to do the work.    Avoid twisting or bending back. Pivot around using foot movements, and bend at knees if needed when reaching for articles.        Getting Into / Out of Bed   Lower self to lie down on one side by raising legs and lowering head at the same time. Use arms to assist moving without twisting. Bend both knees to roll onto back if desired. To sit up, start from lying on side, and use same move-ments in reverse. Keep trunk aligned with legs.    Shift weight from front foot to back foot as item is lifted off shelf.    When leaning forward to pick object up from floor, extend one leg out behind. Keep back straight. Hold onto a sturdy support with other hand.      Sit upright, head facing forward. Try using a roll to support lower back. Keep shoulders relaxed, and avoid rounded back. Keep hips level with knees. Avoid crossing legs for long periods.      Copyright  VHI. All rights reserved.   Pelvic Tilt   Flatten back by tightening stomach muscles and buttocks. Repeat ___5_ times per set. Do __1__ sets per session. Do __1__ sessions per day.  http://orth.exer.us/134   Copyright  VHI. All rights reserved. Knee to Chest (Flexion)   Pull knee toward chest. Feel stretch in lower back or buttock area. Breathing deeply, Hold __20__ seconds. Repeat with other knee. Repeat _3___ times. Do __1__ sessions per day.  http://gt2.exer.us/225    Copyright  VHI. All rights reserved.   Lower Trunk Rotation Stretch   Keeping back flat and feet together, rotate knees to left side. Hold __20__ seconds. Repeat __3__ times per set. Do ___1_ sets per session. Do ___1_ sessions per day.  http://orth.exer.us/122          Ruben Im PT [facility] [address], [place], [place] [address] Phone # [phone] Fax [phone]

## 2017-06-11 ENCOUNTER — Encounter: Payer: Self-pay | Admitting: Physical Therapy

## 2017-06-11 ENCOUNTER — Ambulatory Visit: Payer: Medicare Other | Admitting: Physical Therapy

## 2017-06-11 DIAGNOSIS — G8929 Other chronic pain: Secondary | ICD-10-CM | POA: Diagnosis not present

## 2017-06-11 DIAGNOSIS — M545 Low back pain, unspecified: Secondary | ICD-10-CM

## 2017-06-11 DIAGNOSIS — M6281 Muscle weakness (generalized): Secondary | ICD-10-CM | POA: Diagnosis not present

## 2017-06-11 NOTE — Patient Instructions (Signed)
   RE-ALIGNMENT ROUTINE EXERCISES BASIC FOR POSTURAL CORRECTION   RE-ALIGNMENT Tips BENEFITS: 1.It helps to re-align the curves of the back and improve standing posture. 2.It allows the back muscles to rest and strengthen in preparation for more activity. FREQUENCY: Daily, even after weeks, months and years of more advanced exercises. START: 1.All exercises start in the same position: lying on the back, arms resting on the supporting surface, palms up and slightly away from the body, backs of hands down, knees bent, feet flat. 2.The head, neck, arms, and legs are supported according to specific instructions of your therapist. Copyright  VHI. All rights reserved.    1. Decompression Exercise: Basic.   Takes compression off the vertebral bodies; increases tolerance for lying on the back; helps relieve back pain   Lie on back on firm surface, knees bent, feet flat, arms turned up, out to sides (~35 degrees). Head neck and arms supported as necessary. Time _5-15__ minutes. Surface: floor     2. Shoulder Press  Strengthens upper back extensors and scapular retractors.   Press both shoulders down. Hold _2-3__ seconds. Repeat _3-5__ times. Surface: floor        3. Head Press With [place]  Strengthens neck extensors   Tuck chin SLIGHTLY toward chest, keep mouth closed. Feel weight on back of head. Increase weight by pressing head down. Hold _2-3__ seconds. Relax. Repeat 3-5___ times. Surface: floor   4. Leg Lengthener: "Grow your whole leg longer"  As if someone is pulling on your leg   5x  5.  Whole leg press down:  Like your lying on the beach and pressing your whole leg down into the sand 5x    [facility] [address] [place], [place] [address] Phone # [phone] Fax [phone]

## 2017-06-11 NOTE — Therapy (Signed)
[facility] [address] [place], [place], [address] Phone: [phone]   Fax:  [phone]  Physical Therapy Treatment  Patient Details  Name: [name] MRN: [id number] Date of Birth: [birth year] Referring Provider: Dr. Sherwood Gambler   Encounter Date: 06/11/2017  PT End of Session - 06/11/17 1652    Visit Number  2    Date for PT Re-Evaluation  06/30/17    Authorization Type  Medicare;  KX at visit 15    PT Start Time  0730    PT Stop Time  0815    PT Time Calculation (min)  45 min    Activity Tolerance  Patient tolerated treatment well       Past Medical History:  Diagnosis Date  . Arthritis   . Breast cancer [facility]) 2004   right breast  . Breast cancer, left ([place])   . Bruises easily   . Cancer [facility]) 2004   rt breast;takes Arimidex daily;thyroid cancer  . Genetic testing 04/17/2017   STAT Breast panel with reflex to Multi-Cancer panel (83 genes) @ [facility] - No pathogenic mutations detected  . History of bronchitis    when she was young  . Hypertension    takes Tribenzor daily  . Hypothyroidism    takes Synthroid daily  . Joint pain   . Joint swelling   . Personal history of chemotherapy 04/2003  . Personal history of radiation therapy 2005  . Pneumonia    hx of when she was young  . Sleep apnea    has cpap but doesn't use;sleep study done 3-14yrs ago  . Thyroid cancer ([place]) 2012  . Urinary frequency     Past Surgical History:  Procedure Laterality Date  . ANTERIOR CERVICAL DECOMP/DISCECTOMY FUSION  2008  . APPENDECTOMY  2007  . BREAST BIOPSY Right 02/28/2003   malignant  . BREAST LUMPECTOMY  2004  . CESAREAN SECTION  63yrs ago  . COLONOSCOPY    . COLONOSCOPY    . KNEE ARTHROSCOPY  2007  . KNEE ARTHROSCOPY  05/02/2011   Procedure: ARTHROSCOPY KNEE;  Surgeon: Johnn Hai;  Location: [place];  Service: Orthopedics;  Laterality: Right;  . MENISCUS DEBRIDEMENT  05/02/2011   Procedure: DEBRIDEMENT OF MENISCUS;  Surgeon: Johnn Hai;  Location: [place];  Service: Orthopedics;  Laterality: Right;  . porta cath remove  2005  . PORTACATH PLACEMENT  2004  . portacath removal    . THYROID LOBECTOMY Right 09/22/2012   Dr Constance Holster  . THYROIDECTOMY N/A 09/22/2012   Procedure: RIGHT THYROID LOBECTOMY WITH FROZEN SECTION;  Surgeon: Izora Gala, MD;  Location: [place];  Service: ENT;  Laterality: N/A;  . THYROIDECTOMY Left 10/21/2012   Procedure: COMPLETION OF THYROIDECTOMY;  Surgeon: Izora Gala, MD;  Location: [place];  Service: ENT;  Laterality: Left;  . TOTAL KNEE ARTHROPLASTY  04/14/2012   Procedure: TOTAL KNEE ARTHROPLASTY;  Surgeon: Ninetta Lights, MD;  Location: [place];  Service: Orthopedics;  Laterality: Right;  RIGHT ARTHROPLASTY KNEE MEDIAL/LATERAL COMPARTMENTS WITH PATELLA RESURFACING  . TOTAL KNEE ARTHROPLASTY Left 03/30/2013   Procedure: TOTAL KNEE ARTHROPLASTY;  Surgeon: Ninetta Lights, MD;  Location: [place];  Service: Orthopedics;  Laterality: Left;    There were no vitals filed for this visit.  Subjective Assessment - 06/11/17 1649    Subjective  I didn't sleep well last night thinking of everything I need to do before my surgery.  Pertinent History  bil mastectomy 06/22/17 upcoming    Currently in Pain?  Yes    Pain Score  3     Pain Location  Back    Pain Type  Chronic pain                      [facility] Adult PT Treatment/Exercise - 06/11/17 0001      Lumbar Exercises: Supine   Other Supine Lumbar Exercises  decompression series 5 reps each per patient instructions      Moist Heat Therapy   Number Minutes Moist Heat  15 Minutes    Moist Heat Location  Lumbar Spine      Electrical Stimulation   Electrical Stimulation Location  bil lumbar    Electrical Stimulation Action  IFC    Electrical Stimulation Parameters  12 ma 15 min supine    Electrical Stimulation Goals  Pain      Manual Therapy   Manual Therapy  Soft  tissue mobilization    Soft tissue mobilization  bil lumbar paraspinals, gluteals        Trigger Point Dry Needling - 06/11/17 1651    Consent Given?  Yes    Education Handout Provided  -- received in previous PT a few months ago    Muscles Treated Lower Body  Gluteus minimus;Gluteus maximus bil lumbar multifidi    Gluteus Maximus Response  Twitch response elicited;Palpable increased muscle length    Gluteus Minimus Response  Twitch response elicited;Palpable increased muscle length        Performed bilaterally.    PT Education - 06/11/17 1652    Education provided  Yes    Education Details  decompression series    Person(s) Educated  Patient    Methods  Explanation;Demonstration;Handout    Comprehension  Verbalized understanding;Returned demonstration       PT Short Term Goals - 06/09/17 1127      PT SHORT TERM GOAL #1   Title  STGs=LTGs      PT SHORT TERM GOAL #2   Title  ...      PT SHORT TERM GOAL #3   Title  ...      PT SHORT TERM GOAL #4   Title  ...        PT Long Term Goals - 06/09/17 1128      PT LONG TERM GOAL #1   Title  The patient will be independent in safe self progression of HEP, basic body mechanics and posture correction needed for further improvements in ROM and strength, function and decreased pain    Time  3    Period  Weeks    Status  New    Target Date  06/30/17      PT LONG TERM GOAL #2   Title  The patient will have improved lumbar flexion to 75 degrees, extension to 15 and bil sidebending to 25 degrees needed for improved mobility with ADLs    Time  3    Period  Weeks    Status  New      PT LONG TERM GOAL #3   Title  The patient will be able to activate transverse abdominus needed for rolling in bed and transferring from sit to stand with greater ease    Time  3    Period  Weeks    Status  New      PT LONG TERM GOAL #4   Title  The patient will report  a 25% improvement in back pain with ADLs    Time  3    Period  Weeks     Status  New      PT LONG TERM GOAL #5   Title  FOTO functional outcome improved to 47% indicating improved function with less pain     Time  3    Period  Weeks    Status  New            Plan - 06/11/17 1652    Clinical Impression Statement  The patient reports she can "already feel a change" following manual therapy and dry needling to lumbar and gluteal musculature bilaterally.  She is receptive to decompression exercises for postural alignment and muscular relaxation.  Good response to heat and ES.  Therapist closely monitoring response to all interventions.      Rehab Potential  Good    Clinical Impairments Affecting Rehab Potential  scheduled surgery 06/22/17    PT Frequency  2x / week    PT Duration  3 weeks    PT Treatment/Interventions  ADLs/Self Care Home Management;Cryotherapy;Electrical Stimulation;Moist Heat;Traction;Therapeutic activities;Therapeutic exercise;Patient/family education;Neuromuscular re-education;Dry needling;Taping;Manual techniques    PT Next Visit Plan  Will most likely see patient for 2 more  visits before scheduled surgery;  DN to lumbar multifidi with possible additon of ES with DN;   left gluteals;  left hip mobilizations, re-establish HEP;  work on log rolling to help with upcoming surgery       Patient will benefit from skilled therapeutic intervention in order to improve the following deficits and impairments:  Pain, Decreased strength, Decreased range of motion, Increased muscle spasms  Visit Diagnosis: Chronic left-sided low back pain without sciatica  Muscle weakness (generalized)     Problem List Patient Active Problem List   Diagnosis Date Noted  . 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 Genetic testing 04/17/2017  . DJD (degenerative joint disease) of knee 03/30/2013  . Unspecified sleep apnea 03/23/2013  . 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 Weakness 11/28/2011  . Hypertension 11/28/2011  . Hypokalemia 11/28/2011  .  LUE weakness 11/28/2011  . Hypothyroidism 11/28/2011  . Thyroid cancer ([place]) 04/28/2010   Ruben Im, PT 06/11/17 4:57 PM Phone: [phone] Fax: [phone]  Alvera Singh 06/11/2017, 4:56 PM   [facility] [address] [place], [place], [address] Phone: [phone]   Fax:  [phone]  Name: SHAMIYA DEMERITT MRN: [id number] Date of Birth: 1950-12-11

## 2017-06-12 DIAGNOSIS — E890 Postprocedural hypothyroidism: Secondary | ICD-10-CM | POA: Diagnosis not present

## 2017-06-12 DIAGNOSIS — I10 Essential (primary) hypertension: Secondary | ICD-10-CM | POA: Diagnosis not present

## 2017-06-12 DIAGNOSIS — C73 Malignant neoplasm of thyroid gland: Secondary | ICD-10-CM | POA: Diagnosis not present

## 2017-06-16 ENCOUNTER — Ambulatory Visit: Payer: Medicare Other | Admitting: Physical Therapy

## 2017-06-16 ENCOUNTER — Encounter (HOSPITAL_BASED_OUTPATIENT_CLINIC_OR_DEPARTMENT_OTHER): Payer: Self-pay | Admitting: *Deleted

## 2017-06-16 ENCOUNTER — Other Ambulatory Visit: Payer: Self-pay

## 2017-06-17 ENCOUNTER — Ambulatory Visit: Payer: Self-pay | Admitting: Plastic Surgery

## 2017-06-17 ENCOUNTER — Encounter (HOSPITAL_BASED_OUTPATIENT_CLINIC_OR_DEPARTMENT_OTHER)
Admission: RE | Admit: 2017-06-17 | Discharge: 2017-06-17 | Disposition: A | Discharge status: Home / Self Care | Payer: Medicare Other | Source: Ambulatory Visit | Attending: General Surgery | Admitting: General Surgery

## 2017-06-17 DIAGNOSIS — I10 Essential (primary) hypertension: Secondary | ICD-10-CM | POA: Diagnosis not present

## 2017-06-17 DIAGNOSIS — M25532 Pain in left wrist: Secondary | ICD-10-CM | POA: Diagnosis not present

## 2017-06-17 DIAGNOSIS — C50919 Malignant neoplasm of unspecified site of unspecified female breast: Secondary | ICD-10-CM

## 2017-06-17 DIAGNOSIS — Z01818 Encounter for other preprocedural examination: Secondary | ICD-10-CM | POA: Diagnosis not present

## 2017-06-17 DIAGNOSIS — Z01812 Encounter for preprocedural laboratory examination: Secondary | ICD-10-CM | POA: Insufficient documentation

## 2017-06-17 LAB — BASIC METABOLIC PANEL
Anion gap: 10 (ref 5–15)
BUN: 12 mg/dL (ref 6–20)
CALCIUM: 8.7 mg/dL — AB (ref 8.9–10.3)
CO2: 26 mmol/L (ref 22–32)
Chloride: 105 mmol/L (ref 101–111)
Creatinine, Ser: 0.94 mg/dL (ref 0.44–1.00)
GFR calc Af Amer: >60 mL/min (ref >60)
GLUCOSE: 97 mg/dL (ref 65–99)
Potassium: 3.4 mmol/L — ABNORMAL LOW (ref 3.5–5.1)
Sodium: 141 mmol/L (ref 135–145)

## 2017-06-17 NOTE — H&P (View-Only) (Signed)
[name] is an 67 y.o. female.   Chief Complaint: breast cancer HPI: The patient is a 67 yrs old bf here for another review and discussion about the surgery options.  She reviewed her decision with family and had more questions.  She was radiated on the right side and therefore will likely need a latissimus flap.  Her weight has been stable and she has not had any recent illnesses.  History:  She was diagnosed with new LEFT breast cancer, ductal carcinoma in-situ, ER / PR positive.  She had right breast cancer in the past and underwent a lumpectomy with radiation 15 years ago.  She received chemotherapy treatment and tamoxifen.  This Left breast cancer was detected by routine mammogram which showed calcifications in the upper outer quadrant ~ 5 cm in size.  She is 5 feet 4 inches tall, weight is 218 pounds.  Preop bra is 34-36 B. She is concerned about getting breast cancer again.  She has significant breast asymmetry with the left larger than the right due to the right lumpectomy and radiation.   Past Medical History:  Diagnosis Date  . Arthritis   . Breast cancer [facility]) 2004   right breast  . Breast cancer, left ([place])   . Bruises easily   . Cancer [facility]) 2004   rt breast;takes Arimidex daily;thyroid cancer  . Genetic testing 04/17/2017   STAT Breast panel with reflex to Multi-Cancer panel (83 genes) @ [facility] - No pathogenic mutations detected  . History of bronchitis    when she was young  . Hypertension    takes Tribenzor daily  . Hypothyroidism    takes Synthroid daily  . Joint pain   . Joint swelling   . Personal history of chemotherapy 04/2003  . Personal history of radiation therapy 2005  . Pneumonia    hx of when she was young  . Sleep apnea    has a machine but does not use  . Thyroid cancer ([place]) 2012  . Urinary frequency     Past Surgical History:  Procedure Laterality Date  . ANTERIOR CERVICAL DECOMP/DISCECTOMY FUSION  2008  . APPENDECTOMY  2007  . BREAST  BIOPSY Right 02/28/2003   malignant  . BREAST LUMPECTOMY  2004  . CESAREAN SECTION  77yrs ago  . COLONOSCOPY    . COLONOSCOPY    . KNEE ARTHROSCOPY  2007  . KNEE ARTHROSCOPY  05/02/2011   Procedure: ARTHROSCOPY KNEE;  Surgeon: Johnn Hai;  Location: [place];  Service: Orthopedics;  Laterality: Right;  . MENISCUS DEBRIDEMENT  05/02/2011   Procedure: DEBRIDEMENT OF MENISCUS;  Surgeon: Johnn Hai;  Location: [place];  Service: Orthopedics;  Laterality: Right;  . porta cath remove  2005  . PORTACATH PLACEMENT  2004  . portacath removal    . THYROID LOBECTOMY Right 09/22/2012   Dr Constance Holster  . THYROIDECTOMY N/A 09/22/2012   Procedure: RIGHT THYROID LOBECTOMY WITH FROZEN SECTION;  Surgeon: Izora Gala, MD;  Location: [place];  Service: ENT;  Laterality: N/A;  . THYROIDECTOMY Left 10/21/2012   Procedure: COMPLETION OF THYROIDECTOMY;  Surgeon: Izora Gala, MD;  Location: [place];  Service: ENT;  Laterality: Left;  . TOTAL KNEE ARTHROPLASTY  04/14/2012   Procedure: TOTAL KNEE ARTHROPLASTY;  Surgeon: Ninetta Lights, MD;  Location: [place];  Service: Orthopedics;  Laterality: Right;  RIGHT ARTHROPLASTY KNEE MEDIAL/LATERAL COMPARTMENTS WITH PATELLA RESURFACING  . TOTAL KNEE ARTHROPLASTY Left 03/30/2013   Procedure: TOTAL  KNEE ARTHROPLASTY;  Surgeon: Ninetta Lights, MD;  Location: [place];  Service: Orthopedics;  Laterality: Left;    Family History  Problem Relation Age of Onset  . Breast cancer Mother 22       currently 26  . Liver cancer Father   . Breast cancer Other        pat grandfather's sister; dx 83s   Social History:  reports that  has never smoked. she has never used smokeless tobacco. She reports that she drinks alcohol. She reports that she does not use drugs.  Allergies:  Allergies  Allergen Reactions  . Penicillins Other (See Comments)    Childhood reaction     (Not in a hospital admission)  No results found for this or any previous visit  (from the past 48 hour(s)). No results found.  Review of Systems  Constitutional: Negative.   HENT: Negative.   Eyes: Negative.   Respiratory: Negative.   Cardiovascular: Negative.   Gastrointestinal: Negative.   Genitourinary: Negative.   Musculoskeletal: Negative.   Skin: Negative.   Neurological: Negative.   Psychiatric/Behavioral: Negative.     There were no vitals taken for this visit. Physical Exam  Constitutional: She is oriented to person, place, and time. She appears well-developed and well-nourished.  HENT:  Head: Normocephalic and atraumatic.  Eyes: EOM are normal. Pupils are equal, round, and reactive to light.  Cardiovascular: Normal rate.  Respiratory: Effort normal. No respiratory distress.  GI: Soft. She exhibits no distension.  Neurological: She is alert and oriented to person, place, and time.  Psychiatric: She has a normal mood and affect. Her behavior is normal. Judgment and thought content normal.     Assessment/Plan We had a long discussion about the options for breast reconstruction and she would like to move ahead with bilateral mastectomies with immediate reconstruction with expanders and flexhd.  We will expander as able.  It and when the right breast reaches a limit we will need to decide on a latissimus flap the size at the limit.  This was reviewed in detail with the patient.  [place], DO 06/17/2017, 8:19 AM

## 2017-06-17 NOTE — Progress Notes (Signed)
Ensure pre surgery drink given with instructions to complete by [place], surgical scrub soap given with instructions, pt verbalized understanding.

## 2017-06-17 NOTE — H&P (Signed)
[name] is an 67 y.o. female.   Chief Complaint: breast cancer HPI: The patient is a 67 yrs old bf here for another review and discussion about the surgery options.  She reviewed her decision with family and had more questions.  She was radiated on the right side and therefore will likely need a latissimus flap.  Her weight has been stable and she has not had any recent illnesses.  History:  She was diagnosed with new LEFT breast cancer, ductal carcinoma in-situ, ER / PR positive.  She had right breast cancer in the past and underwent a lumpectomy with radiation 15 years ago.  She received chemotherapy treatment and tamoxifen.  This Left breast cancer was detected by routine mammogram which showed calcifications in the upper outer quadrant ~ 5 cm in size.  She is 5 feet 4 inches tall, weight is 218 pounds.  Preop bra is 34-36 B. She is concerned about getting breast cancer again.  She has significant breast asymmetry with the left larger than the right due to the right lumpectomy and radiation.   Past Medical History:  Diagnosis Date  . Arthritis   . Breast cancer [facility]) 2004   right breast  . Breast cancer, left ([place])   . Bruises easily   . Cancer [facility]) 2004   rt breast;takes Arimidex daily;thyroid cancer  . Genetic testing 04/17/2017   STAT Breast panel with reflex to Multi-Cancer panel (83 genes) @ [facility] - No pathogenic mutations detected  . History of bronchitis    when she was young  . Hypertension    takes Tribenzor daily  . Hypothyroidism    takes Synthroid daily  . Joint pain   . Joint swelling   . Personal history of chemotherapy 04/2003  . Personal history of radiation therapy 2005  . Pneumonia    hx of when she was young  . Sleep apnea    has a machine but does not use  . Thyroid cancer ([place]) 2012  . Urinary frequency     Past Surgical History:  Procedure Laterality Date  . ANTERIOR CERVICAL DECOMP/DISCECTOMY FUSION  2008  . APPENDECTOMY  2007  . BREAST  BIOPSY Right 02/28/2003   malignant  . BREAST LUMPECTOMY  2004  . CESAREAN SECTION  22yrs ago  . COLONOSCOPY    . COLONOSCOPY    . KNEE ARTHROSCOPY  2007  . KNEE ARTHROSCOPY  05/02/2011   Procedure: ARTHROSCOPY KNEE;  Surgeon: Johnn Hai;  Location: [place];  Service: Orthopedics;  Laterality: Right;  . MENISCUS DEBRIDEMENT  05/02/2011   Procedure: DEBRIDEMENT OF MENISCUS;  Surgeon: Johnn Hai;  Location: [place];  Service: Orthopedics;  Laterality: Right;  . porta cath remove  2005  . PORTACATH PLACEMENT  2004  . portacath removal    . THYROID LOBECTOMY Right 09/22/2012   Dr Constance Holster  . THYROIDECTOMY N/A 09/22/2012   Procedure: RIGHT THYROID LOBECTOMY WITH FROZEN SECTION;  Surgeon: Izora Gala, MD;  Location: [place];  Service: ENT;  Laterality: N/A;  . THYROIDECTOMY Left 10/21/2012   Procedure: COMPLETION OF THYROIDECTOMY;  Surgeon: Izora Gala, MD;  Location: [place];  Service: ENT;  Laterality: Left;  . TOTAL KNEE ARTHROPLASTY  04/14/2012   Procedure: TOTAL KNEE ARTHROPLASTY;  Surgeon: Ninetta Lights, MD;  Location: [place];  Service: Orthopedics;  Laterality: Right;  RIGHT ARTHROPLASTY KNEE MEDIAL/LATERAL COMPARTMENTS WITH PATELLA RESURFACING  . TOTAL KNEE ARTHROPLASTY Left 03/30/2013   Procedure: TOTAL  KNEE ARTHROPLASTY;  Surgeon: Ninetta Lights, MD;  Location: [place];  Service: Orthopedics;  Laterality: Left;    Family History  Problem Relation Age of Onset  . Breast cancer Mother 82       currently 19  . Liver cancer Father   . Breast cancer Other        pat grandfather's sister; dx 56s   Social History:  reports that  has never smoked. she has never used smokeless tobacco. She reports that she drinks alcohol. She reports that she does not use drugs.  Allergies:  Allergies  Allergen Reactions  . Penicillins Other (See Comments)    Childhood reaction     (Not in a hospital admission)  No results found for this or any previous visit  (from the past 48 hour(s)). No results found.  Review of Systems  Constitutional: Negative.   HENT: Negative.   Eyes: Negative.   Respiratory: Negative.   Cardiovascular: Negative.   Gastrointestinal: Negative.   Genitourinary: Negative.   Musculoskeletal: Negative.   Skin: Negative.   Neurological: Negative.   Psychiatric/Behavioral: Negative.     There were no vitals taken for this visit. Physical Exam  Constitutional: She is oriented to person, place, and time. She appears well-developed and well-nourished.  HENT:  Head: Normocephalic and atraumatic.  Eyes: EOM are normal. Pupils are equal, round, and reactive to light.  Cardiovascular: Normal rate.  Respiratory: Effort normal. No respiratory distress.  GI: Soft. She exhibits no distension.  Neurological: She is alert and oriented to person, place, and time.  Psychiatric: She has a normal mood and affect. Her behavior is normal. Judgment and thought content normal.     Assessment/Plan We had a long discussion about the options for breast reconstruction and she would like to move ahead with bilateral mastectomies with immediate reconstruction with expanders and flexhd.  We will expander as able.  It and when the right breast reaches a limit we will need to decide on a latissimus flap the size at the limit.  This was reviewed in detail with the patient.  [place], DO 06/17/2017, 8:19 AM

## 2017-06-18 ENCOUNTER — Encounter: Payer: Self-pay | Admitting: Physical Therapy

## 2017-06-18 ENCOUNTER — Ambulatory Visit: Payer: Medicare Other | Admitting: Physical Therapy

## 2017-06-18 DIAGNOSIS — G8929 Other chronic pain: Secondary | ICD-10-CM | POA: Diagnosis not present

## 2017-06-18 DIAGNOSIS — M6281 Muscle weakness (generalized): Secondary | ICD-10-CM | POA: Diagnosis not present

## 2017-06-18 DIAGNOSIS — M545 Low back pain, unspecified: Secondary | ICD-10-CM

## 2017-06-18 NOTE — Therapy (Signed)
[facility] [address], [place], [place], [address] Phone: [phone]   Fax:  [phone]  Physical Therapy Treatment/Discharge Summary  Patient Details  Name: [name] MRN: [id number] Date of Birth: June 04, 1950 Referring Provider: Dr. Sherwood Gambler   Encounter Date: 06/18/2017  PT End of Session - 06/18/17 1323    Visit Number  3    Date for PT Re-Evaluation  06/30/17    Authorization Type  Medicare;  KX at visit 15    PT Start Time  0800    PT Stop Time  0850    PT Time Calculation (min)  50 min    Activity Tolerance  Patient tolerated treatment well       Past Medical History:  Diagnosis Date  . Arthritis   . Breast cancer [facility]) 2004   right breast  . Breast cancer, left ([place])   . Bruises easily   . Cancer [facility]) 2004   rt breast;takes Arimidex daily;thyroid cancer  . Genetic testing 04/17/2017   STAT Breast panel with reflex to Multi-Cancer panel (83 genes) @ [facility] - No pathogenic mutations detected  . History of bronchitis    when she was young  . Hypertension    takes Tribenzor daily  . Hypothyroidism    takes Synthroid daily  . Joint pain   . Joint swelling   . Personal history of chemotherapy 04/2003  . Personal history of radiation therapy 2005  . Pneumonia    hx of when she was young  . Sleep apnea    has a machine but does not use  . Thyroid cancer ([place]) 2012  . Urinary frequency     Past Surgical History:  Procedure Laterality Date  . ANTERIOR CERVICAL DECOMP/DISCECTOMY FUSION  2008  . APPENDECTOMY  2007  . BREAST BIOPSY Right 02/28/2003   malignant  . BREAST LUMPECTOMY  2004  . CESAREAN SECTION  53yr ago  . COLONOSCOPY    . COLONOSCOPY    . KNEE ARTHROSCOPY  2007  . KNEE ARTHROSCOPY  05/02/2011   Procedure: ARTHROSCOPY KNEE;  Surgeon: JJohnn Hai  [place]  Service: Orthopedics;  Laterality: Right;  . MENISCUS DEBRIDEMENT  05/02/2011   Procedure: DEBRIDEMENT OF MENISCUS;  Surgeon: JJohnn Hai  [place]  Service: Orthopedics;  Laterality: Right;  . porta cath remove  2005  . PORTACATH PLACEMENT  2004  . portacath removal    . THYROID LOBECTOMY Right 09/22/2012   Dr RConstance Holster . THYROIDECTOMY N/A 09/22/2012   Procedure: RIGHT THYROID LOBECTOMY WITH FROZEN SECTION;  Surgeon: JIzora Gala MD;  [place]  Service: ENT;  Laterality: N/A;  . THYROIDECTOMY Left 10/21/2012   Procedure: COMPLETION OF THYROIDECTOMY;  Surgeon: JIzora Gala MD;  [place]  Service: ENT;  Laterality: Left;  . TOTAL KNEE ARTHROPLASTY  04/14/2012   Procedure: TOTAL KNEE ARTHROPLASTY;  Surgeon: DNinetta Lights MD;  [place]  Service: Orthopedics;  Laterality: Right;  RIGHT ARTHROPLASTY KNEE MEDIAL/LATERAL COMPARTMENTS WITH PATELLA RESURFACING  . TOTAL KNEE ARTHROPLASTY Left 03/30/2013   Procedure: TOTAL KNEE ARTHROPLASTY;  Surgeon: DNinetta Lights MD;  [place]  Service: Orthopedics;  Laterality: Left;    There were no vitals filed for this visit.  Subjective Assessment - 06/18/17 0805    Subjective  I saw Dr. MPercell Millerand he gave me a PT order for hand/elbow pain.  Patient plans on keeping this until  later after her surgery next week.  Spasms/cramps only 1-2x a week now instead of 2x/day.       Pertinent History  bil mastectomy 06/22/17 upcoming    Currently in Pain?  Yes    Pain Score  6     Pain Location  Back    Pain Orientation  Lower    Aggravating Factors   mornings; mid day         [facility] PT Assessment - 06/18/17 0001      Observation/Other Assessments   Focus on Therapeutic Outcomes (FOTO)   46% limitation       AROM   Lumbar Flexion  80    Lumbar Extension  10    Lumbar - Right Side Bend  15    Lumbar - Left Side Bend  15      Strength   Right/Left Hip  -- bil hip abductors and extensors 4/5    Lumbar Flexion  3/5    Lumbar Extension  4/5 decreased activation of transverse  abdominus                  [facility] Adult PT Treatment/Exercise - 06/18/17 0001      Self-Care   Self-Care  Other Self-Care Comments    Other Self-Care Comments   Discussed a body pillow and upper body wedge for sleeping comfort following surgery next week       Lumbar Exercises: Supine   Ab Set  5 reps    Other Supine Lumbar Exercises  review of gluteal sets, pillow squeezes for gentle core strengthening following surgery next week      Moist Heat Therapy   Number Minutes Moist Heat  15 Minutes    Moist Heat Location  Lumbar Spine      Electrical Stimulation   Electrical Stimulation Location  bil lumbar    Electrical Stimulation Action  IFC    Electrical Stimulation Parameters  12 ma 15 min    Electrical Stimulation Goals  Pain      Manual Therapy   Soft tissue mobilization  bil lumbar paraspinals, gluteals        Trigger Point Dry Needling - 06/18/17 1320    Consent Given?  Yes    Muscles Treated Lower Body  -- bil lumbar multifidi     Gluteus Maximus Response  Twitch response elicited;Palpable increased muscle length    Gluteus Minimus Response  Twitch response elicited;Palpable increased muscle length             PT Short Term Goals - 06/09/17 1127      PT SHORT TERM GOAL #1   Title  STGs=LTGs      PT SHORT TERM GOAL #2   Title  ...      PT SHORT TERM GOAL #3   Title  ...      PT SHORT TERM GOAL #4   Title  ...        PT Long Term Goals - 06/18/17 1334      PT LONG TERM GOAL #1   Title  The patient will be independent in safe self progression of HEP, basic body mechanics and posture correction needed for further improvements in ROM and strength, function and decreased pain    Status  Achieved      PT LONG TERM GOAL #2   Title  The patient will have improved lumbar flexion to 75 degrees, extension to 15 and bil sidebending to 25 degrees needed for improved  mobility with ADLs    Status  Partially Met      PT LONG TERM GOAL #3   Title   The patient will be able to activate transverse abdominus needed for rolling in bed and transferring from sit to stand with greater ease    Status  Achieved      PT LONG TERM GOAL #4   Title  The patient will report a 25% improvement in back pain with ADLs    Status  Achieved      PT LONG TERM GOAL #5   Title  FOTO functional outcome improved to 47% indicating improved function with less pain     Status  Partially Met            Plan - 06/18/17 1323    Clinical Impression Statement  The patient has attended 3 PT visits to help her manage her low back pain prior to bil mastectomy surgery on Monday.  She has had a good response to dry needling, manual therapy and to reestablish a basic HEP including appropriate low back exercises while recovering from surgery.  We have also discussed positioning for sleep to protect her low back.  Even after just 3 visits, she has made a good improvement in FOTO functional outcome score from 57% limitation to 46%.  Her lumbar ROM has minimally changed.  Improved activation of transversus abdominus.  Will discharge at this time secondary to majority of goals met and in anticipation of a status change with upcoming surgery.      Recommended Other Services  2nd attempt cert rerouted for signature       PHYSICAL THERAPY DISCHARGE SUMMARY  Visits from Start of Care: 3  Current functional level related to goals / functional outcomes: See clinical impressions above   Remaining deficits: As above   Education / Equipment: Basic back self care and HEP Plan: Patient agrees to discharge.  Patient goals were partially met. Patient is being discharged due to a change in medical status.  ?????          Patient will benefit from skilled therapeutic intervention in order to improve the following deficits and impairments:  Pain, Decreased strength, Decreased range of motion, Increased muscle spasms  Visit Diagnosis: Chronic left-sided low back pain  without sciatica  Muscle weakness (generalized)     Problem List Patient Active Problem List   Diagnosis Date Noted  . 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 Genetic testing 04/17/2017  . DJD (degenerative joint disease) of knee 03/30/2013  . Unspecified sleep apnea 03/23/2013  . 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 Weakness 11/28/2011  . Hypertension 11/28/2011  . Hypokalemia 11/28/2011  . LUE weakness 11/28/2011  . Hypothyroidism 11/28/2011  . Thyroid cancer ([place]) 04/28/2010    Ruben Im, PT 06/18/17 1:40 PM Phone: [phone] Fax: [phone]  Alvera Singh 06/18/2017, 1:39 PM  [facility] [address], [place], [place], [address] Phone: [phone]   Fax:  [phone]  Name: [name] MRN: [id number] Date of Birth: [birth year]

## 2017-06-22 ENCOUNTER — Encounter (HOSPITAL_COMMUNITY)
Admission: RE | Admit: 2017-06-22 | Discharge: 2017-06-22 | Disposition: A | Discharge status: Home / Self Care | Payer: Medicare Other | Source: Ambulatory Visit | Attending: General Surgery | Admitting: General Surgery

## 2017-06-22 ENCOUNTER — Ambulatory Visit (HOSPITAL_BASED_OUTPATIENT_CLINIC_OR_DEPARTMENT_OTHER): Payer: Medicare Other | Admitting: Certified Registered Nurse Anesthetist

## 2017-06-22 ENCOUNTER — Ambulatory Visit (HOSPITAL_BASED_OUTPATIENT_CLINIC_OR_DEPARTMENT_OTHER)
Admission: RE | Admit: 2017-06-22 | Discharge: 2017-06-23 | Disposition: A | Discharge status: Home / Self Care | Payer: Medicare Other | Source: Ambulatory Visit | Attending: Plastic Surgery | Admitting: Plastic Surgery

## 2017-06-22 ENCOUNTER — Encounter (HOSPITAL_BASED_OUTPATIENT_CLINIC_OR_DEPARTMENT_OTHER)
Admission: RE | Disposition: A | Discharge status: Home / Self Care | Payer: Self-pay | Source: Ambulatory Visit | Attending: Plastic Surgery

## 2017-06-22 ENCOUNTER — Encounter (HOSPITAL_BASED_OUTPATIENT_CLINIC_OR_DEPARTMENT_OTHER): Payer: Self-pay | Admitting: *Deleted

## 2017-06-22 ENCOUNTER — Other Ambulatory Visit: Payer: Self-pay

## 2017-06-22 DIAGNOSIS — Z79899 Other long term (current) drug therapy: Secondary | ICD-10-CM | POA: Insufficient documentation

## 2017-06-22 DIAGNOSIS — G8918 Other acute postprocedural pain: Secondary | ICD-10-CM | POA: Diagnosis not present

## 2017-06-22 DIAGNOSIS — I10 Essential (primary) hypertension: Secondary | ICD-10-CM | POA: Diagnosis not present

## 2017-06-22 DIAGNOSIS — D0512 Intraductal carcinoma in situ of left breast: Secondary | ICD-10-CM

## 2017-06-22 DIAGNOSIS — E039 Hypothyroidism, unspecified: Secondary | ICD-10-CM | POA: Diagnosis not present

## 2017-06-22 DIAGNOSIS — Z853 Personal history of malignant neoplasm of breast: Secondary | ICD-10-CM | POA: Insufficient documentation

## 2017-06-22 DIAGNOSIS — Z421 Encounter for breast reconstruction following mastectomy: Secondary | ICD-10-CM | POA: Diagnosis not present

## 2017-06-22 DIAGNOSIS — Z923 Personal history of irradiation: Secondary | ICD-10-CM | POA: Diagnosis not present

## 2017-06-22 DIAGNOSIS — Z85850 Personal history of malignant neoplasm of thyroid: Secondary | ICD-10-CM | POA: Insufficient documentation

## 2017-06-22 DIAGNOSIS — G4730 Sleep apnea, unspecified: Secondary | ICD-10-CM | POA: Insufficient documentation

## 2017-06-22 DIAGNOSIS — Z9221 Personal history of antineoplastic chemotherapy: Secondary | ICD-10-CM | POA: Diagnosis not present

## 2017-06-22 DIAGNOSIS — N6031 Fibrosclerosis of right breast: Secondary | ICD-10-CM | POA: Insufficient documentation

## 2017-06-22 DIAGNOSIS — Z880 Allergy status to penicillin: Secondary | ICD-10-CM | POA: Insufficient documentation

## 2017-06-22 DIAGNOSIS — C50919 Malignant neoplasm of unspecified site of unspecified female breast: Secondary | ICD-10-CM

## 2017-06-22 DIAGNOSIS — C50512 Malignant neoplasm of lower-outer quadrant of left female breast: Secondary | ICD-10-CM | POA: Diagnosis not present

## 2017-06-22 DIAGNOSIS — C50912 Malignant neoplasm of unspecified site of left female breast: Secondary | ICD-10-CM | POA: Diagnosis not present

## 2017-06-22 HISTORY — PX: BREAST RECONSTRUCTION WITH PLACEMENT OF TISSUE EXPANDER AND FLEX HD (ACELLULAR HYDRATED DERMIS): SHX6295

## 2017-06-22 HISTORY — PX: MASTECTOMY W/ SENTINEL NODE BIOPSY: SHX2001

## 2017-06-22 SURGERY — MASTECTOMY WITH SENTINEL LYMPH NODE BIOPSY
Anesthesia: General | Site: Breast | Laterality: Bilateral

## 2017-06-22 MED ORDER — DIPHENHYDRAMINE HCL 50 MG/ML IJ SOLN: 12.5000 mg | Freq: Four times a day (QID) | INTRAMUSCULAR | Status: DC | PRN

## 2017-06-22 MED ORDER — PROMETHAZINE HCL 25 MG/ML IJ SOLN: 6.2500 mg | INTRAMUSCULAR | Status: DC | PRN

## 2017-06-22 MED ORDER — SUCCINYLCHOLINE CHLORIDE 20 MG/ML IJ SOLN
INTRAMUSCULAR | Status: DC | PRN
  Administered 2017-06-22: 120 mg via INTRAVENOUS

## 2017-06-22 MED ORDER — ONDANSETRON 4 MG PO TBDP: 4.0000 mg | ORAL_TABLET | Freq: Four times a day (QID) | ORAL | Status: DC | PRN

## 2017-06-22 MED ORDER — GABAPENTIN 300 MG PO CAPS
ORAL_CAPSULE | ORAL | Status: AC
  Filled 2017-06-22: qty 1

## 2017-06-22 MED ORDER — HYDROMORPHONE HCL 1 MG/ML IJ SOLN: 0.2500 mg | INTRAMUSCULAR | Status: DC | PRN

## 2017-06-22 MED ORDER — FENTANYL CITRATE (PF) 100 MCG/2ML IJ SOLN
50.0000 ug | INTRAMUSCULAR | Status: DC | PRN
  Administered 2017-06-22 (×2): 50 ug via INTRAVENOUS

## 2017-06-22 MED ORDER — DIAZEPAM 2 MG PO TABS: 2.0000 mg | ORAL_TABLET | Freq: Two times a day (BID) | ORAL | Status: DC | PRN

## 2017-06-22 MED ORDER — HYDROCODONE-ACETAMINOPHEN 5-325 MG PO TABS: 1.0000 | ORAL_TABLET | ORAL | Status: DC | PRN

## 2017-06-22 MED ORDER — KETOROLAC TROMETHAMINE 30 MG/ML IJ SOLN: 30.0000 mg | Freq: Once | INTRAMUSCULAR | Status: DC | PRN

## 2017-06-22 MED ORDER — CHLORHEXIDINE GLUCONATE CLOTH 2 % EX PADS: 6.0000 | MEDICATED_PAD | Freq: Once | CUTANEOUS | Status: DC

## 2017-06-22 MED ORDER — MIDAZOLAM HCL 2 MG/2ML IJ SOLN
1.0000 mg | INTRAMUSCULAR | Status: DC | PRN
  Administered 2017-06-22: 2 mg via INTRAVENOUS

## 2017-06-22 MED ORDER — ACETAMINOPHEN 500 MG PO TABS
1000.0000 mg | ORAL_TABLET | ORAL | Status: AC
  Administered 2017-06-22: 1000 mg via ORAL

## 2017-06-22 MED ORDER — PROPOFOL 10 MG/ML IV BOLUS
INTRAVENOUS | Status: AC
  Filled 2017-06-22: qty 20

## 2017-06-22 MED ORDER — GABAPENTIN 300 MG PO CAPS
300.0000 mg | ORAL_CAPSULE | ORAL | Status: AC
  Administered 2017-06-22: 300 mg via ORAL

## 2017-06-22 MED ORDER — SENNA 8.6 MG PO TABS: 1.0000 | ORAL_TABLET | Freq: Two times a day (BID) | ORAL | Status: DC

## 2017-06-22 MED ORDER — ONDANSETRON HCL 4 MG/2ML IJ SOLN
4.0000 mg | Freq: Four times a day (QID) | INTRAMUSCULAR | Status: DC | PRN
  Administered 2017-06-22: 4 mg via INTRAVENOUS
  Filled 2017-06-22: qty 2

## 2017-06-22 MED ORDER — CELECOXIB 200 MG PO CAPS
200.0000 mg | ORAL_CAPSULE | ORAL | Status: AC
  Administered 2017-06-22: 200 mg via ORAL

## 2017-06-22 MED ORDER — ACETAMINOPHEN 500 MG PO TABS
ORAL_TABLET | ORAL | Status: AC
  Filled 2017-06-22: qty 2

## 2017-06-22 MED ORDER — EPHEDRINE SULFATE 50 MG/ML IJ SOLN
INTRAMUSCULAR | Status: DC | PRN
  Administered 2017-06-22 (×6): 10 mg via INTRAVENOUS

## 2017-06-22 MED ORDER — ONDANSETRON HCL 4 MG/2ML IJ SOLN
INTRAMUSCULAR | Status: DC | PRN
  Administered 2017-06-22: 4 mg via INTRAVENOUS

## 2017-06-22 MED ORDER — CIPROFLOXACIN IN D5W 400 MG/200ML IV SOLN: 400.0000 mg | INTRAVENOUS | Status: DC

## 2017-06-22 MED ORDER — DIPHENHYDRAMINE HCL 12.5 MG/5ML PO ELIX: 12.5000 mg | ORAL_SOLUTION | Freq: Four times a day (QID) | ORAL | Status: DC | PRN

## 2017-06-22 MED ORDER — VANCOMYCIN HCL IN DEXTROSE 1-5 GM/200ML-% IV SOLN
1000.0000 mg | INTRAVENOUS | Status: AC
  Administered 2017-06-22: 1000 mg via INTRAVENOUS

## 2017-06-22 MED ORDER — FENTANYL CITRATE (PF) 100 MCG/2ML IJ SOLN
INTRAMUSCULAR | Status: AC
  Filled 2017-06-22: qty 2

## 2017-06-22 MED ORDER — LIDOCAINE HCL (CARDIAC) 20 MG/ML IV SOLN
INTRAVENOUS | Status: DC | PRN
  Administered 2017-06-22: 80 mg via INTRAVENOUS

## 2017-06-22 MED ORDER — NAPROXEN 500 MG PO TABS: 500.0000 mg | ORAL_TABLET | Freq: Two times a day (BID) | ORAL | Status: DC | PRN

## 2017-06-22 MED ORDER — KCL IN DEXTROSE-NACL 20-5-0.45 MEQ/L-%-% IV SOLN
INTRAVENOUS | Status: DC
  Administered 2017-06-22: 14:00:00 via INTRAVENOUS
  Filled 2017-06-22: qty 1000

## 2017-06-22 MED ORDER — PROPOFOL 10 MG/ML IV BOLUS
INTRAVENOUS | Status: DC | PRN
  Administered 2017-06-22: 180 mg via INTRAVENOUS

## 2017-06-22 MED ORDER — VANCOMYCIN HCL IN DEXTROSE 1-5 GM/200ML-% IV SOLN
INTRAVENOUS | Status: AC
Start: 2017-06-22 — End: 2017-06-22
  Filled 2017-06-22: qty 200

## 2017-06-22 MED ORDER — FENTANYL CITRATE (PF) 100 MCG/2ML IJ SOLN: INTRAMUSCULAR | Status: DC | PRN

## 2017-06-22 MED ORDER — HYDROMORPHONE HCL 1 MG/ML IJ SOLN: 1.0000 mg | INTRAMUSCULAR | Status: DC | PRN

## 2017-06-22 MED ORDER — MIDAZOLAM HCL 2 MG/2ML IJ SOLN
INTRAMUSCULAR | Status: AC
Start: 2017-06-22 — End: 2017-06-22
  Filled 2017-06-22: qty 2

## 2017-06-22 MED ORDER — CIPROFLOXACIN IN D5W 400 MG/200ML IV SOLN
400.0000 mg | Freq: Two times a day (BID) | INTRAVENOUS | Status: DC
  Administered 2017-06-22 – 2017-06-23 (×2): 400 mg via INTRAVENOUS
  Filled 2017-06-22 (×2): qty 200

## 2017-06-22 MED ORDER — LACTATED RINGERS IV SOLN
INTRAVENOUS | Status: DC
  Administered 2017-06-22 (×3): via INTRAVENOUS

## 2017-06-22 MED ORDER — CELECOXIB 200 MG PO CAPS
ORAL_CAPSULE | ORAL | Status: AC
  Filled 2017-06-22: qty 1

## 2017-06-22 MED ORDER — FENTANYL CITRATE (PF) 100 MCG/2ML IJ SOLN
INTRAMUSCULAR | Status: DC | PRN
  Administered 2017-06-22 (×8): 25 ug via INTRAVENOUS

## 2017-06-22 MED ORDER — ROPIVACAINE HCL 5 MG/ML IJ SOLN
INTRAMUSCULAR | Status: DC | PRN
  Administered 2017-06-22: 30 mL

## 2017-06-22 MED ORDER — DEXAMETHASONE SODIUM PHOSPHATE 10 MG/ML IJ SOLN
INTRAMUSCULAR | Status: AC
  Filled 2017-06-22: qty 1

## 2017-06-22 MED ORDER — TECHNETIUM TC 99M SULFUR COLLOID FILTERED
1.0000 | Freq: Once | INTRAVENOUS | Status: AC | PRN
  Administered 2017-06-22: 1 via INTRADERMAL

## 2017-06-22 MED ORDER — SCOPOLAMINE 1 MG/3DAYS TD PT72: 1.0000 | MEDICATED_PATCH | Freq: Once | TRANSDERMAL | Status: DC | PRN

## 2017-06-22 MED ORDER — SUCCINYLCHOLINE CHLORIDE 200 MG/10ML IV SOSY
PREFILLED_SYRINGE | INTRAVENOUS | Status: AC
  Filled 2017-06-22: qty 10

## 2017-06-22 MED ORDER — LEVOTHYROXINE SODIUM 88 MCG PO TABS: 88.0000 ug | ORAL_TABLET | Freq: Every day | ORAL | Status: DC

## 2017-06-22 MED ORDER — DEXAMETHASONE SODIUM PHOSPHATE 4 MG/ML IJ SOLN
INTRAMUSCULAR | Status: DC | PRN
  Administered 2017-06-22: 10 mg via INTRAVENOUS

## 2017-06-22 MED ORDER — ONDANSETRON HCL 4 MG/2ML IJ SOLN
INTRAMUSCULAR | Status: AC
  Filled 2017-06-22: qty 2

## 2017-06-22 MED ORDER — LIDOCAINE 2% (20 MG/ML) 5 ML SYRINGE
INTRAMUSCULAR | Status: AC
  Filled 2017-06-22: qty 5

## 2017-06-22 SURGICAL SUPPLY — 86 items
ADH SKN CLS APL DERMABOND .7 (GAUZE/BANDAGES/DRESSINGS) ×1
APPLIER CLIP 9.375 MED OPEN (MISCELLANEOUS) ×2
APR CLP MED 9.3 20 MLT OPN (MISCELLANEOUS) ×1
BAG DECANTER FOR FLEXI CONT (MISCELLANEOUS) ×2 IMPLANT
BINDER BREAST XLRG (GAUZE/BANDAGES/DRESSINGS) IMPLANT
BINDER BREAST XXLRG (GAUZE/BANDAGES/DRESSINGS) ×1 IMPLANT
BIOPATCH RED 1 DISK 7.0 (GAUZE/BANDAGES/DRESSINGS) ×3 IMPLANT
BLADE HEX COATED 2.75 (ELECTRODE) ×2 IMPLANT
BLADE SURG 10 STRL SS (BLADE) ×2 IMPLANT
BLADE SURG 15 STRL LF DISP TIS (BLADE) ×2 IMPLANT
BLADE SURG 15 STRL SS (BLADE) ×6
BNDG GAUZE ELAST 4 BULKY (GAUZE/BANDAGES/DRESSINGS) ×2 IMPLANT
CANISTER SUCT 1200ML W/VALVE (MISCELLANEOUS) ×4 IMPLANT
CHLORAPREP W/TINT 26ML (MISCELLANEOUS) ×2 IMPLANT
CLIP APPLIE 9.375 MED OPEN (MISCELLANEOUS) ×1 IMPLANT
COVER BACK TABLE 60X90IN (DRAPES) ×3 IMPLANT
COVER MAYO STAND STRL (DRAPES) ×4 IMPLANT
COVER PROBE W GEL 5X96 (DRAPES) ×2 IMPLANT
DECANTER SPIKE VIAL GLASS SM (MISCELLANEOUS) IMPLANT
DERMABOND ADVANCED (GAUZE/BANDAGES/DRESSINGS) ×1
DERMABOND ADVANCED .7 DNX12 (GAUZE/BANDAGES/DRESSINGS) ×1 IMPLANT
DEVICE DISSECT PLASMABLAD 3.0S (MISCELLANEOUS) ×1 IMPLANT
DRAIN CHANNEL 19F RND (DRAIN) ×1 IMPLANT
DRAPE LAPAROSCOPIC ABDOMINAL (DRAPES) ×2 IMPLANT
DRAPE SURG 17X23 STRL (DRAPES) ×4 IMPLANT
DRAPE UTILITY XL STRL (DRAPES) ×2 IMPLANT
DRSG PAD ABDOMINAL 8X10 ST (GAUZE/BANDAGES/DRESSINGS) ×4 IMPLANT
ELECT BLADE 4.0 EZ CLEAN MEGAD (MISCELLANEOUS) ×2
ELECT COATED BLADE 2.86 ST (ELECTRODE) ×2 IMPLANT
ELECT REM PT RETURN 9FT ADLT (ELECTROSURGICAL) ×4
ELECTRODE BLDE 4.0 EZ CLN MEGD (MISCELLANEOUS) ×1 IMPLANT
ELECTRODE REM PT RTRN 9FT ADLT (ELECTROSURGICAL) ×1 IMPLANT
EVACUATOR SILICONE 100CC (DRAIN) ×1 IMPLANT
GAUZE SPONGE 4X4 12PLY STRL LF (GAUZE/BANDAGES/DRESSINGS) ×2 IMPLANT
GLOVE BIO SURGEON STRL SZ 6.5 (GLOVE) ×5 IMPLANT
GLOVE BIO SURGEON STRL SZ7 (GLOVE) ×3 IMPLANT
GLOVE BIO SURGEON STRL SZ7.5 (GLOVE) ×3 IMPLANT
GLOVE BIOGEL PI IND STRL 7.0 (GLOVE) IMPLANT
GLOVE BIOGEL PI INDICATOR 7.0 (GLOVE) ×1
GLOVE ECLIPSE 6.5 STRL STRAW (GLOVE) ×1 IMPLANT
GOWN STRL REUS W/ TWL LRG LVL3 (GOWN DISPOSABLE) ×5 IMPLANT
GOWN STRL REUS W/ TWL XL LVL3 (GOWN DISPOSABLE) IMPLANT
GOWN STRL REUS W/TWL LRG LVL3 (GOWN DISPOSABLE) ×8
GOWN STRL REUS W/TWL XL LVL3 (GOWN DISPOSABLE) ×4
GRAFT FLEX HD 4X16 THICK (Tissue Mesh) ×2 IMPLANT
ILLUMINATOR WAVEGUIDE N/F (MISCELLANEOUS) IMPLANT
IMPL EXPANDER BREAST 475CC (Breast) IMPLANT
IMPLANT BREAST 475CC (Breast) ×2 IMPLANT
IMPLANT EXPANDER BREAST 475CC (Breast) ×2 IMPLANT
IV NS 1000ML (IV SOLUTION) ×2
IV NS 1000ML BAXH (IV SOLUTION) IMPLANT
IV NS 500ML (IV SOLUTION)
IV NS 500ML BAXH (IV SOLUTION) ×1 IMPLANT
KIT FILL SYSTEM UNIVERSAL (SET/KITS/TRAYS/PACK) IMPLANT
LIGHT WAVEGUIDE WIDE FLAT (MISCELLANEOUS) IMPLANT
NDL HYPO 25X1 1.5 SAFETY (NEEDLE) ×1 IMPLANT
NEEDLE HYPO 25X1 1.5 SAFETY (NEEDLE) IMPLANT
NS IRRIG 1000ML POUR BTL (IV SOLUTION) ×2 IMPLANT
PACK BASIN DAY SURGERY FS (CUSTOM PROCEDURE TRAY) ×4 IMPLANT
PENCIL BUTTON HOLSTER BLD 10FT (ELECTRODE) ×2 IMPLANT
PIN SAFETY STERILE (MISCELLANEOUS) ×3 IMPLANT
PLASMABLADE 3.0S (MISCELLANEOUS) ×2
SET ASEPTIC TRANSFER (MISCELLANEOUS) ×1 IMPLANT
SLEEVE SCD COMPRESS KNEE MED (MISCELLANEOUS) ×2 IMPLANT
SPONGE LAP 18X18 X RAY DECT (DISPOSABLE) ×5 IMPLANT
SUT ETHILON 2 0 FS 18 (SUTURE) IMPLANT
SUT MNCRL AB 4-0 PS2 18 (SUTURE) ×3 IMPLANT
SUT MON AB 3-0 SH 27 (SUTURE) ×4
SUT MON AB 3-0 SH27 (SUTURE) ×1 IMPLANT
SUT MON AB 5-0 PS2 18 (SUTURE) ×3 IMPLANT
SUT PDS 3-0 CT2 (SUTURE)
SUT PDS AB 2-0 CT2 27 (SUTURE) ×4 IMPLANT
SUT PDS II 3-0 CT2 27 ABS (SUTURE) IMPLANT
SUT SILK 2 0 SH (SUTURE) IMPLANT
SUT SILK 3 0 PS 1 (SUTURE) ×3 IMPLANT
SUT VIC AB 3-0 SH 27 (SUTURE)
SUT VIC AB 3-0 SH 27X BRD (SUTURE) IMPLANT
SUT VICRYL 3-0 CR8 SH (SUTURE) ×1 IMPLANT
SYR BULB IRRIGATION 50ML (SYRINGE) ×2 IMPLANT
SYR CONTROL 10ML LL (SYRINGE) ×1 IMPLANT
TOWEL OR 17X24 6PK STRL BLUE (TOWEL DISPOSABLE) ×5 IMPLANT
TOWEL OR NON WOVEN STRL DISP B (DISPOSABLE) ×2 IMPLANT
TRAY FOLEY BAG SILVER LF 14FR (SET/KITS/TRAYS/PACK) ×1 IMPLANT
TUBE CONNECTING 20X1/4 (TUBING) ×2 IMPLANT
UNDERPAD 30X30 (UNDERPADS AND DIAPERS) ×4 IMPLANT
YANKAUER SUCT BULB TIP NO VENT (SUCTIONS) ×2 IMPLANT

## 2017-06-22 NOTE — Progress Notes (Signed)
Nuc med staff performed nuc med inj. Additional fentanyl given for comfort. Pt tol well. Will call friend to bedside and update/ provide emotional support.

## 2017-06-22 NOTE — Anesthesia Procedure Notes (Signed)
Anesthesia Procedure Image    

## 2017-06-22 NOTE — H&P (Signed)
[name]  Location: [facility] Surgery Patient #: [id number] DOB: Jul 28, 1950 Single / Language: Cleophus Molt / Race: Black or African American Female   History of Present Illness  The patient is a 67 year old female who presents with breast cancer. We are asked to see the patient in consultation by Dr. Melanee Spry to evaluate her for a new left breast cancer. The patient is a 67 year old black female who has a history of right breast cancer about 14 years ago for which she received radiation and chemotherapy. She recently went for a 6 month follow-up of some calcifications in the left breast. The area of calcification measured 5 cm in the upper outer quadrant. 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 She does have a history of breast cancer in her mother and liver cancer in her father.   Past Surgical History  Appendectomy  Breast Biopsy  Bilateral. Breast Mass; Local Excision  Right. Cesarean Section - 1  Knee Surgery  Bilateral. Thyroid Surgery   Diagnostic Studies History Colonoscopy  within last year Mammogram  within last year Pap Smear  1-5 years ago  Allergies  No Known Drug Allergies Allergies Reconciled   Medication History  Olmesartan-Amlodipine-HCTZ (20-5-12.5MG  Tablet, Oral) Active. Synthroid (88MCG Tablet, Oral) Active. Medications Reconciled  Social History  Alcohol use  Occasional alcohol use. Caffeine use  Carbonated beverages, Coffee, Tea. Illicit drug use  Remotely quit drug use. Tobacco use  Never smoker.  Family History  Arthritis  Mother. Breast Cancer  Mother. Cancer  Father. Heart Disease  Sister. Heart disease in female family member before age 27  Hypertension  Mother, Sister.  Pregnancy / Birth History  Age at menarche  46 years. Age of menopause  32-60 Contraceptive History  Oral contraceptives. Gravida  3 Maternal age  24-25 Para  1  Other Problems   Back Pain  Breast Cancer  High blood pressure  Thyroid Cancer  Thyroid Disease     Review of Systems  General Not Present- Appetite Loss, Chills, Fatigue, Fever, Night Sweats, Weight Gain and Weight Loss. Skin Not Present- Change in Wart/Mole, Dryness, Hives, Jaundice, New Lesions, Non-Healing Wounds, Rash and Ulcer. HEENT Not Present- Earache, Hearing Loss, Hoarseness, Nose Bleed, Oral Ulcers, Ringing in the Ears, Seasonal Allergies, Sinus Pain, Sore Throat, Visual Disturbances, Wears glasses/contact lenses and Yellow Eyes. Respiratory Not Present- Bloody sputum, Chronic Cough, Difficulty Breathing, Snoring and Wheezing. Breast Not Present- Breast Mass, Breast Pain, Nipple Discharge and Skin Changes. Cardiovascular Present- Leg Cramps. Not Present- Chest Pain, Difficulty Breathing Lying Down, Palpitations, Rapid Heart Rate, Shortness of Breath and Swelling of Extremities. Gastrointestinal Not Present- Abdominal Pain, Bloating, Bloody Stool, Change in Bowel Habits, Chronic diarrhea, Constipation, Difficulty Swallowing, Excessive gas, Gets full quickly at meals, Hemorrhoids, Indigestion, Nausea, Rectal Pain and Vomiting. Female Genitourinary Not Present- Frequency, Nocturia, Painful Urination, Pelvic Pain and Urgency. Musculoskeletal Present- Back Pain. Not Present- Joint Pain, Joint Stiffness, Muscle Pain, Muscle Weakness and Swelling of Extremities. Neurological Not Present- Decreased Memory, Fainting, Headaches, Numbness, Seizures, Tingling, Tremor, Trouble walking and Weakness. Psychiatric Not Present- Anxiety, Bipolar, Change in Sleep Pattern, Depression, Fearful and Frequent crying. Endocrine Not Present- Cold Intolerance, Excessive Hunger, Hair Changes, Heat Intolerance, Hot flashes and New Diabetes. Hematology Not Present- Blood Thinners, Easy Bruising, Excessive bleeding, Gland problems, HIV and Persistent Infections.  Vitals  Weight: 220 lb Height: 64in Body Surface Area:  2.04 m Body Mass Index: 37.76 kg/m  Temp.: 97.36F  Pulse: 97 (Regular)  BP: 118/84 (Sitting, Left Arm, Standard)       Physical Exam  General Mental Status-Alert. General Appearance-Consistent with stated age. Hydration-Well hydrated. Voice-Normal.  Head and Neck Head-normocephalic, atraumatic with no lesions or palpable masses. Trachea-midline. Thyroid Gland Characteristics - normal size and consistency.  Eye Eyeball - Bilateral-Extraocular movements intact. Sclera/Conjunctiva - Bilateral-No scleral icterus.  Chest and Lung Exam Chest and lung exam reveals -quiet, even and easy respiratory effort with no use of accessory muscles and on auscultation, normal breath sounds, no adventitious sounds and normal vocal resonance. Inspection Chest Wall - Normal. Back - normal.  Breast Note: There is no palpable mass in either breast. There is no palpable axillary, supraclavicular, or cervical lymphadenopathy. There is a well-healed scar in the upper inner right breast   Cardiovascular Cardiovascular examination reveals -normal heart sounds, regular rate and rhythm with no murmurs and normal pedal pulses bilaterally.  Abdomen Inspection Inspection of the abdomen reveals - No Hernias. Skin - Scar - no surgical scars. Palpation/Percussion Palpation and Percussion of the abdomen reveal - Soft, Non Tender, No Rebound tenderness, No Rigidity (guarding) and No hepatosplenomegaly. Auscultation Auscultation of the abdomen reveals - Bowel sounds normal.  Neurologic Neurologic evaluation reveals -alert and oriented x 3 with no impairment of recent or remote memory. Mental Status-Normal.  Musculoskeletal Normal Exam - Left-Upper Extremity Strength Normal and Lower Extremity Strength Normal. Normal Exam - Right-Upper Extremity Strength Normal and Lower Extremity Strength Normal.  Lymphatic Head & Neck  General Head & Neck Lymphatics: Bilateral  - Description - Normal. Axillary  General Axillary Region: Bilateral - Description - Normal. Tenderness - Non Tender. Femoral & Inguinal  Generalized Femoral & Inguinal Lymphatics: Bilateral - Description - Normal. Tenderness - Non Tender.    Assessment & Plan  DUCTAL CARCINOMA IN SITU (DCIS) OF LEFT BREAST (D05.12) Impression: The patient appears to have a large area of ductal carcinoma in situ in the upper outer left breast. Since she does have a large breast size that I believe there is still an opportunity for breast conservation versus mastectomy. Because of her history of thing she would be a good candidate for genetic testing. I will refer her for genetic testing and an oncology consult. I will also refer her to plastic surgery to talk about potential reduction lumpectomy for reconstruction if she were to choose mastectomy. I will talk to her again once these evaluations are done so that we can make a plan for definitive surgery. Current Plans Referred to Genetic Counseling, for evaluation and follow up (Medical Genetics). Routine. Referred to Oncology, for evaluation and follow up (Oncology). Routine. Referred to Surgery - Plastic, for evaluation and follow up (Plastic Surgery). Routine. Follow-up after evaluation by consultant  She has elected for left mastectomy and sentinel node mapping and right prophylactic mastectomy with reconstruction

## 2017-06-22 NOTE — Progress Notes (Signed)
Assisted Dr. Rose with left, ultrasound guided, pectoralis block. Side rails up, monitors on throughout procedure. See vital signs in flow sheet. Tolerated Procedure well. 

## 2017-06-22 NOTE — Anesthesia Postprocedure Evaluation (Signed)
Anesthesia Post Note  Patient: [name]  Procedure(s) Performed: LEFT MASTECTOMY WITH SENTINEL LYMPH NODE BIOPSY AND RIGHT PROPHYLACTIC MASTECTOMY (Bilateral Breast) IMMEDIATE BILATERAL BREAST RECONSTRUCTION WITH PLACEMENT OF TISSUE EXPANDER AND FLEX HD (ACELLULAR HYDRATED DERMIS) (Bilateral Breast)     Patient location during evaluation: PACU Anesthesia Type: General Level of consciousness: awake and alert Pain management: pain level controlled Vital Signs Assessment: post-procedure vital signs reviewed and stable Respiratory status: spontaneous breathing, nonlabored ventilation, respiratory function stable and patient connected to nasal cannula oxygen Cardiovascular status: blood pressure returned to baseline and stable Postop Assessment: no apparent nausea or vomiting Anesthetic complications: no    Last Vitals:  Vitals:   06/22/17 1345 06/22/17 1400  BP: (!) 135/56 138/75  Pulse: 91 90  Resp: (!) 22 16  Temp:    SpO2: 93% 92%    Last Pain:  Vitals:   06/22/17 1345  TempSrc:   PainSc: Asleep                 Kolbie Lepkowski S

## 2017-06-22 NOTE — Transfer of Care (Signed)
Immediate Anesthesia Transfer of Care Note  Patient: [name]  Procedure(s) Performed: Procedure(s) (LRB): LEFT MASTECTOMY WITH SENTINEL LYMPH NODE BIOPSY AND RIGHT PROPHYLACTIC MASTECTOMY (Bilateral) IMMEDIATE BILATERAL BREAST RECONSTRUCTION WITH PLACEMENT OF TISSUE EXPANDER AND FLEX HD (ACELLULAR HYDRATED DERMIS) (Bilateral)  Patient Location: PACU  Anesthesia Type: General  Level of Consciousness: awake, sedated, patient cooperative and responds to stimulation  Airway & Oxygen Therapy: Patient Spontanous Breathing and Patient connected to face mask oxygen  Post-op Assessment: Report given to PACU RN, Post -op Vital signs reviewed and stable and Patient moving all extremities  Post vital signs: Reviewed and stable  Complications: No apparent anesthesia complications

## 2017-06-22 NOTE — Anesthesia Procedure Notes (Signed)
Procedure Name: Intubation Date/Time: 06/22/2017 9:18 AM Performed by: Justice Rocher, CRNA Pre-anesthesia Checklist: Patient identified, Emergency Drugs available, Suction available and Patient being monitored Patient Re-evaluated:Patient Re-evaluated prior to induction Oxygen Delivery Method: Circle system utilized Preoxygenation: Pre-oxygenation with 100% oxygen Induction Type: IV induction Ventilation: Mask ventilation without difficulty Laryngoscope Size: Mac and 4 Grade View: Grade II Tube type: Oral Tube size: 7.0 mm Number of attempts: 1 Airway Equipment and Method: Stylet and Oral airway Placement Confirmation: ETT inserted through vocal cords under direct vision,  positive ETCO2 and breath sounds checked- equal and bilateral Secured at: 23 cm Tube secured with: Tape Dental Injury: Teeth and Oropharynx as per pre-operative assessment

## 2017-06-22 NOTE — Anesthesia Preprocedure Evaluation (Signed)
Anesthesia Evaluation  Patient identified by MRN, date of birth, ID band Patient awake    Reviewed: Allergy & Precautions, NPO status , Patient's Chart, lab work & pertinent test results  Airway Mallampati: II  TM Distance: >3 FB Neck ROM: Full    Dental no notable dental hx.    Pulmonary sleep apnea ,    Pulmonary exam normal breath sounds clear to auscultation       Cardiovascular hypertension, Normal cardiovascular exam Rhythm:Regular Rate:Normal     Neuro/Psych negative neurological ROS  negative psych ROS   GI/Hepatic negative GI ROS, Neg liver ROS,   Endo/Other  Hypothyroidism   Renal/GU negative Renal ROS  negative genitourinary   Musculoskeletal negative musculoskeletal ROS (+)   Abdominal   Peds negative pediatric ROS (+)  Hematology negative hematology ROS (+)   Anesthesia Other Findings   Reproductive/Obstetrics negative OB ROS                             Anesthesia Physical Anesthesia Plan  ASA: II  Anesthesia Plan: General   Post-op Pain Management:  Regional for Post-op pain   Induction: Intravenous  PONV Risk Score and Plan: 3 and Ondansetron, Dexamethasone, Scopolamine patch - Pre-op, Midazolam and Treatment may vary due to age or medical condition  Airway Management Planned: LMA and Oral ETT  Additional Equipment:   Intra-op Plan:   Post-operative Plan: Extubation in OR  Informed Consent: I have reviewed the patients History and Physical, chart, labs and discussed the procedure including the risks, benefits and alternatives for the proposed anesthesia with the patient or authorized representative who has indicated his/her understanding and acceptance.   Dental advisory given  Plan Discussed with: CRNA and Surgeon  Anesthesia Plan Comments:         Anesthesia Quick Evaluation

## 2017-06-22 NOTE — Op Note (Signed)
06/22/2017  11:08 AM  PATIENT:  [name]  67 y.o. female  PRE-OPERATIVE DIAGNOSIS:  LEFT BREAST DCIS  POST-OPERATIVE DIAGNOSIS:  LEFT BREAST DCIS  PROCEDURE:  Procedure(s): LEFT MASTECTOMY WITH DEEP LEFT AXILLARY SENTINEL LYMPH NODE BIOPSY AND RIGHT PROPHYLACTIC MASTECTOMY (Bilateral)  SURGEON:  Surgeon(s) and Role: Panel 1:    * Jovita Kussmaul, MD - Primary  PHYSICIAN ASSISTANT:   ASSISTANTS: none   ANESTHESIA:   general  EBL:  minimal   BLOOD ADMINISTERED:none  DRAINS: none   LOCAL MEDICATIONS USED:  NONE  SPECIMEN:  Source of Specimen:  Left mastectomy and sentinel node and right mastectomy  DISPOSITION OF SPECIMEN:  PATHOLOGY  COUNTS:  YES  TOURNIQUET:  * No tourniquets in log *  DICTATION: .Dragon Dictation   After informed consent was obtained the patient was brought to the operating room and placed in the supine position on the operating table.  After adequate induction of general anesthesia the patient's bilateral chest, breast, and axillary areas were prepped with ChloraPrep, allowed to dry, and draped in usual sterile manner.  An appropriate timeout was performed.  Attention was first turned to the right breast.  An elliptical incision was made around the nipple and areole a complex in order to include her previous scar but try to limit how much skin we took.  The incision was carried through the skin and subcutaneous tissue sharply with the plasma blade.  Breast hooks were then used to elevate the skin flaps anteriorly towards the ceiling.  Thin skin flaps were then created circumferentially between the breast tissue in the subcutaneous fat.  This dissection was carried all the way to the chest wall.  Next the breast was removed from the pectoralis muscle with the pectoralis fascia.  Once this was accomplished the breast was removed from the patient.  The breast was marked with a stitch on the lateral skin and sent to pathology for further evaluation.   Hemostasis was achieved using the plasma blade.  The wound was irrigated with saline and packed with a moistened lap sponge.  Attention was then turned to the left breast.  Earlier in the day the patient underwent injection of 1 mCi of technetium sulfur colloid in the subareolar position on the left.  An elliptical incision was made around the nipple and areole a complex in order to minimize the amount of skin removed.  The incision was carried through the skin and subcutaneous tissue sharply with the plasma blade.  Breast hooks were used to elevate the skin flaps anteriorly towards the ceiling.  Thin skin flaps were then created circumferentially by dissecting between the breast tissue in the subcutaneous fat.  This dissection was carried all the way to the chest wall circumferentially.  Next the breast was removed from the pectoralis muscle with the pectoralis fascia.  When the dissection reached the left axilla the neoprobe was used to direct sharp dissection with the plasma blade.  I was able to identify a hot lymph node.  This lymph node was excised sharply with the plasma blade and sent to pathology for further evaluation.  Ex vivo counts on this node were approximately 3000.  No other hot or palpable lymph nodes were identified in the left axilla.  The left breast was marked with a stitch on the lateral skin and sent to pathology for further evaluation as well.  Hemostasis was achieved using the plasma blade.  The wound was irrigated with saline and packed with a  moistened lap sponge.  The patient tolerated the procedure well.  At the end of the case all needle sponge and instrument counts were correct.  At this point the operation was turned over to Dr. Marla Roe for the reconstruction.  Her portion of the case will be dictated separately.  PLAN OF CARE: Admit for overnight observation  PATIENT DISPOSITION:  PACU - hemodynamically stable.   Delay start of Pharmacological VTE agent (>24hrs) due to  surgical blood loss or risk of bleeding: no

## 2017-06-22 NOTE — Interval H&P Note (Signed)
History and Physical Interval Note:  06/22/2017 9:16 AM  [name]  has presented today for surgery, with the diagnosis of LEFT BREAST DCIS  The various methods of treatment have been discussed with the patient and family. After consideration of risks, benefits and other options for treatment, the patient has consented to  Procedure(s): LEFT MASTECTOMY WITH SENTINEL LYMPH NODE BIOPSY AND RIGHT PROPHYLACTIC MASTECTOMY (Bilateral) IMMEDIATE BILATERAL BREAST RECONSTRUCTION WITH PLACEMENT OF TISSUE EXPANDER AND FLEX HD (ACELLULAR HYDRATED DERMIS) (Bilateral) as a surgical intervention .  The patient's history has been reviewed, patient examined, no change in status, stable for surgery.  I have reviewed the patient's chart and labs.  Questions were answered to the patient's satisfaction.     [name] Dillingham

## 2017-06-22 NOTE — Op Note (Signed)
Op report    DATE OF OPERATION:  06/22/2017  LOCATION: [facility]  SURGICAL DIVISION: Plastic Surgery  PREOPERATIVE DIAGNOSES:  1. Left Breast cancer.   2. History of right breast cancer.  POSTOPERATIVE DIAGNOSES:  1. Left Breast cancer.  2. History of right breast cancer.  PROCEDURE:  1. Bilateral immediate breast reconstruction with placement of Acellular Dermal Matrix and tissue expanders.  SURGEON: Zayden Hahne Sanger Candis Kabel, DO  ASSISTANT: Shawn Rayburn, PA  ANESTHESIA:  General.   COMPLICATIONS: None.   IMPLANTS: Left - Mentor [id number], 475 cc. Serial Number [id number], 150 cc of saline was placed Right - Mentor [id number], 474cc.  Serial Number [id number], 150 cc of saline was placed Acellular Dermal Matrix 4 x 16 cm two  INDICATIONS FOR PROCEDURE:  The patient, [name], is a 67 y.o. female born on 11-24-1950, is here for  immediate first stage breast reconstruction with placement of bilateral tissue expander and Acellular dermal matrix. She had right breast cancer in the past and received radiation. MRN: [id number]  CONSENT:  Informed consent was obtained directly from the patient. Risks, benefits and alternatives were fully discussed. Specific risks including but not limited to bleeding, infection, hematoma, seroma, scarring, pain, implant infection, implant extrusion, capsular contracture, asymmetry, wound healing problems, and need for further surgery were all discussed. The patient did have an ample opportunity to have her questions answered to her satisfaction.   DESCRIPTION OF PROCEDURE:  The patient was taken to the operating room by the general surgery team. SCDs were placed and IV antibiotics were given. The patient's chest was prepped and draped in a sterile fashion. A time out was performed and the implants to be used were identified.  Bilateral mastectomies were performed.  Once the general surgery team had completed their  portion of the case the patient was rendered to the plastic and reconstructive surgery team.  Right: The pectoralis major muscle was lifted from the chest wall with release of the lateral edge and lateral inframammary fold.  The pocket was irrigated with antibiotic solution and hemostasis was achieved with electrocautery.  The ADM was then prepared according to the manufacture guidelines and slits placed to help with postoperative fluid management.  The ADM was then sutured to the inferior and lateral edge of the inframammary fold with 2-0 PDS starting with an interrupted stitch and then a running stitch.  The lateral portion was sutured to with interrupted sutures after the expander was placed.  The expander was prepared according to the manufacture guidelines, the air evacuated and then it was placed under the ADM and pectoralis major muscle.  The inferior and lateral tabs were used to secure the expander to the chest wall with 2-0 PDS.  The drain was placed at the inframammary fold over the ADM and secured to the skin with 3-0 Silk.  The deep layers were closed with 3-0 Monocryl followed by 4-0 Monocryl.  The skin was closed with 5-0 Monocryl and then dermabond was applied.    Left:The pectoralis major muscle was lifted from the chest wall with release of the lateral edge and lateral inframammary fold.  The pocket was irrigated with antibiotic solution and hemostasis was achieved with electrocautery.  The ADM was then prepared according to the manufacture guidelines and slits placed to help with postoperative fluid management.  The ADM was then sutured to the inferior and lateral edge of the inframammary fold with 2-0 PDS starting with an interrupted stitch and then a running stitch.  The lateral portion was sutured to with interrupted sutures after the expander was placed.  The expander was prepared according to the manufacture guidelines, the air evacuated and then it was placed under the ADM and pectoralis  major muscle.  The inferior and lateral tabs were used to secure the expander to the chest wall with 2-0 PDS.  The drain was placed at the inframammary fold over the ADM and secured to the skin with 3-0 Silk.   The deep layers were closed with 3-0 Monocryl followed by 4-0 Monocryl.  The skin was closed with 5-0 Monocryl and then dermabond was applied.    The ABDs and breast binder were placed.  The patient tolerated the procedure well and there were no complications.  The patient was allowed to wake from anesthesia and taken to the recovery room in satisfactory condition.

## 2017-06-22 NOTE — Interval H&P Note (Signed)
History and Physical Interval Note:  06/22/2017 8:22 AM  [name]  has presented today for surgery, with the diagnosis of LEFT BREAST DCIS  The various methods of treatment have been discussed with the patient and family. After consideration of risks, benefits and other options for treatment, the patient has consented to  Procedure(s): LEFT MASTECTOMY WITH SENTINEL LYMPH NODE BIOPSY AND RIGHT PROPHYLACTIC MASTECTOMY (Bilateral) IMMEDIATE BILATERAL BREAST RECONSTRUCTION WITH PLACEMENT OF TISSUE EXPANDER AND FLEX HD (ACELLULAR HYDRATED DERMIS) (Bilateral) as a surgical intervention .  The patient's history has been reviewed, patient examined, no change in status, stable for surgery.  I have reviewed the patient's chart and labs.  Questions were answered to the patient's satisfaction.     TOTH III,Neva Ramaswamy S

## 2017-06-22 NOTE — Anesthesia Procedure Notes (Signed)
Anesthesia Regional Block: Pectoralis block   Pre-Anesthetic Checklist: ,, timeout performed, Correct Patient, Correct Site, Correct Laterality, Correct Procedure, Correct Position, site marked, Risks and benefits discussed,  Surgical consent,  Pre-op evaluation,  At surgeon's request and post-op pain management  Laterality: Left  Prep: chloraprep       Needles:  Injection technique: Single-shot  Needle Type: Echogenic Needle     Needle Length: 9cm      Additional Needles:   Procedures:,,,, ultrasound used (permanent image in chart),,,,  Narrative:  Start time: 06/22/2017 7:50 AM End time: 06/22/2017 8:01 AM Injection made incrementally with aspirations every 5 mL.  Performed by: Personally  Anesthesiologist: Myrtie Soman, MD  Additional Notes: Patient tolerated the procedure well without complications

## 2017-06-23 ENCOUNTER — Encounter (HOSPITAL_BASED_OUTPATIENT_CLINIC_OR_DEPARTMENT_OTHER): Payer: Self-pay | Admitting: General Surgery

## 2017-06-23 DIAGNOSIS — I10 Essential (primary) hypertension: Secondary | ICD-10-CM | POA: Diagnosis not present

## 2017-06-23 DIAGNOSIS — E039 Hypothyroidism, unspecified: Secondary | ICD-10-CM | POA: Diagnosis not present

## 2017-06-23 DIAGNOSIS — G4730 Sleep apnea, unspecified: Secondary | ICD-10-CM | POA: Diagnosis not present

## 2017-06-23 DIAGNOSIS — D0512 Intraductal carcinoma in situ of left breast: Secondary | ICD-10-CM | POA: Diagnosis not present

## 2017-06-23 DIAGNOSIS — Z853 Personal history of malignant neoplasm of breast: Secondary | ICD-10-CM | POA: Diagnosis not present

## 2017-06-23 DIAGNOSIS — N6031 Fibrosclerosis of right breast: Secondary | ICD-10-CM | POA: Diagnosis not present

## 2017-06-23 MED ORDER — HYDROCODONE-ACETAMINOPHEN 5-325 MG PO TABS: 1.0000 | ORAL_TABLET | ORAL | 0 refills | Status: DC | PRN

## 2017-06-23 MED ORDER — DIAZEPAM 2 MG PO TABS: 2.0000 mg | ORAL_TABLET | Freq: Two times a day (BID) | ORAL | 0 refills | Status: DC | PRN

## 2017-06-23 MED ORDER — ONDANSETRON 4 MG PO TBDP: 4.0000 mg | ORAL_TABLET | Freq: Four times a day (QID) | ORAL | 0 refills | Status: DC | PRN

## 2017-06-23 MED ORDER — SENNA 8.6 MG PO TABS: 1.0000 | ORAL_TABLET | Freq: Two times a day (BID) | ORAL | 0 refills | Status: DC

## 2017-06-23 NOTE — Discharge Instructions (Signed)

## 2017-06-23 NOTE — Addendum Note (Signed)
Addendum  created 06/23/17 1131 by Tawni Millers, CRNA   Charge Capture section accepted

## 2017-06-29 ENCOUNTER — Inpatient Hospital Stay: Payer: Medicare Other | Attending: Hematology and Oncology | Admitting: Hematology and Oncology

## 2017-06-29 ENCOUNTER — Telehealth: Payer: Self-pay | Admitting: Hematology and Oncology

## 2017-06-29 DIAGNOSIS — Z170 Estrogen receptor positive status [ER+]: Secondary | ICD-10-CM | POA: Insufficient documentation

## 2017-06-29 DIAGNOSIS — Z85850 Personal history of malignant neoplasm of thyroid: Secondary | ICD-10-CM | POA: Diagnosis not present

## 2017-06-29 DIAGNOSIS — D0512 Intraductal carcinoma in situ of left breast: Secondary | ICD-10-CM | POA: Diagnosis not present

## 2017-06-29 NOTE — Progress Notes (Signed)
Patient Care Team: Yvonne Lei, MD as PCP - General (Family Medicine)  DIAGNOSIS:  Encounter Diagnosis  Name Primary?  . Ductal carcinoma in situ (DCIS) of left breast     SUMMARY OF ONCOLOGIC HISTORY:   Thyroid cancer ([place])   04/28/2010 Initial Diagnosis    Thyroid cancer ([place])      04/29/2017 Genetic Testing    STAT Breast panel with reflex to Multi-Cancer panel (83 genes) @ [facility] - No pathogenic mutations detected Variant of Uncertain Significance in MUTYH  Genes Analyzed: 83 genes on [facility]'s Multi-Cancer panel (ALK, APC, ATM, AXIN2, BAP1, BARD1, BLM, BMPR1A, BRCA1, BRCA2, BRIP1, CASR, CDC73, CDH1, CDK4, CDKN1B, CDKN1C, CDKN2A, CEBPA, CHEK2, CTNNA1, DICER1, DIS3L2, EGFR, EPCAM, FH, FLCN, GATA2, GPC3, GREM1, HOXB13, HRAS, KIT, MAX, MEN1, MET, MITF, MLH1, MSH2, MSH3, MSH6, MUTYH, NBN, NF1, NF2, NTHL1, PALB2, PDGFRA, PHOX2B, PMS2, POLD1, POLE, POT1, PRKAR1A, PTCH1, PTEN, RAD50, RAD51C, RAD51D, RB1, RECQL4, RET, RUNX1, SDHA, SDHAF2, SDHB, SDHC, SDHD, SMAD4, SMARCA4, SMARCB1, SMARCE1, STK11, SUFU, TERC, TERT, TMEM127, TP53, TSC1, TSC2, VHL, WRN, WT1).       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      04/09/2017 Initial Diagnosis    Multiple grouped pleomorphic and coarse heterogeneous calcifications spanning 5 cm. Two biopsies revealed DCIS with necrosis ER 100%, PR 100% on the first biopsy, ER 95%, PR 90% on second biopsy, Tis Nx stage 0      04/29/2017 Genetic Testing    STAT Breast panel with reflex to Multi-Cancer panel (83 genes) @ [facility] - No pathogenic mutations detected Variant of Uncertain Significance in MUTYH  Genes Analyzed: 83 genes on [facility]'s Multi-Cancer panel (ALK, APC, ATM, AXIN2, BAP1, BARD1, BLM, BMPR1A, BRCA1, BRCA2, BRIP1, CASR, CDC73, CDH1, CDK4,  CDKN1B, CDKN1C, CDKN2A, CEBPA, CHEK2, CTNNA1, DICER1, DIS3L2, EGFR, EPCAM, FH, FLCN, GATA2, GPC3, GREM1, HOXB13, HRAS, KIT, MAX, MEN1, MET, MITF, MLH1, MSH2, MSH3, MSH6, MUTYH, NBN, NF1, NF2, NTHL1, PALB2, PDGFRA, PHOX2B, PMS2, POLD1, POLE, POT1, PRKAR1A, PTCH1, PTEN, RAD50, RAD51C, RAD51D, RB1, RECQL4, RET, RUNX1, SDHA, SDHAF2, SDHB, SDHC, SDHD, SMAD4, SMARCA4, SMARCB1, SMARCE1, STK11, SUFU, TERC, TERT, TMEM127, TP53, TSC1, TSC2, VHL, WRN, WT1).      06/22/2017 Surgery    Left mastectomy: DCIS intermediate grade, 0/2 lymph nodes negative, ER 95%, PR 90%, Tis N0 stage 0 right mastectomy: Benign       CHIEF COMPLIANT: Follow-up of DCIS status post mastectomy  INTERVAL HISTORY: [name] is a 66-year with above-mentioned history of left breast DCIS. She did very well from the surgery.  Does have the drains.  Pain is under reasonable control.  REVIEW OF SYSTEMS:   Constitutional: Denies fevers, chills or abnormal weight loss Eyes: Denies blurriness of vision Ears, nose, mouth, throat, and face: Denies mucositis or sore throat Respiratory: Denies cough, dyspnea or wheezes Cardiovascular: Denies palpitation, chest discomfort Gastrointestinal:  Denies nausea, heartburn or change in bowel habits Skin: Denies abnormal skin rashes Lymphatics: Denies new lymphadenopathy or easy bruising Neurological:Denies numbness, tingling or new weaknesses Behavioral/Psych: Mood is stable, no new changes  Extremities: No lower extremity edema Breast: Bilateral mastectomies undergoing reconstruction. All other systems were reviewed with the patient and are negative.  I have reviewed the past medical history, past surgical history, social history and family history with the patient and they are unchanged from  previous note.  ALLERGIES:  is allergic to penicillins.  MEDICATIONS:  Current Outpatient Medications  Medication Sig Dispense Refill  . cholecalciferol (VITAMIN D) 1000 units tablet Take 1,000  Units by mouth daily.    . diazepam (VALIUM) 2 MG tablet Take 1 tablet (2 mg total) by mouth every 12 (twelve) hours as needed for muscle spasms. 30 tablet 0  . HYDROcodone-acetaminophen (NORCO/VICODIN) 5-325 MG tablet Take 1-2 tablets by mouth every 4 (four) hours as needed for moderate pain. 30 tablet 0  . levothyroxine (SYNTHROID, LEVOTHROID) 88 MCG tablet Take 88 mcg by mouth daily before breakfast.    . Olmesartan-Amlodipine-HCTZ 20-5-12.5 MG TABS Take by mouth daily.    . ondansetron (ZOFRAN-ODT) 4 MG disintegrating tablet Take 1 tablet (4 mg total) by mouth every 6 (six) hours as needed for nausea. 20 tablet 0  . senna (SENOKOT) 8.6 MG TABS tablet Take 1 tablet (8.6 mg total) by mouth 2 (two) times daily. 120 each 0  . tamoxifen (NOLVADEX) 20 MG tablet Take 1 tablet (20 mg total) by mouth daily. 90 tablet 3   No current facility-administered medications for this visit.     PHYSICAL EXAMINATION: ECOG PERFORMANCE STATUS: 2 - Symptomatic, <50% confined to bed  Vitals:   06/29/17 1431  BP: 115/72  Pulse: 87  Resp: 18  Temp: 98 F (36.7 C)  SpO2: 98%   Filed Weights   06/29/17 1431  Weight: 214 lb 1.6 oz (97.1 kg)    GENERAL:alert, no distress and comfortable SKIN: skin color, texture, turgor are normal, no rashes or significant lesions EYES: normal, Conjunctiva are pink and non-injected, sclera clear OROPHARYNX:no exudate, no erythema and lips, buccal mucosa, and tongue normal  NECK: supple, thyroid normal size, non-tender, without nodularity LYMPH:  no palpable lymphadenopathy in the cervical, axillary or inguinal LUNGS: clear to auscultation and percussion with normal breathing effort HEART: regular rate & rhythm and no murmurs and no lower extremity edema ABDOMEN:abdomen soft, non-tender and normal bowel sounds MUSCULOSKELETAL:no cyanosis of digits and no clubbing  NEURO: alert & oriented x 3 with fluent speech, no focal motor/sensory deficits EXTREMITIES: No lower  extremity edema  LABORATORY DATA:  I have reviewed the data as listed CMP Latest Ref Rng & Units 06/17/2017 06/27/2013 04/02/2013  Glucose 65 - 99 mg/dL 97 108 99  BUN 6 - 20 mg/dL 12 12.5 13  Creatinine 0.44 - 1.00 mg/dL 0.94 0.8 0.84  Sodium 135 - 145 mmol/L 141 144 142  Potassium 3.5 - 5.1 mmol/L 3.4(L) 3.5 3.2(L)  Chloride 101 - 111 mmol/L 105 - 106  CO2 22 - 32 mmol/L 26 30(H) 29  Calcium 8.9 - 10.3 mg/dL 8.7(L) 9.8 8.1(L)  Total Protein 6.4 - 8.3 g/dL - 7.4 -  Total Bilirubin 0.20 - 1.20 mg/dL - 0.38 -  Alkaline Phos 40 - 150 U/L - 116 -  AST 5 - 34 U/L - 17 -  ALT 0 - 55 U/L - 15 -    Lab Results  Component Value Date   WBC 6.4 06/27/2013   HGB 12.1 06/27/2013   HCT 37.6 06/27/2013   MCV 86.5 06/27/2013   PLT 287 06/27/2013   NEUTROABS 4.1 06/27/2013    ASSESSMENT & PLAN:  Ductal carcinoma in situ (DCIS) of left breast 06/22/2017: Left mastectomy: DCIS intermediate grade, 0/2 lymph nodes negative, ER 95%, PR 90%, Tis N0 stage 0 right mastectomy: Benign  Pathology counseling: I discussed the final pathology report of the patient provided  a copy  of this report. I discussed the margins as well as lymph node surgeries. We also discussed the final staging along with previously performed ER/PR testing.  Recommendation: No role of adjuvant radiation on antiestrogen therapy since she had bilateral mastectomies.  Surveillance and follow-up by Dr. Marlou Starks. Follow-up with me in 1 year for follow-up.   I spent 25 minutes talking to the patient of which more than half was spent in counseling and coordination of care.  No orders of the defined types were placed in this encounter.  The patient has a good understanding of the overall plan. she agrees with it. she will call with any problems that may develop before the next visit here.   Harriette Ohara, MD 06/29/17

## 2017-06-29 NOTE — Telephone Encounter (Signed)
Gave avs and calendar for march 2020 °

## 2017-06-29 NOTE — Assessment & Plan Note (Signed)
06/22/2017: Left mastectomy: DCIS intermediate grade, 0/2 lymph nodes negative, ER 95%, PR 90%, Tis N0 stage 0 right mastectomy: Benign  Pathology counseling: I discussed the final pathology report of the patient provided  a copy of this report. I discussed the margins as well as lymph node surgeries. We also discussed the final staging along with previously performed ER/PR testing.  Recommendation: No role of adjuvant radiation on antiestrogen therapy since she had bilateral mastectomies.  Surveillance and follow-up by Dr. Marlou Starks. Follow-up with me on an as-needed basis.

## 2017-06-30 ENCOUNTER — Encounter: Payer: Self-pay | Admitting: *Deleted

## 2017-07-22 ENCOUNTER — Ambulatory Visit: Payer: Medicare Other

## 2017-07-28 ENCOUNTER — Ambulatory Visit: Payer: Medicare Other | Attending: Plastic Surgery | Admitting: Physical Therapy

## 2017-07-28 ENCOUNTER — Encounter: Payer: Self-pay | Admitting: Physical Therapy

## 2017-07-28 DIAGNOSIS — M25611 Stiffness of right shoulder, not elsewhere classified: Secondary | ICD-10-CM | POA: Diagnosis not present

## 2017-07-28 DIAGNOSIS — Z483 Aftercare following surgery for neoplasm: Secondary | ICD-10-CM | POA: Diagnosis not present

## 2017-07-28 DIAGNOSIS — M25511 Pain in right shoulder: Secondary | ICD-10-CM | POA: Diagnosis not present

## 2017-07-28 DIAGNOSIS — M25612 Stiffness of left shoulder, not elsewhere classified: Secondary | ICD-10-CM | POA: Diagnosis not present

## 2017-07-28 DIAGNOSIS — M6281 Muscle weakness (generalized): Secondary | ICD-10-CM | POA: Diagnosis not present

## 2017-07-28 DIAGNOSIS — M25512 Pain in left shoulder: Secondary | ICD-10-CM | POA: Insufficient documentation

## 2017-07-28 NOTE — Patient Instructions (Signed)

## 2017-07-28 NOTE — Therapy (Signed)
[place], [place], [address] Phone: [phone]   Fax:  [phone]  Physical Therapy Evaluation  Patient Details  Name: [name] MRN: [id number] Date of Birth: 08-04-1950 Referring Provider: Dr. Marla Roe    Encounter Date: 07/28/2017  PT End of Session - 07/28/17 0911    Visit Number  1    Number of Visits  9    Date for PT Re-Evaluation  08/27/17    Authorization Type  Medicare;  KX at visit 15    PT Start Time  0800    PT Stop Time  3790    PT Time Calculation (min)  47 min    Activity Tolerance  Patient tolerated treatment well    Behavior During Therapy  [facility] for tasks assessed/performed       Past Medical History:  Diagnosis Date  . Arthritis   . Breast cancer [facility]) 2004   right breast  . Breast cancer, left ([place])   . Bruises easily   . Cancer [facility]) 2004   rt breast;takes Arimidex daily;thyroid cancer  . Genetic testing 04/17/2017   STAT Breast panel with reflex to Multi-Cancer panel (83 genes) @ [facility] - No pathogenic mutations detected  . History of bronchitis    when she was young  . Hypertension    takes Tribenzor daily  . Hypothyroidism    takes Synthroid daily  . Joint pain   . Joint swelling   . Personal history of chemotherapy 04/2003  . Personal history of radiation therapy 2005  . Pneumonia    hx of when she was young  . Sleep apnea    has a machine but does not use  . Thyroid cancer ([place]) 2012  . Urinary frequency     Past Surgical History:  Procedure Laterality Date  . ANTERIOR CERVICAL DECOMP/DISCECTOMY FUSION  2008  . APPENDECTOMY  2007  . BREAST BIOPSY Right 02/28/2003   malignant  . BREAST LUMPECTOMY  2004  . BREAST RECONSTRUCTION WITH PLACEMENT OF TISSUE EXPANDER AND FLEX HD (ACELLULAR HYDRATED DERMIS) Bilateral 06/22/2017   Procedure: IMMEDIATE BILATERAL BREAST RECONSTRUCTION WITH PLACEMENT OF TISSUE EXPANDER AND FLEX HD (ACELLULAR HYDRATED  DERMIS);  Surgeon: Wallace Going, DO;  Location: [place];  Service: Plastics;  Laterality: Bilateral;  . CESAREAN SECTION  18yrs ago  . COLONOSCOPY    . COLONOSCOPY    . KNEE ARTHROSCOPY  2007  . KNEE ARTHROSCOPY  05/02/2011   Procedure: ARTHROSCOPY KNEE;  Surgeon: Johnn Hai;  Location: [place];  Service: Orthopedics;  Laterality: Right;  . MASTECTOMY W/ SENTINEL NODE BIOPSY Bilateral 06/22/2017   Procedure: LEFT MASTECTOMY WITH SENTINEL LYMPH NODE BIOPSY AND RIGHT PROPHYLACTIC MASTECTOMY;  Surgeon: Jovita Kussmaul, MD;  Location: [place];  Service: General;  Laterality: Bilateral;  . MENISCUS DEBRIDEMENT  05/02/2011   Procedure: DEBRIDEMENT OF MENISCUS;  Surgeon: Johnn Hai;  Location: [place];  Service: Orthopedics;  Laterality: Right;  . porta cath remove  2005  . PORTACATH PLACEMENT  2004  . portacath removal    . THYROID LOBECTOMY Right 09/22/2012   Dr Constance Holster  . THYROIDECTOMY N/A 09/22/2012   Procedure: RIGHT THYROID LOBECTOMY WITH FROZEN SECTION;  Surgeon: Izora Gala, MD;  Location: [place];  Service: ENT;  Laterality: N/A;  . THYROIDECTOMY Left 10/21/2012   Procedure: COMPLETION OF THYROIDECTOMY;  Surgeon: Izora Gala, MD;  Location: [place];  Service: ENT;  Laterality: Left;  . TOTAL KNEE ARTHROPLASTY  04/14/2012   Procedure: TOTAL KNEE ARTHROPLASTY;  Surgeon: Ninetta Lights, MD;  Location: [place];  Service: Orthopedics;  Laterality: Right;  RIGHT ARTHROPLASTY KNEE MEDIAL/LATERAL COMPARTMENTS WITH PATELLA RESURFACING  . TOTAL KNEE ARTHROPLASTY Left 03/30/2013   Procedure: TOTAL KNEE ARTHROPLASTY;  Surgeon: Ninetta Lights, MD;  Location: [place];  Service: Orthopedics;  Laterality: Left;    There were no vitals filed for this visit.   Subjective Assessment - 07/28/17 0808    Subjective  Pt had been having PT at [facility] for her left elbow and right arm and has also for low back     Pertinent History   67 yo right breast cancer with lumpectomy with chemo and radiation  with occurance  of DCIS in left breast in Dec. 2018.  She underwent bilateral mastectomy  06/22/2017 with removal of sentinel lymph node from left axilla.  She will not have to have chemo or radiation  She has immedicate expander placement at time of mastectomy.  She is undergoing periordic fills and will have implant exchange in a few months. not sure of time  Past history includes chronic pain and arthritis with left hand and right elbow and low back    Currently in Pain?  Yes she is not having pain from the surgery     Pain Score  7     Pain Location  Hand    Pain Orientation  Left    Pain Descriptors / Indicators  Discomfort    Pain Type  Chronic pain    Pain Radiating Towards  no radiation     Pain Onset  More than a month ago    Pain Frequency  Intermittent    Aggravating Factors   certain movement     Pain Relieving Factors  had "hand things that they have given me to wear" and they help          [facility] PT Assessment - 07/28/17 0001      Assessment   Medical Diagnosis  left breast cancer    Referring Provider  Dr. Marla Roe     Onset Date/Surgical Date  06/22/17    Hand Dominance  Right    Next MD Visit  as needed    Prior Therapy  earlier this year       Precautions   Precautions  -- don't lift anything heavy       Restrictions   Weight Bearing Restrictions  No      Balance Screen   Has the patient fallen in the past 6 months  No    Has the patient had a decrease in activity level because of a fear of falling?   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  She has fullness at left incision and expander appears to sit higher on chest. She reports there is a different sensation at left lateral incision near drain site. She will point this out to Dr. Marla Roe today  Pt is obese with generalized muscle atrophy    Focus on Therapeutic Outcomes (FOTO)   --    Quick DASH   45.45      Sensation   Light Touch  Not tested      Coordination   Gross Motor Movements are Fluid and Coordinated  No pt winces with pain with shoulder elevation       Posture/Postural Control   Posture/Postural Control  Postural limitations    Postural Limitations  Rounded Shoulders;Forward head;Increased lumbar lordosis    Posture Comments  possible curve of spine       AROM   Right Shoulder Flexion  130 Degrees    Right Shoulder ABduction  135 Degrees    Left Shoulder Flexion  131 Degrees pain in left anterior shoulder     Left Shoulder ABduction  118 Degrees    Lumbar Flexion  --    Lumbar Extension  --    Lumbar - Right Side Bend  --    Lumbar - Left Side Bend  --      Strength   Overall Strength  Deficits both shoulders limited by pain to 3-/5    Lumbar Flexion  --    Lumbar Extension  --      Flexibility   Soft Tissue Assessment /Muscle Length  --    Hamstrings  --      Palpation   Palpation comment  --      Slump test   Findings  --      Straight Leg Raise   Findings  --        LYMPHEDEMA/ONCOLOGY QUESTIONNAIRE - 07/28/17 0840      Type   Cancer Type  left and right       Surgeries   Mastectomy Date  06/22/17 bilateral with nodes from left side     Lumpectomy Date  04/29/13 right       Treatment   Active Chemotherapy Treatment  No      What other symptoms do you have   Are you Having  Heaviness or Tightness  No    Are you having Pain  Yes    Are you having pitting edema  No      Right Upper Extremity Lymphedema   15 cm Proximal to Olecranon Process  39.2 cm    10 cm Proximal to Olecranon Process  39 cm    Olecranon Process  31 cm    15 cm Proximal to Ulnar Styloid Process  32 cm    10 cm Proximal to Ulnar Styloid Process  29 cm    Just Proximal to Ulnar Styloid Process  17.5 cm    Across Hand at [employer]  20.5 cm    At [place] of 2nd Digit  6.5 cm      Left Upper Extremity Lymphedema   15 cm Proximal to Olecranon Process  39 cm    10 cm Proximal to Olecranon Process  39 cm    Olecranon Process  30 cm    15 cm Proximal to Ulnar Styloid Process  31 cm    10 cm Proximal to Ulnar Styloid Process  27.5 cm  Just Proximal to Ulnar Styloid Process  17 cm    Across Hand at [employer]  20 cm    At [place] of 2nd Digit  6.5 cm          Quick Dash - 07/28/17 0001    Open a tight or new jar  Mild difficulty    Do heavy household chores (wash walls, wash floors)  Moderate difficulty    Carry a shopping bag or briefcase  Mild difficulty    Wash your back  Moderate difficulty    Use a knife to cut food  No difficulty    Recreational activities in which you take some force or impact through your arm, shoulder, or hand (golf, hammering, tennis)  Mild difficulty    During the past week, to what extent has your arm, shoulder or hand problem interfered with your normal social activities with family, friends, neighbors, or groups?  Modererately    During the past week, to what extent has your arm, shoulder or hand problem limited your work or other regular daily activities  Quite a bit    Arm, shoulder, or hand pain.  Severe    Tingling (pins and needles) in your arm, shoulder, or hand  Severe    Difficulty Sleeping  Moderate difficulty    DASH Score  45.45 %           Objective measurements completed on examination: See above findings.      [place] Adult PT  Treatment/Exercise - 07/28/17 0001      Shoulder Exercises: Supine   Other Supine Exercises  supine dowel rod flexion, abduction, external and internal rotation, chicken wings and lower trunk rotation              PT Education - 07/28/17 0910    Education provided  Yes    Education Details  supine dowel rod shoulder exercise and lower trunk rotation     Person(s) Educated  Patient    Methods  Explanation;Demonstration;Handout    Comprehension  Verbalized understanding;Returned demonstration       PT Short Term Goals - 06/09/17 1127      PT SHORT TERM GOAL #1   Title  STGs=LTGs      PT SHORT TERM GOAL #2   Title  ...      PT SHORT TERM GOAL #3   Title  ...      PT SHORT TERM GOAL #4   Title  ...        PT Long Term Goals - 07/28/17 0929      PT LONG TERM GOAL #1   Title  Pt will verbalized lymphedema risk reductions     Time  4    Period  Weeks    Status  New      PT LONG TERM GOAL #2   Title  Pt will be independent in a home program for shoulder ROM and strength     Time  4    Period  Weeks    Status  New      PT LONG TERM GOAL #3   Title  Pt will have bilateral shoulder painfree active abduction of 140 degrees so that she can perform her ADLS easily     Time  4    Period  Weeks    Status  New      PT LONG TERM GOAL #4   Title  Pt will decrease Quick DASH score to <25 indicating  a funcitonal improvment in UE's     Baseline  45.45    Time  4    Period  Weeks    Status  New             Plan - 07/28/17 0912    Clinical Impression Statement  67 yo female who has had 3 visits of PT earlier this year for management of low back pain comes to this clinic for rehab after bilateral mastectomy with immediate reconstruction surgery.  She has pain,, limited range of motion and strength. She will not need futher cancer treatment  She does not show signs of extremity lymphedema at this time     History and Personal Factors relevant to plan of care:  lives  alone, previous chronic pain issues and treatment, HTN, TKR    Clinical Presentation  Stable    Clinical Presentation due to:  as above    Clinical Decision Making  Low    Rehab Potential  Good    Clinical Impairments Affecting Rehab Potential  ongoing expander fills     PT Frequency  2x / week    PT Duration  4 weeks    PT Treatment/Interventions  ADLs/Self Care Home Management;DME Instruction;Therapeutic activities;Therapeutic exercise;Orthotic Fit/Training;Patient/family education;Manual techniques;Manual lymph drainage;Compression bandaging;Taping;Passive range of motion;Scar mobilization    PT Next Visit Plan  Teach [place]ks decompression and progress HEP for ROM and scapular strengthening.  Make sure pt is signed up for ABC Class.  Teach Strength ABC prior to discharge     PT Home Exercise Plan  supine dowel rod series, lower trunk rotation     Consulted and Agree with Plan of Care  Patient       Patient will benefit from skilled therapeutic intervention in order to improve the following deficits and impairments:  Decreased knowledge of use of DME, Decreased skin integrity, Increased fascial restricitons, Pain, Postural dysfunction, Decreased scar mobility, Decreased activity tolerance, Decreased endurance, Decreased range of motion, Decreased strength, Impaired perceived functional ability, Obesity  Visit Diagnosis: Aftercare following surgery for neoplasm - Plan: PT plan of care cert/re-cert  Stiffness of left shoulder, not elsewhere classified - Plan: PT plan of care cert/re-cert  Stiffness of right shoulder, not elsewhere classified - Plan: PT plan of care cert/re-cert  Acute pain of left shoulder - Plan: PT plan of care cert/re-cert  Acute pain of right shoulder - Plan: PT plan of care cert/re-cert  Muscle weakness (generalized) - Plan: PT plan of care cert/re-cert     Problem List Patient Active Problem List   Diagnosis Date Noted  . Breast cancer ([place]) 06/22/2017  .  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 Genetic testing 04/17/2017  . DJD (degenerative joint disease) of knee 03/30/2013  . Unspecified sleep apnea 03/23/2013  . 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 Weakness 11/28/2011  . Hypertension 11/28/2011  . Hypokalemia 11/28/2011  . LUE weakness 11/28/2011  . Hypothyroidism 11/28/2011  . Thyroid cancer ([place]) 04/28/2010   Donato Heinz. Owens Shark PT  Norwood Levo 07/28/2017, 9:35 AM  [place], [place], [address] Phone: [phone]   Fax:  [phone]  Name: SIA GABRIELSEN MRN: [id number] Date of Birth: 03-Sep-1950

## 2017-07-29 ENCOUNTER — Ambulatory Visit: Payer: Medicare Other

## 2017-07-30 ENCOUNTER — Ambulatory Visit: Payer: Medicare Other

## 2017-08-05 ENCOUNTER — Ambulatory Visit: Payer: Medicare Other | Admitting: Physical Therapy

## 2017-08-07 ENCOUNTER — Ambulatory Visit: Payer: Medicare Other | Admitting: Physical Therapy

## 2017-08-07 DIAGNOSIS — M25612 Stiffness of left shoulder, not elsewhere classified: Secondary | ICD-10-CM

## 2017-08-07 DIAGNOSIS — M25511 Pain in right shoulder: Secondary | ICD-10-CM | POA: Diagnosis not present

## 2017-08-07 DIAGNOSIS — M25611 Stiffness of right shoulder, not elsewhere classified: Secondary | ICD-10-CM

## 2017-08-07 DIAGNOSIS — Z483 Aftercare following surgery for neoplasm: Secondary | ICD-10-CM

## 2017-08-07 DIAGNOSIS — M25512 Pain in left shoulder: Secondary | ICD-10-CM | POA: Diagnosis not present

## 2017-08-07 DIAGNOSIS — M6281 Muscle weakness (generalized): Secondary | ICD-10-CM | POA: Diagnosis not present

## 2017-08-07 NOTE — Patient Instructions (Addendum)
1. Decompression Exercise     Cancer Rehab [phone]    Lie on back on firm surface, knees bent, feet flat, arms turned up, out to sides, backs of hands down. Time _5-15__ minutes. Surface: floor   2. Shoulder Press    Start in Decompression Exercise position. Press shoulders downward towards supporting surface. Hold __2-3__ seconds while counting out loud. Repeat _3-5___ times. Do _1-2___ times per day.   3. Head Press    Bring cervical spine (neck) into neutral position (by either tucking the chin towards the chest or tilting the chin upward). Feel weight on back of head. Press head downward into supporting surface.    Hold _2-3__ seconds. Repeat _3-5__ times. Do _1-2__ times per day.   4. Leg Lengthener    Straighten one leg. Pull toes AND forefoot toward knee, extend heel. Lengthen leg by pulling pelvis away from ribs. Hold _2-3__ seconds. Relax. Repeat __4-6__ times. Do other leg.  Surface: floor   5. Leg Press    Straighten one leg down to floor keeping leg aligned with hip. Pull toes AND forefoot toward knee; extend heel.  Press entire leg downward (as if pressing leg into sandy beach). DO NOT BEND KNEE. Hold _2-3__ seconds. Do __4-6__ times. Repeat with other leg.     Over Head Pull: Narrow and Wide Grip   Cancer Rehab [phone]   On back, knees bent, feet flat, band across thighs, elbows straight but relaxed. Pull hands apart (start). Keeping elbows straight, bring arms up and over head, hands toward floor. Keep pull steady on band. Hold momentarily. Return slowly, keeping pull steady, back to start. Then do same with a wider grip on the band (past shoulder width) Repeat _5-10__ times. Band color __yellow____   Side Pull: Double Arm   On back, knees bent, feet flat. Arms perpendicular to body, shoulder level, elbows straight but relaxed. Pull arms out to sides, elbows straight. Resistance band comes across collarbones, hands toward floor. Hold momentarily. Slowly  return to starting position. Repeat _5-10__ times. Band color _yellow____   Sword   On back, knees bent, feet flat, left hand on left hip, right hand above left. Pull right arm DIAGONALLY (hip to shoulder) across chest. Bring right arm along head toward floor. Hold momentarily. Slowly return to starting position. Repeat _5-10__ times. Do with left arm. Band color _yellow_____   Shoulder Rotation: Double Arm   On back, knees bent, feet flat, elbows tucked at sides, bent 90, hands palms up. Pull hands apart and down toward floor, keeping elbows near sides. Hold momentarily. Slowly return to starting position. Repeat _5-10__ times. Band color __yellow____   

## 2017-08-07 NOTE — Therapy (Signed)
[place], [place], [address] Phone: [phone]   Fax:  [phone]  Physical Therapy Treatment  Patient Details  Name: [name] MRN: [id number] Date of Birth: 07-Jun-1950 Referring Provider: Dr. Marla Roe    Encounter Date: 08/07/2017  PT End of Session - 08/07/17 1215    Visit Number  2    Number of Visits  9    Date for PT Re-Evaluation  08/27/17    Authorization Type  Medicare;  KX at visit 15    PT Start Time  0850    PT Stop Time  0934    PT Time Calculation (min)  44 min    Activity Tolerance  Patient tolerated treatment well    Behavior During Therapy  [facility] for tasks assessed/performed       Past Medical History:  Diagnosis Date  . Arthritis   . Breast cancer [facility]) 2004   right breast  . Breast cancer, left ([place])   . Bruises easily   . Cancer [facility]) 2004   rt breast;takes Arimidex daily;thyroid cancer  . Genetic testing 04/17/2017   STAT Breast panel with reflex to Multi-Cancer panel (83 genes) @ [facility] - No pathogenic mutations detected  . History of bronchitis    when she was young  . Hypertension    takes Tribenzor daily  . Hypothyroidism    takes Synthroid daily  . Joint pain   . Joint swelling   . Personal history of chemotherapy 04/2003  . Personal history of radiation therapy 2005  . Pneumonia    hx of when she was young  . Sleep apnea    has a machine but does not use  . Thyroid cancer ([place]) 2012  . Urinary frequency     Past Surgical History:  Procedure Laterality Date  . ANTERIOR CERVICAL DECOMP/DISCECTOMY FUSION  2008  . APPENDECTOMY  2007  . BREAST BIOPSY Right 02/28/2003   malignant  . BREAST LUMPECTOMY  2004  . BREAST RECONSTRUCTION WITH PLACEMENT OF TISSUE EXPANDER AND FLEX HD (ACELLULAR HYDRATED DERMIS) Bilateral 06/22/2017   Procedure: IMMEDIATE BILATERAL BREAST RECONSTRUCTION WITH PLACEMENT OF TISSUE EXPANDER AND FLEX HD (ACELLULAR HYDRATED  DERMIS);  Surgeon: Wallace Going, DO;  Location: [place];  Service: Plastics;  Laterality: Bilateral;  . CESAREAN SECTION  38yrs ago  . COLONOSCOPY    . COLONOSCOPY    . KNEE ARTHROSCOPY  2007  . KNEE ARTHROSCOPY  05/02/2011   Procedure: ARTHROSCOPY KNEE;  Surgeon: Johnn Hai;  Location: [place];  Service: Orthopedics;  Laterality: Right;  . MASTECTOMY W/ SENTINEL NODE BIOPSY Bilateral 06/22/2017   Procedure: LEFT MASTECTOMY WITH SENTINEL LYMPH NODE BIOPSY AND RIGHT PROPHYLACTIC MASTECTOMY;  Surgeon: Jovita Kussmaul, MD;  Location: [place];  Service: General;  Laterality: Bilateral;  . MENISCUS DEBRIDEMENT  05/02/2011   Procedure: DEBRIDEMENT OF MENISCUS;  Surgeon: Johnn Hai;  Location: [place];  Service: Orthopedics;  Laterality: Right;  . porta cath remove  2005  . PORTACATH PLACEMENT  2004  . portacath removal    . THYROID LOBECTOMY Right 09/22/2012   Dr Constance Holster  . THYROIDECTOMY N/A 09/22/2012   Procedure: RIGHT THYROID LOBECTOMY WITH FROZEN SECTION;  Surgeon: Izora Gala, MD;  Location: [place];  Service: ENT;  Laterality: N/A;  . THYROIDECTOMY Left 10/21/2012   Procedure: COMPLETION OF THYROIDECTOMY;  Surgeon: Izora Gala, MD;  Location: [place];  Service: ENT;  Laterality: Left;  . TOTAL KNEE ARTHROPLASTY  04/14/2012   Procedure: TOTAL KNEE ARTHROPLASTY;  Surgeon: Ninetta Lights, MD;  Location: [place];  Service: Orthopedics;  Laterality: Right;  RIGHT ARTHROPLASTY KNEE MEDIAL/LATERAL COMPARTMENTS WITH PATELLA RESURFACING  . TOTAL KNEE ARTHROPLASTY Left 03/30/2013   Procedure: TOTAL KNEE ARTHROPLASTY;  Surgeon: Ninetta Lights, MD;  Location: [place];  Service: Orthopedics;  Laterality: Left;    There were no vitals filed for this visit.  Subjective Assessment - 08/07/17 0853    Subjective  I'm doing okay.  I missed my last two because I'm getting these injections in the expanders and I don't have pain, but  you feel something. Did her stretches yesterday even though she had a fill 3 days ago.    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  She underwent bilateral mastectomy  06/22/2017 with removal of sentinel lymph node from left axilla.  She will not have to have chemo or radiation  She has immedicate expander placement at time of mastectomy.  She is undergoing periordic fills and will have implant exchange in a few months. not sure of time  Past history includes chronic pain and arthritis with left hand and right elbow and low back    Currently in Pain?  Yes    Pain Score  5     Pain Location  Breast    Pain Orientation  Left    Pain Descriptors / Indicators  Discomfort    Aggravating Factors   expander fills    Pain Relieving Factors  time                       [facility] Adult PT Treatment/Exercise - 08/07/17 0001      Lumbar Exercises: Supine   Other Supine Lumbar Exercises  reviewed Meeks decompression exercises, which patient had learned at [facility]      Shoulder Exercises: Supine   Other Supine Exercises  supine dowel rod flexion, abduction, external and internal rotation, chicken wings and lower trunk rotation  5 second holds, 5 times each; pt. used her cane    Other Supine Exercises  instructed in supine scapular series with yellow Theraband x 4 reps each             PT Education - 08/07/17 0859    Education provided  Yes    Education Details  reminded about ABC class and signed her up for that; reviewed Meeks decompression; instructed in supine scapular series vs. yellow Theraband    Person(s) Educated  Patient    Methods  Explanation;Verbal cues;Handout    Comprehension  Verbalized understanding;Returned demonstration       PT Short Term Goals - 06/09/17 1127      PT SHORT TERM GOAL #1   Title  STGs=LTGs      PT SHORT TERM GOAL #2   Title  ...       PT SHORT TERM GOAL #3   Title  ...      PT SHORT TERM GOAL #4   Title  ...        PT Long Term Goals - 07/28/17 0929      PT LONG TERM GOAL #1   Title  Pt will verbalized lymphedema risk reductions     Time  4    Period  Weeks    Status  New      PT LONG TERM GOAL #2   Title  Pt will be independent in a home program for shoulder ROM and strength     Time  4    Period  Weeks    Status  New      PT LONG TERM GOAL #3   Title  Pt will have bilateral shoulder painfree active abduction of 140 degrees so that she can perform her ADLS easily     Time  4    Period  Weeks    Status  New      PT LONG TERM GOAL #4   Title  Pt will decrease Quick DASH score to <25 indicating a funcitonal improvment in UE's     Baseline  45.45    Time  4    Period  Weeks    Status  New            Plan - 08/07/17 1215    Clinical Impression Statement  Pt. did well with exercise today. She had learned decompression exercises earlier, so these were reviewed; dowel exercises using her cane were also reviewed and corrected. She was instructed in supine scapular strengthening.    Rehab Potential  Good    Clinical Impairments Affecting Rehab Potential  ongoing expander fills     PT Frequency  2x / week    PT Duration  4 weeks    PT Treatment/Interventions  ADLs/Self Care Home Management;DME Instruction;Therapeutic activities;Therapeutic exercise;Orthotic Fit/Training;Patient/family education;Manual techniques;Manual lymph drainage;Compression bandaging;Taping;Passive range of motion;Scar mobilization    PT Next Visit Plan  progress HEP for ROM and scapular strengthening.  Teach Strength ABC prior to discharge     PT Home Exercise Plan  supine dowel rod series, lower trunk rotation, decompression, supine scapular exx    Recommended Other Services  plans to attend ABC class 08/10/17    Consulted and Agree with Plan of Care  Patient       Patient will benefit from skilled therapeutic intervention in  order to improve the following deficits and impairments:  Decreased knowledge of use of DME, Decreased skin integrity, Increased fascial restricitons, Pain, Postural dysfunction, Decreased scar mobility, Decreased activity tolerance, Decreased endurance, Decreased range of motion, Decreased strength, Impaired perceived functional ability, Obesity  Visit Diagnosis: Aftercare following surgery for neoplasm  Stiffness of left shoulder, not elsewhere classified  Stiffness of right shoulder, not elsewhere classified     Problem List Patient Active Problem List   Diagnosis Date Noted  . Breast cancer ([place]) 06/22/2017  . 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 Genetic testing 04/17/2017  . DJD (degenerative joint disease) of knee 03/30/2013  . Unspecified sleep apnea 03/23/2013  . 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 Weakness 11/28/2011  . Hypertension 11/28/2011  . Hypokalemia 11/28/2011  . LUE weakness 11/28/2011  . Hypothyroidism 11/28/2011  . Thyroid cancer ([place]) 04/28/2010    Landis Cassaro 08/07/2017, 12:17 PM  [place], [place], [address] Phone: [phone]   Fax:  [phone]  Name: KAE LAUMAN MRN: [id number] Date of Birth: 12/11/1950  Serafina Royals, PT 08/07/17 12:18 PM

## 2017-08-12 ENCOUNTER — Ambulatory Visit: Payer: Medicare Other

## 2017-08-13 ENCOUNTER — Ambulatory Visit: Payer: Medicare Other

## 2017-08-28 DIAGNOSIS — R7309 Other abnormal glucose: Secondary | ICD-10-CM | POA: Diagnosis not present

## 2017-08-28 DIAGNOSIS — I10 Essential (primary) hypertension: Secondary | ICD-10-CM | POA: Diagnosis not present

## 2017-08-29 DIAGNOSIS — E039 Hypothyroidism, unspecified: Secondary | ICD-10-CM | POA: Diagnosis not present

## 2017-08-29 DIAGNOSIS — E785 Hyperlipidemia, unspecified: Secondary | ICD-10-CM | POA: Diagnosis not present

## 2017-08-29 DIAGNOSIS — E119 Type 2 diabetes mellitus without complications: Secondary | ICD-10-CM | POA: Diagnosis not present

## 2017-08-29 DIAGNOSIS — I10 Essential (primary) hypertension: Secondary | ICD-10-CM | POA: Diagnosis not present

## 2017-09-03 ENCOUNTER — Telehealth: Payer: Self-pay | Admitting: *Deleted

## 2017-09-03 NOTE — Telephone Encounter (Signed)
This RN retrieved VM from pt stating she needed to cancel and reschedule her appointment for surviviorship on 10/02/2017 due to pending surgery on same day.  Request for above sent to scheduling.

## 2017-09-07 ENCOUNTER — Telehealth: Payer: Self-pay

## 2017-09-07 NOTE — Telephone Encounter (Signed)
Patient LVM to cancel SCP on 6/7 d/t having surgery the same day.

## 2017-09-11 ENCOUNTER — Ambulatory Visit: Payer: Self-pay | Admitting: Plastic Surgery

## 2017-09-11 ENCOUNTER — Encounter: Payer: Self-pay | Admitting: Physical Therapy

## 2017-09-11 ENCOUNTER — Ambulatory Visit: Payer: Medicare Other | Attending: Plastic Surgery | Admitting: Physical Therapy

## 2017-09-11 DIAGNOSIS — M6281 Muscle weakness (generalized): Secondary | ICD-10-CM

## 2017-09-11 DIAGNOSIS — Z483 Aftercare following surgery for neoplasm: Secondary | ICD-10-CM | POA: Diagnosis not present

## 2017-09-11 DIAGNOSIS — Z9013 Acquired absence of bilateral breasts and nipples: Secondary | ICD-10-CM

## 2017-09-11 DIAGNOSIS — M25611 Stiffness of right shoulder, not elsewhere classified: Secondary | ICD-10-CM | POA: Insufficient documentation

## 2017-09-11 DIAGNOSIS — M25511 Pain in right shoulder: Secondary | ICD-10-CM | POA: Diagnosis not present

## 2017-09-11 NOTE — Therapy (Signed)
[place], [place], [address] Phone: [phone]   Fax:  [phone]  Physical Therapy Treatment  Patient Details  Name: [name] MRN: [id number] Date of Birth: 1951/03/28 Referring Provider: Dr. Marla Roe    Encounter Date: 09/11/2017  PT End of Session - 09/11/17 0920    Visit Number  3    Number of Visits  9    Date for PT Re-Evaluation  09/23/17    PT Start Time  0835    PT Stop Time  0923    PT Time Calculation (min)  48 min    Activity Tolerance  Patient tolerated treatment well    Behavior During Therapy  [facility] for tasks assessed/performed       Past Medical History:  Diagnosis Date  . Arthritis   . Breast cancer [facility]) 2004   right breast  . Breast cancer, left ([place])   . Bruises easily   . Cancer [facility]) 2004   rt breast;takes Arimidex daily;thyroid cancer  . Genetic testing 04/17/2017   STAT Breast panel with reflex to Multi-Cancer panel (83 genes) @ [facility] - No pathogenic mutations detected  . History of bronchitis    when she was young  . Hypertension    takes Tribenzor daily  . Hypothyroidism    takes Synthroid daily  . Joint pain   . Joint swelling   . Personal history of chemotherapy 04/2003  . Personal history of radiation therapy 2005  . Pneumonia    hx of when she was young  . Sleep apnea    has a machine but does not use  . Thyroid cancer ([place]) 2012  . Urinary frequency     Past Surgical History:  Procedure Laterality Date  . ANTERIOR CERVICAL DECOMP/DISCECTOMY FUSION  2008  . APPENDECTOMY  2007  . BREAST BIOPSY Right 02/28/2003   malignant  . BREAST LUMPECTOMY  2004  . BREAST RECONSTRUCTION WITH PLACEMENT OF TISSUE EXPANDER AND FLEX HD (ACELLULAR HYDRATED DERMIS) Bilateral 06/22/2017   Procedure: IMMEDIATE BILATERAL BREAST RECONSTRUCTION WITH PLACEMENT OF TISSUE EXPANDER AND FLEX HD (ACELLULAR HYDRATED DERMIS);  Surgeon: Wallace Going, DO;   Location: [place];  Service: Plastics;  Laterality: Bilateral;  . CESAREAN SECTION  38yrs ago  . COLONOSCOPY    . COLONOSCOPY    . KNEE ARTHROSCOPY  2007  . KNEE ARTHROSCOPY  05/02/2011   Procedure: ARTHROSCOPY KNEE;  Surgeon: Johnn Hai;  Location: [place];  Service: Orthopedics;  Laterality: Right;  . MASTECTOMY W/ SENTINEL NODE BIOPSY Bilateral 06/22/2017   Procedure: LEFT MASTECTOMY WITH SENTINEL LYMPH NODE BIOPSY AND RIGHT PROPHYLACTIC MASTECTOMY;  Surgeon: Jovita Kussmaul, MD;  Location: [place];  Service: General;  Laterality: Bilateral;  . MENISCUS DEBRIDEMENT  05/02/2011   Procedure: DEBRIDEMENT OF MENISCUS;  Surgeon: Johnn Hai;  Location: [place];  Service: Orthopedics;  Laterality: Right;  . porta cath remove  2005  . PORTACATH PLACEMENT  2004  . portacath removal    . THYROID LOBECTOMY Right 09/22/2012   Dr Constance Holster  . THYROIDECTOMY N/A 09/22/2012   Procedure: RIGHT THYROID LOBECTOMY WITH FROZEN SECTION;  Surgeon: Izora Gala, MD;  Location: [place];  Service: ENT;  Laterality: N/A;  . THYROIDECTOMY Left 10/21/2012   Procedure: COMPLETION OF THYROIDECTOMY;  Surgeon: Izora Gala, MD;  Location: [place];  Service: ENT;  Laterality: Left;  . TOTAL KNEE ARTHROPLASTY  04/14/2012   Procedure:  TOTAL KNEE ARTHROPLASTY;  Surgeon: Ninetta Lights, MD;  Location: [place];  Service: Orthopedics;  Laterality: Right;  RIGHT ARTHROPLASTY KNEE MEDIAL/LATERAL COMPARTMENTS WITH PATELLA RESURFACING  . TOTAL KNEE ARTHROPLASTY Left 03/30/2013   Procedure: TOTAL KNEE ARTHROPLASTY;  Surgeon: Ninetta Lights, MD;  Location: [place];  Service: Orthopedics;  Laterality: Left;    There were no vitals filed for this visit.  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  She underwent bilateral mastectomy  06/22/2017 with removal of sentinel lymph node from left axilla.  She will not have to have chemo or radiation  She has immedicate expander placement at time of mastectomy.  She is undergoing periordic fills and will have implant exchange in a few months. not sure of time  Past history includes chronic pain and arthritis with left hand and right elbow and low back    Currently in Pain?  No/denies         [facility] PT Assessment - 09/11/17 0001      Assessment   Medical Diagnosis  left breast cancer    Referring Provider  Dr. Marla Roe     Onset Date/Surgical Date  06/22/17      Prior Function   Level of Independence  Independent      AROM   Right Shoulder Flexion  151 Degrees    Right Shoulder ABduction  160 Degrees    Left Shoulder Flexion  155 Degrees    Left Shoulder ABduction  140 Degrees                   [facility] Adult PT Treatment/Exercise - 09/11/17 0001      Neck Exercises: Seated   Other Seated Exercise  neck range of motion , overstretch to each side bend       Lumbar Exercises: Supine   Other Supine Lumbar Exercises  lower trunk rotation       Shoulder Exercises: Standing   Extension  AAROM;Both;5 reps with dowel with cues to open up chest       Shoulder Exercises: ROM/Strengthening   Other ROM/Strengthening Exercises  in sidelying for shoulder abduction with therapist assisting with upward rotation of scapula, small circles with hand pointed to ceiling       Manual Therapy   Manual Therapy  Joint mobilization;Soft tissue mobilization;Myofascial release;Scapular mobilization    Joint Mobilization  distraction and anteior  /posterior glides to glenohumeral joing     Myofascial Release  to tender trigger points at posterior scapula and axilla as well as pec major     Scapular Mobilization  slow mobilizaion in all planes with prolonged holds in inferior glide              PT Education - 09/11/17 0928    Education provided  Yes    Education Details  standing dowel shoulder extension stretch  Person(s) Educated  Patient    Methods  Explanation;Demonstration    Comprehension  Returned demonstration;Verbalized understanding       PT Short Term Goals - 06/09/17 1127      PT SHORT TERM GOAL #1   Title  STGs=LTGs      PT SHORT TERM GOAL #2   Title  ...      PT SHORT TERM GOAL #3   Title  ...      PT SHORT TERM GOAL #4   Title  ...        PT Long Term Goals - 09/11/17 0931      PT LONG TERM GOAL #1   Title  Pt will verbalized lymphedema risk reductions     Status  On-going      PT LONG TERM GOAL #2   Title  Pt will be independent in a home program for shoulder ROM and strength     Status  On-going      PT LONG TERM GOAL #3   Title  Pt will have bilateral shoulder painfree active abduction of 140 degrees so that she can perform her ADLS easily     Baseline  has achieved 140 degrees of shoulder abduction, but has pain     Status  On-going      PT LONG TERM GOAL #4   Title  Pt will decrease Quick DASH score to <25 indicating a funcitonal improvment in UE's     Baseline  45.45    Status  On-going            Plan - 09/11/17 0928    Clinical Impression Statement  Pt returns to therapy after a month's absence . she is preparing for surgery in about 10 days and is having problems with pain  and decreased motion in right shoulder.  She wants to come for several session before surgery to loosen up her muscles. She has improved ROM since last visit Recert sent today     PT Frequency  3x / week    PT Duration  2 weeks    PT Treatment/Interventions  ADLs/Self Care Home Management;DME  Instruction;Therapeutic activities;Therapeutic exercise;Orthotic Fit/Training;Patient/family education;Manual techniques;Manual lymph drainage;Compression bandaging;Taping;Passive range of motion;Scar mobilization    PT Next Visit Plan  focuse on soft tissue work to right shoulder and scapular area with joint and scapular mobilization , stretch into shoulder extension and internal rotation     Consulted and Agree with Plan of Care  Patient       Patient will benefit from skilled therapeutic intervention in order to improve the following deficits and impairments:  Decreased knowledge of use of DME, Decreased skin integrity, Increased fascial restricitons, Pain, Postural dysfunction, Decreased scar mobility, Decreased activity tolerance, Decreased endurance, Decreased range of motion, Decreased strength, Impaired perceived functional ability, Obesity  Visit Diagnosis: Aftercare following surgery for neoplasm - Plan: PT plan of care cert/re-cert  Stiffness of right shoulder, not elsewhere classified - Plan: PT plan of care cert/re-cert  Acute pain of right shoulder - Plan: PT plan of care cert/re-cert  Muscle weakness (generalized) - Plan: PT plan of care cert/re-cert     Problem List Patient Active Problem List   Diagnosis Date Noted  . Breast cancer ([place]) 06/22/2017  . 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 Genetic testing 04/17/2017  . DJD (degenerative joint disease) of knee 03/30/2013  . Unspecified sleep apnea 03/23/2013  . 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 Weakness  11/28/2011  . Hypertension 11/28/2011  . Hypokalemia 11/28/2011  . LUE weakness 11/28/2011  . Hypothyroidism 11/28/2011  . Thyroid cancer ([place]) 04/28/2010   Donato Heinz. [place] Shark PT  Norwood Levo 09/11/2017, 12:17 PM  [place], [place], [address] Phone: [phone]   Fax:  [phone]  Name: ROBERTA ANGELL MRN: [id number] Date of Birth: October 09, 1950

## 2017-09-12 NOTE — H&P (Signed)
[name] is an 67 y.o. female.   Chief Complaint: acquired absence of breasts HPI: The patient is a 67 yrs old bf here for bilateral breast reconstruction.  She underwent placement of tissue expanders and dermal matrix on 06/22/17.  She was radiated on the right side and needs a latissimus flap.  She was diagnosed with new LEFT breast cancer, ductal carcinoma in-situ, ER / PR positive.  She had right breast cancer in the past and underwent a lumpectomy with radiation 15 years ago.  She received chemotherapy treatment and tamoxifen.  The left breast cancer was detected by routine mammogram in the upper outer quadrant ~ 5 cm in size.  She is 5 feet 4 inches tall, weight is 218 pounds.  Preop bra is 34-36 B but she measures 40-41 inches chest circumference.  She had been doing PT for shoulder motion.   Past Medical History:  Diagnosis Date  . Arthritis   . Breast cancer [facility]) 2004   right breast  . Breast cancer, left ([place])   . Bruises easily   . Cancer [facility]) 2004   rt breast;takes Arimidex daily;thyroid cancer  . Genetic testing 04/17/2017   STAT Breast panel with reflex to Multi-Cancer panel (83 genes) @ [facility] - No pathogenic mutations detected  . History of bronchitis    when she was young  . Hypertension    takes Tribenzor daily  . Hypothyroidism    takes Synthroid daily  . Joint pain   . Joint swelling   . Personal history of chemotherapy 04/2003  . Personal history of radiation therapy 2005  . Pneumonia    hx of when she was young  . Sleep apnea    has a machine but does not use  . Thyroid cancer ([place]) 2012  . Urinary frequency     Past Surgical History:  Procedure Laterality Date  . ANTERIOR CERVICAL DECOMP/DISCECTOMY FUSION  2008  . APPENDECTOMY  2007  . BREAST BIOPSY Right 02/28/2003   malignant  . BREAST LUMPECTOMY  2004  . BREAST RECONSTRUCTION WITH PLACEMENT OF TISSUE EXPANDER AND FLEX HD (ACELLULAR HYDRATED DERMIS) Bilateral 06/22/2017   Procedure: IMMEDIATE  BILATERAL BREAST RECONSTRUCTION WITH PLACEMENT OF TISSUE EXPANDER AND FLEX HD (ACELLULAR HYDRATED DERMIS);  Surgeon: Wallace Going, DO;  Location: [place];  Service: Plastics;  Laterality: Bilateral;  . CESAREAN SECTION  32yrs ago  . COLONOSCOPY    . COLONOSCOPY    . KNEE ARTHROSCOPY  2007  . KNEE ARTHROSCOPY  05/02/2011   Procedure: ARTHROSCOPY KNEE;  Surgeon: Johnn Hai;  Location: [place];  Service: Orthopedics;  Laterality: Right;  . MASTECTOMY W/ SENTINEL NODE BIOPSY Bilateral 06/22/2017   Procedure: LEFT MASTECTOMY WITH SENTINEL LYMPH NODE BIOPSY AND RIGHT PROPHYLACTIC MASTECTOMY;  Surgeon: Jovita Kussmaul, MD;  Location: [place];  Service: General;  Laterality: Bilateral;  . MENISCUS DEBRIDEMENT  05/02/2011   Procedure: DEBRIDEMENT OF MENISCUS;  Surgeon: Johnn Hai;  Location: [place];  Service: Orthopedics;  Laterality: Right;  . porta cath remove  2005  . PORTACATH PLACEMENT  2004  . portacath removal    . THYROID LOBECTOMY Right 09/22/2012   Dr Constance Holster  . THYROIDECTOMY N/A 09/22/2012   Procedure: RIGHT THYROID LOBECTOMY WITH FROZEN SECTION;  Surgeon: Izora Gala, MD;  Location: [place];  Service: ENT;  Laterality: N/A;  . THYROIDECTOMY Left 10/21/2012   Procedure: COMPLETION OF THYROIDECTOMY;  Surgeon: Izora Gala, MD;  [facility];  Service: ENT;  Laterality: Left;  . TOTAL KNEE ARTHROPLASTY  04/14/2012   Procedure: TOTAL KNEE ARTHROPLASTY;  Surgeon: Ninetta Lights, MD;  Location: [place];  Service: Orthopedics;  Laterality: Right;  RIGHT ARTHROPLASTY KNEE MEDIAL/LATERAL COMPARTMENTS WITH PATELLA RESURFACING  . TOTAL KNEE ARTHROPLASTY Left 03/30/2013   Procedure: TOTAL KNEE ARTHROPLASTY;  Surgeon: Ninetta Lights, MD;  Location: [place];  Service: Orthopedics;  Laterality: Left;    Family History  Problem Relation Age of Onset  . Breast cancer Mother 33       currently 40  . Liver cancer Father   .  Breast cancer Other        pat grandfather's sister; dx 18s   Social History:  reports that she has never smoked. She has never used smokeless tobacco. She reports that she drinks alcohol. She reports that she does not use drugs.  Allergies:  Allergies  Allergen Reactions  . Penicillins Other (See Comments)    Childhood reaction - pt states she is not allergic Has patient had a PCN reaction causing immediate rash, facial/tongue/throat swelling, SOB or lightheadedness with hypotension: Unknown Has patient had a PCN reaction causing severe rash involving mucus membranes or skin necrosis: Unknown Has patient had a PCN reaction that required hospitalization: Unknown Has patient had a PCN reaction occurring within the last 10 years: No If all of the above answers are "NO", then may proceed with Cephalosporin     No medications prior to admission.    No results found for this or any previous visit (from the past 48 hour(s)). No results found.  Review of Systems  Constitutional: Negative.   HENT: Negative.   Eyes: Negative.   Respiratory: Negative.   Cardiovascular: Negative.   Gastrointestinal: Negative.   Genitourinary: Negative.   Musculoskeletal: Negative.   Skin: Negative.   Neurological: Negative.   Psychiatric/Behavioral: Negative.     There were no vitals taken for this visit. Physical Exam  Constitutional: She is oriented to person, place, and time. She appears well-developed and well-nourished.  HENT:  Head: Normocephalic and atraumatic.  Eyes: Pupils are equal, round, and reactive to light. EOM are normal.  Cardiovascular: Normal rate.  Respiratory: Effort normal. No respiratory distress.  GI: Soft. She exhibits no distension.  Musculoskeletal: Normal range of motion.  Neurological: She is alert and oriented to person, place, and time.  Skin: Skin is warm. No rash noted. No erythema. No pallor.  Psychiatric: She has a normal mood and affect. Her behavior is  normal. Judgment and thought content normal.     Assessment/Plan The right expander has a total of 570/475 cc and the  Left expander has a total of 740/475 cc.   Plan to proceed with right breast latissimus dorsi flap reconstruction with immediate silicone implant placement and exchange surgery on the left breast with removal of expander and placement of expander vs implant (Less likely and this was discussed with the patient) in late May.  Photos obtained this visit.  Plan out of work start date of ~ May 20th.   [place], DO 09/12/2017, 5:44 PM

## 2017-09-14 NOTE — Pre-Procedure Instructions (Signed)
[name]  09/14/2017      [facility] - [place], [place] - [address] [place] [address] Phone: [phone] Fax: [phone]  [facility] [place], [place] [address] [place] [address] Phone: [phone] Fax: [phone]  [id number] [place], [place] [address] Phone: [phone] Fax: [phone]    Your procedure is scheduled on Wed. May 29  Report to [facility] Admitting at 10:15 A.M.  Call this number if you have problems the morning of surgery:  [phone]   Remember:  No food  or drink after midnight.                        Take these medicines the morning of surgery with A SIP OF WATER : levothyroxine (synthroid)    Do not wear jewelry, make-up or nail polish.  Do not wear lotions, powders, or perfumes, or deodorant.  Do not shave 48 hours prior to surgery.  Men may shave face and neck.  Do not bring valuables to the hospital.  [facility] is not responsible for any belongings or valuables.  Contacts, dentures or bridgework may not be worn into surgery.  Leave your suitcase in the car.  After surgery it may be brought to your room.  For patients admitted to the hospital, discharge time will be determined by your treatment team.  Patients discharged the day of surgery will not be allowed to drive home.    Special instructions:  [facility]- Preparing For Surgery  Before surgery, you can play an important role. Because skin is not sterile, your skin needs to be as free of germs as possible. You can reduce the number of germs on your skin by washing with CHG (chlorahexidine gluconate) Soap before surgery.  CHG is an antiseptic cleaner which kills germs and bonds with the skin to continue killing germs even after washing.    Oral Hygiene is also important to reduce  your risk of infection.  Remember - BRUSH YOUR TEETH THE MORNING OF SURGERY WITH YOUR TOOTHPASTE  Please do not use if you have an allergy to CHG or antibacterial soaps. If your skin becomes reddened/irritated stop using the CHG.  Do not shave (including legs and underarms) for at least 48 hours prior to first CHG shower. It is OK to shave your face.  Please follow these instructions carefully.   1. Shower the NIGHT BEFORE SURGERY and the MORNING OF SURGERY with CHG.   2. If you chose to wash your hair, wash your hair first as usual with your normal shampoo.  3. After you shampoo, rinse your hair and body thoroughly to remove the shampoo.  4. Use CHG as you would any other liquid soap. You can apply CHG directly to the skin and wash gently with a scrungie or a clean washcloth.   5. Apply the CHG Soap to your body ONLY FROM THE NECK DOWN.  Do not use on open wounds or open sores. Avoid contact with your eyes, ears, mouth and genitals (private parts). Wash Face and genitals (private parts)  with your normal soap.  6. Wash thoroughly, paying special attention to the area where your surgery will be performed.  7. Thoroughly rinse your body with warm water from the neck down.  8. DO NOT shower/wash with your normal soap after using and rinsing off the CHG Soap.  9. Pat yourself dry with a CLEAN TOWEL.  10. Wear CLEAN PAJAMAS to bed the night before surgery, wear comfortable clothes the morning of surgery  11. Place CLEAN SHEETS on your bed the night of your first shower and DO NOT SLEEP WITH PETS.    Day of Surgery:  Do not apply any deodorants/lotions.  Please wear clean clothes to the hospital/surgery center.   Remember to brush your teeth WITH YOUR TOOTHPASTE    Please read over the following fact sheets that you were given. Coughing and Deep Breathing and Surgical Site Infection Prevention

## 2017-09-15 ENCOUNTER — Other Ambulatory Visit: Payer: Self-pay

## 2017-09-15 ENCOUNTER — Encounter (HOSPITAL_COMMUNITY)
Admission: RE | Admit: 2017-09-15 | Discharge: 2017-09-15 | Disposition: A | Discharge status: Home / Self Care | Payer: Medicare Other | Source: Ambulatory Visit | Attending: Plastic Surgery | Admitting: Plastic Surgery

## 2017-09-15 ENCOUNTER — Encounter (HOSPITAL_COMMUNITY): Payer: Self-pay

## 2017-09-15 DIAGNOSIS — Z01812 Encounter for preprocedural laboratory examination: Secondary | ICD-10-CM | POA: Diagnosis not present

## 2017-09-15 DIAGNOSIS — E890 Postprocedural hypothyroidism: Secondary | ICD-10-CM | POA: Diagnosis not present

## 2017-09-15 DIAGNOSIS — C73 Malignant neoplasm of thyroid gland: Secondary | ICD-10-CM | POA: Diagnosis not present

## 2017-09-15 LAB — BASIC METABOLIC PANEL
Anion gap: 8 (ref 5–15)
BUN: 12 mg/dL (ref 6–20)
CO2: 26 mmol/L (ref 22–32)
Calcium: 8.8 mg/dL — ABNORMAL LOW (ref 8.9–10.3)
Chloride: 106 mmol/L (ref 101–111)
Creatinine, Ser: 0.99 mg/dL (ref 0.44–1.00)
GFR, EST NON AFRICAN AMERICAN: 58 mL/min — AB (ref >60)
Glucose, Bld: 131 mg/dL — ABNORMAL HIGH (ref 65–99)
POTASSIUM: 3.4 mmol/L — AB (ref 3.5–5.1)
SODIUM: 140 mmol/L (ref 135–145)

## 2017-09-15 LAB — CBC
HEMATOCRIT: 39.3 % (ref 36.0–46.0)
Hemoglobin: 12.7 g/dL (ref 12.0–15.0)
MCH: 28.7 pg (ref 26.0–34.0)
MCHC: 32.3 g/dL (ref 30.0–36.0)
MCV: 88.7 fL (ref 78.0–100.0)
PLATELETS: 275 10*3/uL (ref 150–400)
RBC: 4.43 MIL/uL (ref 3.87–5.11)
RDW: 12.1 % (ref 11.5–15.5)
WBC: 5.9 10*3/uL (ref 4.0–10.5)

## 2017-09-15 NOTE — Progress Notes (Addendum)
PCP: Dr. Lucianne Lei  No cardiologist

## 2017-09-18 ENCOUNTER — Ambulatory Visit: Payer: Medicare Other | Admitting: Physical Therapy

## 2017-09-18 DIAGNOSIS — M25611 Stiffness of right shoulder, not elsewhere classified: Secondary | ICD-10-CM | POA: Diagnosis not present

## 2017-09-18 DIAGNOSIS — M25511 Pain in right shoulder: Secondary | ICD-10-CM | POA: Diagnosis not present

## 2017-09-18 DIAGNOSIS — Z483 Aftercare following surgery for neoplasm: Secondary | ICD-10-CM

## 2017-09-18 DIAGNOSIS — M6281 Muscle weakness (generalized): Secondary | ICD-10-CM | POA: Diagnosis not present

## 2017-09-18 NOTE — Therapy (Signed)
[place], [place], [address] Phone: [phone]   Fax:  [phone]  Physical Therapy Treatment  Patient Details  Name: [name] MRN: [id number] Date of Birth: [birth year] Referring Provider: Dr. Marla Roe    Encounter Date: 09/18/2017  PT End of Session - 09/18/17 1216    Visit Number  3    Number of Visits  9    Date for PT Re-Evaluation  09/23/17    Authorization Type  Medicare;  KX at visit 15    PT Start Time  1022    PT Stop Time  1105    PT Time Calculation (min)  43 min    Activity Tolerance  Patient tolerated treatment well    Behavior During Therapy  [facility] for tasks assessed/performed       Past Medical History:  Diagnosis Date  . Arthritis    left hand  . Breast cancer [facility]) 2004   right breast  . Breast cancer, left ([place])   . Bruises easily   . Cancer [facility]) 2004   rt breast;takes Arimidex daily;thyroid cancer  . Genetic testing 04/17/2017   STAT Breast panel with reflex to Multi-Cancer panel (83 genes) @ [facility] - No pathogenic mutations detected  . History of bronchitis    when she was young  . Hypertension    takes Tribenzor daily  . Hypothyroidism    takes Synthroid daily  . Joint pain   . Joint swelling   . Personal history of chemotherapy 04/2003  . Personal history of radiation therapy 2005  . Pneumonia    hx of when she was young  . Sleep apnea    has a machine but does not use  . Thyroid cancer ([place]) 2012  . Urinary frequency     Past Surgical History:  Procedure Laterality Date  . ANTERIOR CERVICAL DECOMP/DISCECTOMY FUSION  2008  . APPENDECTOMY  2007  . BREAST BIOPSY Right 02/28/2003   malignant  . BREAST LUMPECTOMY  2004  . BREAST RECONSTRUCTION WITH PLACEMENT OF TISSUE EXPANDER AND FLEX HD (ACELLULAR HYDRATED DERMIS) Bilateral 06/22/2017   Procedure: IMMEDIATE BILATERAL BREAST RECONSTRUCTION WITH PLACEMENT OF TISSUE EXPANDER AND FLEX HD (ACELLULAR  HYDRATED DERMIS);  Surgeon: Wallace Going, DO;  Location: [place];  Service: Plastics;  Laterality: Bilateral;  . CESAREAN SECTION  48yr ago  . COLONOSCOPY    . COLONOSCOPY    . KNEE ARTHROSCOPY  2007  . KNEE ARTHROSCOPY  05/02/2011   Procedure: ARTHROSCOPY KNEE;  Surgeon: JJohnn Hai  [facility]  Service: Orthopedics;  Laterality: Right;  . MASTECTOMY W/ SENTINEL NODE BIOPSY Bilateral 06/22/2017   Procedure: LEFT MASTECTOMY WITH SENTINEL LYMPH NODE BIOPSY AND RIGHT PROPHYLACTIC MASTECTOMY;  Surgeon: TJovita Kussmaul MD;  Location: [place]  Service: General;  Laterality: Bilateral;  . MENISCUS DEBRIDEMENT  05/02/2011   Procedure: DEBRIDEMENT OF MENISCUS;  Surgeon: JJohnn Hai  Location[place] [facility]  Service: Orthopedics;  Laterality: Right;  . porta cath remove  2005  . PORTACATH PLACEMENT  2004  . portacath removal    . THYROID LOBECTOMY Right 09/22/2012   Dr RConstance Holster . THYROIDECTOMY N/A 09/22/2012   Procedure: RIGHT THYROID LOBECTOMY WITH FROZEN SECTION;  Surgeon: JIzora Gala MD;  Location: [facility]  Service: ENT;  Laterality: N/A;  . THYROIDECTOMY Left 10/21/2012   Procedure: COMPLETION OF THYROIDECTOMY;  Surgeon: JIzora Gala MD;  [place] [facility]  Service: ENT;  Laterality: Left;  . TOTAL KNEE ARTHROPLASTY  04/14/2012   Procedure: TOTAL KNEE ARTHROPLASTY;  Surgeon: Ninetta Lights, MD;  Location: [place];  Service: Orthopedics;  Laterality: Right;  RIGHT ARTHROPLASTY KNEE MEDIAL/LATERAL COMPARTMENTS WITH PATELLA RESURFACING  . TOTAL KNEE ARTHROPLASTY Left 03/30/2013   Procedure: TOTAL KNEE ARTHROPLASTY;  Surgeon: Ninetta Lights, MD;  Location: [place];  Service: Orthopedics;  Laterality: Left;    There were no vitals filed for this visit.  Subjective Assessment - 09/18/17 1026    Subjective  Had my thyroid tested, and one of them is off, so I imagine they're going to change medication on Tuesday.  I  have my surgery next Wednesday. 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  She underwent bilateral mastectomy  06/22/2017 with removal of sentinel lymph node from left axilla.  She will not have to have chemo or radiation  She has immedicate expander placement at time of mastectomy.  She is undergoing periordic fills and will have implant exchange in a few months. not sure of time  Past history includes chronic pain and arthritis with left hand and right elbow and low back    Currently in Pain?  No/denies                       [facility] Adult PT Treatment/Exercise - 09/18/17 0001      Shoulder Exercises: Stretch   Other Shoulder Stretches  forward reach with scapular protraction, then scapular retraction x 2 each way actively      Manual Therapy   Manual Therapy  Other (comment)    Joint Mobilization  in supine, posterior, inferior and anterior glides of right shoulder, grade 2-3    Myofascial Release  rightt UE myofascial pulling with movement into abduction    Scapular Mobilization  in left sidelying to right scapula, rhythmic protraction motions and prolonged depression; tried to get up under medial border of scapula but was unable    Passive ROM  In supine to right shoulder into er, abduction, and flexion; at right edge of mat, also into extension, horizontal abduction, and end range D2 positions    Other Manual Therapy  trigger point release and soft tissue work to right scapula at medial border               PT Short Term Goals - 06/09/17 1127      PT SHORT TERM GOAL #1   Title  STGs=LTGs      PT SHORT TERM GOAL #2   Title  ...      PT SHORT TERM GOAL #3   Title  ...      PT SHORT TERM GOAL #4   Title  ...        PT Long Term Goals - 09/18/17 1219      PT LONG TERM GOAL #2   Title  Pt will be  independent in a home program for shoulder ROM and strength     Status  Partially Met      PT LONG TERM GOAL #3   Title  Pt will have bilateral shoulder painfree active abduction of 140 degrees so that she can perform her ADLS easily     Baseline  has achieved 140 degrees of shoulder abduction, but has  pain     Status  Partially Met      PT LONG TERM GOAL #4   Title  Pt will decrease Quick DASH score to <25 indicating a funcitonal improvment in UE's     Baseline  45.45    Status  Not Met            Plan - 09/18/17 1216    Clinical Impression Statement  Pt. felt she got good stretches and felt good and relaxed at end of session.  She will have surgery next week, so will not have more therapy before then, but expects she will be re-referred once healed from that.     Rehab Potential  Good    PT Frequency  3x / week    PT Duration  2 weeks    PT Treatment/Interventions  ADLs/Self Care Home Management;DME Instruction;Therapeutic activities;Therapeutic exercise;Orthotic Fit/Training;Patient/family education;Manual techniques;Manual lymph drainage;Compression bandaging;Taping;Passive range of motion;Scar mobilization    PT Next Visit Plan  Discharge today, as patient will have surgery in a few days.    PT Home Exercise Plan  supine dowel rod series, lower trunk rotation, decompression, supine scapular exx    Consulted and Agree with Plan of Care  Patient       Patient will benefit from skilled therapeutic intervention in order to improve the following deficits and impairments:  Decreased knowledge of use of DME, Decreased skin integrity, Increased fascial restricitons, Pain, Postural dysfunction, Decreased scar mobility, Decreased activity tolerance, Decreased endurance, Decreased range of motion, Decreased strength, Impaired perceived functional ability, Obesity  Visit Diagnosis: Aftercare following surgery for neoplasm  Stiffness of right shoulder, not elsewhere classified  Acute  pain of right shoulder     Problem List Patient Active Problem List   Diagnosis Date Noted  . Breast cancer ([place]) 06/22/2017  . 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 Genetic testing 04/17/2017  . DJD (degenerative joint disease) of knee 03/30/2013  . Unspecified sleep apnea 03/23/2013  . 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 Weakness 11/28/2011  . Hypertension 11/28/2011  . Hypokalemia 11/28/2011  . LUE weakness 11/28/2011  . Hypothyroidism 11/28/2011  . Thyroid cancer ([place]) 04/28/2010    Chancellor Vanderloop 09/18/2017, 12:21 PM  [place], [place], [address] Phone: [phone]   Fax:  [phone]  Name: [name] MRN: [id number] Date of Birth: 1950/11/07  PHYSICAL THERAPY DISCHARGE SUMMARY  Visits from Start of Care: 3  Current functional level related to goals / functional outcomes: Goals partially met as noted above.   Remaining deficits: Still with tightness at right shoulder and scapular muscles.   Education / Equipment: Home exercise program.  Plan: Patient agrees to discharge.  Patient goals were partially met. Patient is being discharged due to the patient's request.  She will have surgery next week.?????   Serafina Royals, PT 09/18/17 12:23 PM

## 2017-09-22 DIAGNOSIS — C73 Malignant neoplasm of thyroid gland: Secondary | ICD-10-CM | POA: Diagnosis not present

## 2017-09-22 DIAGNOSIS — E890 Postprocedural hypothyroidism: Secondary | ICD-10-CM | POA: Diagnosis not present

## 2017-09-22 DIAGNOSIS — I10 Essential (primary) hypertension: Secondary | ICD-10-CM | POA: Diagnosis not present

## 2017-09-23 ENCOUNTER — Encounter (HOSPITAL_COMMUNITY): Payer: Self-pay

## 2017-09-23 ENCOUNTER — Inpatient Hospital Stay (HOSPITAL_COMMUNITY): Payer: Medicare Other | Admitting: Anesthesiology

## 2017-09-23 ENCOUNTER — Encounter (HOSPITAL_COMMUNITY)
Admission: RE | Disposition: A | Discharge status: Home / Self Care | Payer: Self-pay | Source: Home / Self Care | Attending: Plastic Surgery

## 2017-09-23 ENCOUNTER — Encounter

## 2017-09-23 ENCOUNTER — Inpatient Hospital Stay (HOSPITAL_COMMUNITY)
Admission: RE | Admit: 2017-09-23 | Discharge: 2017-09-25 | DRG: 941 | Disposition: A | Discharge status: Home / Self Care | Payer: Medicare Other | Attending: Plastic Surgery | Admitting: Plastic Surgery

## 2017-09-23 DIAGNOSIS — Z79811 Long term (current) use of aromatase inhibitors: Secondary | ICD-10-CM | POA: Diagnosis not present

## 2017-09-23 DIAGNOSIS — Z96653 Presence of artificial knee joint, bilateral: Secondary | ICD-10-CM | POA: Diagnosis present

## 2017-09-23 DIAGNOSIS — E890 Postprocedural hypothyroidism: Secondary | ICD-10-CM | POA: Diagnosis not present

## 2017-09-23 DIAGNOSIS — G4730 Sleep apnea, unspecified: Secondary | ICD-10-CM | POA: Diagnosis not present

## 2017-09-23 DIAGNOSIS — Z421 Encounter for breast reconstruction following mastectomy: Secondary | ICD-10-CM | POA: Diagnosis not present

## 2017-09-23 DIAGNOSIS — Z923 Personal history of irradiation: Secondary | ICD-10-CM

## 2017-09-23 DIAGNOSIS — Z9221 Personal history of antineoplastic chemotherapy: Secondary | ICD-10-CM

## 2017-09-23 DIAGNOSIS — Z9013 Acquired absence of bilateral breasts and nipples: Principal | ICD-10-CM

## 2017-09-23 DIAGNOSIS — I10 Essential (primary) hypertension: Secondary | ICD-10-CM | POA: Diagnosis not present

## 2017-09-23 DIAGNOSIS — Z880 Allergy status to penicillin: Secondary | ICD-10-CM

## 2017-09-23 DIAGNOSIS — C50911 Malignant neoplasm of unspecified site of right female breast: Secondary | ICD-10-CM | POA: Diagnosis not present

## 2017-09-23 DIAGNOSIS — Z853 Personal history of malignant neoplasm of breast: Secondary | ICD-10-CM

## 2017-09-23 DIAGNOSIS — Z9011 Acquired absence of right breast and nipple: Secondary | ICD-10-CM

## 2017-09-23 DIAGNOSIS — Z803 Family history of malignant neoplasm of breast: Secondary | ICD-10-CM | POA: Diagnosis not present

## 2017-09-23 DIAGNOSIS — Z981 Arthrodesis status: Secondary | ICD-10-CM

## 2017-09-23 DIAGNOSIS — Z85850 Personal history of malignant neoplasm of thyroid: Secondary | ICD-10-CM | POA: Diagnosis not present

## 2017-09-23 DIAGNOSIS — E039 Hypothyroidism, unspecified: Secondary | ICD-10-CM | POA: Diagnosis not present

## 2017-09-23 HISTORY — DX: Low back pain, unspecified: M54.50

## 2017-09-23 HISTORY — DX: Other chronic pain: G89.29

## 2017-09-23 HISTORY — DX: Other enthesopathies, not elsewhere classified: M77.8

## 2017-09-23 HISTORY — DX: Malignant neoplasm of unspecified site of right female breast: C50.911

## 2017-09-23 HISTORY — DX: Low back pain: M54.5

## 2017-09-23 HISTORY — PX: LATISSIMUS FLAP TO BREAST: SHX5357

## 2017-09-23 HISTORY — DX: Intraductal carcinoma in situ of left breast: D05.12

## 2017-09-23 SURGERY — RECONSTRUCTION, BREAST, USING LATISSIMUS DORSI MYOCUTANEOUS FLAP
Anesthesia: General | Site: Breast | Laterality: Right

## 2017-09-23 MED ORDER — HYDROCODONE-ACETAMINOPHEN 5-325 MG PO TABS: 1.0000 | ORAL_TABLET | ORAL | Status: DC | PRN

## 2017-09-23 MED ORDER — ROCURONIUM BROMIDE 100 MG/10ML IV SOLN
INTRAVENOUS | Status: DC | PRN
  Administered 2017-09-23: 20 mg via INTRAVENOUS
  Administered 2017-09-23: 80 mg via INTRAVENOUS

## 2017-09-23 MED ORDER — SUCCINYLCHOLINE CHLORIDE 200 MG/10ML IV SOSY
PREFILLED_SYRINGE | INTRAVENOUS | Status: AC
  Filled 2017-09-23: qty 10

## 2017-09-23 MED ORDER — ONDANSETRON HCL 4 MG/2ML IJ SOLN: 4.0000 mg | Freq: Four times a day (QID) | INTRAMUSCULAR | Status: DC | PRN

## 2017-09-23 MED ORDER — HYDROMORPHONE HCL 2 MG/ML IJ SOLN: 1.0000 mg | INTRAMUSCULAR | Status: DC | PRN

## 2017-09-23 MED ORDER — FENTANYL CITRATE (PF) 250 MCG/5ML IJ SOLN
INTRAMUSCULAR | Status: AC
Start: 2017-09-23 — End: ?
  Filled 2017-09-23: qty 5

## 2017-09-23 MED ORDER — PHENYLEPHRINE HCL 10 MG/ML IJ SOLN
INTRAMUSCULAR | Status: DC | PRN
  Administered 2017-09-23: 40 ug via INTRAVENOUS
  Administered 2017-09-23: 80 ug via INTRAVENOUS

## 2017-09-23 MED ORDER — DIPHENHYDRAMINE HCL 50 MG/ML IJ SOLN
INTRAMUSCULAR | Status: DC | PRN
  Administered 2017-09-23: 12.5 mg via INTRAVENOUS

## 2017-09-23 MED ORDER — FENTANYL CITRATE (PF) 100 MCG/2ML IJ SOLN
INTRAMUSCULAR | Status: DC | PRN
  Administered 2017-09-23 (×2): 50 ug via INTRAVENOUS
  Administered 2017-09-23: 100 ug via INTRAVENOUS
  Administered 2017-09-23 (×2): 50 ug via INTRAVENOUS

## 2017-09-23 MED ORDER — FENTANYL CITRATE (PF) 100 MCG/2ML IJ SOLN
100.0000 ug | Freq: Once | INTRAMUSCULAR | Status: DC
Start: 2017-09-23 — End: 2017-09-23

## 2017-09-23 MED ORDER — SUGAMMADEX SODIUM 200 MG/2ML IV SOLN
INTRAVENOUS | Status: DC | PRN
  Administered 2017-09-23 (×2): 100 mg via INTRAVENOUS

## 2017-09-23 MED ORDER — ONDANSETRON 4 MG PO TBDP: 4.0000 mg | ORAL_TABLET | Freq: Four times a day (QID) | ORAL | Status: DC | PRN

## 2017-09-23 MED ORDER — EVICEL 5 ML EX KIT
PACK | CUTANEOUS | Status: AC
  Filled 2017-09-23: qty 1

## 2017-09-23 MED ORDER — SCOPOLAMINE 1 MG/3DAYS TD PT72
MEDICATED_PATCH | TRANSDERMAL | Status: AC
  Administered 2017-09-23: 1.5 mg via TRANSDERMAL
  Filled 2017-09-23: qty 1

## 2017-09-23 MED ORDER — ONDANSETRON HCL 4 MG/2ML IJ SOLN
INTRAMUSCULAR | Status: AC
  Filled 2017-09-23: qty 2

## 2017-09-23 MED ORDER — ZOLPIDEM TARTRATE 5 MG PO TABS: 5.0000 mg | ORAL_TABLET | Freq: Every evening | ORAL | Status: DC | PRN

## 2017-09-23 MED ORDER — ONDANSETRON HCL 4 MG/2ML IJ SOLN
INTRAMUSCULAR | Status: DC | PRN
  Administered 2017-09-23 (×2): 4 mg via INTRAVENOUS

## 2017-09-23 MED ORDER — PROPOFOL 10 MG/ML IV BOLUS
INTRAVENOUS | Status: DC | PRN
  Administered 2017-09-23: 180 mg via INTRAVENOUS

## 2017-09-23 MED ORDER — PROMETHAZINE HCL 25 MG/ML IJ SOLN: 6.2500 mg | INTRAMUSCULAR | Status: DC | PRN

## 2017-09-23 MED ORDER — BUPIVACAINE-EPINEPHRINE 0.25% -1:200000 IJ SOLN
INTRAMUSCULAR | Status: DC | PRN
  Administered 2017-09-23: 9 mL
  Administered 2017-09-23: 3 mL

## 2017-09-23 MED ORDER — LACTATED RINGERS IV SOLN
INTRAVENOUS | Status: DC
  Administered 2017-09-23 (×2): via INTRAVENOUS

## 2017-09-23 MED ORDER — PROPOFOL 10 MG/ML IV BOLUS
INTRAVENOUS | Status: AC
  Filled 2017-09-23: qty 20

## 2017-09-23 MED ORDER — ROCURONIUM BROMIDE 10 MG/ML (PF) SYRINGE
PREFILLED_SYRINGE | INTRAVENOUS | Status: AC
  Filled 2017-09-23: qty 5

## 2017-09-23 MED ORDER — EPHEDRINE SULFATE 50 MG/ML IJ SOLN
INTRAMUSCULAR | Status: DC | PRN
  Administered 2017-09-23 (×2): 5 mg via INTRAVENOUS

## 2017-09-23 MED ORDER — DIPHENHYDRAMINE HCL 50 MG/ML IJ SOLN: 12.5000 mg | Freq: Four times a day (QID) | INTRAMUSCULAR | Status: DC | PRN

## 2017-09-23 MED ORDER — BUPIVACAINE-EPINEPHRINE (PF) 0.25% -1:200000 IJ SOLN
INTRAMUSCULAR | Status: AC
  Filled 2017-09-23: qty 30

## 2017-09-23 MED ORDER — DIAZEPAM 2 MG PO TABS: 2.0000 mg | ORAL_TABLET | Freq: Two times a day (BID) | ORAL | Status: DC | PRN

## 2017-09-23 MED ORDER — MIDAZOLAM HCL 2 MG/2ML IJ SOLN: 2.0000 mg | Freq: Once | INTRAMUSCULAR | Status: DC

## 2017-09-23 MED ORDER — 0.9 % SODIUM CHLORIDE (POUR BTL) OPTIME
TOPICAL | Status: DC | PRN
  Administered 2017-09-23 (×2): 1000 mL

## 2017-09-23 MED ORDER — FENTANYL CITRATE (PF) 250 MCG/5ML IJ SOLN
INTRAMUSCULAR | Status: AC
  Filled 2017-09-23: qty 5

## 2017-09-23 MED ORDER — ONDANSETRON HCL 4 MG/2ML IJ SOLN
INTRAMUSCULAR | Status: AC
  Filled 2017-09-23: qty 4

## 2017-09-23 MED ORDER — DEXAMETHASONE SODIUM PHOSPHATE 10 MG/ML IJ SOLN
INTRAMUSCULAR | Status: AC
  Filled 2017-09-23: qty 1

## 2017-09-23 MED ORDER — PHENYLEPHRINE 40 MCG/ML (10ML) SYRINGE FOR IV PUSH (FOR BLOOD PRESSURE SUPPORT)
PREFILLED_SYRINGE | INTRAVENOUS | Status: AC
  Filled 2017-09-23: qty 10

## 2017-09-23 MED ORDER — SODIUM CHLORIDE 0.9 % IV SOLN
INTRAVENOUS | Status: AC
  Filled 2017-09-23 (×2): qty 500000

## 2017-09-23 MED ORDER — ALBUMIN HUMAN 5 % IV SOLN
INTRAVENOUS | Status: DC | PRN
  Administered 2017-09-23: 16:00:00 via INTRAVENOUS

## 2017-09-23 MED ORDER — SENNA 8.6 MG PO TABS
1.0000 | ORAL_TABLET | Freq: Two times a day (BID) | ORAL | Status: DC
  Administered 2017-09-24: 8.6 mg via ORAL
  Filled 2017-09-23 (×3): qty 1

## 2017-09-23 MED ORDER — MIDAZOLAM HCL 5 MG/5ML IJ SOLN
INTRAMUSCULAR | Status: DC | PRN
  Administered 2017-09-23: 2 mg via INTRAVENOUS

## 2017-09-23 MED ORDER — ACETAMINOPHEN 500 MG PO TABS
500.0000 mg | ORAL_TABLET | Freq: Four times a day (QID) | ORAL | Status: DC
  Administered 2017-09-23 – 2017-09-25 (×4): 500 mg via ORAL
  Filled 2017-09-23 (×5): qty 1

## 2017-09-23 MED ORDER — SUGAMMADEX SODIUM 200 MG/2ML IV SOLN
INTRAVENOUS | Status: AC
  Filled 2017-09-23: qty 2

## 2017-09-23 MED ORDER — POLYETHYLENE GLYCOL 3350 17 G PO PACK: 17.0000 g | PACK | Freq: Every day | ORAL | Status: DC | PRN

## 2017-09-23 MED ORDER — MIDAZOLAM HCL 2 MG/2ML IJ SOLN
INTRAMUSCULAR | Status: AC
  Filled 2017-09-23: qty 2

## 2017-09-23 MED ORDER — DEXAMETHASONE SODIUM PHOSPHATE 10 MG/ML IJ SOLN
INTRAMUSCULAR | Status: DC | PRN
  Administered 2017-09-23: 10 mg via INTRAVENOUS

## 2017-09-23 MED ORDER — LIDOCAINE HCL (CARDIAC) PF 100 MG/5ML IV SOSY
PREFILLED_SYRINGE | INTRAVENOUS | Status: DC | PRN
  Administered 2017-09-23: 100 mg via INTRAVENOUS

## 2017-09-23 MED ORDER — CIPROFLOXACIN IN D5W 400 MG/200ML IV SOLN
INTRAVENOUS | Status: AC
  Filled 2017-09-23: qty 200

## 2017-09-23 MED ORDER — IBUPROFEN 600 MG PO TABS
600.0000 mg | ORAL_TABLET | Freq: Four times a day (QID) | ORAL | Status: DC | PRN
  Administered 2017-09-23: 600 mg via ORAL
  Filled 2017-09-23: qty 1

## 2017-09-23 MED ORDER — CIPROFLOXACIN IN D5W 400 MG/200ML IV SOLN
400.0000 mg | INTRAVENOUS | Status: AC
  Administered 2017-09-23: 400 mg via INTRAVENOUS

## 2017-09-23 MED ORDER — CEFAZOLIN SODIUM-DEXTROSE 2-4 GM/100ML-% IV SOLN
2.0000 g | Freq: Three times a day (TID) | INTRAVENOUS | Status: DC
  Administered 2017-09-23 – 2017-09-25 (×5): 2 g via INTRAVENOUS
  Filled 2017-09-23 (×6): qty 100

## 2017-09-23 MED ORDER — FENTANYL CITRATE (PF) 100 MCG/2ML IJ SOLN: 25.0000 ug | INTRAMUSCULAR | Status: DC | PRN

## 2017-09-23 MED ORDER — DIPHENHYDRAMINE HCL 12.5 MG/5ML PO ELIX: 12.5000 mg | ORAL_SOLUTION | Freq: Four times a day (QID) | ORAL | Status: DC | PRN

## 2017-09-23 MED ORDER — SCOPOLAMINE 1 MG/3DAYS TD PT72
1.0000 | MEDICATED_PATCH | TRANSDERMAL | Status: DC
  Administered 2017-09-23: 1.5 mg via TRANSDERMAL

## 2017-09-23 MED ORDER — EVICEL 5 ML EX KIT
PACK | CUTANEOUS | Status: DC | PRN
  Administered 2017-09-23: 5 mL

## 2017-09-23 MED ORDER — KCL IN DEXTROSE-NACL 20-5-0.45 MEQ/L-%-% IV SOLN
INTRAVENOUS | Status: DC
  Administered 2017-09-23 – 2017-09-24 (×2): via INTRAVENOUS
  Filled 2017-09-23 (×2): qty 1000

## 2017-09-23 SURGICAL SUPPLY — 66 items
ADH SKN CLS APL DERMABOND .7 (GAUZE/BANDAGES/DRESSINGS) ×2
APPLIER CLIP 9.375 MED OPEN (MISCELLANEOUS) ×3
APR CLP MED 9.3 20 MLT OPN (MISCELLANEOUS) ×2
BAG DECANTER FOR FLEXI CONT (MISCELLANEOUS) ×3 IMPLANT
BINDER BREAST XXLRG (GAUZE/BANDAGES/DRESSINGS) ×2 IMPLANT
BIOPATCH RED 1 DISK 7.0 (GAUZE/BANDAGES/DRESSINGS) ×8 IMPLANT
BLADE 10 SAFETY STRL DISP (BLADE) ×3 IMPLANT
BLADE SURG 10 STRL SS (BLADE) ×3 IMPLANT
BLADE SURG 15 STRL LF DISP TIS (BLADE) ×2 IMPLANT
BLADE SURG 15 STRL SS (BLADE) ×3
CANISTER SUCT 3000ML PPV (MISCELLANEOUS) ×3 IMPLANT
CHLORAPREP W/TINT 26ML (MISCELLANEOUS) ×3 IMPLANT
CLIP APPLIE 9.375 MED OPEN (MISCELLANEOUS) ×2 IMPLANT
COVER SURGICAL LIGHT HANDLE (MISCELLANEOUS) ×3 IMPLANT
DECANTER SPIKE VIAL GLASS SM (MISCELLANEOUS) ×3 IMPLANT
DERMABOND ADVANCED (GAUZE/BANDAGES/DRESSINGS) ×1
DERMABOND ADVANCED .7 DNX12 (GAUZE/BANDAGES/DRESSINGS) ×2 IMPLANT
DRAIN CHANNEL 19F RND (DRAIN) ×8 IMPLANT
DRAPE HALF SHEET 40X57 (DRAPES) ×6 IMPLANT
DRAPE ORTHO SPLIT 77X108 STRL (DRAPES) ×12
DRAPE SURG 17X23 STRL (DRAPES) ×12 IMPLANT
DRAPE SURG ORHT 6 SPLT 77X108 (DRAPES) ×6 IMPLANT
DRAPE WARM FLUID 44X44 (DRAPE) ×3 IMPLANT
DRSG MEPILEX BORDER 4X8 (GAUZE/BANDAGES/DRESSINGS) ×3 IMPLANT
DRSG PAD ABDOMINAL 8X10 ST (GAUZE/BANDAGES/DRESSINGS) ×2 IMPLANT
ELECT BLADE 4.0 EZ CLEAN MEGAD (MISCELLANEOUS) ×3
ELECT BLADE 6.5 EXT (BLADE) ×2 IMPLANT
ELECT CAUTERY BLADE 6.4 (BLADE) ×6 IMPLANT
ELECT REM PT RETURN 9FT ADLT (ELECTROSURGICAL) ×3
ELECTRODE BLDE 4.0 EZ CLN MEGD (MISCELLANEOUS) ×2 IMPLANT
ELECTRODE REM PT RTRN 9FT ADLT (ELECTROSURGICAL) ×2 IMPLANT
EVACUATOR SILICONE 100CC (DRAIN) ×8 IMPLANT
GAUZE SPONGE 4X4 12PLY STRL (GAUZE/BANDAGES/DRESSINGS) ×3 IMPLANT
GLOVE BIO SURGEON STRL SZ 6.5 (GLOVE) ×10 IMPLANT
GLOVE BIOGEL PI IND STRL 6.5 (GLOVE) ×1 IMPLANT
GLOVE BIOGEL PI IND STRL 7.0 (GLOVE) ×1 IMPLANT
GLOVE BIOGEL PI INDICATOR 6.5 (GLOVE) ×1
GLOVE BIOGEL PI INDICATOR 7.0 (GLOVE) ×1
GLOVE SURG SS PI 6.5 STRL IVOR (GLOVE) ×2 IMPLANT
GOWN STRL REUS W/ TWL LRG LVL3 (GOWN DISPOSABLE) ×8 IMPLANT
GOWN STRL REUS W/TWL LRG LVL3 (GOWN DISPOSABLE) ×12
KIT BASIN OR (CUSTOM PROCEDURE TRAY) ×3 IMPLANT
KIT TURNOVER KIT B (KITS) ×3 IMPLANT
NDL INFUSION SET 21GA (NEEDLE) IMPLANT
NEEDLE 22X1 1/2 (OR ONLY) (NEEDLE) ×3 IMPLANT
NEEDLE INFUSION SET 21GA (NEEDLE) ×3 IMPLANT
NS IRRIG 1000ML POUR BTL (IV SOLUTION) ×6 IMPLANT
PACK GENERAL/GYN (CUSTOM PROCEDURE TRAY) ×3 IMPLANT
PAD ABD 8X10 STRL (GAUZE/BANDAGES/DRESSINGS) ×2 IMPLANT
PAD ARMBOARD 7.5X6 YLW CONV (MISCELLANEOUS) ×9 IMPLANT
PIN SAFETY STERILE (MISCELLANEOUS) ×3 IMPLANT
SET ASEPTIC TRANSFER (MISCELLANEOUS) IMPLANT
SPONGE LAP 18X18 X RAY DECT (DISPOSABLE) ×2 IMPLANT
STAPLER VISISTAT 35W (STAPLE) ×3 IMPLANT
STOPCOCK 4 WAY LG BORE MALE ST (IV SETS) IMPLANT
STRIP CLOSURE SKIN 1/2X4 (GAUZE/BANDAGES/DRESSINGS) IMPLANT
SUT ETHILON 2 0 FS 18 (SUTURE) ×8 IMPLANT
SUT MNCRL AB 3-0 PS2 18 (SUTURE) ×4 IMPLANT
SUT MNCRL AB 4-0 PS2 18 (SUTURE) ×6 IMPLANT
SUT MON AB 5-0 PS2 18 (SUTURE) ×6 IMPLANT
SUT VIC AB 3-0 SH 8-18 (SUTURE) ×6 IMPLANT
SYR 50ML LL SCALE MARK (SYRINGE) ×2 IMPLANT
SYR CONTROL 10ML LL (SYRINGE) ×3 IMPLANT
TOWEL GREEN STERILE (TOWEL DISPOSABLE) ×3 IMPLANT
TRAY FOLEY MTR SLVR 14FR STAT (SET/KITS/TRAYS/PACK) ×2 IMPLANT
TUBE CONNECTING 12X1/4 (SUCTIONS) ×3 IMPLANT

## 2017-09-23 NOTE — Anesthesia Procedure Notes (Signed)
Procedure Name: Intubation Date/Time: 09/23/2017 2:08 PM Performed by: Shirlyn Goltz, CRNA Pre-anesthesia Checklist: Patient identified, Emergency Drugs available, Patient being monitored and Suction available Patient Re-evaluated:Patient Re-evaluated prior to induction Oxygen Delivery Method: Circle system utilized Preoxygenation: Pre-oxygenation with 100% oxygen Induction Type: IV induction Ventilation: Mask ventilation without difficulty Laryngoscope Size: Mac and 3 Grade View: Grade II Tube type: Oral Tube size: 7.0 mm Number of attempts: 1 Airway Equipment and Method: Stylet Placement Confirmation: ETT inserted through vocal cords under direct vision,  positive ETCO2 and breath sounds checked- equal and bilateral Secured at: 21 cm Tube secured with: Tape Dental Injury: Teeth and Oropharynx as per pre-operative assessment

## 2017-09-23 NOTE — Anesthesia Preprocedure Evaluation (Signed)
Anesthesia Evaluation  Patient identified by MRN, date of birth, ID band Patient awake    Reviewed: Allergy & Precautions, NPO status , Patient's Chart, lab work & pertinent test results  History of Anesthesia Complications Negative for: history of anesthetic complications  Airway Mallampati: II  TM Distance: >3 FB Neck ROM: Full    Dental no notable dental hx. (+) Dental Advisory Given   Pulmonary sleep apnea ,    Pulmonary exam normal        Cardiovascular hypertension, Pt. on medications Normal cardiovascular exam     Neuro/Psych negative neurological ROS  negative psych ROS   GI/Hepatic negative GI ROS, Neg liver ROS,   Endo/Other  Hypothyroidism   Renal/GU      Musculoskeletal   Abdominal   Peds  Hematology   Anesthesia Other Findings   Reproductive/Obstetrics                             Anesthesia Physical Anesthesia Plan  ASA: II  Anesthesia Plan: General   Post-op Pain Management:    Induction: Intravenous  PONV Risk Score and Plan: 4 or greater and Ondansetron, Dexamethasone, Scopolamine patch - Pre-op and Diphenhydramine  Airway Management Planned: Oral ETT  Additional Equipment:   Intra-op Plan:   Post-operative Plan: Extubation in OR  Informed Consent: I have reviewed the patients History and Physical, chart, labs and discussed the procedure including the risks, benefits and alternatives for the proposed anesthesia with the patient or authorized representative who has indicated his/her understanding and acceptance.   Dental advisory given  Plan Discussed with: CRNA and Anesthesiologist  Anesthesia Plan Comments:         Anesthesia Quick Evaluation

## 2017-09-23 NOTE — Interval H&P Note (Signed)
History and Physical Interval Note:  09/23/2017 1:06 PM  LANELLE LINDO  has presented today for surgery, with the diagnosis of Acquired absence of bilateral breasts and nipples, History of breast cancer  The various methods of treatment have been discussed with the patient and family. After consideration of risks, benefits and other options for treatment, the patient has consented to  Procedure(s): RIGHT BREAST LATISSIMUS DORSI FLAP RECONSTRUCTION WITH TISSUE EXPANDER VS IMPLANT PLACEMENT (Right) AND REMOVAL OF LEFT BREAST TISSUE EXPANDER AND PLACEMENT OF LEFT BREAST SILICONE IMPLANT (Left) as a surgical intervention .  The patient's history has been reviewed, patient examined, no change in status, stable for surgery.  I have reviewed the patient's chart and labs.  Questions were answered to the patient's satisfaction.     [name] Shreeya Recendiz

## 2017-09-23 NOTE — Transfer of Care (Signed)
Immediate Anesthesia Transfer of Care Note  Patient: [name]  Procedure(s) Performed: RIGHT BREAST LATISSIMUS DORSI FLAP RECONSTRUCTION WITH TISSUE EXPANDER (Right Breast)  Patient Location: PACU  Anesthesia Type:General  Level of Consciousness: drowsy and responds to stimulation  Airway & Oxygen Therapy: Patient Spontanous Breathing  Post-op Assessment: Report given to RN and Post -op Vital signs reviewed and stable  Post vital signs: Reviewed and stable  Last Vitals:  Vitals Value Taken Time  BP 99/76 09/23/2017  5:46 PM  Temp    Pulse 94 09/23/2017  5:49 PM  Resp 15 09/23/2017  5:49 PM  SpO2 91 % 09/23/2017  5:49 PM  Vitals shown include unvalidated device data.  Last Pain:  Vitals:   09/23/17 1000  TempSrc: Oral  PainSc: 0-No pain      Patients Stated Pain Goal: 8 (68/37/29 0211)  Complications: No apparent anesthesia complications

## 2017-09-23 NOTE — Op Note (Addendum)
DATE OF OPERATION: 09/23/2017  LOCATION: [facility] Operating Room  PREOPERATIVE DIAGNOSIS: Breast Cancer s/p right mastectomy  POSTOPERATIVE DIAGNOSIS: Same  PROCEDURE: 1. Latissimus myocutaneous flap to reconstruct the right breast CPT 19361 (200 cc of saline removed from expander)  SURGEON: Narda Fundora Sanger Marlow Hendrie, DO  EBL: 150 cc  SPECIMEN: None  DRAINS: 3 total 66 blake round drains  CONDITION: Stable  COMPLICATIONS: None  INDICATION: The patient, [name], is a 67 y.o. female born on 1951-04-02, is here for treatment after a right mastectomy.    PROCEDURE DETAILS:  The patient was seen on the morning of her surgery and marked out for her flap.  She was given an IV and IV antibiotics. She was then taken to the operating room and given a general anesthetic. A standard time out was performed and all information was confirmed by those in the room. She has SCD's and a foley catheter placed. She was placed into the left lateral decubitus position with all key points padded. She was then prepped and draped in the standard sterile fashion using a chloroprep. The paddle design and position was confirmed. The procedure began by incising the margins of the paddle and dissecting out until all four margins of the muscle were identified. The muscle was then released inferiorly and anteriorly and care was taken not to pick up the serratus anteriorly or the paraspinous muscles posteriorly. The flap was then raised to the scapula and released and rotated medially. Care was taken to protect the vascular pedicle throughout this portion of the procedure. The paddle and muscle looked healthy throughout the case.  The old mastectomy scar was excised and the flaps on top of the pectoralis muscle were raised.  The posterior and anterior pockets were then connected in the plane above the muscle. The muscle from the back and the skin paddle were then rotated into the chest pocket and covered  with an ioban dressing. The flap pedicle was inspected and there was no tension. The back pocket was hemostased and Evicel placed. Two #19 blake round drains were place and secured with 3-0 Nylon. The back incision was closed with buried 3-0 Vicryl, followed by 4-0 Monocryl and 5-0 Monocryl.  Dermabond and a protective dressing was applied.   The patient was then repositioned onto her back and the chest was prepped and draped. The breast pocket was inspected and hemostases was achieved with electrocautery. The expander was tight and therefore 200 cc of saline was removed. It was left in place. The muscle was then secured superiorly to the pectoralis muscle with 3-0 Vicryl.  The inferior portion of the muscle was tacked to the inframammary fold with 3-0 Vicryl.  One drain was placed on this side and secured with 3-0 Nylon. The flap was then closed with 3-0 Monocryl deep, followed by 4-0 Monocryl and the skin closed with 5-0 Monocryl.  Dermabond, ABDs and a breast binder was applied.    The patient was allowed to wake up and taken to recovery room in stable condition at the end of the case. The family was notified at the end of the case.

## 2017-09-23 NOTE — Progress Notes (Signed)
Patient transferred to 6N14, receiving RN at bedside, no questions from RN or patient, family notified of patients room.  Rowe Pavy, RN

## 2017-09-23 NOTE — Progress Notes (Signed)
Admitted to 6N14 post mastectomy, drains intact, Oriented to unit and staff. Will endorse appropriately.

## 2017-09-24 ENCOUNTER — Other Ambulatory Visit: Payer: Self-pay

## 2017-09-24 ENCOUNTER — Encounter (HOSPITAL_COMMUNITY): Payer: Self-pay | Admitting: Plastic Surgery

## 2017-09-24 LAB — BASIC METABOLIC PANEL
Anion gap: 7 (ref 5–15)
BUN: 10 mg/dL (ref 6–20)
CALCIUM: 8.3 mg/dL — AB (ref 8.9–10.3)
CO2: 24 mmol/L (ref 22–32)
Chloride: 108 mmol/L (ref 101–111)
Creatinine, Ser: 1.13 mg/dL — ABNORMAL HIGH (ref 0.44–1.00)
GFR calc Af Amer: 57 mL/min — ABNORMAL LOW (ref >60)
GFR calc non Af Amer: 50 mL/min — ABNORMAL LOW (ref >60)
GLUCOSE: 252 mg/dL — AB (ref 65–99)
Potassium: 3.8 mmol/L (ref 3.5–5.1)
Sodium: 139 mmol/L (ref 135–145)

## 2017-09-24 LAB — CBC
HEMATOCRIT: 35.2 % — AB (ref 36.0–46.0)
Hemoglobin: 11.3 g/dL — ABNORMAL LOW (ref 12.0–15.0)
MCH: 28.2 pg (ref 26.0–34.0)
MCHC: 32.1 g/dL (ref 30.0–36.0)
MCV: 87.8 fL (ref 78.0–100.0)
Platelets: 283 10*3/uL (ref 150–400)
RBC: 4.01 MIL/uL (ref 3.87–5.11)
RDW: 12.1 % (ref 11.5–15.5)
WBC: 13.4 10*3/uL — ABNORMAL HIGH (ref 4.0–10.5)

## 2017-09-24 LAB — GLUCOSE, CAPILLARY
GLUCOSE-CAPILLARY: 151 mg/dL — AB (ref 65–99)
GLUCOSE-CAPILLARY: 151 mg/dL — AB (ref 65–99)
GLUCOSE-CAPILLARY: 111 mg/dL — AB (ref 65–99)

## 2017-09-24 LAB — HIV ANTIBODY (ROUTINE TESTING W REFLEX): HIV Screen 4th Generation wRfx: NONREACTIVE

## 2017-09-24 MED ORDER — INSULIN ASPART 100 UNIT/ML ~~LOC~~ SOLN: 0.0000 [IU] | Freq: Three times a day (TID) | SUBCUTANEOUS | Status: DC

## 2017-09-24 MED ORDER — PROPOFOL 10 MG/ML IV BOLUS
INTRAVENOUS | Status: AC
  Filled 2017-09-24: qty 20

## 2017-09-24 NOTE — Anesthesia Postprocedure Evaluation (Signed)
Anesthesia Post Note  Patient: [name]  Procedure(s) Performed: RIGHT BREAST LATISSIMUS DORSI FLAP RECONSTRUCTION WITH TISSUE EXPANDER (Right Breast)     Patient location during evaluation: PACU Anesthesia Type: General Level of consciousness: awake and alert Pain management: pain level controlled Vital Signs Assessment: post-procedure vital signs reviewed and stable Respiratory status: spontaneous breathing, nonlabored ventilation, respiratory function stable and patient connected to nasal cannula oxygen Cardiovascular status: blood pressure returned to baseline and stable Postop Assessment: no apparent nausea or vomiting Anesthetic complications: no    Last Vitals:  Vitals:   09/24/17 1015 09/24/17 1329  BP: 123/65 113/61  Pulse: 91 83  Resp: 18 16  Temp: 37.1 C 37.1 C  SpO2: 100% 100%    Last Pain:  Vitals:   09/24/17 1329  TempSrc: Oral  PainSc:                  [name]

## 2017-09-24 NOTE — Progress Notes (Signed)
1 Day Post-Op   Subjective/Chief Complaint: Reports she is doing well overall. Pain over the back is not severe. Getting up to bathroom with assistance.  Glucose running high on lab this am, so will check CBG's. No history of DM.   Right breast/LD flap is viable and without signs of hematoma, infection or seroma. JP drains x 3 with expected serosanguinous drainage. Incisions clean, dry and intact.    Objective: Vital signs in last 24 hours: Temp:  [97 F (36.1 C)-99 F (37.2 C)] 98.8 F (37.1 C) (05/30 1329) Pulse Rate:  [83-109] 83 (05/30 1329) Resp:  [13-19] 16 (05/30 1329) BP: (99-152)/(55-107) 113/61 (05/30 1329) SpO2:  [89 %-100 %] 100 % (05/30 1329) Last BM Date: 09/22/17  Intake/Output from previous day: 05/29 0701 - 05/30 0700 In: 3440 [P.O.:240; I.V.:2700; IV Piggyback:500] Out: 1180 [Urine:780; Drains:200; Blood:200] Intake/Output this shift: Total I/O In: 120 [P.O.:120] Out: 100 [Drains:100]    Lab Results:  Recent Labs    09/24/17 0454  WBC 13.4*  HGB 11.3*  HCT 35.2*  PLT 283   BMET Recent Labs    09/24/17 0454  NA 139  K 3.8  CL 108  CO2 24  GLUCOSE 252*  BUN 10  CREATININE 1.13*  CALCIUM 8.3*   PT/INR No results for input(s): LABPROT, INR in the last 72 hours. ABG No results for input(s): PHART, HCO3 in the last 72 hours.  Invalid input(s): PCO2, PO2  Studies/Results: No results found.  Anti-infectives: Anti-infectives (From admission, onward)   Start     Dose/Rate Route Frequency Ordered Stop   09/23/17 2200  ceFAZolin (ANCEF) IVPB 2g/100 mL premix     2 g 200 mL/hr over 30 Minutes Intravenous Every 8 hours 09/23/17 1913     09/23/17 1000  ciprofloxacin (CIPRO) IVPB 400 mg     400 mg 200 mL/hr over 60 Minutes Intravenous On call to O.R. 09/23/17 0946 09/23/17 1517   09/23/17 0953  ciprofloxacin (CIPRO) 400 MG/200ML IVPB    Note to Pharmacy:  Ernesta Amble   : cabinet override      09/23/17 0953 09/23/17 1417       Assessment/Plan: s/p Procedure(s): RIGHT BREAST LATISSIMUS DORSI FLAP RECONSTRUCTION WITH TISSUE EXPANDER (Right) Continue to mobilize OOB.  Monitor CBG Will saline lock IVF Plan home in am if continues to do well.   LOS: 1 day   [facility] Plastic Surgery [phone]

## 2017-09-25 ENCOUNTER — Other Ambulatory Visit (HOSPITAL_COMMUNITY): Payer: Medicare Other

## 2017-09-25 LAB — GLUCOSE, CAPILLARY: Glucose-Capillary: 108 mg/dL — ABNORMAL HIGH (ref 65–99)

## 2017-09-25 NOTE — Discharge Summary (Signed)
Physician Discharge Summary  Patient ID: [name] MRN: [id number] DOB/AGE: [birth year] 67 y.o.  Admit date: 09/23/2017 Discharge date: 09/25/2017  Admission Diagnoses:  Discharge Diagnoses:  Active Problems:   Acquired absence of right breast   Discharged Condition: good  Hospital Course: The patient was admitted and taken to the Operating room for a right latissimus myocutaneous flap for breast reconstruction.  She did very well and was managed on the surgery unit.  She was walking, eating and pain well controlled.  She was ready for discharge.  Consults: None  Significant Diagnostic Studies: none  Treatments: surgery  Discharge Exam: Blood pressure 125/60, pulse 67, temperature 97.7 F (36.5 C), temperature source Oral, resp. rate 17, height 5\' 4"  (1.626 m), weight 99.7 kg (219 lb 11.2 oz), SpO2 99 %. General appearance: alert, cooperative and no distress Breasts: normal appearance, no masses or tenderness Incision/Wound: No redness or sign of seroma or hematoma.  Pain well controlled.  Drains working well. Disposition: Discharge disposition: 01-Home or Self Care       Discharge Instructions    Call MD for:  difficulty breathing, headache or visual disturbances   Complete by:  As directed    Call MD for:  hives   Complete by:  As directed    Call MD for:  redness, tenderness, or signs of infection (pain, swelling, redness, odor or green/yellow discharge around incision site)   Complete by:  As directed    Call MD for:  severe uncontrolled pain   Complete by:  As directed    Call MD for:  temperature >100.4   Complete by:  As directed    Diet general   Complete by:  As directed    Discharge wound care:   Complete by:  As directed    Drain care   Driving Restrictions   Complete by:  As directed    No driving while on pain meds   Increase activity slowly   Complete by:  As directed    Lifting restrictions   Complete by:  As directed    No heavy lifting      Allergies as of 09/25/2017   No Active Allergies     Medication List    TAKE these medications   cholecalciferol 1000 units tablet Commonly known as:  VITAMIN D Take 1,000 Units by mouth daily.   diazepam 2 MG tablet Commonly known as:  VALIUM Take 1 tablet (2 mg total) by mouth every 12 (twelve) hours as needed for muscle spasms.   HYDROcodone-acetaminophen 5-325 MG tablet Commonly known as:  NORCO/VICODIN Take 1-2 tablets by mouth every 4 (four) hours as needed for moderate pain.   levothyroxine 88 MCG tablet Commonly known as:  SYNTHROID, LEVOTHROID Take 88 mcg by mouth daily before breakfast.   Olmesartan-amLODIPine-HCTZ 20-5-12.5 MG Tabs Take 1 tablet by mouth daily.   ondansetron 4 MG disintegrating tablet Commonly known as:  ZOFRAN-ODT Take 1 tablet (4 mg total) by mouth every 6 (six) hours as needed for nausea.   senna 8.6 MG Tabs tablet Commonly known as:  SENOKOT Take 1 tablet (8.6 mg total) by mouth 2 (two) times daily.   tamoxifen 20 MG tablet Commonly known as:  NOLVADEX Take 1 tablet (20 mg total) by mouth daily.            Discharge Care Instructions  (From admission, onward)        Start     Ordered   09/25/17 0000  Discharge wound  care:    Comments:  Drain care   09/25/17 1115     Follow-up Information    Nur Rabold, [name], DO In 1 week.   Specialty:  Plastic Surgery Contact information: [place] [address] [phone]           Signed: Wallace Going 09/25/2017, 6:42 AM

## 2017-09-25 NOTE — Consult Note (Signed)
[facility] CM Primary Care Navigator  09/25/2017  [name] 20-Feb-1951 [id number]   Met withpatient and friend (Sylvia)at the bedsideto identify possible discharge needs. Permission obtained from patient to discuss health information with friend's presence.  Per MD note, patientwasadmitted for a right latissimus myocutaneous flap for breast reconstruction with tissue expander. (Acquired absence of bilateral breasts and nipples, history of breast cancer).  Patient endorses [facility].Veita Criss Rosales [facility] asher[facility] care provider.   [facility] [address] [facility] [place] [facility] medications without difficulty.  Patientstates that she has beenmanagingher own medications at home straight out of the containers and uses "pill box" when travelling.  Patient reports that she was driving prior to admission/ surgery, but her close friend- Sunday Spillers will be able to provide transportation to herdoctors'appointments if needed after discharge.  Patient lives alone and reports of being physically active and independent with self care. She has a grandson who comes to visit and checks on her. She mentioned that pre- arrangement was also made prior to admission that her friends Hoyle Sauer and Mariann Laster) besides Sunday Spillers will come and assist with her care when needed.  Anticipateddischarge planishomewith possible outpatient rehab per patient.  Patientvoiced understanding to call primary care provider's officewhen shereturnshomefor a post discharge follow-up visitwithin1- 2weeksor sooner if needs arise.Patient letter (with PCP's contact number) was provided asareminder.   Explained topatient regarding [facility] CM services available for health management at home butshe deniesany needs or concerns for now.Patient hadverbalized understandingto seekreferral from primary care provider to [facility] care management if deemed  necessaryand appropriate for anyservices in the future.  [facility] care management information was provided for future needs thatshe may have.  Patient hadverbally agreedand opted forEMMIcalls tofollowup herrecoveryat home.   Referral made for [facility] General calls after discharge.    For additional questions please contact:  Edwena Felty A. Abryana Lykens, BSN, RN-BC [facility] PRIMARY CARE Navigator Cell: [phone]

## 2017-09-25 NOTE — Progress Notes (Signed)
Pt for discharge today health teaching on how to empty the JP drain and recorded it, discontinued peripheral IV line, next appointment, wound site dry and intact, ambulatory, no complain of pain, waiting for her family to pick her up.

## 2017-09-25 NOTE — Discharge Instructions (Signed)
No heavy lifting. Sink Bath. Continue binder or sports bra.

## 2017-09-28 DIAGNOSIS — I10 Essential (primary) hypertension: Secondary | ICD-10-CM | POA: Diagnosis not present

## 2017-09-28 DIAGNOSIS — D0580 Other specified type of carcinoma in situ of unspecified breast: Secondary | ICD-10-CM | POA: Diagnosis not present

## 2017-10-02 ENCOUNTER — Encounter: Payer: Medicare Other | Admitting: Adult Health

## 2017-10-28 ENCOUNTER — Ambulatory Visit: Payer: Medicare Other | Admitting: Physical Therapy

## 2017-10-30 DIAGNOSIS — N390 Urinary tract infection, site not specified: Secondary | ICD-10-CM | POA: Diagnosis not present

## 2017-10-30 DIAGNOSIS — Z0000 Encounter for general adult medical examination without abnormal findings: Secondary | ICD-10-CM | POA: Diagnosis not present

## 2017-11-02 DIAGNOSIS — L0390 Cellulitis, unspecified: Secondary | ICD-10-CM | POA: Diagnosis not present

## 2017-11-30 ENCOUNTER — Other Ambulatory Visit: Payer: Self-pay

## 2017-12-02 DIAGNOSIS — I950 Idiopathic hypotension: Secondary | ICD-10-CM | POA: Diagnosis not present

## 2017-12-03 ENCOUNTER — Other Ambulatory Visit (HOSPITAL_COMMUNITY): Payer: Self-pay | Admitting: Endocrinology

## 2017-12-03 DIAGNOSIS — C73 Malignant neoplasm of thyroid gland: Secondary | ICD-10-CM

## 2017-12-07 ENCOUNTER — Encounter (HOSPITAL_COMMUNITY)
Admission: RE | Admit: 2017-12-07 | Discharge: 2017-12-07 | Disposition: A | Discharge status: Home / Self Care | Payer: Medicare Other | Source: Ambulatory Visit | Attending: Endocrinology | Admitting: Endocrinology

## 2017-12-07 DIAGNOSIS — C73 Malignant neoplasm of thyroid gland: Secondary | ICD-10-CM | POA: Diagnosis not present

## 2017-12-07 MED ORDER — STERILE WATER FOR INJECTION IJ SOLN
INTRAMUSCULAR | Status: AC
  Administered 2017-12-07: 1.2 mL
  Filled 2017-12-07: qty 10

## 2017-12-07 MED ORDER — THYROTROPIN ALFA 1.1 MG IM SOLR
0.9000 mg | INTRAMUSCULAR | Status: AC
  Administered 2017-12-07: 0.9 mg via INTRAMUSCULAR

## 2017-12-08 ENCOUNTER — Encounter (HOSPITAL_COMMUNITY)
Admission: RE | Admit: 2017-12-08 | Discharge: 2017-12-08 | Disposition: A | Discharge status: Home / Self Care | Payer: Medicare Other | Source: Ambulatory Visit | Attending: Endocrinology | Admitting: Endocrinology

## 2017-12-08 DIAGNOSIS — C73 Malignant neoplasm of thyroid gland: Secondary | ICD-10-CM | POA: Diagnosis not present

## 2017-12-08 MED ORDER — STERILE WATER FOR INJECTION IJ SOLN
INTRAMUSCULAR | Status: AC
  Filled 2017-12-08: qty 10

## 2017-12-08 MED ORDER — THYROTROPIN ALFA 1.1 MG IM SOLR
0.9000 mg | INTRAMUSCULAR | Status: AC
  Administered 2017-12-08: 0.9 mg via INTRAMUSCULAR

## 2017-12-09 ENCOUNTER — Encounter (HOSPITAL_COMMUNITY)
Admission: RE | Admit: 2017-12-09 | Discharge: 2017-12-09 | Disposition: A | Discharge status: Home / Self Care | Payer: Medicare Other | Source: Ambulatory Visit | Attending: Endocrinology | Admitting: Endocrinology

## 2017-12-09 DIAGNOSIS — C73 Malignant neoplasm of thyroid gland: Secondary | ICD-10-CM | POA: Diagnosis not present

## 2017-12-09 MED ORDER — SODIUM IODIDE I 131 CAPSULE
4.2000 | Freq: Once | INTRAVENOUS | Status: AC | PRN
  Administered 2017-12-09: 4.2 via ORAL

## 2017-12-11 ENCOUNTER — Encounter (HOSPITAL_COMMUNITY): Payer: Self-pay | Admitting: *Deleted

## 2017-12-11 ENCOUNTER — Other Ambulatory Visit: Payer: Self-pay

## 2017-12-11 ENCOUNTER — Encounter (HOSPITAL_COMMUNITY)
Admission: RE | Admit: 2017-12-11 | Discharge: 2017-12-11 | Disposition: A | Discharge status: Home / Self Care | Payer: Medicare Other | Source: Ambulatory Visit | Attending: Endocrinology | Admitting: Endocrinology

## 2017-12-11 DIAGNOSIS — I10 Essential (primary) hypertension: Secondary | ICD-10-CM | POA: Diagnosis not present

## 2017-12-11 DIAGNOSIS — R7301 Impaired fasting glucose: Secondary | ICD-10-CM | POA: Diagnosis not present

## 2017-12-11 DIAGNOSIS — C73 Malignant neoplasm of thyroid gland: Secondary | ICD-10-CM | POA: Diagnosis not present

## 2017-12-11 DIAGNOSIS — E039 Hypothyroidism, unspecified: Secondary | ICD-10-CM | POA: Diagnosis not present

## 2017-12-11 MED ORDER — SODIUM IODIDE I 131 CAPSULE
4.0000 | Freq: Once | INTRAVENOUS | Status: AC | PRN
  Administered 2017-12-11: 4 via ORAL

## 2017-12-11 NOTE — Progress Notes (Signed)
Spoke with pt for pre-op call. Pt denies cardiac history. Pt has hx of HTN but has recently had her medication stopped due to have bad headaches and dizziness. Pt states she is not diabetic.

## 2017-12-11 NOTE — H&P (Signed)
Subjective:     Patient ID: [name] is a 67 y.o. female.  HPI Patient of Dr. Marla Roe, nearly 3 months post LD flap over expander that was already in place. Has been having intermittent drainage for a month. Most recently on Bactrim for MSSA until last week for culture done 11/02/17. Restarted this yesterday as noted new areas of puffiness. Denies fevers. Chart review with wound noted that have been healing. No pictures since prior to LD for review.      Objective:   Physical Exam  Cardiovascular: Normal rate, regular rhythm and normal heart sounds.   Pulmonary/Chest: Effort normal and breath sounds normal.  Abdominal: Soft.  Back flat scar maturing Left chest benign expanded R chest with cellulitis and at least three areas of fullness that are consistent with abscess threatened to drain    Assessment:     1. Acquired absence of bilateral breasts and nipples    2. History of breast cancer    3. History of radiation therapy         Plan:      Recommend removal right chest TE and drain placement. Will obtain new cultures intraop. For now continue Bactrim. Will plan tomorrow am.  Irene Limbo, MD [facility] Plastic & Reconstructive Surgery [phone], pin [phone]   Right expander has a total of 490/475 cc  Left expander has a total of 740/475 cc

## 2017-12-12 ENCOUNTER — Ambulatory Visit (HOSPITAL_COMMUNITY)
Admission: RE | Admit: 2017-12-12 | Discharge: 2017-12-12 | Disposition: A | Discharge status: Home / Self Care | Payer: Medicare Other | Source: Ambulatory Visit | Attending: Plastic Surgery | Admitting: Plastic Surgery

## 2017-12-12 ENCOUNTER — Encounter (HOSPITAL_COMMUNITY): Payer: Self-pay | Admitting: *Deleted

## 2017-12-12 ENCOUNTER — Ambulatory Visit (HOSPITAL_COMMUNITY): Payer: Medicare Other | Admitting: Anesthesiology

## 2017-12-12 ENCOUNTER — Encounter (HOSPITAL_COMMUNITY)
Admission: RE | Disposition: A | Discharge status: Home / Self Care | Payer: Self-pay | Source: Ambulatory Visit | Attending: Plastic Surgery

## 2017-12-12 DIAGNOSIS — E039 Hypothyroidism, unspecified: Secondary | ICD-10-CM | POA: Diagnosis not present

## 2017-12-12 DIAGNOSIS — Z923 Personal history of irradiation: Secondary | ICD-10-CM | POA: Insufficient documentation

## 2017-12-12 DIAGNOSIS — I10 Essential (primary) hypertension: Secondary | ICD-10-CM | POA: Diagnosis not present

## 2017-12-12 DIAGNOSIS — Z9013 Acquired absence of bilateral breasts and nipples: Secondary | ICD-10-CM | POA: Diagnosis not present

## 2017-12-12 DIAGNOSIS — Z853 Personal history of malignant neoplasm of breast: Secondary | ICD-10-CM | POA: Insufficient documentation

## 2017-12-12 DIAGNOSIS — Z79890 Hormone replacement therapy (postmenopausal): Secondary | ICD-10-CM | POA: Insufficient documentation

## 2017-12-12 DIAGNOSIS — T8149XA Infection following a procedure, other surgical site, initial encounter: Secondary | ICD-10-CM | POA: Diagnosis not present

## 2017-12-12 DIAGNOSIS — Y838 Other surgical procedures as the cause of abnormal reaction of the patient, or of later complication, without mention of misadventure at the time of the procedure: Secondary | ICD-10-CM | POA: Diagnosis not present

## 2017-12-12 DIAGNOSIS — A4901 Methicillin susceptible Staphylococcus aureus infection, unspecified site: Secondary | ICD-10-CM | POA: Diagnosis not present

## 2017-12-12 DIAGNOSIS — E669 Obesity, unspecified: Secondary | ICD-10-CM | POA: Insufficient documentation

## 2017-12-12 DIAGNOSIS — Z6833 Body mass index (BMI) 33.0-33.9, adult: Secondary | ICD-10-CM | POA: Diagnosis not present

## 2017-12-12 DIAGNOSIS — G4730 Sleep apnea, unspecified: Secondary | ICD-10-CM | POA: Diagnosis not present

## 2017-12-12 DIAGNOSIS — L03313 Cellulitis of chest wall: Secondary | ICD-10-CM | POA: Diagnosis not present

## 2017-12-12 DIAGNOSIS — Z45811 Encounter for adjustment or removal of right breast implant: Secondary | ICD-10-CM | POA: Diagnosis not present

## 2017-12-12 HISTORY — DX: Anemia, unspecified: D64.9

## 2017-12-12 HISTORY — PX: REMOVAL OF TISSUE EXPANDER AND PLACEMENT OF IMPLANT: SHX6457

## 2017-12-12 LAB — BASIC METABOLIC PANEL
Anion gap: 9 (ref 5–15)
BUN: 10 mg/dL (ref 8–23)
CALCIUM: 8.5 mg/dL — AB (ref 8.9–10.3)
CO2: 22 mmol/L (ref 22–32)
CREATININE: 1.24 mg/dL — AB (ref 0.44–1.00)
Chloride: 108 mmol/L (ref 98–111)
GFR calc non Af Amer: 44 mL/min — ABNORMAL LOW (ref >60)
GFR, EST AFRICAN AMERICAN: 51 mL/min — AB (ref >60)
GLUCOSE: 98 mg/dL (ref 70–99)
Potassium: 3.7 mmol/L (ref 3.5–5.1)
Sodium: 139 mmol/L (ref 135–145)

## 2017-12-12 LAB — CBC
HEMATOCRIT: 30.6 % — AB (ref 36.0–46.0)
Hemoglobin: 9.3 g/dL — ABNORMAL LOW (ref 12.0–15.0)
MCH: 27 pg (ref 26.0–34.0)
MCHC: 30.4 g/dL (ref 30.0–36.0)
MCV: 88.7 fL (ref 78.0–100.0)
Platelets: 330 10*3/uL (ref 150–400)
RBC: 3.45 MIL/uL — ABNORMAL LOW (ref 3.87–5.11)
RDW: 15.9 % — AB (ref 11.5–15.5)
WBC: 7.1 10*3/uL (ref 4.0–10.5)

## 2017-12-12 SURGERY — REMOVAL, TISSUE EXPANDER, BREAST, WITH IMPLANT INSERTION
Anesthesia: General | Site: Breast | Laterality: Right

## 2017-12-12 MED ORDER — SODIUM CHLORIDE 0.9 % IV SOLN
INTRAVENOUS | Status: AC
  Filled 2017-12-12: qty 500000

## 2017-12-12 MED ORDER — FENTANYL CITRATE (PF) 100 MCG/2ML IJ SOLN
INTRAMUSCULAR | Status: DC | PRN
  Administered 2017-12-12 (×2): 50 ug via INTRAVENOUS

## 2017-12-12 MED ORDER — MIDAZOLAM HCL 5 MG/5ML IJ SOLN
INTRAMUSCULAR | Status: DC | PRN
  Administered 2017-12-12: 1 mg via INTRAVENOUS

## 2017-12-12 MED ORDER — FENTANYL CITRATE (PF) 250 MCG/5ML IJ SOLN
INTRAMUSCULAR | Status: AC
  Filled 2017-12-12: qty 5

## 2017-12-12 MED ORDER — SODIUM CHLORIDE 0.9 % IV SOLN
INTRAVENOUS | Status: DC | PRN
  Administered 2017-12-12: 500 mL

## 2017-12-12 MED ORDER — 0.9 % SODIUM CHLORIDE (POUR BTL) OPTIME
TOPICAL | Status: DC | PRN
  Administered 2017-12-12: 3000 mL

## 2017-12-12 MED ORDER — DEXAMETHASONE SODIUM PHOSPHATE 10 MG/ML IJ SOLN
INTRAMUSCULAR | Status: DC | PRN
  Administered 2017-12-12: 10 mg via INTRAVENOUS

## 2017-12-12 MED ORDER — LIDOCAINE 2% (20 MG/ML) 5 ML SYRINGE
INTRAMUSCULAR | Status: AC
  Filled 2017-12-12: qty 5

## 2017-12-12 MED ORDER — MIDAZOLAM HCL 2 MG/2ML IJ SOLN
INTRAMUSCULAR | Status: AC
  Filled 2017-12-12: qty 2

## 2017-12-12 MED ORDER — ONDANSETRON HCL 4 MG/2ML IJ SOLN
INTRAMUSCULAR | Status: AC
  Filled 2017-12-12: qty 2

## 2017-12-12 MED ORDER — PROPOFOL 10 MG/ML IV BOLUS
INTRAVENOUS | Status: AC
  Filled 2017-12-12: qty 40

## 2017-12-12 MED ORDER — PROPOFOL 10 MG/ML IV BOLUS
INTRAVENOUS | Status: DC | PRN
  Administered 2017-12-12: 160 mg via INTRAVENOUS
  Administered 2017-12-12: 40 mg via INTRAVENOUS

## 2017-12-12 MED ORDER — LACTATED RINGERS IV SOLN
INTRAVENOUS | Status: DC
  Administered 2017-12-12: 07:00:00 via INTRAVENOUS

## 2017-12-12 MED ORDER — LIDOCAINE 2% (20 MG/ML) 5 ML SYRINGE
INTRAMUSCULAR | Status: DC | PRN
  Administered 2017-12-12: 100 mg via INTRAVENOUS

## 2017-12-12 MED ORDER — DEXAMETHASONE SODIUM PHOSPHATE 10 MG/ML IJ SOLN
INTRAMUSCULAR | Status: AC
  Filled 2017-12-12: qty 1

## 2017-12-12 MED ORDER — VANCOMYCIN HCL IN DEXTROSE 1-5 GM/200ML-% IV SOLN
1000.0000 mg | INTRAVENOUS | Status: AC
  Administered 2017-12-12: 1000 mg via INTRAVENOUS
  Filled 2017-12-12: qty 200

## 2017-12-12 MED ORDER — ONDANSETRON HCL 4 MG/2ML IJ SOLN
INTRAMUSCULAR | Status: DC | PRN
  Administered 2017-12-12: 4 mg via INTRAVENOUS

## 2017-12-12 SURGICAL SUPPLY — 46 items
ADH SKN CLS APL DERMABOND .7 (GAUZE/BANDAGES/DRESSINGS) ×1
BAG DECANTER FOR FLEXI CONT (MISCELLANEOUS) ×2 IMPLANT
BINDER BREAST LRG (GAUZE/BANDAGES/DRESSINGS) IMPLANT
BINDER BREAST XLRG (GAUZE/BANDAGES/DRESSINGS) ×1 IMPLANT
BLADE 10 SAFETY STRL DISP (BLADE) ×2 IMPLANT
CANISTER SUCT 3000ML PPV (MISCELLANEOUS) ×2 IMPLANT
CHLORAPREP W/TINT 26ML (MISCELLANEOUS) ×1 IMPLANT
COVER SURGICAL LIGHT HANDLE (MISCELLANEOUS) ×2 IMPLANT
DERMABOND ADVANCED (GAUZE/BANDAGES/DRESSINGS) ×1
DERMABOND ADVANCED .7 DNX12 (GAUZE/BANDAGES/DRESSINGS) ×1 IMPLANT
DRAIN CHANNEL 15F RND FF W/TCR (WOUND CARE) ×2 IMPLANT
DRAPE ORTHO SPLIT 77X108 STRL (DRAPES)
DRAPE SURG ORHT 6 SPLT 77X108 (DRAPES) IMPLANT
DRAPE WARM FLUID 44X44 (DRAPE) ×2 IMPLANT
DRSG PAD ABDOMINAL 8X10 ST (GAUZE/BANDAGES/DRESSINGS) ×3 IMPLANT
ELECT BLADE 4.0 EZ CLEAN MEGAD (MISCELLANEOUS) ×2
ELECT REM PT RETURN 9FT ADLT (ELECTROSURGICAL) ×2
ELECTRODE BLDE 4.0 EZ CLN MEGD (MISCELLANEOUS) ×1 IMPLANT
ELECTRODE REM PT RTRN 9FT ADLT (ELECTROSURGICAL) ×1 IMPLANT
EVACUATOR SILICONE 100CC (DRAIN) ×2 IMPLANT
GAUZE SPONGE 4X4 12PLY STRL (GAUZE/BANDAGES/DRESSINGS) ×2 IMPLANT
GLOVE BIO SURGEON STRL SZ 6 (GLOVE) ×2 IMPLANT
GLOVE SURG SS PI 6.0 STRL IVOR (GLOVE) ×2 IMPLANT
GOWN STRL REUS W/ TWL LRG LVL3 (GOWN DISPOSABLE) ×2 IMPLANT
GOWN STRL REUS W/TWL LRG LVL3 (GOWN DISPOSABLE) ×4
KIT BASIN OR (CUSTOM PROCEDURE TRAY) ×2 IMPLANT
KIT TURNOVER KIT B (KITS) ×2 IMPLANT
NS IRRIG 1000ML POUR BTL (IV SOLUTION) ×4 IMPLANT
PACK GENERAL/GYN (CUSTOM PROCEDURE TRAY) ×2 IMPLANT
PAD ABD 8X10 STRL (GAUZE/BANDAGES/DRESSINGS) ×1 IMPLANT
PAD ARMBOARD 7.5X6 YLW CONV (MISCELLANEOUS) ×2 IMPLANT
PIN SAFETY STERILE (MISCELLANEOUS) ×2 IMPLANT
SOL PREP POV-IOD 4OZ 10% (MISCELLANEOUS) ×2 IMPLANT
STAPLER VISISTAT 35W (STAPLE) ×2 IMPLANT
SUT CHROMIC 4 0 PS 2 18 (SUTURE) IMPLANT
SUT ETHILON 2 0 FS 18 (SUTURE) ×2 IMPLANT
SUT ETHILON 4 0 PS 2 18 (SUTURE) ×4 IMPLANT
SUT MNCRL AB 4-0 PS2 18 (SUTURE) ×2 IMPLANT
SUT MON AB 3-0 SH 27 (SUTURE) ×2
SUT MON AB 3-0 SH27 (SUTURE) ×1 IMPLANT
SUT VIC AB 3-0 PS2 18 (SUTURE) ×2
SUT VIC AB 3-0 PS2 18XBRD (SUTURE) ×1 IMPLANT
SUT VIC AB 4-0 PS2 27 (SUTURE) ×2 IMPLANT
TOWEL OR 17X24 6PK STRL BLUE (TOWEL DISPOSABLE) ×4 IMPLANT
TOWEL OR 17X26 10 PK STRL BLUE (TOWEL DISPOSABLE) ×2 IMPLANT
TRAY FOLEY MTR SLVR 14FR STAT (SET/KITS/TRAYS/PACK) IMPLANT

## 2017-12-12 NOTE — Interval H&P Note (Signed)
History and Physical Interval Note:  12/12/2017 7:01 AM  [name]  has presented today for surgery, with the diagnosis of HISTORY OF BREAST CANCER  The various methods of treatment have been discussed with the patient and family. After consideration of risks, benefits and other options for treatment, the patient has consented to  Procedure(s): REMOVAL OF TISSUE EXPANDER, RIGHT (Right) as a surgical intervention .  The patient's history has been reviewed, patient examined, no change in status, stable for surgery.  I have reviewed the patient's chart and labs.  Questions were answered to the patient's satisfaction.     Shaunessy Dobratz

## 2017-12-12 NOTE — Op Note (Signed)
Operative Note   DATE OF OPERATION: 8.17.19  LOCATION: [facility] Main OR-outpatient  SURGICAL DIVISION: Plastic Surgery  PREOPERATIVE DIAGNOSES:  1. History breast cancer 2. Acquired absence breasts 3. History therapeutic radiation 4. Abscess right breast reconstruction  POSTOPERATIVE DIAGNOSES:  same  PROCEDURE:  Removal right chest tissue expander  SURGEON: Irene Limbo MD MBA  ASSISTANT: none  ANESTHESIA:  General.   EBL: 263 ml  COMPLICATIONS: None immediate.   INDICATIONS FOR PROCEDURE:  The patient, [name], is a 67 y.o. female born on 05-23-1950, is here for removal right chest tissue expander. Patient has undergone bilateral mastectomies with expander based reconstruction. She has a history of right chest radiation and difficulty with expansion and is 3 months post operative latissimus flap right chest. She has had problems with wounds and drainage following this surgery. Prior cultures in office MSSA. She presents with 1-2 day history increasing swelling and erythema chest.   FINDINGS: Patient spontaneously draining turbid fluid from medial inset skin paddle latissimus flap.The latissimus muscle was noted to be absent or retracted over medial lower pole reconstruction.  DESCRIPTION OF PROCEDURE:  The patient's operative site was marked with the patient in the preoperative area. The patient was taken to the operating room. SCDs were placed and IV antibiotics were given. The patient's operative site was prepped and draped in a sterile fashion. A time out was performed and all information was confirmed to be correct. Sharp excision of skin at medial inset skin paddle where patient was draining completed. Following this tissue expander exposed. This was drained and removed. Additional area at 6 o clock inset skin paddle with attenuation skin excised sharply as noted in findings latissimus muscle absent or retracted from lower pole reconstruction. Curettage performed to cavity  and irrigated with polymyxin-bacitracin saline solution. 19 Fr drain placed and secured with 2-0 nylon. Wounds closed with 3-0 monocryl in dermis, 4-0 nylon short running for skin closure. Dermabond applied. Dry dressing and breast binder applied.   The patient was allowed to wake from anesthesia, extubated and taken to the recovery room in satisfactory condition.   SPECIMENS: cultures  DRAINS: 19 Fr JP in right chest  Irene Limbo, MD [facility] Plastic & Reconstructive Surgery [phone], pin [phone]

## 2017-12-12 NOTE — Anesthesia Procedure Notes (Signed)
Procedure Name: LMA Insertion Date/Time: 12/12/2017 8:39 AM Performed by: Scheryl Darter, CRNA Pre-anesthesia Checklist: Patient identified, Emergency Drugs available, Suction available and Patient being monitored Patient Re-evaluated:Patient Re-evaluated prior to induction Oxygen Delivery Method: Circle System Utilized Preoxygenation: Pre-oxygenation with 100% oxygen Induction Type: IV induction Ventilation: Mask ventilation without difficulty LMA: LMA inserted LMA Size: 4.0 Number of attempts: 1 Airway Equipment and Method: Bite block Placement Confirmation: positive ETCO2 Tube secured with: Tape Dental Injury: Teeth and Oropharynx as per pre-operative assessment

## 2017-12-12 NOTE — Anesthesia Postprocedure Evaluation (Signed)
Anesthesia Post Note  Patient: [name]  Procedure(s) Performed: REMOVAL OF TISSUE EXPANDER, RIGHT (Right Breast)     Patient location during evaluation: PACU Anesthesia Type: General Level of consciousness: awake and alert Pain management: pain level controlled Vital Signs Assessment: post-procedure vital signs reviewed and stable Respiratory status: spontaneous breathing, nonlabored ventilation and respiratory function stable Cardiovascular status: blood pressure returned to baseline and stable Postop Assessment: no apparent nausea or vomiting Anesthetic complications: no    Last Vitals:  Vitals:   12/12/17 0952 12/12/17 1005  BP:  116/80  Pulse: 97 91  Resp: 20 (!) 24  Temp:  36.5 C  SpO2: 100% 96%    Last Pain:  Vitals:   12/12/17 0933  TempSrc:   PainSc: 0-No pain                 Audry Pili

## 2017-12-12 NOTE — Anesthesia Preprocedure Evaluation (Addendum)
Anesthesia Evaluation  Patient identified by MRN, date of birth, ID band Patient awake    Reviewed: Allergy & Precautions, NPO status , Patient's Chart, lab work & pertinent test results  History of Anesthesia Complications Negative for: history of anesthetic complications  Airway Mallampati: II  TM Distance: >3 FB Neck ROM: Full    Dental  (+) Dental Advisory Given, Teeth Intact   Pulmonary sleep apnea ,    breath sounds clear to auscultation       Cardiovascular hypertension (no longer on medications), (-) angina Rhythm:Regular Rate:Normal     Neuro/Psych negative neurological ROS  negative psych ROS   GI/Hepatic negative GI ROS, Neg liver ROS,   Endo/Other  Hypothyroidism  Obesity   Renal/GU negative Renal ROS  negative genitourinary   Musculoskeletal  (+) Arthritis ,  Chronic low back pain    Abdominal (+) + obese,   Peds  Hematology negative hematology ROS (+)   Anesthesia Other Findings   Reproductive/Obstetrics  Breast cancer                             Anesthesia Physical Anesthesia Plan  ASA: II  Anesthesia Plan: General   Post-op Pain Management:    Induction:   PONV Risk Score and Plan: 3 and Treatment may vary due to age or medical condition, Ondansetron, Dexamethasone and Midazolam  Airway Management Planned: LMA  Additional Equipment: None  Intra-op Plan:   Post-operative Plan: Extubation in OR  Informed Consent: I have reviewed the patients History and Physical, chart, labs and discussed the procedure including the risks, benefits and alternatives for the proposed anesthesia with the patient or authorized representative who has indicated his/her understanding and acceptance.   Dental advisory given  Plan Discussed with: CRNA and Anesthesiologist  Anesthesia Plan Comments:        Anesthesia Quick Evaluation

## 2017-12-12 NOTE — Transfer of Care (Signed)
Immediate Anesthesia Transfer of Care Note  Patient: [name]  Procedure(s) Performed: REMOVAL OF TISSUE EXPANDER, RIGHT (Right Breast)  Patient Location: PACU  Anesthesia Type:General  Level of Consciousness: awake, alert , oriented and sedated  Airway & Oxygen Therapy: Patient Spontanous Breathing and Patient connected to nasal cannula oxygen  Post-op Assessment: Report given to RN, Post -op Vital signs reviewed and stable and Patient moving all extremities  Post vital signs: Reviewed and stable  Last Vitals:  Vitals Value Taken Time  BP 112/81 12/12/2017  9:34 AM  Temp 36.1 C 12/12/2017  9:33 AM  Pulse 99 12/12/2017  9:35 AM  Resp 15 12/12/2017  9:35 AM  SpO2 96 % 12/12/2017  9:35 AM  Vitals shown include unvalidated device data.  Last Pain:  Vitals:   12/12/17 0709  TempSrc: Oral  PainSc: 6       Patients Stated Pain Goal: 6 (50/51/83 3582)  Complications: No apparent anesthesia complications

## 2017-12-13 ENCOUNTER — Encounter (HOSPITAL_COMMUNITY): Payer: Self-pay | Admitting: Plastic Surgery

## 2017-12-14 DIAGNOSIS — Z6835 Body mass index (BMI) 35.0-35.9, adult: Secondary | ICD-10-CM | POA: Diagnosis not present

## 2017-12-14 DIAGNOSIS — I10 Essential (primary) hypertension: Secondary | ICD-10-CM | POA: Diagnosis not present

## 2017-12-16 ENCOUNTER — Other Ambulatory Visit (HOSPITAL_COMMUNITY): Payer: Self-pay | Admitting: Endocrinology

## 2017-12-16 DIAGNOSIS — Z9013 Acquired absence of bilateral breasts and nipples: Secondary | ICD-10-CM | POA: Diagnosis not present

## 2017-12-16 DIAGNOSIS — T8131XA Disruption of external operation (surgical) wound, not elsewhere classified, initial encounter: Secondary | ICD-10-CM | POA: Diagnosis not present

## 2017-12-16 DIAGNOSIS — Z853 Personal history of malignant neoplasm of breast: Secondary | ICD-10-CM | POA: Diagnosis not present

## 2017-12-16 DIAGNOSIS — S21001A Unspecified open wound of right breast, initial encounter: Secondary | ICD-10-CM | POA: Diagnosis not present

## 2017-12-16 DIAGNOSIS — C73 Malignant neoplasm of thyroid gland: Secondary | ICD-10-CM

## 2017-12-16 DIAGNOSIS — Z923 Personal history of irradiation: Secondary | ICD-10-CM | POA: Diagnosis not present

## 2017-12-17 LAB — AEROBIC/ANAEROBIC CULTURE W GRAM STAIN (SURGICAL/DEEP WOUND)

## 2017-12-17 LAB — AEROBIC/ANAEROBIC CULTURE (SURGICAL/DEEP WOUND)

## 2017-12-25 ENCOUNTER — Ambulatory Visit (HOSPITAL_COMMUNITY): Payer: Medicare Other

## 2017-12-30 ENCOUNTER — Ambulatory Visit (HOSPITAL_COMMUNITY)
Admission: RE | Admit: 2017-12-30 | Discharge: 2017-12-30 | Disposition: A | Discharge status: Home / Self Care | Payer: Medicare Other | Source: Ambulatory Visit | Attending: Endocrinology | Admitting: Endocrinology

## 2017-12-30 DIAGNOSIS — I700 Atherosclerosis of aorta: Secondary | ICD-10-CM | POA: Insufficient documentation

## 2017-12-30 DIAGNOSIS — C73 Malignant neoplasm of thyroid gland: Secondary | ICD-10-CM | POA: Diagnosis not present

## 2017-12-30 DIAGNOSIS — Z9889 Other specified postprocedural states: Secondary | ICD-10-CM | POA: Diagnosis not present

## 2017-12-30 DIAGNOSIS — Z6834 Body mass index (BMI) 34.0-34.9, adult: Secondary | ICD-10-CM | POA: Diagnosis not present

## 2017-12-30 DIAGNOSIS — I10 Essential (primary) hypertension: Secondary | ICD-10-CM | POA: Diagnosis not present

## 2017-12-30 LAB — GLUCOSE, CAPILLARY: GLUCOSE-CAPILLARY: 93 mg/dL (ref 70–99)

## 2017-12-30 MED ORDER — FLUDEOXYGLUCOSE F - 18 (FDG) INJECTION
9.8300 | Freq: Once | INTRAVENOUS | Status: AC | PRN
  Administered 2017-12-30: 9.83 via INTRAVENOUS

## 2018-01-01 DIAGNOSIS — L03319 Cellulitis of trunk, unspecified: Secondary | ICD-10-CM | POA: Diagnosis not present

## 2018-01-01 DIAGNOSIS — L02219 Cutaneous abscess of trunk, unspecified: Secondary | ICD-10-CM | POA: Diagnosis not present

## 2018-01-01 DIAGNOSIS — Z48817 Encounter for surgical aftercare following surgery on the skin and subcutaneous tissue: Secondary | ICD-10-CM | POA: Diagnosis not present

## 2018-01-01 DIAGNOSIS — Z4802 Encounter for removal of sutures: Secondary | ICD-10-CM | POA: Diagnosis not present

## 2018-01-01 DIAGNOSIS — L7634 Postprocedural seroma of skin and subcutaneous tissue following other procedure: Secondary | ICD-10-CM | POA: Diagnosis not present

## 2018-01-01 DIAGNOSIS — N6489 Other specified disorders of breast: Secondary | ICD-10-CM | POA: Diagnosis not present

## 2018-01-01 DIAGNOSIS — B9561 Methicillin susceptible Staphylococcus aureus infection as the cause of diseases classified elsewhere: Secondary | ICD-10-CM | POA: Diagnosis not present

## 2018-01-04 ENCOUNTER — Other Ambulatory Visit (HOSPITAL_COMMUNITY): Payer: Self-pay | Admitting: Endocrinology

## 2018-01-04 DIAGNOSIS — C73 Malignant neoplasm of thyroid gland: Secondary | ICD-10-CM

## 2018-01-18 ENCOUNTER — Encounter (HOSPITAL_COMMUNITY)
Admission: RE | Admit: 2018-01-18 | Discharge: 2018-01-18 | Disposition: A | Discharge status: Home / Self Care | Payer: Medicare Other | Source: Ambulatory Visit | Attending: Endocrinology | Admitting: Endocrinology

## 2018-01-18 DIAGNOSIS — C73 Malignant neoplasm of thyroid gland: Secondary | ICD-10-CM | POA: Insufficient documentation

## 2018-01-18 MED ORDER — STERILE WATER FOR INJECTION IJ SOLN
INTRAMUSCULAR | Status: AC
  Filled 2018-01-18: qty 10

## 2018-01-18 MED ORDER — THYROTROPIN ALFA 1.1 MG IM SOLR
0.9000 mg | INTRAMUSCULAR | Status: AC
  Administered 2018-01-18: 0.9 mg via INTRAMUSCULAR

## 2018-01-19 ENCOUNTER — Encounter (HOSPITAL_COMMUNITY)
Admission: RE | Admit: 2018-01-19 | Discharge: 2018-01-19 | Disposition: A | Discharge status: Home / Self Care | Payer: Medicare Other | Source: Ambulatory Visit | Attending: Endocrinology | Admitting: Endocrinology

## 2018-01-19 DIAGNOSIS — C73 Malignant neoplasm of thyroid gland: Secondary | ICD-10-CM | POA: Diagnosis not present

## 2018-01-19 MED ORDER — STERILE WATER FOR INJECTION IJ SOLN
INTRAMUSCULAR | Status: AC
  Filled 2018-01-19: qty 10

## 2018-01-19 MED ORDER — THYROTROPIN ALFA 1.1 MG IM SOLR
0.9000 mg | INTRAMUSCULAR | Status: AC
  Administered 2018-01-19: 0.9 mg via INTRAMUSCULAR

## 2018-01-20 ENCOUNTER — Encounter (HOSPITAL_COMMUNITY)
Admission: RE | Admit: 2018-01-20 | Discharge: 2018-01-20 | Disposition: A | Discharge status: Home / Self Care | Payer: Medicare Other | Source: Ambulatory Visit | Attending: Endocrinology | Admitting: Endocrinology

## 2018-01-20 DIAGNOSIS — C73 Malignant neoplasm of thyroid gland: Secondary | ICD-10-CM | POA: Diagnosis not present

## 2018-01-20 MED ORDER — SODIUM IODIDE I 131 CAPSULE
149.0000 | Freq: Once | INTRAVENOUS | Status: AC | PRN
  Administered 2018-01-20: 149 via ORAL

## 2018-01-21 DIAGNOSIS — D0512 Intraductal carcinoma in situ of left breast: Secondary | ICD-10-CM | POA: Diagnosis not present

## 2018-01-27 ENCOUNTER — Encounter: Payer: Self-pay | Admitting: Plastic Surgery

## 2018-01-27 ENCOUNTER — Ambulatory Visit (HOSPITAL_COMMUNITY)
Admission: RE | Admit: 2018-01-27 | Discharge: 2018-01-27 | Disposition: A | Discharge status: Home / Self Care | Payer: Medicare Other | Source: Ambulatory Visit | Attending: Endocrinology | Admitting: Endocrinology

## 2018-01-27 ENCOUNTER — Ambulatory Visit (INDEPENDENT_AMBULATORY_CARE_PROVIDER_SITE_OTHER): Payer: Medicare Other | Admitting: Plastic Surgery

## 2018-01-27 VITALS — BP 122/80 | HR 100 | Resp 14 | Ht 64.0 in | Wt 197.0 lb

## 2018-01-27 DIAGNOSIS — Z853 Personal history of malignant neoplasm of breast: Secondary | ICD-10-CM

## 2018-01-27 DIAGNOSIS — C73 Malignant neoplasm of thyroid gland: Secondary | ICD-10-CM | POA: Insufficient documentation

## 2018-01-27 DIAGNOSIS — Z9882 Breast implant status: Secondary | ICD-10-CM

## 2018-01-27 DIAGNOSIS — M25521 Pain in right elbow: Secondary | ICD-10-CM | POA: Diagnosis not present

## 2018-01-27 DIAGNOSIS — Z9889 Other specified postprocedural states: Secondary | ICD-10-CM

## 2018-01-27 NOTE — Progress Notes (Signed)
   Subjective:    Patient ID: [name], female    DOB: 03-06-1951, 67 y.o.   MRN: [id number]  The patient is a 67 yrs old bf here for follow up on her bilateral breast reconstruction.  She had bilateral mastectomies.  The left expander is in place.  The right side underwent a latissimus with expander placement.  She had a problem with the area and underwent a right expander removal in August.  The skin and tissue looks very good at this time.  There is no sign of infection.  The left expander has ~ 740 / 475 cc.  The right had ~ 490 / 475 cc prior to removal.    History: She was diagnosed with new LEFT breast cancer, ductal carcinoma in-situ, ER / PR positive. She had right breast cancer in the past and underwent a lumpectomy with radiation 15 years ago. She received chemotherapy treatment and tamoxifen. This Left breast cancer was detected by routine mammogram which showed calcifications in the upper outer quadrant ~ 5 cm in size. She is 5 feet 4 inches tall, weight is 218 pounds. Preop bra is 34-36 B but she measures 40-41 inches chest circumference.  She has significant breast asymmetry with the left larger than the right due to the right lumpectomy and radiation.    Review of Systems  Constitutional: Negative.  Negative for activity change and appetite change.  HENT: Negative.  Negative for facial swelling.   Eyes: Negative.   Respiratory: Negative.   Cardiovascular: Negative.   Endocrine: Negative.   Genitourinary: Negative.   Musculoskeletal: Negative.   Skin: Negative.  Negative for color change.  Hematological: Negative.   Psychiatric/Behavioral: Negative.     Current Outpatient Medications:  .  cholecalciferol (VITAMIN D) 1000 units tablet, Take 1,000 Units by mouth daily., Disp: , Rfl:  .  levothyroxine (SYNTHROID, LEVOTHROID) 88 MCG tablet, Take 88 mcg by mouth daily before breakfast., Disp: , Rfl:  .  sulfamethoxazole-trimethoprim (BACTRIM DS,SEPTRA DS) 800-160 MG  tablet, Take 1 tablet by mouth 2 (two) times daily., Disp: , Rfl: 0     Objective:   Physical Exam  Constitutional: She is oriented to person, place, and time. She appears well-developed and well-nourished.  HENT:  Head: Normocephalic and atraumatic.  Eyes: Pupils are equal, round, and reactive to light. EOM are normal.  Cardiovascular: Normal rate.  Pulmonary/Chest: Effort normal. No respiratory distress.  Neurological: She is alert and oriented to person, place, and time.  Skin: Skin is warm.   Vitals:   01/27/18 1349  BP: 122/80  Pulse: 100  Resp: 14  SpO2: 98%  Weight: 197 lb (89.4 kg)  Height: 5\' 4"  (1.626 m)      Assessment & Plan:  History of breast cancer in female  History of reconstruction of both breasts We discussed options for removal of expander and no further reconstruction. Another option was for exchange of the left expander to a smaller implant.  Then the right side can be done or not at a later time. Plan for removal of left breast expander with placement of a left breast implant.

## 2018-01-29 DIAGNOSIS — Z23 Encounter for immunization: Secondary | ICD-10-CM | POA: Diagnosis not present

## 2018-01-29 DIAGNOSIS — Z6833 Body mass index (BMI) 33.0-33.9, adult: Secondary | ICD-10-CM | POA: Diagnosis not present

## 2018-01-29 DIAGNOSIS — I10 Essential (primary) hypertension: Secondary | ICD-10-CM | POA: Diagnosis not present

## 2018-02-03 ENCOUNTER — Ambulatory Visit: Payer: Medicare Other | Admitting: Physical Therapy

## 2018-02-03 DIAGNOSIS — M25532 Pain in left wrist: Secondary | ICD-10-CM | POA: Diagnosis not present

## 2018-02-05 ENCOUNTER — Ambulatory Visit: Payer: Medicare Other | Attending: Orthopedic Surgery | Admitting: Physical Therapy

## 2018-02-05 ENCOUNTER — Other Ambulatory Visit: Payer: Self-pay

## 2018-02-05 ENCOUNTER — Encounter: Payer: Self-pay | Admitting: Physical Therapy

## 2018-02-05 DIAGNOSIS — M25521 Pain in right elbow: Secondary | ICD-10-CM | POA: Insufficient documentation

## 2018-02-05 DIAGNOSIS — M79645 Pain in left finger(s): Secondary | ICD-10-CM

## 2018-02-05 DIAGNOSIS — M6281 Muscle weakness (generalized): Secondary | ICD-10-CM | POA: Diagnosis not present

## 2018-02-05 NOTE — Patient Instructions (Signed)
Wrist Flexor Stretch   With palms resting comfortably on table, slowly move body over hands until gentle stretch is felt in forearms. Hold _30___ seconds. Relax. Repeat __3__ times per set. Do ____ sets per session. Do __2-3__ sessions per day.  Copyright  VHI. All rights reserved.  Wrist Extensor Stretch   Keeping elbow straight, grasp left hand and slowly bend wrist forward until stretch is felt. Hold _30___ seconds. Relax. Repeat __3__ times per set. Do ____ sets per session. Do _2-3_ sessions per day.  Copyright  VHI. All rights reserved.   Trigger Point Dry Needling  . What is Trigger Point Dry Needling (DN)? o DN is a physical therapy technique used to treat muscle pain and dysfunction. Specifically, DN helps deactivate muscle trigger points (muscle knots).  o A thin filiform needle is used to penetrate the skin and stimulate the underlying trigger point. The goal is for a local twitch response (LTR) to occur and for the trigger point to relax. No medication of any kind is injected during the procedure.   . What Does Trigger Point Dry Needling Feel Like?  o The procedure feels different for each individual patient. Some patients report that they do not actually feel the needle enter the skin and overall the process is not painful. Very mild bleeding may occur. However, many patients feel a deep cramping in the muscle in which the needle was inserted. This is the local twitch response.   Marland Kitchen How Will I feel after the treatment? o Soreness is normal, and the onset of soreness may not occur for a few hours. Typically this soreness does not last longer than two days.  o Bruising is uncommon, however; ice can be used to decrease any possible bruising.  o In rare cases feeling tired or nauseous after the treatment is normal. In addition, your symptoms may get worse before they get better, this period will typically not last longer than 24 hours.   . What Can I do After My  Treatment? o Increase your hydration by drinking more water for the next 24 hours. o You may place ice or heat on the areas treated that have become sore, however, do not use heat on inflamed or bruised areas. Heat often brings more relief post needling. o You can continue your regular activities, but vigorous activity is not recommended initially after the treatment for 24 hours. o DN is best combined with other physical therapy such as strengthening, stretching, and other therapies.    Precautions:  In some cases, dry needling is done over the lung field. While rare, there is a risk of pneumothorax (punctured lung). Because of this, if you ever experience shortness of breath on exertion, difficulty taking a deep breath, chest pain or a dry cough following dry needling, you should report to an emergency room and tell them that you have been dry needled over the thorax.  IONTOPHORESIS PATIENT PRECAUTIONS & CONTRAINDICATIONS:  . Redness under one or both electrodes can occur.  This characterized by a uniform redness that usually disappears within 12 hours of treatment. . Small pinhead size blisters may result in response to the drug.  Contact your physician if the problem persists more than 24 hours. . On rare occasions, iontophoresis therapy can result in temporary skin reactions such as rash, inflammation, irritation or burns.  The skin reactions may be the result of individual sensitivity to the ionic solution used, the condition of the skin at the start of treatment, reaction to  the materials in the electrodes, allergies or sensitivity to dexamethasone, or a poor connection between the patch and your skin.  Discontinue using iontophoresis if you have any of these reactions and report to your therapist. . Remove the Patch or electrodes if you have any undue sensation of pain or burning during the treatment and report discomfort to your therapist. . Tell your Therapist if you have had known adverse  reactions to the application of electrical current. . If using the Patch, the LED light will turn off when treatment is complete and the patch can be removed.  Approximate treatment time is 1-3 hours.  Remove the patch when light goes off or after 6 hours. . The Patch can be worn during normal activity, however excessive motion where the electrodes have been placed can cause poor contact between the skin and the electrode or uneven electrical current resulting in greater risk of skin irritation. Marland Kitchen Keep out of the reach of children.   . DO NOT use if you have a cardiac pacemaker or any other electrically sensitive implanted device. . DO NOT use if you have a known sensitivity to dexamethasone. . DO NOT use during Magnetic Resonance Imaging (MRI). . DO NOT use over broken or compromised skin (e.g. sunburn, cuts, or acne) due to the increased risk of skin reaction. . DO NOT SHAVE over the area to be treated:  To establish good contact between the Patch and the skin, excessive hair may be clipped. . DO NOT place the Patch or electrodes on or over your eyes, directly over your heart, or brain. . DO NOT reuse the Patch or electrodes as this may cause burns to occur.    [name], PT 02/05/18 8:42 AM [facility] [address] [place], [place], [address] Phone: [phone]   Fax:  [phone]

## 2018-02-05 NOTE — Therapy (Signed)
[facility] [address] [place], [place], [address] Phone: [phone]   Fax:  [phone]  Physical Therapy Evaluation  Patient Details  Name: [name] MRN: [id number] Date of Birth: 1950-05-02 Referring Provider (PT): Edmonia Lynch   Encounter Date: 02/05/2018  PT End of Session - 02/05/18 0754    Visit Number  1    Date for PT Re-Evaluation  04/02/18    Authorization Type  MCR    PT Start Time  0800    PT Stop Time  0851    PT Time Calculation (min)  51 min    Activity Tolerance  Patient tolerated treatment well    Behavior During Therapy  [facility] for tasks assessed/performed       Past Medical History:  Diagnosis Date  . Anemia    "years ago"  . Arthritis    "left hand; was in my knees" (09/24/2017)  . Breast cancer, right breast ([place]) 2004  . Bruises easily   . Chronic lower back pain   . Ductal carcinoma in situ (DCIS) of left breast 2018  . Genetic testing 04/17/2017   STAT Breast panel with reflex to Multi-Cancer panel (83 genes) @ [facility] - No pathogenic mutations detected  . History of bronchitis    when she was young  . Hypertension    no longer taking medications as of August 2019  . Hypothyroidism    takes Synthroid daily  . Joint pain   . Joint swelling   . Personal history of chemotherapy 04/2003  . Personal history of radiation therapy 2005  . Pneumonia    hx of when she was young  . Right elbow tendonitis   . Sleep apnea    has a machine but does not use (09/24/2017)  . Thyroid cancer ([place]) 2012  . Urinary frequency     Past Surgical History:  Procedure Laterality Date  . ANTERIOR CERVICAL DECOMP/DISCECTOMY FUSION  2008  . APPENDECTOMY  2007  . BACK SURGERY    . BREAST BIOPSY Right 02/28/2003   malignant  . BREAST BIOPSY Left 03/2017  . BREAST LUMPECTOMY Right 2004  . BREAST RECONSTRUCTION WITH PLACEMENT OF TISSUE EXPANDER AND FLEX HD (ACELLULAR HYDRATED DERMIS)  Bilateral 06/22/2017   Procedure: IMMEDIATE BILATERAL BREAST RECONSTRUCTION WITH PLACEMENT OF TISSUE EXPANDER AND FLEX HD (ACELLULAR HYDRATED DERMIS);  Surgeon: Wallace Going, DO;  Location: [facility];  Service: Plastics;  Laterality: Bilateral;  . CESAREAN SECTION  1981  . COLONOSCOPY  X 2  . JOINT REPLACEMENT    . KNEE ARTHROSCOPY Right 2007  . KNEE ARTHROSCOPY  05/02/2011   Procedure: ARTHROSCOPY KNEE;  Surgeon: Johnn Hai;  Location: [facility];  Service: Orthopedics;  Laterality: Right;  . LATISSIMUS FLAP TO BREAST Right 09/23/2017   Procedure: RIGHT BREAST LATISSIMUS DORSI FLAP RECONSTRUCTION WITH TISSUE EXPANDER;  Surgeon: Wallace Going, DO;  Location: [facility];  Service: Plastics;  Laterality: Right;  . MASTECTOMY W/ SENTINEL NODE BIOPSY Bilateral 06/22/2017   Procedure: LEFT MASTECTOMY WITH SENTINEL LYMPH NODE BIOPSY AND RIGHT PROPHYLACTIC MASTECTOMY;  Surgeon: Jovita Kussmaul, MD;  Location: [facility];  Service: General;  Laterality: Bilateral;  . MENISCUS DEBRIDEMENT  05/02/2011   Procedure: DEBRIDEMENT OF MENISCUS;  Surgeon: Johnn Hai;  Location: [facility];  Service: Orthopedics;  Laterality: Right;  . PORTA CATH REMOVAL  2005  . PORTACATH PLACEMENT  2004  . REMOVAL OF TISSUE  EXPANDER AND PLACEMENT OF IMPLANT Right 12/12/2017   Procedure: REMOVAL OF TISSUE EXPANDER, RIGHT;  Surgeon: Irene Limbo, MD;  Location: [facility];  Service: Plastics;  Laterality: Right;  . THYROIDECTOMY N/A 09/22/2012   Procedure: RIGHT THYROID LOBECTOMY WITH FROZEN SECTION;  Surgeon: Izora Gala, MD;  Location: [facility];  Service: ENT;  Laterality: N/A;  . THYROIDECTOMY Left 10/21/2012   Procedure: COMPLETION OF THYROIDECTOMY;  Surgeon: Izora Gala, MD;  Location: [facility];  Service: ENT;  Laterality: Left;  . TOTAL KNEE ARTHROPLASTY  04/14/2012   Procedure: TOTAL KNEE ARTHROPLASTY;  Surgeon: Ninetta Lights, MD;  Location: [facility];   Service: Orthopedics;  Laterality: Right;  RIGHT ARTHROPLASTY KNEE MEDIAL/LATERAL COMPARTMENTS WITH PATELLA RESURFACING  . TOTAL KNEE ARTHROPLASTY Left 03/30/2013   Procedure: TOTAL KNEE ARTHROPLASTY;  Surgeon: Ninetta Lights, MD;  Location: [facility];  Service: Orthopedics;  Laterality: Left;    There were no vitals filed for this visit.   Subjective Assessment - 02/05/18 0757    Subjective  Patient presents with pain in left hand (thenar eminence) and right med/lat epicondylitis. the hand pain has been going on for 2 years. she had an injection 02/03/18 and has had good relief. The elbow started about 8 months ago and she was seen in this clinic previously. She had relief, bu then stopped therapy due to surgery. In the past few months pain has returned as she has returned to work.    Pertinent History  Breast CA 15 yr ago, thryroid CA, DCIS (precancer);  ant cerv fusion 2008    Patient Stated Goals  get rid of pain    Currently in Pain?  Yes    Pain Score  4     Pain Location  Hand    Pain Orientation  Left    Pain Descriptors / Indicators  Aching    Pain Type  Chronic pain    Pain Onset  More than a month ago    Pain Frequency  Constant    Aggravating Factors   movement, keyboard    Pain Relieving Factors  injection, DN    Effect of Pain on Daily Activities  must use splint, limited ADLS    Multiple Pain Sites  Yes    Pain Score  6    Pain Location  Elbow    Pain Orientation  Right;Medial    Pain Descriptors / Indicators  Aching    Pain Type  Chronic pain    Pain Onset  More than a month ago    Pain Frequency  Intermittent    Aggravating Factors   certain movements    Pain Relieving Factors  DN, rest     Effect of Pain on Daily Activities  limits ADLS         [facility] PT Assessment - 02/05/18 0001      Assessment   Medical Diagnosis  left hand pain and rightt med/lat epicondylitis    Referring Provider (PT)  Edmonia Lynch    Onset Date/Surgical Date  05/29/17    Hand Dominance   Right    Prior Therapy  yes      Precautions   Precaution Comments  --      Balance Screen   Has the patient fallen in the past 6 months  No    Has the patient had a decrease in activity level because of a fear of falling?   No    Is the patient reluctant to leave their home  because of a fear of falling?   No      Home Film/video editor residence      Prior Function   Level of Independence  Independent    Vocation  Part time employment    Vocation Requirements  key entry      Observation/Other Assessments   Focus on Therapeutic Outcomes (FOTO)   47% limited      ROM / Strength   AROM / PROM / Strength  AROM;PROM;Strength      AROM   AROM Assessment Site  Wrist;Forearm    Right/Left Forearm  Right    Right/Left Wrist  Right    Right Wrist Extension  61 Degrees   74 passive   Right Wrist Flexion  55 Degrees   73 passive   Right Wrist Radial Deviation  --   full   Right Wrist Ulnar Deviation  --   full     Strength   Overall Strength Comments  R elbow 5/5; R wrist flex 4+/5 with pain at med epicondyle, ext 4/5 mild pull, pain with resisted supination but strong; pain with resisted L thumb flexion but 5/5 in flex/ABD, ADD; R grip 40#, L grip 39#      Palpation   Palpation comment  Left thenar eminance, adductor pollicis, R wrist flexors and extensors and medial epicondyle                Objective measurements completed on examination: See above findings.      [facility] Adult PT Treatment/Exercise - 02/05/18 0001      Modalities   Modalities  Iontophoresis      Iontophoresis   Type of Iontophoresis  Dexamethasone    Location  R medial epicondyle    Dose  25mAmin    Time  4 hour patch             PT Education - 02/05/18 0952    Education Details  HEP; ionto/DN education    Person(s) Educated  Patient    Methods  Explanation;Demonstration;Handout;Verbal cues    Comprehension  Verbalized understanding;Returned  demonstration       PT Short Term Goals - 02/05/18 1003      PT SHORT TERM GOAL #1   Title  Ind with initial HEP    Time  2    Period  Weeks    Status  New    Target Date  02/19/18      PT SHORT TERM GOAL #2   Title  decreased pain in the R elbow by 50% with ADLS    Time  4    Period  Weeks    Status  New    Target Date  03/05/18      PT SHORT TERM GOAL #3   Title  decreased pain in R hand by 50% with ADLS    Time  4    Period  Weeks    Status  New    Target Date  03/05/18        PT Long Term Goals - 02/05/18 1004      PT LONG TERM GOAL #1   Title  Patient able to perform ADLs with R elbow and L hand pain of 2/10 or less    Time  8    Period  Weeks    Status  New    Target Date  04/02/18      PT LONG TERM GOAL #2  Title  Patient to demo 5/5 R wrist strength to ease ADLS    Time  8    Period  Weeks    Status  New      PT LONG TERM GOAL #3   Title  Pt independent and compliant in HEP to prevent further injury to her elbow and hand.    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 She has tightness in her right wrist flexors and extensors and left adductor policis. She has pain with R resisted wrist flexion and L flexor pollicis. She also has mild strength deficits.     History and Personal Factors relevant to plan of care:  Bil breast reconstruction for precancer (05/2017)    Clinical Presentation  Stable    Clinical Decision Making  Low    Rehab Potential  Excellent    PT Frequency  2x / week    PT Duration  8 weeks    PT  Treatment/Interventions  ADLs/Self Care Home Management;Cryotherapy;Electrical Stimulation;Iontophoresis 4mg /ml Dexamethasone;Moist Heat;Ultrasound;Patient/family education;Therapeutic exercise;Manual techniques;Dry needling;Taping    PT Next Visit Plan  assess ionto; DN to R wrist flex/extensors and Left thumb muscles; wrist strengthening/ROM    PT Home Exercise Plan  wrist stretches    Consulted and Agree with Plan of Care  Patient       Patient will benefit from skilled therapeutic intervention in order to improve the following deficits and impairments:  Pain, Decreased range of motion, Impaired flexibility, Decreased strength  Visit Diagnosis: Thumb pain, left - Plan: PT plan of care cert/re-cert  Pain in right elbow - Plan: PT plan of care cert/re-cert     Problem List Patient Active Problem List   Diagnosis Date Noted  . History of breast cancer in female 01/27/2018  . History of reconstruction of both breasts 01/27/2018  . Acquired absence of right breast 09/23/2017  . Breast cancer ([place]) 06/22/2017  . 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 Genetic testing 04/17/2017  . DJD (degenerative joint disease) of knee 03/30/2013  . Unspecified sleep apnea 03/23/2013  . 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 Weakness 11/28/2011  . Hypertension 11/28/2011  . Hypokalemia 11/28/2011  . LUE weakness 11/28/2011  . Hypothyroidism 11/28/2011  . Thyroid cancer ([place]) 04/28/2010    Hannah Crill PT 02/05/2018, 10:13 AM   [facility] [address], [place], [place], [address] Phone: [phone]   Fax:  [phone]  Name: [name] MRN: [id number] Date of Birth: 1950-10-30

## 2018-02-11 ENCOUNTER — Ambulatory Visit: Payer: Medicare Other | Admitting: Physical Therapy

## 2018-02-11 DIAGNOSIS — M25521 Pain in right elbow: Secondary | ICD-10-CM

## 2018-02-11 DIAGNOSIS — M79645 Pain in left finger(s): Secondary | ICD-10-CM

## 2018-02-11 DIAGNOSIS — M6281 Muscle weakness (generalized): Secondary | ICD-10-CM | POA: Diagnosis not present

## 2018-02-11 NOTE — Therapy (Signed)
[facility] [address], [place], [place], [address] Phone: [phone]   Fax:  [phone]  Physical Therapy Treatment  Patient Details  Name: [name] MRN: [id number] Date of Birth: [birth year] Referring Provider (PT): Edmonia Lynch   Encounter Date: 02/11/2018  PT End of Session - 02/11/18 1402    Visit Number  2    Date for PT Re-Evaluation  04/02/18    Authorization Type  MCR    PT Start Time  1401    PT Stop Time  1443    PT Time Calculation (min)  42 min    Activity Tolerance  Patient tolerated treatment well    Behavior During Therapy  [facility] for tasks assessed/performed       Past Medical History:  Diagnosis Date  . Anemia    "years ago"  . Arthritis    "left hand; was in my knees" (09/24/2017)  . Breast cancer, right breast ([place]) 2004  . Bruises easily   . Chronic lower back pain   . Ductal carcinoma in situ (DCIS) of left breast 2018  . Genetic testing 04/17/2017   STAT Breast panel with reflex to Multi-Cancer panel (83 genes) @ [facility] - No pathogenic mutations detected  . History of bronchitis    when she was young  . Hypertension    no longer taking medications as of August 2019  . Hypothyroidism    takes Synthroid daily  . Joint pain   . Joint swelling   . Personal history of chemotherapy 04/2003  . Personal history of radiation therapy 2005  . Pneumonia    hx of when she was young  . Right elbow tendonitis   . Sleep apnea    has a machine but does not use (09/24/2017)  . Thyroid cancer ([place]) 2012  . Urinary frequency     Past Surgical History:  Procedure Laterality Date  . ANTERIOR CERVICAL DECOMP/DISCECTOMY FUSION  2008  . APPENDECTOMY  2007  . BACK SURGERY    . BREAST BIOPSY Right 02/28/2003   malignant  . BREAST BIOPSY Left 03/2017  . BREAST LUMPECTOMY Right 2004  . BREAST RECONSTRUCTION WITH PLACEMENT OF TISSUE EXPANDER AND FLEX HD (ACELLULAR HYDRATED DERMIS)  Bilateral 06/22/2017   Procedure: IMMEDIATE BILATERAL BREAST RECONSTRUCTION WITH PLACEMENT OF TISSUE EXPANDER AND FLEX HD (ACELLULAR HYDRATED DERMIS);  Surgeon: Wallace Going, DO;  Location: [place];  Service: Plastics;  Laterality: Bilateral;  . CESAREAN SECTION  1981  . COLONOSCOPY  X 2  . JOINT REPLACEMENT    . KNEE ARTHROSCOPY Right 2007  . KNEE ARTHROSCOPY  05/02/2011   Procedure: ARTHROSCOPY KNEE;  Surgeon: Johnn Hai;  Location: [place];  Service: Orthopedics;  Laterality: Right;  . LATISSIMUS FLAP TO BREAST Right 09/23/2017   Procedure: RIGHT BREAST LATISSIMUS DORSI FLAP RECONSTRUCTION WITH TISSUE EXPANDER;  Surgeon: Wallace Going, DO;  Location: [place];  Service: Plastics;  Laterality: Right;  . MASTECTOMY W/ SENTINEL NODE BIOPSY Bilateral 06/22/2017   Procedure: LEFT MASTECTOMY WITH SENTINEL LYMPH NODE BIOPSY AND RIGHT PROPHYLACTIC MASTECTOMY;  Surgeon: Jovita Kussmaul, MD;  Location: [place];  Service: General;  Laterality: Bilateral;  . MENISCUS DEBRIDEMENT  05/02/2011   Procedure: DEBRIDEMENT OF MENISCUS;  Surgeon: Johnn Hai;  Location: [place];  Service: Orthopedics;  Laterality: Right;  . PORTA CATH REMOVAL  2005  . PORTACATH PLACEMENT  2004  . REMOVAL OF TISSUE  EXPANDER AND PLACEMENT OF IMPLANT Right 12/12/2017   Procedure: REMOVAL OF TISSUE EXPANDER, RIGHT;  Surgeon: Irene Limbo, MD;  Location: [place];  Service: Plastics;  Laterality: Right;  . THYROIDECTOMY N/A 09/22/2012   Procedure: RIGHT THYROID LOBECTOMY WITH FROZEN SECTION;  Surgeon: Izora Gala, MD;  Location: [place];  Service: ENT;  Laterality: N/A;  . THYROIDECTOMY Left 10/21/2012   Procedure: COMPLETION OF THYROIDECTOMY;  Surgeon: Izora Gala, MD;  Location: [place];  Service: ENT;  Laterality: Left;  . TOTAL KNEE ARTHROPLASTY  04/14/2012   Procedure: TOTAL KNEE ARTHROPLASTY;  Surgeon: Ninetta Lights, MD;  Location: [place];   Service: Orthopedics;  Laterality: Right;  RIGHT ARTHROPLASTY KNEE MEDIAL/LATERAL COMPARTMENTS WITH PATELLA RESURFACING  . TOTAL KNEE ARTHROPLASTY Left 03/30/2013   Procedure: TOTAL KNEE ARTHROPLASTY;  Surgeon: Ninetta Lights, MD;  Location: [place];  Service: Orthopedics;  Laterality: Left;    There were no vitals filed for this visit.  Subjective Assessment - 02/11/18 1403    Subjective  Patient reports significant relief in elbow after iontophoresis.    Pertinent History  Breast CA 15 yr ago, thryroid CA, DCIS (precancer);  ant cerv fusion 2008    Patient Stated Goals  get rid of pain    Currently in Pain?  Yes    Pain Score  3     Pain Location  Hand    Pain Orientation  Left    Pain Descriptors / Indicators  Aching    Pain Type  Chronic pain    Pain Onset  More than a month ago    Pain Score  0                       [facility] Adult PT Treatment/Exercise - 02/11/18 0001      Modalities   Modalities  Iontophoresis      Iontophoresis   Type of Iontophoresis  Dexamethasone    Location  R medial epicondyle    Dose  102ml    Time  4 hour patch      Manual Therapy   Manual Therapy  Soft tissue mobilization;Passive ROM    Soft tissue mobilization  left hand and R forearm flexors and extensors; R triceps distally    Passive ROM  passive stretch to wrist flexors/extensors       Trigger Point Dry Needling - 02/11/18 1602    Consent Given?  Yes    Education Handout Provided  Yes   reviewed with pt; received h/o previously   Muscles Treated Upper Body  --   R ABD/ADD pollicis, 2nd interosseus; L wrist extensors            PT Short Term Goals - 02/05/18 1003      PT SHORT TERM GOAL #1   Title  Ind with initial HEP    Time  2    Period  Weeks    Status  New    Target Date  02/19/18      PT SHORT TERM GOAL #2   Title  decreased pain in the R elbow by 50% with ADLS    Time  4    Period  Weeks    Status  New    Target Date  03/05/18      PT SHORT TERM  GOAL #3   Title  decreased pain in R hand by 50% with ADLS    Time  4    Period  Weeks  Status  New    Target Date  03/05/18        PT Long Term Goals - 02/05/18 1004      PT LONG TERM GOAL #1   Title  Patient able to perform ADLs with R elbow and L hand pain of 2/10 or less    Time  8    Period  Weeks    Status  New    Target Date  04/02/18      PT LONG TERM GOAL #2   Title  Patient to demo 5/5 R wrist strength to ease ADLS    Time  8    Period  Weeks    Status  New      PT LONG TERM GOAL #3   Title  Pt independent and compliant in HEP to prevent further injury to her elbow and hand.    Time  8    Period  Weeks    Status  New      PT LONG TERM GOAL #4   Title  FOTO functional outcome improved to 37% indicating improved function with less pain     Time  8    Period  Weeks    Status  New      PT LONG TERM GOAL #5   Title  ---------------------------------------              Patient will benefit from skilled therapeutic intervention in order to improve the following deficits and impairments:     Visit Diagnosis: Pain in right elbow  Thumb pain, left     Problem List Patient Active Problem List   Diagnosis Date Noted  . History of breast cancer in female 01/27/2018  . History of reconstruction of both breasts 01/27/2018  . Acquired absence of right breast 09/23/2017  . Breast cancer ([place]) 06/22/2017  . 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 Genetic testing 04/17/2017  . DJD (degenerative joint disease) of knee 03/30/2013  . Unspecified sleep apnea 03/23/2013  . 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 Weakness 11/28/2011  . Hypertension 11/28/2011  . Hypokalemia 11/28/2011  . LUE weakness 11/28/2011  . Hypothyroidism 11/28/2011  . Thyroid cancer ([place]) 04/28/2010    Mujtaba Bollig PT 02/11/2018, 4:06 PM  [facility] [address], [place], [place], [address] Phone:  [phone]   Fax:  [phone]  Name: [name] MRN: [id number] Date of Birth: [birth year]

## 2018-02-17 ENCOUNTER — Encounter: Payer: Medicare Other | Admitting: Physical Therapy

## 2018-02-18 ENCOUNTER — Ambulatory Visit: Payer: Medicare Other | Admitting: Physical Therapy

## 2018-02-18 ENCOUNTER — Encounter: Payer: Self-pay | Admitting: Physical Therapy

## 2018-02-18 DIAGNOSIS — M79645 Pain in left finger(s): Secondary | ICD-10-CM

## 2018-02-18 DIAGNOSIS — M6281 Muscle weakness (generalized): Secondary | ICD-10-CM | POA: Diagnosis not present

## 2018-02-18 DIAGNOSIS — M25521 Pain in right elbow: Secondary | ICD-10-CM

## 2018-02-18 NOTE — Therapy (Signed)
[facility] [address] [place], [place], [address] Phone: [phone]   Fax:  [phone]  Physical Therapy Treatment  Patient Details  Name: [name] MRN: [id number] Date of Birth: 1950/05/29 Referring Provider (PT): Edmonia Lynch   Encounter Date: 02/18/2018  PT End of Session - 02/18/18 2032    Visit Number  3    Date for PT Re-Evaluation  04/02/18    Authorization Type  MCR    PT Start Time  1225    PT Stop Time  1310   DN;  pt talked several minutes about her son   PT Time Calculation (min)  45 min       Past Medical History:  Diagnosis Date  . Anemia    "years ago"  . Arthritis    "left hand; was in my knees" (09/24/2017)  . Breast cancer, right breast ([place]) 2004  . Bruises easily   . Chronic lower back pain   . Ductal carcinoma in situ (DCIS) of left breast 2018  . Genetic testing 04/17/2017   STAT Breast panel with reflex to Multi-Cancer panel (83 genes) @ [facility] - No pathogenic mutations detected  . History of bronchitis    when she was young  . Hypertension    no longer taking medications as of August 2019  . Hypothyroidism    takes Synthroid daily  . Joint pain   . Joint swelling   . Personal history of chemotherapy 04/2003  . Personal history of radiation therapy 2005  . Pneumonia    hx of when she was young  . Right elbow tendonitis   . Sleep apnea    has a machine but does not use (09/24/2017)  . Thyroid cancer ([place]) 2012  . Urinary frequency     Past Surgical History:  Procedure Laterality Date  . ANTERIOR CERVICAL DECOMP/DISCECTOMY FUSION  2008  . APPENDECTOMY  2007  . BACK SURGERY    . BREAST BIOPSY Right 02/28/2003   malignant  . BREAST BIOPSY Left 03/2017  . BREAST LUMPECTOMY Right 2004  . BREAST RECONSTRUCTION WITH PLACEMENT OF TISSUE EXPANDER AND FLEX HD (ACELLULAR HYDRATED DERMIS) Bilateral 06/22/2017   Procedure: IMMEDIATE BILATERAL BREAST RECONSTRUCTION  WITH PLACEMENT OF TISSUE EXPANDER AND FLEX HD (ACELLULAR HYDRATED DERMIS);  Surgeon: Wallace Going, DO;  Location: [place];  Service: Plastics;  Laterality: Bilateral;  . CESAREAN SECTION  1981  . COLONOSCOPY  X 2  . JOINT REPLACEMENT    . KNEE ARTHROSCOPY Right 2007  . KNEE ARTHROSCOPY  05/02/2011   Procedure: ARTHROSCOPY KNEE;  Surgeon: Johnn Hai;  Location: [place];  Service: Orthopedics;  Laterality: Right;  . LATISSIMUS FLAP TO BREAST Right 09/23/2017   Procedure: RIGHT BREAST LATISSIMUS DORSI FLAP RECONSTRUCTION WITH TISSUE EXPANDER;  Surgeon: Wallace Going, DO;  Location: [place];  Service: Plastics;  Laterality: Right;  . MASTECTOMY W/ SENTINEL NODE BIOPSY Bilateral 06/22/2017   Procedure: LEFT MASTECTOMY WITH SENTINEL LYMPH NODE BIOPSY AND RIGHT PROPHYLACTIC MASTECTOMY;  Surgeon: Jovita Kussmaul, MD;  Location: [place];  Service: General;  Laterality: Bilateral;  . MENISCUS DEBRIDEMENT  05/02/2011   Procedure: DEBRIDEMENT OF MENISCUS;  Surgeon: Johnn Hai;  Location: [place];  Service: Orthopedics;  Laterality: Right;  . PORTA CATH REMOVAL  2005  . PORTACATH PLACEMENT  2004  . REMOVAL OF TISSUE EXPANDER AND PLACEMENT OF IMPLANT Right 12/12/2017   Procedure: REMOVAL  OF TISSUE EXPANDER, RIGHT;  Surgeon: Irene Limbo, MD;  Location: [place];  Service: Plastics;  Laterality: Right;  . THYROIDECTOMY N/A 09/22/2012   Procedure: RIGHT THYROID LOBECTOMY WITH FROZEN SECTION;  Surgeon: Izora Gala, MD;  Location: [place];  Service: ENT;  Laterality: N/A;  . THYROIDECTOMY Left 10/21/2012   Procedure: COMPLETION OF THYROIDECTOMY;  Surgeon: Izora Gala, MD;  Location: [place];  Service: ENT;  Laterality: Left;  . TOTAL KNEE ARTHROPLASTY  04/14/2012   Procedure: TOTAL KNEE ARTHROPLASTY;  Surgeon: Ninetta Lights, MD;  Location: [place];  Service: Orthopedics;  Laterality: Right;  RIGHT ARTHROPLASTY KNEE  MEDIAL/LATERAL COMPARTMENTS WITH PATELLA RESURFACING  . TOTAL KNEE ARTHROPLASTY Left 03/30/2013   Procedure: TOTAL KNEE ARTHROPLASTY;  Surgeon: Ninetta Lights, MD;  Location: [place];  Service: Orthopedics;  Laterality: Left;    There were no vitals filed for this visit.  Subjective Assessment - 02/18/18 1223    Subjective  Going to have breast surgery in early November.  Working 2 days a week now.   Left thumb pain.  Right elbow not really hurting today.   Patient reports her son died recently.      Pertinent History  Breast CA 15 yr ago, thryroid CA, DCIS (precancer);  ant cerv fusion 2008    Currently in Pain?  Yes    Pain Score  3     Pain Location  Hand    Pain Orientation  Left                       [facility] Adult PT Treatment/Exercise - 02/18/18 0001      Iontophoresis   Type of Iontophoresis  Dexamethasone    Location  R medial epicondyle    Dose  1 ml/mg 40 mAmin    Time  4 hour patch      Manual Therapy   Soft tissue mobilization  left thumb musculature     Passive ROM  passive ROM left thumb       Trigger Point Dry Needling - 02/18/18 2031    Consent Given?  Yes    Muscles Treated Upper Body  --   left abductor and adductor pollicis            PT Short Term Goals - 02/05/18 1003      PT SHORT TERM GOAL #1   Title  Ind with initial HEP    Time  2    Period  Weeks    Status  New    Target Date  02/19/18      PT SHORT TERM GOAL #2   Title  decreased pain in the R elbow by 50% with ADLS    Time  4    Period  Weeks    Status  New    Target Date  03/05/18      PT SHORT TERM GOAL #3   Title  decreased pain in R hand by 50% with ADLS    Time  4    Period  Weeks    Status  New    Target Date  03/05/18        PT Long Term Goals - 02/05/18 1004      PT LONG TERM GOAL #1   Title  Patient able to perform ADLs with R elbow and L hand pain of 2/10 or less    Time  8    Period  Weeks    Status  New    Target Date  04/02/18      PT  LONG TERM GOAL #2   Title  Patient to demo 5/5 R wrist strength to ease ADLS    Time  8    Period  Weeks    Status  New      PT LONG TERM GOAL #3   Title  Pt independent and compliant in HEP to prevent further injury to her elbow and hand.    Time  8    Period  Weeks    Status  New      PT LONG TERM GOAL #4   Title  FOTO functional outcome improved to 37% indicating improved function with less pain     Time  8    Period  Weeks    Status  New      PT LONG TERM GOAL #5   Title  ---------------------------------------            Plan - 02/18/18 2036    Clinical Impression Statement  The patient reports she will be limited in how long she can come to PT secondary to her upcoming surgery.  She has also been dealing with recent unexpected death of her son.  She reports her right elbow has been doing well with the recent DN and ionto patch and she requests treatment focus on her left thumb.  Therapist closely monitoring response throught treatment session.      Rehab Potential  Excellent    PT Frequency  2x / week    PT Duration  8 weeks    PT Treatment/Interventions  ADLs/Self Care Home Management;Cryotherapy;Electrical Stimulation;Iontophoresis 4mg /ml Dexamethasone;Moist Heat;Ultrasound;Patient/family education;Therapeutic exercise;Manual techniques;Dry needling;Taping    PT Next Visit Plan  continue ionto; DN to R wrist flex/extensors and Left thumb muscles; wrist strengthening/ROM       Patient will benefit from skilled therapeutic intervention in order to improve the following deficits and impairments:  Pain, Decreased range of motion, Impaired flexibility, Decreased strength  Visit Diagnosis: Pain in right elbow  Thumb pain, left     Problem List Patient Active Problem List   Diagnosis Date Noted  . History of breast cancer in female 01/27/2018  . History of reconstruction of both breasts 01/27/2018  . Acquired absence of right breast 09/23/2017  . Breast cancer  ([place]) 06/22/2017  . 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 Genetic testing 04/17/2017  . DJD (degenerative joint disease) of knee 03/30/2013  . Unspecified sleep apnea 03/23/2013  . 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 Weakness 11/28/2011  . Hypertension 11/28/2011  . Hypokalemia 11/28/2011  . LUE weakness 11/28/2011  . Hypothyroidism 11/28/2011  . Thyroid cancer ([place]) 04/28/2010   Ruben Im, PT 02/18/18 8:46 PM Phone: [phone] Fax: [phone]  Alvera Singh 02/18/2018, 8:45 PM  [facility] [address] [place], [place], [address] Phone: [phone]   Fax:  [phone]  Name: [name] MRN: [id number] Date of Birth: 1951/01/16

## 2018-02-19 ENCOUNTER — Encounter: Payer: Self-pay | Admitting: Physical Therapy

## 2018-02-19 ENCOUNTER — Encounter

## 2018-02-19 ENCOUNTER — Ambulatory Visit: Payer: Medicare Other | Admitting: Physical Therapy

## 2018-02-19 DIAGNOSIS — M25521 Pain in right elbow: Secondary | ICD-10-CM | POA: Diagnosis not present

## 2018-02-19 DIAGNOSIS — M6281 Muscle weakness (generalized): Secondary | ICD-10-CM | POA: Diagnosis not present

## 2018-02-19 DIAGNOSIS — M79645 Pain in left finger(s): Secondary | ICD-10-CM | POA: Diagnosis not present

## 2018-02-19 NOTE — Therapy (Signed)
[facility] [address] [place], [place], [address] Phone: [phone]   Fax:  [phone]  Physical Therapy Treatment  Patient Details  Name: [name] MRN: [id number] Date of Birth: June 19, 1950 Referring Provider (PT): Edmonia Lynch   Encounter Date: 02/19/2018  PT End of Session - 02/19/18 1212    Visit Number  4    Date for PT Re-Evaluation  04/02/18    Authorization Type  MCR    PT Start Time  0845    PT Stop Time  0930   dry needling,  talking about her son's recent death    PT Time Calculation (min)  45 min    Activity Tolerance  Patient tolerated treatment well       Past Medical History:  Diagnosis Date  . Anemia    "years ago"  . Arthritis    "left hand; was in my knees" (09/24/2017)  . Breast cancer, right breast ([place]) 2004  . Bruises easily   . Chronic lower back pain   . Ductal carcinoma in situ (DCIS) of left breast 2018  . Genetic testing 04/17/2017   STAT Breast panel with reflex to Multi-Cancer panel (83 genes) @ [facility] - No pathogenic mutations detected  . History of bronchitis    when she was young  . Hypertension    no longer taking medications as of August 2019  . Hypothyroidism    takes Synthroid daily  . Joint pain   . Joint swelling   . Personal history of chemotherapy 04/2003  . Personal history of radiation therapy 2005  . Pneumonia    hx of when she was young  . Right elbow tendonitis   . Sleep apnea    has a machine but does not use (09/24/2017)  . Thyroid cancer ([place]) 2012  . Urinary frequency     Past Surgical History:  Procedure Laterality Date  . ANTERIOR CERVICAL DECOMP/DISCECTOMY FUSION  2008  . APPENDECTOMY  2007  . BACK SURGERY    . BREAST BIOPSY Right 02/28/2003   malignant  . BREAST BIOPSY Left 03/2017  . BREAST LUMPECTOMY Right 2004  . BREAST RECONSTRUCTION WITH PLACEMENT OF TISSUE EXPANDER AND FLEX HD (ACELLULAR HYDRATED DERMIS) Bilateral  06/22/2017   Procedure: IMMEDIATE BILATERAL BREAST RECONSTRUCTION WITH PLACEMENT OF TISSUE EXPANDER AND FLEX HD (ACELLULAR HYDRATED DERMIS);  Surgeon: Wallace Going, DO;  Location: [place];  Service: Plastics;  Laterality: Bilateral;  . CESAREAN SECTION  1981  . COLONOSCOPY  X 2  . JOINT REPLACEMENT    . KNEE ARTHROSCOPY Right 2007  . KNEE ARTHROSCOPY  05/02/2011   Procedure: ARTHROSCOPY KNEE;  Surgeon: Johnn Hai;  Location: [place];  Service: Orthopedics;  Laterality: Right;  . LATISSIMUS FLAP TO BREAST Right 09/23/2017   Procedure: RIGHT BREAST LATISSIMUS DORSI FLAP RECONSTRUCTION WITH TISSUE EXPANDER;  Surgeon: Wallace Going, DO;  Location: [place];  Service: Plastics;  Laterality: Right;  . MASTECTOMY W/ SENTINEL NODE BIOPSY Bilateral 06/22/2017   Procedure: LEFT MASTECTOMY WITH SENTINEL LYMPH NODE BIOPSY AND RIGHT PROPHYLACTIC MASTECTOMY;  Surgeon: Jovita Kussmaul, MD;  Location: [place];  Service: General;  Laterality: Bilateral;  . MENISCUS DEBRIDEMENT  05/02/2011   Procedure: DEBRIDEMENT OF MENISCUS;  Surgeon: Johnn Hai;  Location: [place];  Service: Orthopedics;  Laterality: Right;  . PORTA CATH REMOVAL  2005  . PORTACATH PLACEMENT  2004  . REMOVAL OF TISSUE  EXPANDER AND PLACEMENT OF IMPLANT Right 12/12/2017   Procedure: REMOVAL OF TISSUE EXPANDER, RIGHT;  Surgeon: Irene Limbo, MD;  Location: [place];  Service: Plastics;  Laterality: Right;  . THYROIDECTOMY N/A 09/22/2012   Procedure: RIGHT THYROID LOBECTOMY WITH FROZEN SECTION;  Surgeon: Izora Gala, MD;  Location: [place];  Service: ENT;  Laterality: N/A;  . THYROIDECTOMY Left 10/21/2012   Procedure: COMPLETION OF THYROIDECTOMY;  Surgeon: Izora Gala, MD;  Location: [place];  Service: ENT;  Laterality: Left;  . TOTAL KNEE ARTHROPLASTY  04/14/2012   Procedure: TOTAL KNEE ARTHROPLASTY;  Surgeon: Ninetta Lights, MD;  Location: [place];  Service:  Orthopedics;  Laterality: Right;  RIGHT ARTHROPLASTY KNEE MEDIAL/LATERAL COMPARTMENTS WITH PATELLA RESURFACING  . TOTAL KNEE ARTHROPLASTY Left 03/30/2013   Procedure: TOTAL KNEE ARTHROPLASTY;  Surgeon: Ninetta Lights, MD;  Location: [place];  Service: Orthopedics;  Laterality: Left;    There were no vitals filed for this visit.  Subjective Assessment - 02/19/18 0846    Subjective  She reports a good response to DN of left thumb musculature and also benefit from  ionto patch to right elbow.  She is trying to get in 2 more appointments here before her right breast surgery in Mar 26, 2023.  Her son recently died unexpectedly.    Pertinent History  Breast CA 15 yr ago, thryroid CA, DCIS (precancer);  ant cerv fusion 2008    Currently in Pain?  Yes    Pain Score  2     Pain Location  Elbow    Pain Orientation  Right    Pain Type  Chronic pain    Aggravating Factors   desk work     Pain Relieving Factors  DN, ionto to right elbow                       [facility] Adult PT Treatment/Exercise - 02/19/18 0001      Iontophoresis   Type of Iontophoresis  Dexamethasone    Location  R medial epicondyle    Dose  1 ml/mg 40 mAmin    Time  4 hour patch      Manual Therapy   Soft tissue mobilization  instrument assisted soft tissue mobilization to wrist flexors, flexors, supinators and pronator muscles        Trigger Point Dry Needling - 02/19/18 1210    Consent Given?  Yes    Muscles Treated Upper Body  --   right flexor carpi radialis,ulnaris, flexor dig super,pronat            PT Short Term Goals - 02/05/18 1003      PT SHORT TERM GOAL #1   Title  Ind with initial HEP    Time  2    Period  Weeks    Status  New    Target Date  02/19/18      PT SHORT TERM GOAL #2   Title  decreased pain in the R elbow by 50% with ADLS    Time  4    Period  Weeks    Status  New    Target Date  03/05/18      PT SHORT TERM GOAL #3   Title  decreased pain in R hand by 50% with ADLS     Time  4    Period  Weeks    Status  New    Target Date  03/05/18        PT Long  Term Goals - 02/05/18 1004      PT LONG TERM GOAL #1   Title  Patient able to perform ADLs with R elbow and L hand pain of 2/10 or less    Time  8    Period  Weeks    Status  New    Target Date  04/02/18      PT LONG TERM GOAL #2   Title  Patient to demo 5/5 R wrist strength to ease ADLS    Time  8    Period  Weeks    Status  New      PT LONG TERM GOAL #3   Title  Pt independent and compliant in HEP to prevent further injury to her elbow and hand.    Time  8    Period  Weeks    Status  New      PT LONG TERM GOAL #4   Title  FOTO functional outcome improved to 37% indicating improved function with less pain     Time  8    Period  Weeks    Status  New      PT LONG TERM GOAL #5   Title  ---------------------------------------            Plan - 02/19/18 1213    Clinical Impression Statement  The patient reports she continues to benefit from DN and manual therapy to left thumb and right medial elbow musculature.  Treatment focus on right elbow today since focus was on left thumb yesterday.  Good response to iontophoresis as well and no skin irritation noted.  She hopes to have 2 more PT visits before her upcoming breast reconstruction surgery.   Therapist closely monitoring response with all treatment interventions.      Rehab Potential  Excellent    PT Frequency  2x / week    PT Duration  8 weeks    PT Treatment/Interventions  ADLs/Self Care Home Management;Cryotherapy;Electrical Stimulation;Iontophoresis 4mg /ml Dexamethasone;Moist Heat;Ultrasound;Patient/family education;Therapeutic exercise;Manual techniques;Dry needling;Taping    PT Next Visit Plan  continue ionto; DN to R wrist flex/extensors and Left thumb muscles; wrist strengthening/ROM       Patient will benefit from skilled therapeutic intervention in order to improve the following deficits and impairments:  Pain, Decreased  range of motion, Impaired flexibility, Decreased strength  Visit Diagnosis: Pain in right elbow     Problem List Patient Active Problem List   Diagnosis Date Noted  . History of breast cancer in female 01/27/2018  . History of reconstruction of both breasts 01/27/2018  . Acquired absence of right breast 09/23/2017  . Breast cancer ([place]) 06/22/2017  . 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 Genetic testing 04/17/2017  . DJD (degenerative joint disease) of knee 03/30/2013  . Unspecified sleep apnea 03/23/2013  . 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 Weakness 11/28/2011  . Hypertension 11/28/2011  . Hypokalemia 11/28/2011  . LUE weakness 11/28/2011  . Hypothyroidism 11/28/2011  . Thyroid cancer ([place]) 04/28/2010   Ruben Im, PT 02/19/18 12:21 PM Phone: [phone] Fax: [phone]  Alvera Singh 02/19/2018, 12:21 PM  [facility] [address] [place], [place], [address] Phone: [phone]   Fax:  [phone]  Name: JARI CAROLLO MRN: [id number] Date of Birth: 26-Apr-1951

## 2018-02-23 ENCOUNTER — Other Ambulatory Visit: Payer: Self-pay

## 2018-02-23 ENCOUNTER — Encounter (HOSPITAL_BASED_OUTPATIENT_CLINIC_OR_DEPARTMENT_OTHER): Payer: Self-pay | Admitting: *Deleted

## 2018-02-24 ENCOUNTER — Encounter: Payer: Self-pay | Admitting: Physical Therapy

## 2018-02-24 ENCOUNTER — Ambulatory Visit: Payer: Medicare Other | Admitting: Physical Therapy

## 2018-02-24 DIAGNOSIS — M79645 Pain in left finger(s): Secondary | ICD-10-CM | POA: Diagnosis not present

## 2018-02-24 DIAGNOSIS — M6281 Muscle weakness (generalized): Secondary | ICD-10-CM | POA: Diagnosis not present

## 2018-02-24 DIAGNOSIS — M25521 Pain in right elbow: Secondary | ICD-10-CM

## 2018-02-24 NOTE — Therapy (Signed)
[facility] [address] [place], [place], [address] Phone: [phone]   Fax:  [phone]  Physical Therapy Treatment  Patient Details  Name: [name] MRN: [id number] Date of Birth: [birth year] Referring Provider (PT): Edmonia Lynch   Encounter Date: 02/24/2018  PT End of Session - 02/24/18 0930    Visit Number  5    Date for PT Re-Evaluation  04/02/18    Authorization Type  MCR    PT Start Time  0930    PT Stop Time  1016    PT Time Calculation (min)  46 min    Activity Tolerance  Patient tolerated treatment well    Behavior During Therapy  [facility] for tasks assessed/performed       Past Medical History:  Diagnosis Date  . Anemia    "years ago"  . Arthritis    "left hand; was in my knees" (09/24/2017)  . Breast cancer, right breast ([place]) 2004  . Bruises easily   . Chronic lower back pain   . Ductal carcinoma in situ (DCIS) of left breast 2018  . Genetic testing 04/17/2017   STAT Breast panel with reflex to Multi-Cancer panel (83 genes) @ [facility] - No pathogenic mutations detected  . History of bronchitis    when she was young  . Hypertension    no longer taking medications as of August 2019  . Hypothyroidism    takes Synthroid daily  . Joint pain   . Joint swelling   . Personal history of chemotherapy 04/2003  . Personal history of radiation therapy 2005  . Pneumonia    hx of when she was young  . Right elbow tendonitis   . Sleep apnea    has a machine but does not use (09/24/2017)  . Thyroid cancer ([place]) 2012  . Urinary frequency     Past Surgical History:  Procedure Laterality Date  . ANTERIOR CERVICAL DECOMP/DISCECTOMY FUSION  2008  . APPENDECTOMY  2007  . BREAST BIOPSY Right 02/28/2003   malignant  . BREAST BIOPSY Left 03/2017  . BREAST LUMPECTOMY Right 2004  . BREAST RECONSTRUCTION WITH PLACEMENT OF TISSUE EXPANDER AND FLEX HD (ACELLULAR HYDRATED DERMIS) Bilateral 06/22/2017   Procedure: IMMEDIATE BILATERAL BREAST RECONSTRUCTION WITH PLACEMENT OF TISSUE EXPANDER AND FLEX HD (ACELLULAR HYDRATED DERMIS);  Surgeon: Wallace Going, DO;  Location: [place];  Service: Plastics;  Laterality: Bilateral;  . CESAREAN SECTION  1981  . COLONOSCOPY  X 2  . JOINT REPLACEMENT    . KNEE ARTHROSCOPY Right 2007  . KNEE ARTHROSCOPY  05/02/2011   Procedure: ARTHROSCOPY KNEE;  Surgeon: Johnn Hai;  Location: [place];  Service: Orthopedics;  Laterality: Right;  . LATISSIMUS FLAP TO BREAST Right 09/23/2017   Procedure: RIGHT BREAST LATISSIMUS DORSI FLAP RECONSTRUCTION WITH TISSUE EXPANDER;  Surgeon: Wallace Going, DO;  Location: [place];  Service: Plastics;  Laterality: Right;  . MASTECTOMY W/ SENTINEL NODE BIOPSY Bilateral 06/22/2017   Procedure: LEFT MASTECTOMY WITH SENTINEL LYMPH NODE BIOPSY AND RIGHT PROPHYLACTIC MASTECTOMY;  Surgeon: Jovita Kussmaul, MD;  Location: [place];  Service: General;  Laterality: Bilateral;  . MENISCUS DEBRIDEMENT  05/02/2011   Procedure: DEBRIDEMENT OF MENISCUS;  Surgeon: Johnn Hai;  Location: [place];  Service: Orthopedics;  Laterality: Right;  . PORTA CATH REMOVAL  2005  . PORTACATH PLACEMENT  2004  . REMOVAL OF TISSUE EXPANDER AND PLACEMENT OF IMPLANT Right 12/12/2017  Procedure: REMOVAL OF TISSUE EXPANDER, RIGHT;  Surgeon: Irene Limbo, MD;  Location: [place];  Service: Plastics;  Laterality: Right;  . THYROIDECTOMY N/A 09/22/2012   Procedure: RIGHT THYROID LOBECTOMY WITH FROZEN SECTION;  Surgeon: Izora Gala, MD;  Location: [place];  Service: ENT;  Laterality: N/A;  . THYROIDECTOMY Left 10/21/2012   Procedure: COMPLETION OF THYROIDECTOMY;  Surgeon: Izora Gala, MD;  Location: [place];  Service: ENT;  Laterality: Left;  . TOTAL KNEE ARTHROPLASTY  04/14/2012   Procedure: TOTAL KNEE ARTHROPLASTY;  Surgeon: Ninetta Lights, MD;  Location: [place];  Service: Orthopedics;   Laterality: Right;  RIGHT ARTHROPLASTY KNEE MEDIAL/LATERAL COMPARTMENTS WITH PATELLA RESURFACING  . TOTAL KNEE ARTHROPLASTY Left 03/30/2013   Procedure: TOTAL KNEE ARTHROPLASTY;  Surgeon: Ninetta Lights, MD;  Location: [place];  Service: Orthopedics;  Laterality: Left;    There were no vitals filed for this visit.  Subjective Assessment - 02/24/18 0931    Subjective  Pt is feeling better with PT.  She would like to focus on Rt elbow today.  She felt several occurances of "zinging" pain along medial elbow with certain movements yesterday and today.  Lt thumb is much improved as long as she doesn't use her Lt hand too much.    Pertinent History  Breast CA 15 yr ago, thryroid CA, DCIS (precancer);  ant cerv fusion 2008    Patient Stated Goals  get rid of pain    Currently in Pain?  Yes    Pain Score  7     Pain Location  Elbow    Pain Orientation  Right    Pain Descriptors / Indicators  Sharp;Sore    Pain Type  Chronic pain    Pain Onset  More than a month ago    Pain Frequency  Constant                       [facility] Adult PT Treatment/Exercise - 02/24/18 0001      Exercises   Exercises  Elbow      Elbow Exercises   Other elbow exercises  Rt eccentric only wrist flexion with manual assist with Lt hand to perform Rt passive concentric phase, 1lb, 20 reps (added to HEP)      Iontophoresis   Type of Iontophoresis  Dexamethasone    Location  R medial epicondyle    Dose  1 ml/mg 40 mAmin    Time  4 hour patch      Manual Therapy   Manual Therapy  Soft tissue mobilization    Manual therapy comments  Rt wrist flexors after dry needling       Trigger Point Dry Needling - 02/24/18 1205    Consent Given?  Yes    Muscles Treated Upper Body  --   Rt wrist flexors: flexor carpi ulnaris, flexor digitorum            PT Short Term Goals - 02/05/18 1003      PT SHORT TERM GOAL #1   Title  Ind with initial HEP    Time  2    Period  Weeks    Status  New    Target  Date  02/19/18      PT SHORT TERM GOAL #2   Title  decreased pain in the R elbow by 50% with ADLS    Time  4    Period  Weeks    Status  New  Target Date  03/05/18      PT SHORT TERM GOAL #3   Title  decreased pain in R hand by 50% with ADLS    Time  4    Period  Weeks    Status  New    Target Date  03/05/18        PT Long Term Goals - 02/05/18 1004      PT LONG TERM GOAL #1   Title  Patient able to perform ADLs with R elbow and L hand pain of 2/10 or less    Time  8    Period  Weeks    Status  New    Target Date  04/02/18      PT LONG TERM GOAL #2   Title  Patient to demo 5/5 R wrist strength to ease ADLS    Time  8    Period  Weeks    Status  New      PT LONG TERM GOAL #3   Title  Pt independent and compliant in HEP to prevent further injury to her elbow and hand.    Time  8    Period  Weeks    Status  New      PT LONG TERM GOAL #4   Title  FOTO functional outcome improved to 37% indicating improved function with less pain     Time  8    Period  Weeks    Status  New      PT LONG TERM GOAL #5   Title  ---------------------------------------            Plan - 02/24/18 1208    Clinical Impression Statement  Pt with improved pain and soft tissue extensibility with dry needling to Rt wrist flexors today.  She has reported improved pain in both Lt thumb and right elbow with PT.  PT used another ionto patch as she has had improved tenderness of medial epicondyle and insertion of flexor tendons with use at previous appointments.  She demonstrated good understanding of new eccentric wrist flexor exercise added to HEP today.  She will continue to benefit from skilled intervention to address her pain and deficits.    Rehab Potential  Excellent    PT Frequency  2x / week    PT Duration  8 weeks    PT Treatment/Interventions  ADLs/Self Care Home Management;Cryotherapy;Electrical Stimulation;Iontophoresis 4mg /ml Dexamethasone;Moist Heat;Ultrasound;Patient/family  education;Therapeutic exercise;Manual techniques;Dry needling;Taping    PT Next Visit Plan  continue ionto; DN to R wrist flex/extensors and Left thumb muscles; wrist strengthening/ROM    PT Home Exercise Plan  Access Code: [email or web address]: eccentric strength of Rt wrist flexors    Consulted and Agree with Plan of Care  Patient       Patient will benefit from skilled therapeutic intervention in order to improve the following deficits and impairments:  Pain, Decreased range of motion, Impaired flexibility, Decreased strength  Visit Diagnosis: Pain in right elbow  Muscle weakness (generalized)     Problem List Patient Active Problem List   Diagnosis Date Noted  . History of breast cancer in female 01/27/2018  . History of reconstruction of both breasts 01/27/2018  . Acquired absence of right breast 09/23/2017  . Breast cancer ([place]) 06/22/2017  . 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 Genetic testing 04/17/2017  . DJD (degenerative joint disease) of knee 03/30/2013  . Unspecified sleep apnea 03/23/2013  . 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  Weakness 11/28/2011  . Hypertension 11/28/2011  . Hypokalemia 11/28/2011  . LUE weakness 11/28/2011  . Hypothyroidism 11/28/2011  . Thyroid cancer ([place]) 04/28/2010   Baruch Merl, PT 02/24/18 12:18 PM   [facility] [address] [place], [place], [address] Phone: [phone]   Fax:  [phone]  Name: MIKEA QUADROS MRN: [id number] Date of Birth: [birth year]

## 2018-02-24 NOTE — Patient Instructions (Signed)
Access Code: [email or web address]  URL: [email or web address]  Date: 02/24/2018  Prepared by: Venetia Night Henson Fraticelli   Exercises  Seated Eccentric Wrist Flexion with Dumbbell - 20 reps - 3 sets - 1x daily - 7x weekly

## 2018-02-26 ENCOUNTER — Ambulatory Visit: Payer: Medicare Other | Attending: Orthopedic Surgery | Admitting: Physical Therapy

## 2018-02-26 ENCOUNTER — Encounter (HOSPITAL_BASED_OUTPATIENT_CLINIC_OR_DEPARTMENT_OTHER)
Admission: RE | Admit: 2018-02-26 | Discharge: 2018-02-26 | Disposition: A | Discharge status: Home / Self Care | Payer: Medicare Other | Source: Ambulatory Visit | Attending: Plastic Surgery | Admitting: Plastic Surgery

## 2018-02-26 ENCOUNTER — Encounter: Payer: Self-pay | Admitting: Physical Therapy

## 2018-02-26 ENCOUNTER — Ambulatory Visit (INDEPENDENT_AMBULATORY_CARE_PROVIDER_SITE_OTHER): Payer: Self-pay | Admitting: Plastic Surgery

## 2018-02-26 ENCOUNTER — Encounter: Payer: Self-pay | Admitting: Plastic Surgery

## 2018-02-26 VITALS — BP 122/78 | HR 83 | Ht 64.0 in | Wt 218.0 lb

## 2018-02-26 DIAGNOSIS — Z9011 Acquired absence of right breast and nipple: Secondary | ICD-10-CM

## 2018-02-26 DIAGNOSIS — M79645 Pain in left finger(s): Secondary | ICD-10-CM | POA: Diagnosis not present

## 2018-02-26 DIAGNOSIS — Z9889 Other specified postprocedural states: Secondary | ICD-10-CM

## 2018-02-26 DIAGNOSIS — Z853 Personal history of malignant neoplasm of breast: Secondary | ICD-10-CM

## 2018-02-26 DIAGNOSIS — M6281 Muscle weakness (generalized): Secondary | ICD-10-CM | POA: Insufficient documentation

## 2018-02-26 DIAGNOSIS — Z01812 Encounter for preprocedural laboratory examination: Secondary | ICD-10-CM | POA: Diagnosis not present

## 2018-02-26 DIAGNOSIS — M25521 Pain in right elbow: Secondary | ICD-10-CM | POA: Insufficient documentation

## 2018-02-26 DIAGNOSIS — D0512 Intraductal carcinoma in situ of left breast: Secondary | ICD-10-CM

## 2018-02-26 DIAGNOSIS — Z9882 Breast implant status: Secondary | ICD-10-CM

## 2018-02-26 LAB — BASIC METABOLIC PANEL
Anion gap: 8 (ref 5–15)
BUN: 12 mg/dL (ref 8–23)
CHLORIDE: 101 mmol/L (ref 98–111)
CO2: 30 mmol/L (ref 22–32)
CREATININE: 0.95 mg/dL (ref 0.44–1.00)
Calcium: 9.3 mg/dL (ref 8.9–10.3)
GFR calc Af Amer: >60 mL/min (ref >60)
GLUCOSE: 93 mg/dL (ref 70–99)
Potassium: 2.5 mmol/L — CL (ref 3.5–5.1)
Sodium: 139 mmol/L (ref 135–145)

## 2018-02-26 NOTE — Progress Notes (Signed)
K+= 2.5, notified Dr. Lissa Hoard, pt will need to see PCP prior to surgery and we will do ISTAT day of surgery, communicated this information to [facility] at Dr. Eusebio Friendly office and she stated she will contact pt and let her know.

## 2018-02-26 NOTE — Therapy (Signed)
[facility] [address] [place], [place], [address] Phone: [phone]   Fax:  [phone]  Physical Therapy Treatment  Patient Details  Name: [name] MRN: [id number] Date of Birth: 09-03-1950 Referring Provider (PT): Edmonia Lynch   Encounter Date: 02/26/2018  PT End of Session - 02/26/18 1220    Visit Number  6    Date for PT Re-Evaluation  04/02/18    Authorization Type  MCR    PT Start Time  1100    PT Stop Time  1135   dry needling, heat   PT Time Calculation (min)  35 min    Activity Tolerance  Patient tolerated treatment well       Past Medical History:  Diagnosis Date  . Anemia    "years ago"  . Arthritis    "left hand; was in my knees" (09/24/2017)  . Breast cancer, right breast ([place]) 2004  . Bruises easily   . Chronic lower back pain   . Ductal carcinoma in situ (DCIS) of left breast 2018  . Genetic testing 04/17/2017   STAT Breast panel with reflex to Multi-Cancer panel (83 genes) @ [facility] - No pathogenic mutations detected  . History of bronchitis    when she was young  . Hypertension    no longer taking medications as of August 2019  . Hypothyroidism    takes Synthroid daily  . Joint pain   . Joint swelling   . Personal history of chemotherapy 04/2003  . Personal history of radiation therapy 2005  . Pneumonia    hx of when she was young  . Right elbow tendonitis   . Sleep apnea    has a machine but does not use (09/24/2017)  . Thyroid cancer ([place]) 2012  . Urinary frequency     Past Surgical History:  Procedure Laterality Date  . ANTERIOR CERVICAL DECOMP/DISCECTOMY FUSION  2008  . APPENDECTOMY  2007  . BREAST BIOPSY Right 02/28/2003   malignant  . BREAST BIOPSY Left 03/2017  . BREAST LUMPECTOMY Right 2004  . BREAST RECONSTRUCTION WITH PLACEMENT OF TISSUE EXPANDER AND FLEX HD (ACELLULAR HYDRATED DERMIS) Bilateral 06/22/2017   Procedure: IMMEDIATE BILATERAL BREAST  RECONSTRUCTION WITH PLACEMENT OF TISSUE EXPANDER AND FLEX HD (ACELLULAR HYDRATED DERMIS);  Surgeon: Wallace Going, DO;  Location: [place];  Service: Plastics;  Laterality: Bilateral;  . CESAREAN SECTION  1981  . COLONOSCOPY  X 2  . JOINT REPLACEMENT    . KNEE ARTHROSCOPY Right 2007  . KNEE ARTHROSCOPY  05/02/2011   Procedure: ARTHROSCOPY KNEE;  Surgeon: Johnn Hai;  Location: [place];  Service: Orthopedics;  Laterality: Right;  . LATISSIMUS FLAP TO BREAST Right 09/23/2017   Procedure: RIGHT BREAST LATISSIMUS DORSI FLAP RECONSTRUCTION WITH TISSUE EXPANDER;  Surgeon: Wallace Going, DO;  Location: [place];  Service: Plastics;  Laterality: Right;  . MASTECTOMY W/ SENTINEL NODE BIOPSY Bilateral 06/22/2017   Procedure: LEFT MASTECTOMY WITH SENTINEL LYMPH NODE BIOPSY AND RIGHT PROPHYLACTIC MASTECTOMY;  Surgeon: Jovita Kussmaul, MD;  Location: [place];  Service: General;  Laterality: Bilateral;  . MENISCUS DEBRIDEMENT  05/02/2011   Procedure: DEBRIDEMENT OF MENISCUS;  Surgeon: Johnn Hai;  Location: [place];  Service: Orthopedics;  Laterality: Right;  . PORTA CATH REMOVAL  2005  . PORTACATH PLACEMENT  2004  . REMOVAL OF TISSUE EXPANDER AND PLACEMENT OF IMPLANT Right 12/12/2017   Procedure: REMOVAL OF TISSUE  EXPANDER, RIGHT;  Surgeon: Irene Limbo, MD;  Location: [place];  Service: Plastics;  Laterality: Right;  . THYROIDECTOMY N/A 09/22/2012   Procedure: RIGHT THYROID LOBECTOMY WITH FROZEN SECTION;  Surgeon: Izora Gala, MD;  Location: [place];  Service: ENT;  Laterality: N/A;  . THYROIDECTOMY Left 10/21/2012   Procedure: COMPLETION OF THYROIDECTOMY;  Surgeon: Izora Gala, MD;  Location: [place];  Service: ENT;  Laterality: Left;  . TOTAL KNEE ARTHROPLASTY  04/14/2012   Procedure: TOTAL KNEE ARTHROPLASTY;  Surgeon: Ninetta Lights, MD;  Location: [place];  Service: Orthopedics;  Laterality: Right;  RIGHT ARTHROPLASTY  KNEE MEDIAL/LATERAL COMPARTMENTS WITH PATELLA RESURFACING  . TOTAL KNEE ARTHROPLASTY Left 03/30/2013   Procedure: TOTAL KNEE ARTHROPLASTY;  Surgeon: Ninetta Lights, MD;  Location: [place];  Service: Orthopedics;  Laterality: Left;    There were no vitals filed for this visit.  Subjective Assessment - 02/26/18 1102    Subjective  DN helped my right elbow.  My left thumb is sore today.  Having surgery next week to do breast reconstruction.  Discussed holding PT while she recovers and that she would need a new order from her doctor to do DN/resume treatment.  Overall 75% better pain wise.      Pertinent History  Breast CA 15 yr ago, thryroid CA, DCIS ([facility]);  ant cerv fusion 2008    Currently in Pain?  Yes    Pain Score  7     Pain Location  Hand    Pain Orientation  Left    Pain Type  Chronic pain                       [facility] Adult PT Treatment/Exercise - 02/26/18 0001      Moist Heat Therapy   Number Minutes Moist Heat  5 Minutes    Moist Heat Location  --   left thumb     Iontophoresis   Type of Iontophoresis  Dexamethasone    Location  R medial epicondyle    Dose  40 ma/min     Time  4 hour patch      Manual Therapy   Soft tissue mobilization  left thumb musculature     Passive ROM  passive ROM left thumb       Trigger Point Dry Needling - 02/26/18 1219    Consent Given?  Yes    Muscles Treated Upper Body  --   left abductor pollicis and brevis            PT Short Term Goals - 02/26/18 1110      PT SHORT TERM GOAL #1   Title  Ind with initial HEP  (Pended)     Status  Achieved  (Pended)       PT SHORT TERM GOAL #2   Title  decreased pain in the R elbow by 50% with ADLS  (Pended)     Status  Achieved  (Pended)       PT SHORT TERM GOAL #3   Title  decreased pain in R hand by 50% with ADLS  (Pended)     Status  Achieved  (Pended)         PT Long Term Goals - 02/05/18 1004      PT LONG TERM GOAL #1   Title  Patient able to perform  ADLs with R elbow and L hand pain of 2/10 or less    Time  8  Period  Weeks    Status  New    Target Date  04/02/18      PT LONG TERM GOAL #2   Title  Patient to demo 5/5 R wrist strength to ease ADLS    Time  8    Period  Weeks    Status  New      PT LONG TERM GOAL #3   Title  Pt independent and compliant in HEP to prevent further injury to her elbow and hand.    Time  8    Period  Weeks    Status  New      PT LONG TERM GOAL #4   Title  FOTO functional outcome improved to 37% indicating improved function with less pain     Time  8    Period  Weeks    Status  New      PT LONG TERM GOAL #5   Title  ---------------------------------------            Plan - 02/26/18 1220    Clinical Impression Statement  The patient reports her right medial elbow and left thumb are about 75-80% better overall.  She reports dry needling and iontophoresis with dexamethasone have been especially helpful.  She is having bilateral breast reconstruction surgery next week.  Will hold PT.  Discussed with patient that she will need a written order to resume PT following particularly before further dry needling would be done particularly since she had complications from a previous surgery on her right breast.      Rehab Potential  Excellent    PT Frequency  2x / week    PT Duration  8 weeks    PT Treatment/Interventions  ADLs/Self Care Home Management;Cryotherapy;Electrical Stimulation;Iontophoresis 4mg /ml Dexamethasone;Moist Heat;Ultrasound;Patient/family education;Therapeutic exercise;Manual techniques;Dry needling;Taping    PT Next Visit Plan  on hold while patient has breast reconstruction surgery.  She will need surgeon's approval before returning to PT;  continue ionto; DN to R wrist flex/extensors and Left thumb muscles; wrist strengthening/ROM    PT Home Exercise Plan  Access Code: [email or web address]: eccentric strength of Rt wrist flexors       Patient will benefit from skilled therapeutic  intervention in order to improve the following deficits and impairments:  Pain, Decreased range of motion, Impaired flexibility, Decreased strength  Visit Diagnosis: Pain in right elbow  Muscle weakness (generalized)  Thumb pain, left     Problem List Patient Active Problem List   Diagnosis Date Noted  . History of breast cancer in female 01/27/2018  . History of reconstruction of both breasts 01/27/2018  . Acquired absence of right breast 09/23/2017  . Breast cancer ([place]) 06/22/2017  . 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 Genetic testing 04/17/2017  . DJD (degenerative joint disease) of knee 03/30/2013  . Unspecified sleep apnea 03/23/2013  . 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 Weakness 11/28/2011  . Hypertension 11/28/2011  . Hypokalemia 11/28/2011  . LUE weakness 11/28/2011  . Hypothyroidism 11/28/2011  . Thyroid cancer ([place]) 04/28/2010   Ruben Im, PT 02/26/18 12:30 PM Phone: [phone] Fax: [phone]  Alvera Singh 02/26/2018, 12:30 PM  [facility] [address] [place], [place], [address] Phone: [phone]   Fax:  [phone]  Name: [name] MRN: [id number] Date of Birth: Jun 28, 1950

## 2018-02-26 NOTE — Progress Notes (Signed)
Patient ID: [name], female    DOB: [birth year], 67 y.o.   MRN: [id number]   Chief Complaint  Patient presents with  . Breast Problem     The patient is a 67 year old BF here for history and physical for breast reconstruction.  She underwent bilateral mastectomies with placement of left expander.  She then had a latissimus with right expander placement.  Unfortunately she had an infection while I was changing practices.  The expander on the right was removed.  The left expander has 740 / 475 cc and the right expander had 490 / 475 cc.  She is doing very well now, there are no signs of infection and no redness.  She originally thought she would not have reconstruction on the right but has changed her mind.  She now would like to have a right breast expander placed.  Her preop bra = 36 B.  Her original diagnosis was left breast cancer, ductal carcinoma in situ, ER/PR positive.  Previous right breast cancer with partial mastectomy and radiation 15 years ago followed by a mastectomy recently.  She is 5 feet 4 inches tall, weight is 218 pounds.    Review of Systems  Constitutional: Negative.  Negative for activity change and appetite change.  HENT: Negative.   Eyes: Negative.   Respiratory: Negative.   Cardiovascular: Negative.   Gastrointestinal: Negative.   Endocrine: Negative.   Genitourinary: Negative.   Musculoskeletal: Negative.   Skin: Negative.   Neurological: Negative.   Hematological: Negative.   Psychiatric/Behavioral: Negative.     Past Medical History:  Diagnosis Date  . Anemia    "years ago"  . Arthritis    "left hand; was in my knees" (09/24/2017)  . Breast cancer, right breast ([place]) 2004  . Bruises easily   . Chronic lower back pain   . Ductal carcinoma in situ (DCIS) of left breast 2018  . Genetic testing 04/17/2017   STAT Breast panel with reflex to Multi-Cancer panel (83 genes) @ [facility] - No pathogenic mutations detected  . History of bronchitis    when she was young  . Hypertension    no longer taking medications as of August 2019  . Hypothyroidism    takes Synthroid daily  . Joint pain   . Joint swelling   . Personal history of chemotherapy 04/2003  . Personal history of radiation therapy 2005  . Pneumonia    hx of when she was young  . Right elbow tendonitis   . Sleep apnea    has a machine but does not use (09/24/2017)  . Thyroid cancer ([place]) 2012  . Urinary frequency     Past Surgical History:  Procedure Laterality Date  . ANTERIOR CERVICAL DECOMP/DISCECTOMY FUSION  2008  . APPENDECTOMY  2007  . BREAST BIOPSY Right 02/28/2003   malignant  . BREAST BIOPSY Left 03/2017  . BREAST LUMPECTOMY Right 2004  . BREAST RECONSTRUCTION WITH PLACEMENT OF TISSUE EXPANDER AND FLEX HD (ACELLULAR HYDRATED DERMIS) Bilateral 06/22/2017   Procedure: IMMEDIATE BILATERAL BREAST RECONSTRUCTION WITH PLACEMENT OF TISSUE EXPANDER AND FLEX HD (ACELLULAR HYDRATED DERMIS);  Surgeon: Wallace Going, DO;  Location: [place];  Service: Plastics;  Laterality: Bilateral;  . CESAREAN SECTION  1981  . COLONOSCOPY  X 2  . JOINT REPLACEMENT    . KNEE ARTHROSCOPY Right 2007  . KNEE ARTHROSCOPY  05/02/2011   Procedure: ARTHROSCOPY KNEE;  Surgeon: Johnn Hai;  Location: [place];  Service: Orthopedics;  Laterality: Right;  . LATISSIMUS FLAP TO BREAST Right 09/23/2017   Procedure: RIGHT BREAST LATISSIMUS DORSI FLAP RECONSTRUCTION WITH TISSUE EXPANDER;  Surgeon: Wallace Going, DO;  Location: [place];  Service: Plastics;  Laterality: Right;  . MASTECTOMY W/ SENTINEL NODE BIOPSY Bilateral 06/22/2017   Procedure: LEFT MASTECTOMY WITH SENTINEL LYMPH NODE BIOPSY AND RIGHT PROPHYLACTIC MASTECTOMY;  Surgeon: Jovita Kussmaul, MD;  Location: [place];  Service: General;  Laterality: Bilateral;  . MENISCUS DEBRIDEMENT  05/02/2011   Procedure: DEBRIDEMENT OF MENISCUS;  Surgeon: Johnn Hai;  Location: [place];  Service: Orthopedics;  Laterality: Right;  . PORTA CATH REMOVAL  2005  . PORTACATH PLACEMENT  2004  . REMOVAL OF TISSUE EXPANDER AND PLACEMENT OF IMPLANT Right 12/12/2017   Procedure: REMOVAL OF TISSUE EXPANDER, RIGHT;  Surgeon: Irene Limbo, MD;  Location: [place];  Service: Plastics;  Laterality: Right;  . THYROIDECTOMY N/A 09/22/2012   Procedure: RIGHT THYROID LOBECTOMY WITH FROZEN SECTION;  Surgeon: Izora Gala, MD;  Location: [place];  Service: ENT;  Laterality: N/A;  . THYROIDECTOMY Left 10/21/2012   Procedure: COMPLETION OF THYROIDECTOMY;  Surgeon: Izora Gala, MD;  Location: [place];  Service: ENT;  Laterality: Left;  . TOTAL KNEE ARTHROPLASTY  04/14/2012   Procedure: TOTAL KNEE ARTHROPLASTY;  Surgeon: Ninetta Lights, MD;  Location: [place];  Service: Orthopedics;  Laterality: Right;  RIGHT ARTHROPLASTY KNEE MEDIAL/LATERAL COMPARTMENTS WITH PATELLA RESURFACING  . TOTAL KNEE ARTHROPLASTY Left 03/30/2013   Procedure: TOTAL KNEE ARTHROPLASTY;  Surgeon: Ninetta Lights, MD;  Location: [place];  Service: Orthopedics;  Laterality: Left;      Current Outpatient Medications:  .  chlorthalidone (HYGROTON) 25 MG tablet, Take 25 mg by mouth daily., Disp: , Rfl:  .  cholecalciferol (VITAMIN D) 1000 units tablet, Take 1,000 Units by mouth daily., Disp: , Rfl:  .  levothyroxine (SYNTHROID, LEVOTHROID) 88 MCG tablet, Take 88 mcg by mouth daily before breakfast., Disp: , Rfl:  .  sulfamethoxazole-trimethoprim (BACTRIM DS,SEPTRA DS) 800-160 MG tablet, Take 1 tablet by mouth 2 (two) times daily., Disp: , Rfl: 0   Objective:   Vitals:   02/26/18 0837  BP: 122/78  Pulse: 83  SpO2: 98%    Physical Exam  Constitutional: She appears well-developed and well-nourished.  HENT:  Head: Normocephalic and atraumatic.  Eyes: Pupils are equal, round, and reactive to light. EOM are normal.  Neck: Normal range of motion.  Cardiovascular: Normal rate.  Pulmonary/Chest: Effort normal. No  stridor. No respiratory distress.  Abdominal: Soft. She exhibits no distension.  Musculoskeletal: Normal range of motion.  Neurological: She is alert.  Skin: Skin is warm.  Psychiatric: She has a normal mood and affect. Her behavior is normal. Thought content normal.   Assessment & Plan:  History of reconstruction of both breasts  History of breast cancer in female  Acquired absence of right breast  Ductal carcinoma in situ (DCIS) of left breast  Plan for removal of the left expander and placement of the implant.  Placement of the right expander.  The risks that can be encountered with and after placement of a breast expander and implant placement were discussed and include the following but not limited to these: bleeding, infection, delayed healing, anesthesia risks, skin sensation changes, injury to structures including nerves, blood vessels, and muscles which may be temporary or permanent, allergies to tape, suture materials and glues, blood products, topical preparations or injected agents, skin contour  irregularities, skin discoloration and swelling, deep vein thrombosis, cardiac and pulmonary complications, pain, which may persist, fluid accumulation, wrinkling of the skin over the expander, changes in nipple or breast sensation, expander leakage or rupture, faulty position of the expander, persistent pain, formation of tight scar tissue around the expander (capsular contracture), possible need for revisional surgery or staged procedures.  [place], DO

## 2018-02-26 NOTE — H&P (View-Only) (Signed)
Patient ID: [name], female    DOB: 1951-03-11, 67 y.o.   MRN: [id number]   Chief Complaint  Patient presents with  . Breast Problem     The patient is a 67 year old BF here for history and physical for breast reconstruction.  She underwent bilateral mastectomies with placement of left expander.  She then had a latissimus with right expander placement.  Unfortunately she had an infection while I was changing practices.  The expander on the right was removed.  The left expander has 740 / 475 cc and the right expander had 490 / 475 cc.  She is doing very well now, there are no signs of infection and no redness.  She originally thought she would not have reconstruction on the right but has changed her mind.  She now would like to have a right breast expander placed.  Her preop bra = 36 B.  Her original diagnosis was left breast cancer, ductal carcinoma in situ, ER/PR positive.  Previous right breast cancer with partial mastectomy and radiation 15 years ago followed by a mastectomy recently.  She is 5 feet 4 inches tall, weight is 218 pounds.    Review of Systems  Constitutional: Negative.  Negative for activity change and appetite change.  HENT: Negative.   Eyes: Negative.   Respiratory: Negative.   Cardiovascular: Negative.   Gastrointestinal: Negative.   Endocrine: Negative.   Genitourinary: Negative.   Musculoskeletal: Negative.   Skin: Negative.   Neurological: Negative.   Hematological: Negative.   Psychiatric/Behavioral: Negative.     Past Medical History:  Diagnosis Date  . Anemia    "years ago"  . Arthritis    "left hand; was in my knees" (09/24/2017)  . Breast cancer, right breast ([place]) 2004  . Bruises easily   . Chronic lower back pain   . Ductal carcinoma in situ (DCIS) of left breast 2018  . Genetic testing 04/17/2017   STAT Breast panel with reflex to Multi-Cancer panel (83 genes) @ [facility] - No pathogenic mutations detected  . History of bronchitis    when she was young  . Hypertension    no longer taking medications as of August 2019  . Hypothyroidism    takes Synthroid daily  . Joint pain   . Joint swelling   . Personal history of chemotherapy 04/2003  . Personal history of radiation therapy 2005  . Pneumonia    hx of when she was young  . Right elbow tendonitis   . Sleep apnea    has a machine but does not use (09/24/2017)  . Thyroid cancer ([place]) 2012  . Urinary frequency     Past Surgical History:  Procedure Laterality Date  . ANTERIOR CERVICAL DECOMP/DISCECTOMY FUSION  2008  . APPENDECTOMY  2007  . BREAST BIOPSY Right 02/28/2003   malignant  . BREAST BIOPSY Left 03/2017  . BREAST LUMPECTOMY Right 2004  . BREAST RECONSTRUCTION WITH PLACEMENT OF TISSUE EXPANDER AND FLEX HD (ACELLULAR HYDRATED DERMIS) Bilateral 06/22/2017   Procedure: IMMEDIATE BILATERAL BREAST RECONSTRUCTION WITH PLACEMENT OF TISSUE EXPANDER AND FLEX HD (ACELLULAR HYDRATED DERMIS);  Surgeon: Wallace Going, DO;  Location: [facility];  Service: Plastics;  Laterality: Bilateral;  . CESAREAN SECTION  1981  . COLONOSCOPY  X 2  . JOINT REPLACEMENT    . KNEE ARTHROSCOPY Right 2007  . KNEE ARTHROSCOPY  05/02/2011   Procedure: ARTHROSCOPY KNEE;  Surgeon: Johnn Hai;  Location: [place];  Service: Orthopedics;  Laterality: Right;  . LATISSIMUS FLAP TO BREAST Right 09/23/2017   Procedure: RIGHT BREAST LATISSIMUS DORSI FLAP RECONSTRUCTION WITH TISSUE EXPANDER;  Surgeon: Wallace Going, DO;  Location: [place];  Service: Plastics;  Laterality: Right;  . MASTECTOMY W/ SENTINEL NODE BIOPSY Bilateral 06/22/2017   Procedure: LEFT MASTECTOMY WITH SENTINEL LYMPH NODE BIOPSY AND RIGHT PROPHYLACTIC MASTECTOMY;  Surgeon: Jovita Kussmaul, MD;  Location: [place];  Service: General;  Laterality: Bilateral;  . MENISCUS DEBRIDEMENT  05/02/2011   Procedure: DEBRIDEMENT OF MENISCUS;  Surgeon: Johnn Hai;  Location: [place];  Service: Orthopedics;  Laterality: Right;  . PORTA CATH REMOVAL  2005  . PORTACATH PLACEMENT  2004  . REMOVAL OF TISSUE EXPANDER AND PLACEMENT OF IMPLANT Right 12/12/2017   Procedure: REMOVAL OF TISSUE EXPANDER, RIGHT;  Surgeon: Irene Limbo, MD;  Location: [place];  Service: Plastics;  Laterality: Right;  . THYROIDECTOMY N/A 09/22/2012   Procedure: RIGHT THYROID LOBECTOMY WITH FROZEN SECTION;  Surgeon: Izora Gala, MD;  Location: [place];  Service: ENT;  Laterality: N/A;  . THYROIDECTOMY Left 10/21/2012   Procedure: COMPLETION OF THYROIDECTOMY;  Surgeon: Izora Gala, MD;  Location: [place];  Service: ENT;  Laterality: Left;  . TOTAL KNEE ARTHROPLASTY  04/14/2012   Procedure: TOTAL KNEE ARTHROPLASTY;  Surgeon: Ninetta Lights, MD;  Location: [place];  Service: Orthopedics;  Laterality: Right;  RIGHT ARTHROPLASTY KNEE MEDIAL/LATERAL COMPARTMENTS WITH PATELLA RESURFACING  . TOTAL KNEE ARTHROPLASTY Left 03/30/2013   Procedure: TOTAL KNEE ARTHROPLASTY;  Surgeon: Ninetta Lights, MD;  Location: [place];  Service: Orthopedics;  Laterality: Left;      Current Outpatient Medications:  .  chlorthalidone (HYGROTON) 25 MG tablet, Take 25 mg by mouth daily., Disp: , Rfl:  .  cholecalciferol (VITAMIN D) 1000 units tablet, Take 1,000 Units by mouth daily., Disp: , Rfl:  .  levothyroxine (SYNTHROID, LEVOTHROID) 88 MCG tablet, Take 88 mcg by mouth daily before breakfast., Disp: , Rfl:  .  sulfamethoxazole-trimethoprim (BACTRIM DS,SEPTRA DS) 800-160 MG tablet, Take 1 tablet by mouth 2 (two) times daily., Disp: , Rfl: 0   Objective:   Vitals:   02/26/18 0837  BP: 122/78  Pulse: 83  SpO2: 98%    Physical Exam  Constitutional: She appears well-developed and well-nourished.  HENT:  Head: Normocephalic and atraumatic.  Eyes: Pupils are equal, round, and reactive to light. EOM are normal.  Neck: Normal range of motion.  Cardiovascular: Normal rate.  Pulmonary/Chest: Effort normal. No  stridor. No respiratory distress.  Abdominal: Soft. She exhibits no distension.  Musculoskeletal: Normal range of motion.  Neurological: She is alert.  Skin: Skin is warm.  Psychiatric: She has a normal mood and affect. Her behavior is normal. Thought content normal.   Assessment & Plan:  History of reconstruction of both breasts  History of breast cancer in female  Acquired absence of right breast  Ductal carcinoma in situ (DCIS) of left breast  Plan for removal of the left expander and placement of the implant.  Placement of the right expander.  The risks that can be encountered with and after placement of a breast expander and implant placement were discussed and include the following but not limited to these: bleeding, infection, delayed healing, anesthesia risks, skin sensation changes, injury to structures including nerves, blood vessels, and muscles which may be temporary or permanent, allergies to tape, suture materials and glues, blood products, topical preparations or injected agents, skin contour  irregularities, skin discoloration and swelling, deep vein thrombosis, cardiac and pulmonary complications, pain, which may persist, fluid accumulation, wrinkling of the skin over the expander, changes in nipple or breast sensation, expander leakage or rupture, faulty position of the expander, persistent pain, formation of tight scar tissue around the expander (capsular contracture), possible need for revisional surgery or staged procedures.  [place], DO

## 2018-03-01 DIAGNOSIS — I10 Essential (primary) hypertension: Secondary | ICD-10-CM | POA: Diagnosis not present

## 2018-03-01 DIAGNOSIS — R7309 Other abnormal glucose: Secondary | ICD-10-CM | POA: Diagnosis not present

## 2018-03-03 ENCOUNTER — Encounter (HOSPITAL_BASED_OUTPATIENT_CLINIC_OR_DEPARTMENT_OTHER)
Admission: RE | Disposition: A | Discharge status: Home / Self Care | Payer: Self-pay | Source: Ambulatory Visit | Attending: Plastic Surgery

## 2018-03-03 ENCOUNTER — Other Ambulatory Visit: Payer: Self-pay

## 2018-03-03 ENCOUNTER — Encounter: Payer: Medicare Other | Admitting: Physical Therapy

## 2018-03-03 ENCOUNTER — Ambulatory Visit (HOSPITAL_BASED_OUTPATIENT_CLINIC_OR_DEPARTMENT_OTHER): Payer: Medicare Other | Admitting: Anesthesiology

## 2018-03-03 ENCOUNTER — Encounter (HOSPITAL_BASED_OUTPATIENT_CLINIC_OR_DEPARTMENT_OTHER): Payer: Self-pay | Admitting: Anesthesiology

## 2018-03-03 ENCOUNTER — Ambulatory Visit (HOSPITAL_BASED_OUTPATIENT_CLINIC_OR_DEPARTMENT_OTHER)
Admission: RE | Admit: 2018-03-03 | Discharge: 2018-03-03 | Disposition: A | Discharge status: Home / Self Care | Payer: Medicare Other | Source: Ambulatory Visit | Attending: Plastic Surgery | Admitting: Plastic Surgery

## 2018-03-03 DIAGNOSIS — I10 Essential (primary) hypertension: Secondary | ICD-10-CM | POA: Diagnosis not present

## 2018-03-03 DIAGNOSIS — Z421 Encounter for breast reconstruction following mastectomy: Secondary | ICD-10-CM | POA: Diagnosis not present

## 2018-03-03 DIAGNOSIS — Z79899 Other long term (current) drug therapy: Secondary | ICD-10-CM | POA: Insufficient documentation

## 2018-03-03 DIAGNOSIS — Z9013 Acquired absence of bilateral breasts and nipples: Secondary | ICD-10-CM | POA: Insufficient documentation

## 2018-03-03 DIAGNOSIS — Z96653 Presence of artificial knee joint, bilateral: Secondary | ICD-10-CM | POA: Diagnosis not present

## 2018-03-03 DIAGNOSIS — Z85850 Personal history of malignant neoplasm of thyroid: Secondary | ICD-10-CM | POA: Diagnosis not present

## 2018-03-03 DIAGNOSIS — Z853 Personal history of malignant neoplasm of breast: Secondary | ICD-10-CM | POA: Insufficient documentation

## 2018-03-03 DIAGNOSIS — L905 Scar conditions and fibrosis of skin: Secondary | ICD-10-CM | POA: Insufficient documentation

## 2018-03-03 DIAGNOSIS — G4730 Sleep apnea, unspecified: Secondary | ICD-10-CM | POA: Diagnosis not present

## 2018-03-03 DIAGNOSIS — E890 Postprocedural hypothyroidism: Secondary | ICD-10-CM | POA: Insufficient documentation

## 2018-03-03 DIAGNOSIS — Z9011 Acquired absence of right breast and nipple: Secondary | ICD-10-CM | POA: Diagnosis not present

## 2018-03-03 HISTORY — PX: TISSUE EXPANDER PLACEMENT: SHX2530

## 2018-03-03 LAB — POCT I-STAT, CHEM 8
BUN: 9 mg/dL (ref 8–23)
CALCIUM ION: 1.13 mmol/L — AB (ref 1.15–1.40)
Chloride: 105 mmol/L (ref 98–111)
Creatinine, Ser: 0.9 mg/dL (ref 0.44–1.00)
Glucose, Bld: 105 mg/dL — ABNORMAL HIGH (ref 70–99)
HEMATOCRIT: 38 % (ref 36.0–46.0)
Hemoglobin: 12.9 g/dL (ref 12.0–15.0)
Potassium: 3.5 mmol/L (ref 3.5–5.1)
SODIUM: 143 mmol/L (ref 135–145)
TCO2: 27 mmol/L (ref 22–32)

## 2018-03-03 SURGERY — INSERTION, TISSUE EXPANDER
Anesthesia: General | Site: Breast | Laterality: Right

## 2018-03-03 MED ORDER — SODIUM CHLORIDE 0.9 % IV SOLN
INTRAVENOUS | Status: DC | PRN
  Administered 2018-03-03: 50 ug/min via INTRAVENOUS

## 2018-03-03 MED ORDER — LIDOCAINE-EPINEPHRINE 1 %-1:100000 IJ SOLN
INTRAMUSCULAR | Status: AC
  Filled 2018-03-03: qty 1

## 2018-03-03 MED ORDER — FENTANYL CITRATE (PF) 100 MCG/2ML IJ SOLN
INTRAMUSCULAR | Status: AC
  Filled 2018-03-03: qty 2

## 2018-03-03 MED ORDER — SCOPOLAMINE 1 MG/3DAYS TD PT72
1.0000 | MEDICATED_PATCH | Freq: Once | TRANSDERMAL | Status: AC | PRN
  Administered 2018-03-03: 1 via TRANSDERMAL

## 2018-03-03 MED ORDER — SODIUM CHLORIDE 0.9 % IV SOLN: 250.0000 mL | INTRAVENOUS | Status: DC | PRN

## 2018-03-03 MED ORDER — OXYCODONE HCL 5 MG PO TABS: 5.0000 mg | ORAL_TABLET | ORAL | Status: DC | PRN

## 2018-03-03 MED ORDER — PROPOFOL 10 MG/ML IV BOLUS
INTRAVENOUS | Status: DC | PRN
  Administered 2018-03-03: 150 mg via INTRAVENOUS

## 2018-03-03 MED ORDER — HYDROMORPHONE HCL 1 MG/ML IJ SOLN: 0.2500 mg | INTRAMUSCULAR | Status: DC | PRN

## 2018-03-03 MED ORDER — DEXAMETHASONE SODIUM PHOSPHATE 10 MG/ML IJ SOLN
INTRAMUSCULAR | Status: AC
  Filled 2018-03-03: qty 1

## 2018-03-03 MED ORDER — SCOPOLAMINE 1 MG/3DAYS TD PT72
MEDICATED_PATCH | TRANSDERMAL | Status: AC
  Filled 2018-03-03: qty 1

## 2018-03-03 MED ORDER — ROCURONIUM BROMIDE 100 MG/10ML IV SOLN
INTRAVENOUS | Status: DC | PRN
  Administered 2018-03-03: 50 mg via INTRAVENOUS

## 2018-03-03 MED ORDER — PHENYLEPHRINE HCL 10 MG/ML IJ SOLN
INTRAMUSCULAR | Status: AC
  Filled 2018-03-03: qty 1

## 2018-03-03 MED ORDER — OXYCODONE HCL 5 MG PO TABS: 5.0000 mg | ORAL_TABLET | Freq: Once | ORAL | Status: DC | PRN

## 2018-03-03 MED ORDER — ONDANSETRON HCL 4 MG/2ML IJ SOLN
INTRAMUSCULAR | Status: AC
  Filled 2018-03-03: qty 2

## 2018-03-03 MED ORDER — BUPIVACAINE-EPINEPHRINE (PF) 0.25% -1:200000 IJ SOLN
INTRAMUSCULAR | Status: AC
  Filled 2018-03-03: qty 30

## 2018-03-03 MED ORDER — LACTATED RINGERS IV SOLN: INTRAVENOUS | Status: DC

## 2018-03-03 MED ORDER — MIDAZOLAM HCL 2 MG/2ML IJ SOLN
INTRAMUSCULAR | Status: AC
  Filled 2018-03-03: qty 2

## 2018-03-03 MED ORDER — OXYCODONE HCL 5 MG/5ML PO SOLN: 5.0000 mg | Freq: Once | ORAL | Status: DC | PRN

## 2018-03-03 MED ORDER — PROPOFOL 10 MG/ML IV BOLUS
INTRAVENOUS | Status: AC
  Filled 2018-03-03: qty 20

## 2018-03-03 MED ORDER — DEXAMETHASONE SODIUM PHOSPHATE 4 MG/ML IJ SOLN
INTRAMUSCULAR | Status: DC | PRN
  Administered 2018-03-03: 10 mg via INTRAVENOUS

## 2018-03-03 MED ORDER — PROMETHAZINE HCL 25 MG/ML IJ SOLN: 6.2500 mg | INTRAMUSCULAR | Status: DC | PRN

## 2018-03-03 MED ORDER — ACETAMINOPHEN 650 MG RE SUPP: 650.0000 mg | RECTAL | Status: DC | PRN

## 2018-03-03 MED ORDER — LIDOCAINE 2% (20 MG/ML) 5 ML SYRINGE
INTRAMUSCULAR | Status: AC
  Filled 2018-03-03: qty 5

## 2018-03-03 MED ORDER — MEPERIDINE HCL 25 MG/ML IJ SOLN: 6.2500 mg | INTRAMUSCULAR | Status: DC | PRN

## 2018-03-03 MED ORDER — LIDOCAINE HCL (CARDIAC) PF 100 MG/5ML IV SOSY
PREFILLED_SYRINGE | INTRAVENOUS | Status: DC | PRN
  Administered 2018-03-03: 50 mg via INTRAVENOUS

## 2018-03-03 MED ORDER — BUPIVACAINE-EPINEPHRINE 0.25% -1:200000 IJ SOLN
INTRAMUSCULAR | Status: DC | PRN
  Administered 2018-03-03: 5 mL

## 2018-03-03 MED ORDER — SODIUM CHLORIDE 0.9% FLUSH: 3.0000 mL | INTRAVENOUS | Status: DC | PRN

## 2018-03-03 MED ORDER — EPHEDRINE SULFATE-NACL 50-0.9 MG/10ML-% IV SOSY
PREFILLED_SYRINGE | INTRAVENOUS | Status: DC | PRN
  Administered 2018-03-03: 10 mg via INTRAVENOUS

## 2018-03-03 MED ORDER — LACTATED RINGERS IV SOLN
INTRAVENOUS | Status: DC
  Administered 2018-03-03 (×2): via INTRAVENOUS

## 2018-03-03 MED ORDER — SODIUM CHLORIDE 0.9 % IV SOLN
INTRAVENOUS | Status: DC | PRN
  Administered 2018-03-03: 500 mL

## 2018-03-03 MED ORDER — CEFAZOLIN SODIUM-DEXTROSE 2-4 GM/100ML-% IV SOLN
2.0000 g | INTRAVENOUS | Status: AC
  Administered 2018-03-03: 2 g via INTRAVENOUS

## 2018-03-03 MED ORDER — FENTANYL CITRATE (PF) 100 MCG/2ML IJ SOLN
50.0000 ug | INTRAMUSCULAR | Status: AC | PRN
  Administered 2018-03-03: 100 ug via INTRAVENOUS
  Administered 2018-03-03 (×3): 25 ug via INTRAVENOUS

## 2018-03-03 MED ORDER — SUGAMMADEX SODIUM 200 MG/2ML IV SOLN
INTRAVENOUS | Status: DC | PRN
  Administered 2018-03-03: 200 mg via INTRAVENOUS

## 2018-03-03 MED ORDER — CEFAZOLIN SODIUM-DEXTROSE 2-4 GM/100ML-% IV SOLN
INTRAVENOUS | Status: AC
  Filled 2018-03-03: qty 100

## 2018-03-03 MED ORDER — SODIUM CHLORIDE 0.9% FLUSH: 3.0000 mL | Freq: Two times a day (BID) | INTRAVENOUS | Status: DC

## 2018-03-03 MED ORDER — ACETAMINOPHEN 325 MG PO TABS: 650.0000 mg | ORAL_TABLET | ORAL | Status: DC | PRN

## 2018-03-03 MED ORDER — ONDANSETRON HCL 4 MG/2ML IJ SOLN
INTRAMUSCULAR | Status: DC | PRN
  Administered 2018-03-03: 4 mg via INTRAVENOUS

## 2018-03-03 MED ORDER — ROCURONIUM BROMIDE 50 MG/5ML IV SOSY
PREFILLED_SYRINGE | INTRAVENOUS | Status: AC
  Filled 2018-03-03: qty 5

## 2018-03-03 MED ORDER — MIDAZOLAM HCL 2 MG/2ML IJ SOLN
1.0000 mg | INTRAMUSCULAR | Status: DC | PRN
  Administered 2018-03-03: 2 mg via INTRAVENOUS

## 2018-03-03 SURGICAL SUPPLY — 77 items
ADH SKN CLS APL DERMABOND .7 (GAUZE/BANDAGES/DRESSINGS)
BAG DECANTER FOR FLEXI CONT (MISCELLANEOUS) ×3 IMPLANT
BINDER BREAST LRG (GAUZE/BANDAGES/DRESSINGS) IMPLANT
BINDER BREAST MEDIUM (GAUZE/BANDAGES/DRESSINGS) IMPLANT
BINDER BREAST XLRG (GAUZE/BANDAGES/DRESSINGS) IMPLANT
BINDER BREAST XXLRG (GAUZE/BANDAGES/DRESSINGS) IMPLANT
BIOPATCH RED 1 DISK 7.0 (GAUZE/BANDAGES/DRESSINGS) IMPLANT
BLADE HEX COATED 2.75 (ELECTRODE) IMPLANT
BLADE SURG 10 STRL SS (BLADE) IMPLANT
BLADE SURG 15 STRL LF DISP TIS (BLADE) ×2 IMPLANT
BLADE SURG 15 STRL SS (BLADE) ×3
BNDG GAUZE ELAST 4 BULKY (GAUZE/BANDAGES/DRESSINGS) ×2 IMPLANT
CANISTER SUCT 1200ML W/VALVE (MISCELLANEOUS) ×3 IMPLANT
CHLORAPREP W/TINT 26ML (MISCELLANEOUS) ×3 IMPLANT
CORD BIPOLAR FORCEPS 12FT (ELECTRODE) IMPLANT
COVER BACK TABLE 60X90IN (DRAPES) ×3 IMPLANT
COVER MAYO STAND STRL (DRAPES) ×3 IMPLANT
COVER WAND RF STERILE (DRAPES) IMPLANT
DECANTER SPIKE VIAL GLASS SM (MISCELLANEOUS) IMPLANT
DERMABOND ADVANCED (GAUZE/BANDAGES/DRESSINGS)
DERMABOND ADVANCED .7 DNX12 (GAUZE/BANDAGES/DRESSINGS) IMPLANT
DRAIN CHANNEL 19F RND (DRAIN) IMPLANT
DRAPE LAPAROSCOPIC ABDOMINAL (DRAPES) ×3 IMPLANT
DRSG PAD ABDOMINAL 8X10 ST (GAUZE/BANDAGES/DRESSINGS) ×6 IMPLANT
DRSG TEGADERM 2-3/8X2-3/4 SM (GAUZE/BANDAGES/DRESSINGS) IMPLANT
ELECT BLADE 4.0 EZ CLEAN MEGAD (MISCELLANEOUS) ×3
ELECT COATED BLADE 2.86 ST (ELECTRODE) ×3 IMPLANT
ELECT REM PT RETURN 9FT ADLT (ELECTROSURGICAL) ×3
ELECTRODE BLDE 4.0 EZ CLN MEGD (MISCELLANEOUS) ×2 IMPLANT
ELECTRODE REM PT RTRN 9FT ADLT (ELECTROSURGICAL) ×2 IMPLANT
EVACUATOR SILICONE 100CC (DRAIN) IMPLANT
GAUZE SPONGE 4X4 12PLY STRL LF (GAUZE/BANDAGES/DRESSINGS) IMPLANT
GLOVE BIO SURGEON STRL SZ 6.5 (GLOVE) ×10 IMPLANT
GLOVE BIOGEL PI IND STRL 8 (GLOVE) ×1 IMPLANT
GLOVE BIOGEL PI INDICATOR 8 (GLOVE) ×1
GLOVE SURG SS PI 8.0 STRL IVOR (GLOVE) ×2 IMPLANT
GOWN STRL REUS W/ TWL LRG LVL3 (GOWN DISPOSABLE) ×4 IMPLANT
GOWN STRL REUS W/TWL LRG LVL3 (GOWN DISPOSABLE) ×6
IMPL EXPANDER BREAST 455CC (Breast) ×1 IMPLANT
IMPLANT BREAST 455CC (Breast) ×1 IMPLANT
IMPLANT EXPANDER BREAST 455CC (Breast) ×2 IMPLANT
IV NS 1000ML (IV SOLUTION)
IV NS 1000ML BAXH (IV SOLUTION) IMPLANT
IV NS 500ML (IV SOLUTION)
IV NS 500ML BAXH (IV SOLUTION) IMPLANT
KIT FILL SYSTEM UNIVERSAL (SET/KITS/TRAYS/PACK) IMPLANT
MANIFOLD NEPTUNE II (INSTRUMENTS) IMPLANT
NDL HYPO 25X1 1.5 SAFETY (NEEDLE) ×1 IMPLANT
NDL SAFETY ECLIPSE 18X1.5 (NEEDLE) ×2 IMPLANT
NEEDLE HYPO 18GX1.5 SHARP (NEEDLE) ×3
NEEDLE HYPO 25X1 1.5 SAFETY (NEEDLE) ×3 IMPLANT
PACK BASIN DAY SURGERY FS (CUSTOM PROCEDURE TRAY) ×3 IMPLANT
PENCIL BUTTON HOLSTER BLD 10FT (ELECTRODE) ×3 IMPLANT
PIN SAFETY STERILE (MISCELLANEOUS) ×3 IMPLANT
SET ASEPTIC TRANSFER (MISCELLANEOUS) ×2 IMPLANT
SLEEVE SCD COMPRESS KNEE MED (MISCELLANEOUS) ×3 IMPLANT
SPONGE LAP 18X18 RF (DISPOSABLE) ×9 IMPLANT
STRIP SUTURE WOUND CLOSURE 1/2 (SUTURE) IMPLANT
SUT MNCRL AB 3-0 PS2 18 (SUTURE) ×5 IMPLANT
SUT MNCRL AB 4-0 PS2 18 (SUTURE) ×5 IMPLANT
SUT MON AB 3-0 SH 27 (SUTURE) ×18
SUT MON AB 3-0 SH27 (SUTURE) ×7 IMPLANT
SUT MON AB 5-0 PS2 18 (SUTURE) ×5 IMPLANT
SUT PDS 3-0 CT2 (SUTURE)
SUT PDS AB 2-0 CT2 27 (SUTURE) IMPLANT
SUT PDS II 3-0 CT2 27 ABS (SUTURE) IMPLANT
SUT SILK 3 0 PS 1 (SUTURE) ×2 IMPLANT
SUT VIC AB 3-0 SH 27 (SUTURE) ×6
SUT VIC AB 3-0 SH 27X BRD (SUTURE) ×4 IMPLANT
SUT VICRYL 4-0 PS2 18IN ABS (SUTURE) ×3 IMPLANT
SYR 50ML LL SCALE MARK (SYRINGE) IMPLANT
SYR BULB IRRIGATION 50ML (SYRINGE) ×3 IMPLANT
SYR CONTROL 10ML LL (SYRINGE) ×3 IMPLANT
TOWEL GREEN STERILE FF (TOWEL DISPOSABLE) ×6 IMPLANT
TUBE CONNECTING 20X1/4 (TUBING) ×3 IMPLANT
UNDERPAD 30X30 (UNDERPADS AND DIAPERS) ×6 IMPLANT
YANKAUER SUCT BULB TIP NO VENT (SUCTIONS) ×3 IMPLANT

## 2018-03-03 NOTE — Discharge Instructions (Signed)
°Post Anesthesia Home Care Instructions ° °Activity: °Get plenty of rest for the remainder of the day. A responsible individual must stay with you for 24 hours following the procedure.  °For the next 24 hours, DO NOT: °-Drive a car °-Operate machinery °-Drink alcoholic beverages °-Take any medication unless instructed by your physician °-Make any legal decisions or sign important papers. ° °Meals: °Start with liquid foods such as gelatin or soup. Progress to regular foods as tolerated. Avoid greasy, spicy, heavy foods. If nausea and/or vomiting occur, drink only clear liquids until the nausea and/or vomiting subsides. Call your physician if vomiting continues. ° °Special Instructions/Symptoms: °Your throat may feel dry or sore from the anesthesia or the breathing tube placed in your throat during surgery. If this causes discomfort, gargle with warm salt water. The discomfort should disappear within 24 hours. ° °If you had a scopolamine patch placed behind your ear for the management of post- operative nausea and/or vomiting: ° °1. The medication in the patch is effective for 72 hours, after which it should be removed.  Wrap patch in a tissue and discard in the trash. Wash hands thoroughly with soap and water. °2. You may remove the patch earlier than 72 hours if you experience unpleasant side effects which may include dry mouth, dizziness or visual disturbances. °3. Avoid touching the patch. Wash your hands with soap and water after contact with the patch. °   °INSTRUCTIONS FOR AFTER BREAST SURGERY ° ° °You are getting ready to undergo breast surgery.  You will likely have some questions about what to expect following your operation.  The following information will help you and your family understand what to expect when you are discharged from the hospital.  Following these guidelines will help ensure a smooth recovery and reduce risks of complications.   °Postoperative instructions include information on: diet,  wound care, medications and physical activity. ° °AFTER SURGERY °Expect to go home after the procedure.  In some cases, you may need to spend one night in the hospital for observation. ° °DIET °Breast surgery does not require a specific diet.  However, I have to mention that the healthier you eat the better your body can start healing. It is important to increasing your protein intake.  This means limiting the foods with sugar and carbohydrates.  Focus on vegetables and some meat.  If you have any liposuction during your procedure be sure to drink water.  If your urine is bright yellow, then it is concentrated, and you need to drink more water.  As a general rule after surgery, you should have 8 ounces of water every hour while awake.  If you find you are persistently nauseated or unable to take in liquids let us know.  NO TOBACCO USE or EXPOSURE.  This will slow your healing process and increase the risk of a wound. ° °WOUND CARE °You can shower the day after surgery if you don't have a drain.  Use fragrance free soap.  Dial, Dove and Ivory are usually mild on the skin. If you have a drain clean with baby wipes until the drain is removed.  If you have steri-strips / tape directly attached to your skin leave them in place. It is OK to get these wet.  No baths, pools or hot tubs for two weeks. °We close your incision to leave the smallest and best-looking scar. No ointment or creams on your incisions until given the go ahead.  Especially not Neosporin (Too many skin   reactions with this one).  A few weeks after surgery you can use Mederma and start massaging the scar. °We ask you to wear your binder or sports bra for the first 6 weeks around the clock, including while sleeping. This provides added comfort and helps reduce the fluid accumulation at the surgery site. ° °ACTIVITY °No heavy lifting until cleared by the doctor.  This usually means no more than a half-gallon of milk.  It is OK to walk and climb stairs. In  fact, moving your legs is very important to decrease your risk of a blood clot.  It will also help keep you from getting deconditioned.  Every 1 to 2 hours get up and walk for 5 minutes. This will help with a quicker recovery back to normal.  Let pain be your guide so you don't do too much.  NO, you cannot do the spring cleaning and don't plan on taking care of anyone else.  This is your time for TLC.  °You will be more comfortable if you sleep and rest with your head elevated either with a few pillows under you or in a recliner.  No stomach sleeping for a few months. ° °WORK °Everyone returns to work at different times. As a rough guide, most people take at least 1 - 2 weeks off prior to returning to work. If you need documentation for your job, bring the forms to your postoperative follow up visit. ° °DRIVING °Arrange for someone to bring you home from the hospital.  You may be able to drive a few days after surgery but not while taking any narcotics or valium. ° °BOWEL MOVEMENTS °Constipation can occur after anesthesia and while taking pain medication.  It is important to stay ahead for your comfort.  We recommend taking Milk of Magnesia (2 tablespoons; twice a day) while taking the pain pills. ° °SEROMA °This is fluid your body tried to put in the surgical site.  This is normal but if it creates tight skinny skin let us know.  It usually decreases in a few weeks. ° °WHEN TO CALL °Call your surgeon's office if any of the following occur: °• Fever 101 degrees F or greater °• Excessive bleeding or fluid from the incision site. °• Pain that increases over time without aid from the medications °• Redness, warmth, or pus draining from incision sites °• Persistent nausea or inability to take in liquids °• Severe misshapen area that underwent the operation. ° °Here are some resources: ° °1. Plastic surgery website:  https://www.plasticsurgery.org/for-medical-professionals/education-and-resources/publications/breast-reconstruction-magazine °2. Breast Reconstruction Awareness Campaign:  http://www.breastreconusa.org/ °3. Plastic surgery Implant information:  https://www.plasticsurgery.org/patient-safety/breast-implant-safety ° °

## 2018-03-03 NOTE — Anesthesia Preprocedure Evaluation (Addendum)
Anesthesia Evaluation  Patient identified by MRN, date of birth, ID band Patient awake    Reviewed: Allergy & Precautions, NPO status , Patient's Chart, lab work & pertinent test results  Airway Mallampati: II  TM Distance: >3 FB Neck ROM: Full    Dental  (+) Dental Advisory Given, Teeth Intact,    Pulmonary sleep apnea ,    Pulmonary exam normal        Cardiovascular hypertension,  Rhythm:Regular Rate:Normal     Neuro/Psych negative neurological ROS     GI/Hepatic negative GI ROS, Neg liver ROS,   Endo/Other  Hypothyroidism   Renal/GU negative Renal ROS     Musculoskeletal  (+) Arthritis , Osteoarthritis,    Abdominal (+) + obese,   Peds  Hematology   Anesthesia Other Findings   Reproductive/Obstetrics                            Lab Results  Component Value Date   WBC 7.1 12/12/2017   HGB 9.3 (L) 12/12/2017   HCT 30.6 (L) 12/12/2017   MCV 88.7 12/12/2017   PLT 330 12/12/2017   Lab Results  Component Value Date   CREATININE 0.95 02/26/2018   BUN 12 02/26/2018   NA 139 02/26/2018   K 2.5 (LL) 02/26/2018   CL 101 02/26/2018   CO2 30 02/26/2018   Repeat K+ 3.5  Anesthesia Physical Anesthesia Plan  ASA: II  Anesthesia Plan: General   Post-op Pain Management:    Induction: Intravenous  PONV Risk Score and Plan: 4 or greater and Ondansetron, Dexamethasone, Midazolam and Scopolamine patch - Pre-op  Airway Management Planned: Oral ETT  Additional Equipment: None  Intra-op Plan:   Post-operative Plan: Extubation in OR  Informed Consent: I have reviewed the patients History and Physical, chart, labs and discussed the procedure including the risks, benefits and alternatives for the proposed anesthesia with the patient or authorized representative who has indicated his/her understanding and acceptance.   Dental advisory given  Plan Discussed with: CRNA  Anesthesia Plan  Comments:        Anesthesia Quick Evaluation

## 2018-03-03 NOTE — Transfer of Care (Signed)
Immediate Anesthesia Transfer of Care Note  Patient: [name]  Procedure(s) Performed: TISSUE EXPANDER PLACEMENT (Right Breast)  Patient Location: PACU  Anesthesia Type:General  Level of Consciousness: awake, sedated and patient cooperative  Airway & Oxygen Therapy: Patient Spontanous Breathing and Patient connected to face mask oxygen  Post-op Assessment: Report given to RN and Post -op Vital signs reviewed and stable  Post vital signs: Reviewed and stable  Last Vitals:  Vitals Value Taken Time  BP 177/95 03/03/2018 10:45 AM  Temp    Pulse 93 03/03/2018 10:45 AM  Resp 0 03/03/2018 10:45 AM  SpO2 99 % 03/03/2018 10:45 AM  Vitals shown include unvalidated device data.  Last Pain:  Vitals:   03/03/18 0718  TempSrc: Oral  PainSc: 0-No pain         Complications: No apparent anesthesia complications

## 2018-03-03 NOTE — Anesthesia Procedure Notes (Signed)
Procedure Name: Intubation Date/Time: 03/03/2018 8:26 AM Performed by: Lyndee Leo, CRNA Pre-anesthesia Checklist: Patient identified, Emergency Drugs available, Suction available and Patient being monitored Patient Re-evaluated:Patient Re-evaluated prior to induction Oxygen Delivery Method: Circle system utilized Preoxygenation: Pre-oxygenation with 100% oxygen Induction Type: IV induction Ventilation: Mask ventilation without difficulty Laryngoscope Size: Mac and 3 Grade View: Grade II Tube type: Oral Number of attempts: 1 Airway Equipment and Method: Stylet and Oral airway Placement Confirmation: ETT inserted through vocal cords under direct vision,  positive ETCO2 and breath sounds checked- equal and bilateral Tube secured with: Tape Dental Injury: Teeth and Oropharynx as per pre-operative assessment

## 2018-03-03 NOTE — Interval H&P Note (Signed)
History and Physical Interval Note:  03/03/2018 7:55 AM  [name]  has presented today for surgery, with the diagnosis of History Of Breast Cancer In Female History of reconstruction of both breasts  The various methods of treatment have been discussed with the patient and family. After consideration of risks, benefits and other options for treatment, the patient has consented to  Procedure(s): REMOVAL OF TISSUE EXPANDER AND PLACEMENT OF IMPLANT (Left) TISSUE EXPANDER PLACEMENT (Right) as a surgical intervention .  The patient's history has been reviewed, patient examined, no change in status, stable for surgery.  I have reviewed the patient's chart and labs.  Questions were answered to the patient's satisfaction.     [name] Dillingham

## 2018-03-03 NOTE — Op Note (Signed)
Op report Unilateral Breast Exchange   DATE OF OPERATION:  03/03/2018  LOCATION: [facility]  SURGICAL DIVISION: Plastic Surgery  PREOPERATIVE DIAGNOSES:  1. History of right breast cancer.  2. Acquired absence of right breast.   POSTOPERATIVE DIAGNOSES:  1. History of right breast cancer.  2. Acquired absence of right breast.   PROCEDURE:  1. Placement of right breast expander. 2. Capsulectomies of the right breast pocket.  SURGEON: Claire Sanger Dillingham, DO  ASSISTANT: Ronette Deter, PA  ANESTHESIA:  General.   COMPLICATIONS: None.   IMPLANTS: RIGHT:Mentor 455 cc. Ref #[id number].  Serial Number [id number], 100 cc of injectable saline placed in the expander.  INDICATIONS FOR PROCEDURE:  The patient, [name], is a 67 y.o. female born on 1951/01/11, is here for treatment for further treatment after a mastectomy and placement of a tissue expander. She now presents for implant.  She requires capsulotomies as well. MRN: [id number]  CONSENT:  Informed consent was obtained directly from the patient. Risks, benefits and alternatives were fully discussed. Specific risks including but not limited to bleeding, infection, hematoma, seroma, scarring, pain, implant infection, implant extrusion, capsular contracture, asymmetry, wound healing problems, and need for further surgery were all discussed. The patient did have an ample opportunity to have her questions answered to her satisfaction.   DESCRIPTION OF PROCEDURE:  The patient was taken to the operating room. SCDs were placed and IV antibiotics were given. The patient's chest was prepped and draped in a sterile fashion. A time out was performed and the implants to be used were identified.  Local with epinephrine was used to infiltrate the area.   Right:  The old latissimus scar at the inferior border was excised (sent to path).  The inferior mastectomy flap was raised to meet the inframammary fold. The  latissimus was split to expose the previous tissue expander pocket. Inspection of the pocket showed a very thick and scarred capsule. There was no sign of infection.  Circumferential capsulotomies were performed to allow for breast pocket expansion.  Hemostasis was ensured with electrocautery.  The pocket was irrigated with antibiotic solution. The medial inferior skin was contracted to the chest wall.  During the release there was an opening in the skin at that site.  The inferior medial capsule was excised.  This was closed with the 5-0 Monocryl vertical mattress sutures. Extensive release of the scar tissue was performed.  New gloves were placed.   The expander was prepared according to the manufacture guidelines, the air evacuated and then it was placed under the latissimus muscle.  The inferior and lateral tabs were used to secure the expander to the chest wall with 3-0 Monocryl.  The drain was placed laterally and secured to the skin with 3-0 Silk.    The latissimus muscle was secured to the inframammary flap with a 3-0 Monocryl suture.  An additional fascial layer was closed with 3-0 Monocryl.  The remaining skin was closed with 4-0 Monocryl deep dermal and 5-0 Monocryl subcuticular stitches.  Dermabond was applied.  The ABDs and breast binder were placed.  The patient tolerated the procedure well and there were no complications.  The patient was allowed to wake from anesthesia and taken to the recovery room in satisfactory condition.  Due to the scarring the decision was made to not perform the exchange on the left breast as the right will be a limiting factor to the final size.

## 2018-03-03 NOTE — Interval H&P Note (Signed)
History and Physical Interval Note:  03/03/2018 7:55 AM  [name]  has presented today for surgery, with the diagnosis of History Of Breast Cancer In Female History of reconstruction of both breasts  The various methods of treatment have been discussed with the patient and family. After consideration of risks, benefits and other options for treatment, the patient has consented to  Procedure(s): REMOVAL OF TISSUE EXPANDER AND PLACEMENT OF IMPLANT (Left) TISSUE EXPANDER PLACEMENT (Right) as a surgical intervention .  The patient's history has been reviewed, patient examined, no change in status, stable for surgery.  I have reviewed the patient's chart and labs.  Questions were answered to the patient's satisfaction.   Written consent updated to include the right breast expander placement.  [name] [name]

## 2018-03-04 ENCOUNTER — Encounter (HOSPITAL_BASED_OUTPATIENT_CLINIC_OR_DEPARTMENT_OTHER): Payer: Self-pay | Admitting: Plastic Surgery

## 2018-03-04 NOTE — Anesthesia Postprocedure Evaluation (Signed)
Anesthesia Post Note  Patient: [name]  Procedure(s) Performed: TISSUE EXPANDER PLACEMENT (Right Breast)     Patient location during evaluation: PACU Anesthesia Type: General Level of consciousness: awake and alert Pain management: pain level controlled Vital Signs Assessment: post-procedure vital signs reviewed and stable Respiratory status: spontaneous breathing, nonlabored ventilation, respiratory function stable and patient connected to nasal cannula oxygen Cardiovascular status: blood pressure returned to baseline and stable Postop Assessment: no apparent nausea or vomiting Anesthetic complications: no    Last Vitals:  Vitals:   03/03/18 1200 03/03/18 1227  BP: (!) 144/79 (!) 146/91  Pulse: 85 79  Resp: 19 18  Temp:  (!) 36.4 C  SpO2: 93% 95%    Last Pain:  Vitals:   03/03/18 1100  TempSrc:   PainSc: Asleep                 Effie Berkshire

## 2018-03-05 ENCOUNTER — Encounter: Payer: Medicare Other | Admitting: Physical Therapy

## 2018-03-09 ENCOUNTER — Encounter: Payer: Self-pay | Admitting: Plastic Surgery

## 2018-03-09 ENCOUNTER — Ambulatory Visit (INDEPENDENT_AMBULATORY_CARE_PROVIDER_SITE_OTHER): Payer: Medicare Other | Admitting: Physician Assistant

## 2018-03-09 VITALS — BP 126/78 | HR 84 | Ht 64.0 in | Wt 195.0 lb

## 2018-03-09 DIAGNOSIS — Z853 Personal history of malignant neoplasm of breast: Secondary | ICD-10-CM

## 2018-03-09 DIAGNOSIS — Z9889 Other specified postprocedural states: Secondary | ICD-10-CM

## 2018-03-09 DIAGNOSIS — Z9882 Breast implant status: Secondary | ICD-10-CM

## 2018-03-09 DIAGNOSIS — Z9011 Acquired absence of right breast and nipple: Secondary | ICD-10-CM

## 2018-03-09 NOTE — Progress Notes (Signed)
  Subjective:     Patient ID: [name], female   DOB: May 13, 1950, 67 y.o.   MRN: [id number]  HPI Very pleasant pt presents to the clinic s/p placement of tissue expander on the left on 11/6.  Pt reports very little pain since the surgery.  Dr. Marla Roe explained to the pt that there was significant scar tissue on the left to be resected.  The decision was made intraoperatively not to precede with the left implant as these was concern that it could result in asymmetry.  The pt has been doing a great job keeping up with drain output.  Today in clinic her output was 75 cc.  We put 50 cc of saline in her left breast.   Review of Systems  Constitutional: Negative.   HENT: Negative.   Eyes: Negative.   Respiratory: Negative.   Cardiovascular: Negative.   Gastrointestinal: Negative.   Skin: Positive for wound.  Neurological: Negative.   Psychiatric/Behavioral: Negative.        Objective:   Physical Exam  Constitutional: She is oriented to person, place, and time. She appears well-developed and well-nourished.  HENT:  Head: Normocephalic.  Eyes: Pupils are equal, round, and reactive to light.  Neck: Normal range of motion.  Pulmonary/Chest: Effort normal.  Abdominal: Soft.  Musculoskeletal: Normal range of motion.  Neurological: She is alert and oriented to person, place, and time.  Skin: Skin is warm and dry.  Well healed surgical incision noted of the left breast Visualized surgical incisions of the left breast. No edema. No erythema, 75 cc drain out put today in clinic  Psychiatric: She has a normal mood and affect. Her behavior is normal. Judgment and thought content normal.       Assessment:     S/p placement of left breast expander    Plan:     Put 50 cc saline in right breast today in clinic 100 cc was placed intraoperatively    We placed injectable saline in the Expander using a sterile technique: Right: 50 cc for a total of 150/ 455 cc

## 2018-03-10 ENCOUNTER — Encounter: Payer: Medicare Other | Admitting: Physical Therapy

## 2018-03-11 ENCOUNTER — Encounter: Payer: Self-pay | Admitting: Physical Therapy

## 2018-03-11 ENCOUNTER — Ambulatory Visit: Payer: Medicare Other | Admitting: Physical Therapy

## 2018-03-11 DIAGNOSIS — M6281 Muscle weakness (generalized): Secondary | ICD-10-CM

## 2018-03-11 DIAGNOSIS — M79645 Pain in left finger(s): Secondary | ICD-10-CM | POA: Diagnosis not present

## 2018-03-11 DIAGNOSIS — M25521 Pain in right elbow: Secondary | ICD-10-CM | POA: Diagnosis not present

## 2018-03-11 NOTE — Therapy (Signed)
[facility] [address] [place], [place], [address] Phone: [phone]   Fax:  [phone]  Physical Therapy Treatment  Patient Details  Name: [name] MRN: [id number] Date of Birth: May 23, 1950 Referring Provider (PT): Edmonia Lynch   Encounter Date: 03/11/2018  PT End of Session - 03/11/18 0920    Visit Number  7    Date for PT Re-Evaluation  04/02/18    Authorization Type  MCR    PT Start Time  0845    PT Stop Time  0930    PT Time Calculation (min)  45 min    Activity Tolerance  Patient tolerated treatment well    Behavior During Therapy  [facility] for tasks assessed/performed       Past Medical History:  Diagnosis Date  . Anemia    "years ago"  . Arthritis    "left hand; was in my knees" (09/24/2017)  . Breast cancer, right breast ([place]) 2004  . Bruises easily   . Chronic lower back pain   . Ductal carcinoma in situ (DCIS) of left breast 2018  . Genetic testing 04/17/2017   STAT Breast panel with reflex to Multi-Cancer panel (83 genes) @ [facility] - No pathogenic mutations detected  . History of bronchitis    when she was young  . Hypertension    no longer taking medications as of August 2019  . Hypothyroidism    takes Synthroid daily  . Joint pain   . Joint swelling   . Personal history of chemotherapy 04/2003  . Personal history of radiation therapy 2005  . Pneumonia    hx of when she was young  . Right elbow tendonitis   . Sleep apnea    has a machine but does not use (09/24/2017)  . Thyroid cancer ([place]) 2012  . Urinary frequency     Past Surgical History:  Procedure Laterality Date  . ANTERIOR CERVICAL DECOMP/DISCECTOMY FUSION  2008  . APPENDECTOMY  2007  . BREAST BIOPSY Right 02/28/2003   malignant  . BREAST BIOPSY Left 03/2017  . BREAST LUMPECTOMY Right 2004  . BREAST RECONSTRUCTION WITH PLACEMENT OF TISSUE EXPANDER AND FLEX HD (ACELLULAR HYDRATED DERMIS) Bilateral 06/22/2017   Procedure: IMMEDIATE BILATERAL BREAST RECONSTRUCTION WITH PLACEMENT OF TISSUE EXPANDER AND FLEX HD (ACELLULAR HYDRATED DERMIS);  Surgeon: Wallace Going, DO;  Location: [place];  Service: Plastics;  Laterality: Bilateral;  . CESAREAN SECTION  1981  . COLONOSCOPY  X 2  . JOINT REPLACEMENT    . KNEE ARTHROSCOPY Right 2007  . KNEE ARTHROSCOPY  05/02/2011   Procedure: ARTHROSCOPY KNEE;  Surgeon: Johnn Hai;  Location: [place];  Service: Orthopedics;  Laterality: Right;  . LATISSIMUS FLAP TO BREAST Right 09/23/2017   Procedure: RIGHT BREAST LATISSIMUS DORSI FLAP RECONSTRUCTION WITH TISSUE EXPANDER;  Surgeon: Wallace Going, DO;  Location: [place];  Service: Plastics;  Laterality: Right;  . MASTECTOMY W/ SENTINEL NODE BIOPSY Bilateral 06/22/2017   Procedure: LEFT MASTECTOMY WITH SENTINEL LYMPH NODE BIOPSY AND RIGHT PROPHYLACTIC MASTECTOMY;  Surgeon: Jovita Kussmaul, MD;  Location: [place];  Service: General;  Laterality: Bilateral;  . MENISCUS DEBRIDEMENT  05/02/2011   Procedure: DEBRIDEMENT OF MENISCUS;  Surgeon: Johnn Hai;  Location: [place];  Service: Orthopedics;  Laterality: Right;  . PORTA CATH REMOVAL  2005  . PORTACATH PLACEMENT  2004  . REMOVAL OF TISSUE EXPANDER AND PLACEMENT OF IMPLANT Right 12/12/2017  Procedure: REMOVAL OF TISSUE EXPANDER, RIGHT;  Surgeon: Irene Limbo, MD;  Location: [place];  Service: Plastics;  Laterality: Right;  . THYROIDECTOMY N/A 09/22/2012   Procedure: RIGHT THYROID LOBECTOMY WITH FROZEN SECTION;  Surgeon: Izora Gala, MD;  Location: [place];  Service: ENT;  Laterality: N/A;  . THYROIDECTOMY Left 10/21/2012   Procedure: COMPLETION OF THYROIDECTOMY;  Surgeon: Izora Gala, MD;  Location: [place];  Service: ENT;  Laterality: Left;  . TISSUE EXPANDER PLACEMENT Right 03/03/2018   Procedure: TISSUE EXPANDER PLACEMENT;  Surgeon: Wallace Going, DO;  Location: [place];  Service: Plastics;  Laterality: Right;  . TOTAL KNEE ARTHROPLASTY  04/14/2012   Procedure: TOTAL KNEE ARTHROPLASTY;  Surgeon: Ninetta Lights, MD;  Location: [place];  Service: Orthopedics;  Laterality: Right;  RIGHT ARTHROPLASTY KNEE MEDIAL/LATERAL COMPARTMENTS WITH PATELLA RESURFACING  . TOTAL KNEE ARTHROPLASTY Left 03/30/2013   Procedure: TOTAL KNEE ARTHROPLASTY;  Surgeon: Ninetta Lights, MD;  Location: [place];  Service: Orthopedics;  Laterality: Left;    There were no vitals filed for this visit.  Subjective Assessment - 03/11/18 0845    Subjective  Pt had new Rt breast expander placed on 11/6 and presents to PT with a doctor note approving continuation of PT as of 03/09/18.     Pertinent History  Breast CA 15 yr ago, thryroid CA, DCIS (precancer);  ant cerv fusion 2008                       [facility] Adult PT Treatment/Exercise - 03/11/18 0001      Exercises   Exercises  Hand      Hand Exercises   Thumb Opposition  AROM 20 reps    Other Hand Exercises  gentle grip squeezes x 20 reps with PT cueing for full thumb ROM      Modalities   Modalities  Cryotherapy      Cryotherapy   Number Minutes Cryotherapy  10 Minutes    Cryotherapy Location  --   Lt thumb   Type of Cryotherapy  Ice massage   frozen water bottle end of session     Manual Therapy   Manual Therapy  Soft tissue mobilization;Other (comment)    Soft tissue mobilization  left thumb massage to thenar eminence    Other Manual Therapy  ice stick massage to thenar eminence             PT Education - 03/11/18 0919    Education Details  ice techniques for lt thumb    Person(s) Educated  Patient    Methods  Explanation;Verbal cues;Demonstration    Comprehension  Verbalized understanding       PT Short Term Goals - 02/26/18 1110      PT SHORT TERM GOAL #1   Title  Ind with initial HEP  (Pended)     Status  Achieved  (Pended)       PT SHORT TERM GOAL #2   Title  decreased pain in the R  elbow by 50% with ADLS  (Pended)     Status  Achieved  (Pended)       PT SHORT TERM GOAL #3   Title  decreased pain in R hand by 50% with ADLS  (Pended)     Status  Achieved  (Pended)         PT Long Term Goals - 02/05/18 1004      PT LONG TERM GOAL #1  Title  Patient able to perform ADLs with R elbow and L hand pain of 2/10 or less    Time  8    Period  Weeks    Status  New    Target Date  04/02/18      PT LONG TERM GOAL #2   Title  Patient to demo 5/5 R wrist strength to ease ADLS    Time  8    Period  Weeks    Status  New      PT LONG TERM GOAL #3   Title  Pt independent and compliant in HEP to prevent further injury to her elbow and hand.    Time  8    Period  Weeks    Status  New      PT LONG TERM GOAL #4   Title  FOTO functional outcome improved to 37% indicating improved function with less pain     Time  8    Period  Weeks    Status  New      PT LONG TERM GOAL #5   Title  ---------------------------------------            Plan - 03/11/18 0920    Clinical Impression Statement  Pt presents s/p Rt breast tissue expander placement on 11/6 and has a note that she is safe to resume PT.  She states Rt elbow has improved by 80% but Lt thumb was a 9/10 for pain today.  PT discussed icing techniques for use of home and focused on manual therapy and ice massage today with AROM follow up by Pt.  She reported significant pain relief with these techniques and will benefit from continued PT to address pain and deficits along POC.    History and Personal Factors relevant to plan of care:  tissue expander placed Rt breast 11/6, ok'd by MD to resume PT 03/09/18    Clinical Presentation  Stable    Rehab Potential  Excellent    PT Frequency  2x / week    PT Duration  8 weeks    PT Treatment/Interventions  ADLs/Self Care Home Management;Cryotherapy;Electrical Stimulation;Iontophoresis 4mg /ml Dexamethasone;Moist Heat;Ultrasound;Patient/family education;Therapeutic  exercise;Manual techniques;Dry needling;Taping    PT Next Visit Plan  safe to resume PT following tissue expander Rt breast per MD note, continue manual therapy and modalities with ther ex as tolerated, DN     PT Home Exercise Plan  Access Code: [email or web address]    Consulted and Agree with Plan of Care  Patient       Patient will benefit from skilled therapeutic intervention in order to improve the following deficits and impairments:  Pain, Decreased range of motion, Impaired flexibility, Decreased strength  Visit Diagnosis: Thumb pain, left  Muscle weakness (generalized)     Problem List Patient Active Problem List   Diagnosis Date Noted  . History of breast cancer in female 01/27/2018  . History of reconstruction of both breasts 01/27/2018  . Acquired absence of right breast 09/23/2017  . Breast cancer ([place]) 06/22/2017  . 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 Genetic testing 04/17/2017  . DJD (degenerative joint disease) of knee 03/30/2013  . Unspecified sleep apnea 03/23/2013  . 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 Weakness 11/28/2011  . Hypertension 11/28/2011  . Hypokalemia 11/28/2011  . LUE weakness 11/28/2011  . Hypothyroidism 11/28/2011  . Thyroid cancer ([place]) 04/28/2010    Johanna Beuhring, PT 03/11/18 9:24 AM   [facility] [address], [place], [place], [address] Phone: [phone]   Fax:  [phone]  Name: MARLEIGH KAYLOR MRN: [id number] Date of Birth: 1950/10/17

## 2018-03-11 NOTE — Patient Instructions (Signed)
Use of ice cup massage or frozen water bottle for Lt thumb. Direct ice  = no more than 3-5 min, otherwise 10 min with layer between ice and skin

## 2018-03-12 ENCOUNTER — Encounter: Payer: Medicare Other | Admitting: Physical Therapy

## 2018-03-16 ENCOUNTER — Ambulatory Visit (INDEPENDENT_AMBULATORY_CARE_PROVIDER_SITE_OTHER): Payer: Medicare Other | Admitting: Plastic Surgery

## 2018-03-16 ENCOUNTER — Encounter: Payer: Self-pay | Admitting: Plastic Surgery

## 2018-03-16 VITALS — BP 132/76 | Ht 64.0 in | Wt 189.0 lb

## 2018-03-16 DIAGNOSIS — Z9889 Other specified postprocedural states: Secondary | ICD-10-CM

## 2018-03-16 DIAGNOSIS — Z9882 Breast implant status: Secondary | ICD-10-CM

## 2018-03-16 DIAGNOSIS — Z853 Personal history of malignant neoplasm of breast: Secondary | ICD-10-CM

## 2018-03-16 NOTE — Progress Notes (Signed)
   Subjective:    Patient ID: [name], female    DOB: Nov 06, 1950, 67 y.o.   MRN: [id number]  The patient is a 67 yrs old bf here for follow up on her right breast reconstruction.  An expander was placed.  Her drain output has been minimal.  There is no sign of seroma or hematoma. The incision is healing well.     Review of Systems  Constitutional: Negative.   Respiratory: Negative.   Cardiovascular: Negative.   Genitourinary: Negative.   Hematological: Negative.   Psychiatric/Behavioral: Negative.        Objective:   Physical Exam  Constitutional: She appears well-developed and well-nourished.  HENT:  Head: Normocephalic and atraumatic.  Eyes: Pupils are equal, round, and reactive to light.  Cardiovascular: Normal rate.  Pulmonary/Chest: Effort normal.  Neurological: She displays normal reflexes. She exhibits normal muscle tone.  Skin: Skin is warm.  Psychiatric: She has a normal mood and affect. Her behavior is normal. Judgment and thought content normal.       Assessment & Plan:  History of reconstruction of both breasts  History of breast cancer in female  S/P breast reconstruction, bilateral  We placed injectable saline in the Expander using a sterile technique: Right: 100 cc for a total of 250 cc. Drain removed.  May shower.

## 2018-03-17 ENCOUNTER — Ambulatory Visit: Payer: Medicare Other | Admitting: Physical Therapy

## 2018-03-17 ENCOUNTER — Encounter: Payer: Self-pay | Admitting: Physical Therapy

## 2018-03-17 ENCOUNTER — Encounter: Payer: Medicare Other | Admitting: Physical Therapy

## 2018-03-17 DIAGNOSIS — M79645 Pain in left finger(s): Secondary | ICD-10-CM

## 2018-03-17 DIAGNOSIS — C73 Malignant neoplasm of thyroid gland: Secondary | ICD-10-CM | POA: Diagnosis not present

## 2018-03-17 DIAGNOSIS — M6281 Muscle weakness (generalized): Secondary | ICD-10-CM | POA: Diagnosis not present

## 2018-03-17 DIAGNOSIS — M25521 Pain in right elbow: Secondary | ICD-10-CM | POA: Diagnosis not present

## 2018-03-17 DIAGNOSIS — E890 Postprocedural hypothyroidism: Secondary | ICD-10-CM | POA: Diagnosis not present

## 2018-03-17 NOTE — Therapy (Signed)
[facility] [address] [place], [place], [address] Phone: [phone]   Fax:  [phone]  Physical Therapy Treatment  Patient Details  Name: [name] MRN: [id number] Date of Birth: 1950/08/22 Referring Provider (PT): Edmonia Lynch   Encounter Date: 03/17/2018  PT End of Session - 03/17/18 0932    Visit Number  8    Date for PT Re-Evaluation  04/02/18    Authorization Type  MCR    PT Start Time  0930    PT Stop Time  1011    PT Time Calculation (min)  41 min    Activity Tolerance  Patient tolerated treatment well    Behavior During Therapy  [facility] for tasks assessed/performed       Past Medical History:  Diagnosis Date  . Anemia    "years ago"  . Arthritis    "left hand; was in my knees" (09/24/2017)  . Breast cancer, right breast ([place]) 2004  . Bruises easily   . Chronic lower back pain   . Ductal carcinoma in situ (DCIS) of left breast 2018  . Genetic testing 04/17/2017   STAT Breast panel with reflex to Multi-Cancer panel (83 genes) @ [facility] - No pathogenic mutations detected  . History of bronchitis    when she was young  . Hypertension    no longer taking medications as of August 2019  . Hypothyroidism    takes Synthroid daily  . Joint pain   . Joint swelling   . Personal history of chemotherapy 04/2003  . Personal history of radiation therapy 2005  . Pneumonia    hx of when she was young  . Right elbow tendonitis   . Sleep apnea    has a machine but does not use (09/24/2017)  . Thyroid cancer ([place]) 2012  . Urinary frequency     Past Surgical History:  Procedure Laterality Date  . ANTERIOR CERVICAL DECOMP/DISCECTOMY FUSION  2008  . APPENDECTOMY  2007  . BREAST BIOPSY Right 02/28/2003   malignant  . BREAST BIOPSY Left 03/2017  . BREAST LUMPECTOMY Right 2004  . BREAST RECONSTRUCTION WITH PLACEMENT OF TISSUE EXPANDER AND FLEX HD (ACELLULAR HYDRATED DERMIS) Bilateral 06/22/2017   Procedure: IMMEDIATE BILATERAL BREAST RECONSTRUCTION WITH PLACEMENT OF TISSUE EXPANDER AND FLEX HD (ACELLULAR HYDRATED DERMIS);  Surgeon: Wallace Going, DO;  Location: [place];  Service: Plastics;  Laterality: Bilateral;  . CESAREAN SECTION  1981  . COLONOSCOPY  X 2  . JOINT REPLACEMENT    . KNEE ARTHROSCOPY Right 2007  . KNEE ARTHROSCOPY  05/02/2011   Procedure: ARTHROSCOPY KNEE;  Surgeon: Johnn Hai;  Location: [place];  Service: Orthopedics;  Laterality: Right;  . LATISSIMUS FLAP TO BREAST Right 09/23/2017   Procedure: RIGHT BREAST LATISSIMUS DORSI FLAP RECONSTRUCTION WITH TISSUE EXPANDER;  Surgeon: Wallace Going, DO;  Location: [place];  Service: Plastics;  Laterality: Right;  . MASTECTOMY W/ SENTINEL NODE BIOPSY Bilateral 06/22/2017   Procedure: LEFT MASTECTOMY WITH SENTINEL LYMPH NODE BIOPSY AND RIGHT PROPHYLACTIC MASTECTOMY;  Surgeon: Jovita Kussmaul, MD;  Location: [place];  Service: General;  Laterality: Bilateral;  . MENISCUS DEBRIDEMENT  05/02/2011   Procedure: DEBRIDEMENT OF MENISCUS;  Surgeon: Johnn Hai;  Location: [place];  Service: Orthopedics;  Laterality: Right;  . PORTA CATH REMOVAL  2005  . PORTACATH PLACEMENT  2004  . REMOVAL OF TISSUE EXPANDER AND PLACEMENT OF IMPLANT Right 12/12/2017  Procedure: REMOVAL OF TISSUE EXPANDER, RIGHT;  Surgeon: Irene Limbo, MD;  Location: [place];  Service: Plastics;  Laterality: Right;  . THYROIDECTOMY N/A 09/22/2012   Procedure: RIGHT THYROID LOBECTOMY WITH FROZEN SECTION;  Surgeon: Izora Gala, MD;  Location: [place];  Service: ENT;  Laterality: N/A;  . THYROIDECTOMY Left 10/21/2012   Procedure: COMPLETION OF THYROIDECTOMY;  Surgeon: Izora Gala, MD;  Location: [place];  Service: ENT;  Laterality: Left;  . TISSUE EXPANDER PLACEMENT Right 03/03/2018   Procedure: TISSUE EXPANDER PLACEMENT;  Surgeon: Wallace Going, DO;  Location: [place];  Service: Plastics;  Laterality: Right;  . TOTAL KNEE ARTHROPLASTY  04/14/2012   Procedure: TOTAL KNEE ARTHROPLASTY;  Surgeon: Ninetta Lights, MD;  Location: [place];  Service: Orthopedics;  Laterality: Right;  RIGHT ARTHROPLASTY KNEE MEDIAL/LATERAL COMPARTMENTS WITH PATELLA RESURFACING  . TOTAL KNEE ARTHROPLASTY Left 03/30/2013   Procedure: TOTAL KNEE ARTHROPLASTY;  Surgeon: Ninetta Lights, MD;  Location: [place];  Service: Orthopedics;  Laterality: Left;    There were no vitals filed for this visit.  Subjective Assessment - 03/17/18 0932    Subjective  Pt had another Rt breast proceduce since last visit.  Bulb was removed and has had another saline injection into Rt breast yesterday.  Pt reported Lt thumb treatment really helped last visit - relief lasted a few days.      Pertinent History  Breast CA 15 yr ago, thryroid CA, DCIS (precancer);  ant cerv fusion 2008    Patient Stated Goals  get rid of pain    Currently in Pain?  Yes    Pain Score  7     Pain Location  Hand   thenar eminence   Pain Orientation  Left    Pain Descriptors / Indicators  Burning;Sore    Pain Onset  More than a month ago    Pain Frequency  Constant    Multiple Pain Sites  Yes    Pain Score  6    Pain Location  Elbow    Pain Orientation  Right;Medial    Pain Descriptors / Indicators  Aching    Pain Type  Chronic pain    Pain Onset  More than a month ago    Pain Frequency  Constant                       [facility] Adult PT Treatment/Exercise - 03/17/18 0001      Exercises   Exercises  Hand      Hand Exercises   Thumb Opposition  AROM 20 reps   AROM thumb abduction, flexion, extension x 10 reps each   Other Hand Exercises  gentle grip squeezes x 20 reps with PT cueing for full thumb ROM    Other Hand Exercises  digiflex 3.0 2x15 Lt hand      Modalities   Modalities  Cryotherapy      Cryotherapy   Number Minutes Cryotherapy  10 Minutes    Type of Cryotherapy  Ice massage   ice  stick performed by PT     Manual Therapy   Manual Therapy  Soft tissue mobilization;Other (comment);Joint mobilization    Joint Mobilization  CMC joint Lt thumb Gr II/III to improve ext, abd    Soft tissue mobilization  left thumb massage to thenar eminence    Other Manual Therapy  ice stick massage to thenar eminence  PT Short Term Goals - 02/26/18 1110      PT SHORT TERM GOAL #1   Title  Ind with initial HEP  (Pended)     Status  Achieved  (Pended)       PT SHORT TERM GOAL #2   Title  decreased pain in the R elbow by 50% with ADLS  (Pended)     Status  Achieved  (Pended)       PT SHORT TERM GOAL #3   Title  decreased pain in R hand by 50% with ADLS  (Pended)     Status  Achieved  (Pended)         PT Long Term Goals - 02/05/18 1004      PT LONG TERM GOAL #1   Title  Patient able to perform ADLs with R elbow and L hand pain of 2/10 or less    Time  8    Period  Weeks    Status  New    Target Date  04/02/18      PT LONG TERM GOAL #2   Title  Patient to demo 5/5 R wrist strength to ease ADLS    Time  8    Period  Weeks    Status  New      PT LONG TERM GOAL #3   Title  Pt independent and compliant in HEP to prevent further injury to her elbow and hand.    Time  8    Period  Weeks    Status  New      PT LONG TERM GOAL #4   Title  FOTO functional outcome improved to 37% indicating improved function with less pain     Time  8    Period  Weeks    Status  New      PT LONG TERM GOAL #5   Title  ---------------------------------------            Plan - 03/17/18 1007    Clinical Impression Statement  Pt had another Rt breast procedure yesterday and all is progressing well.  Lt thumb had several days of relief last visit.  PT performed ice massage, STM, jt mobs to Lt [facility] joint and added ROM with emphasis on full ROM through [facility] joint and strengthening via digiflex today which Pt stated she felt good stretch with throughout thumb and thenar  eminence.  She reports her Rt elbow continues to be improved and wanted to again focus on lt thumb today.  She will continue to benefit from skilled PT to address pain and deficits along POC.    Rehab Potential  Excellent    PT Frequency  2x / week    PT Duration  8 weeks    PT Treatment/Interventions  ADLs/Self Care Home Management;Cryotherapy;Electrical Stimulation;Iontophoresis 4mg /ml Dexamethasone;Moist Heat;Ultrasound;Patient/family education;Therapeutic exercise;Manual techniques;Dry needling;Taping    PT Next Visit Plan  safe to resume PT following tissue expander Rt breast per MD note, continue manual therapy and modalities with ther ex as tolerated, DN     PT Home Exercise Plan  Access Code: [email or web address]       Patient will benefit from skilled therapeutic intervention in order to improve the following deficits and impairments:  Pain, Decreased range of motion, Impaired flexibility, Decreased strength  Visit Diagnosis: Thumb pain, left  Muscle weakness (generalized)     Problem List Patient Active Problem List   Diagnosis Date Noted  . S/P breast reconstruction, bilateral 03/16/2018  . History of  breast cancer in female 01/27/2018  . History of reconstruction of both breasts 01/27/2018  . Acquired absence of right breast 09/23/2017  . Breast cancer ([place]) 06/22/2017  . 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 Genetic testing 04/17/2017  . DJD (degenerative joint disease) of knee 03/30/2013  . Unspecified sleep apnea 03/23/2013  . 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 Weakness 11/28/2011  . Hypertension 11/28/2011  . Hypokalemia 11/28/2011  . LUE weakness 11/28/2011  . Hypothyroidism 11/28/2011  . Thyroid cancer ([place]) 04/28/2010   Johanna Beuhring, PT 03/17/18 10:14 AM   [place] [facility] [address] [place], [place], [address] Phone: [phone]   Fax:  [phone]  Name: [name] MRN:  [id number] Date of Birth: [birth year]

## 2018-03-19 ENCOUNTER — Encounter: Payer: Medicare Other | Admitting: Physical Therapy

## 2018-03-22 ENCOUNTER — Ambulatory Visit: Payer: Medicare Other | Admitting: Physical Therapy

## 2018-03-22 ENCOUNTER — Encounter: Payer: Self-pay | Admitting: Physical Therapy

## 2018-03-22 DIAGNOSIS — M25521 Pain in right elbow: Secondary | ICD-10-CM | POA: Diagnosis not present

## 2018-03-22 DIAGNOSIS — M79645 Pain in left finger(s): Secondary | ICD-10-CM | POA: Diagnosis not present

## 2018-03-22 DIAGNOSIS — M6281 Muscle weakness (generalized): Secondary | ICD-10-CM | POA: Diagnosis not present

## 2018-03-22 NOTE — Therapy (Signed)
[facility] [address] [place], [place], [address] Phone: [phone]   Fax:  [phone]  Physical Therapy Treatment  Patient Details  Name: [name] MRN: [id number] Date of Birth: Nov 26, 1950 Referring Provider (PT): Edmonia Lynch   Encounter Date: 03/22/2018  PT End of Session - 03/22/18 1057    Visit Number  9    Date for PT Re-Evaluation  04/02/18    Authorization Type  MCR    PT Start Time  1020    PT Stop Time  1059    PT Time Calculation (min)  39 min    Activity Tolerance  Patient tolerated treatment well    Behavior During Therapy  [facility] for tasks assessed/performed       Past Medical History:  Diagnosis Date  . Anemia    "years ago"  . Arthritis    "left hand; was in my knees" (09/24/2017)  . Breast cancer, right breast ([place]) 2004  . Bruises easily   . Chronic lower back pain   . Ductal carcinoma in situ (DCIS) of left breast 2018  . Genetic testing 04/17/2017   STAT Breast panel with reflex to Multi-Cancer panel (83 genes) @ [facility] - No pathogenic mutations detected  . History of bronchitis    when she was young  . Hypertension    no longer taking medications as of August 2019  . Hypothyroidism    takes Synthroid daily  . Joint pain   . Joint swelling   . Personal history of chemotherapy 04/2003  . Personal history of radiation therapy 2005  . Pneumonia    hx of when she was young  . Right elbow tendonitis   . Sleep apnea    has a machine but does not use (09/24/2017)  . Thyroid cancer ([place]) 2012  . Urinary frequency     Past Surgical History:  Procedure Laterality Date  . ANTERIOR CERVICAL DECOMP/DISCECTOMY FUSION  2008  . APPENDECTOMY  2007  . BREAST BIOPSY Right 02/28/2003   malignant  . BREAST BIOPSY Left 03/2017  . BREAST LUMPECTOMY Right 2004  . BREAST RECONSTRUCTION WITH PLACEMENT OF TISSUE EXPANDER AND FLEX HD (ACELLULAR HYDRATED DERMIS) Bilateral 06/22/2017    Procedure: IMMEDIATE BILATERAL BREAST RECONSTRUCTION WITH PLACEMENT OF TISSUE EXPANDER AND FLEX HD (ACELLULAR HYDRATED DERMIS);  Surgeon: Wallace Going, DO;  Location: [place];  Service: Plastics;  Laterality: Bilateral;  . CESAREAN SECTION  1981  . COLONOSCOPY  X 2  . JOINT REPLACEMENT    . KNEE ARTHROSCOPY Right 2007  . KNEE ARTHROSCOPY  05/02/2011   Procedure: ARTHROSCOPY KNEE;  Surgeon: Johnn Hai;  Location: [place];  Service: Orthopedics;  Laterality: Right;  . LATISSIMUS FLAP TO BREAST Right 09/23/2017   Procedure: RIGHT BREAST LATISSIMUS DORSI FLAP RECONSTRUCTION WITH TISSUE EXPANDER;  Surgeon: Wallace Going, DO;  Location: [place];  Service: Plastics;  Laterality: Right;  . MASTECTOMY W/ SENTINEL NODE BIOPSY Bilateral 06/22/2017   Procedure: LEFT MASTECTOMY WITH SENTINEL LYMPH NODE BIOPSY AND RIGHT PROPHYLACTIC MASTECTOMY;  Surgeon: Jovita Kussmaul, MD;  Location: [place];  Service: General;  Laterality: Bilateral;  . MENISCUS DEBRIDEMENT  05/02/2011   Procedure: DEBRIDEMENT OF MENISCUS;  Surgeon: Johnn Hai;  Location: [place];  Service: Orthopedics;  Laterality: Right;  . PORTA CATH REMOVAL  2005  . PORTACATH PLACEMENT  2004  . REMOVAL OF TISSUE EXPANDER AND PLACEMENT OF IMPLANT Right  12/12/2017   Procedure: REMOVAL OF TISSUE EXPANDER, RIGHT;  Surgeon: Irene Limbo, MD;  Location: [place];  Service: Plastics;  Laterality: Right;  . THYROIDECTOMY N/A 09/22/2012   Procedure: RIGHT THYROID LOBECTOMY WITH FROZEN SECTION;  Surgeon: Izora Gala, MD;  Location: [place];  Service: ENT;  Laterality: N/A;  . THYROIDECTOMY Left 10/21/2012   Procedure: COMPLETION OF THYROIDECTOMY;  Surgeon: Izora Gala, MD;  Location: [place];  Service: ENT;  Laterality: Left;  . TISSUE EXPANDER PLACEMENT Right 03/03/2018   Procedure: TISSUE EXPANDER PLACEMENT;  Surgeon: Wallace Going, DO;  Location: [place];  Service: Plastics;  Laterality: Right;  . TOTAL KNEE ARTHROPLASTY  04/14/2012   Procedure: TOTAL KNEE ARTHROPLASTY;  Surgeon: Ninetta Lights, MD;  Location: [place];  Service: Orthopedics;  Laterality: Right;  RIGHT ARTHROPLASTY KNEE MEDIAL/LATERAL COMPARTMENTS WITH PATELLA RESURFACING  . TOTAL KNEE ARTHROPLASTY Left 03/30/2013   Procedure: TOTAL KNEE ARTHROPLASTY;  Surgeon: Ninetta Lights, MD;  Location: [place];  Service: Orthopedics;  Laterality: Left;    There were no vitals filed for this visit.  Subjective Assessment - 03/22/18 1022    Subjective  Pt had bad pain in Lt thenar eminence/thumb in Sat night bad enough to disrupt sleep.  Feels that weather may be influencing this.      Pertinent History  Breast CA 15 yr ago, thryroid CA, DCIS (precancer);  ant cerv fusion 2008    Patient Stated Goals  get rid of pain    Currently in Pain?  Yes    Pain Score  8     Pain Location  Hand   Left thumb   Pain Orientation  Left    Pain Descriptors / Indicators  Aching;Burning    Pain Type  Chronic pain    Pain Onset  More than a month ago    Pain Frequency  Constant                       [facility] Adult PT Treatment/Exercise - 03/22/18 0001      Exercises   Exercises  Hand      Hand Exercises   Thumb Opposition  AROM 20 reps   AROM thumb abduction, flexion, extension x 10 reps each   Digit Abduction/Adduction  20 reps Lt hand   emphasis on thumb A/ROM   Other Hand Exercises  gentle grip squeezes x 20 reps with PT cueing for full thumb ROM    Other Hand Exercises  digiflex 3.0 2x15 Lt hand      Modalities   Modalities  Cryotherapy      Cryotherapy   Number Minutes Cryotherapy  8 Minutes    Type of Cryotherapy  Ice massage   ice stick, Lt thenar eminence     Manual Therapy   Manual Therapy  Joint mobilization;Soft tissue mobilization;Passive ROM    Joint Mobilization  CMC joint Lt thumb Gr II/III to improve ext, abd    Soft tissue mobilization  left thumb  massage to thenar eminence    Passive ROM  wrist extension, radial deviation,  Lt thumb at [facility] jt all planes    Other Manual Therapy  ice stick massage to thenar eminence               PT Short Term Goals - 02/26/18 1110      PT SHORT TERM GOAL #1   Title  Ind with initial HEP  (Pended)  Status  Achieved  (Pended)       PT SHORT TERM GOAL #2   Title  decreased pain in the R elbow by 50% with ADLS  (Pended)     Status  Achieved  (Pended)       PT SHORT TERM GOAL #3   Title  decreased pain in R hand by 50% with ADLS  (Pended)     Status  Achieved  (Pended)         PT Long Term Goals - 02/05/18 1004      PT LONG TERM GOAL #1   Title  Patient able to perform ADLs with R elbow and L hand pain of 2/10 or less    Time  8    Period  Weeks    Status  New    Target Date  04/02/18      PT LONG TERM GOAL #2   Title  Patient to demo 5/5 R wrist strength to ease ADLS    Time  8    Period  Weeks    Status  New      PT LONG TERM GOAL #3   Title  Pt independent and compliant in HEP to prevent further injury to her elbow and hand.    Time  8    Period  Weeks    Status  New      PT LONG TERM GOAL #4   Title  FOTO functional outcome improved to 37% indicating improved function with less pain     Time  8    Period  Weeks    Status  New      PT LONG TERM GOAL #5   Title  ---------------------------------------            Plan - 03/22/18 1058    Clinical Impression Statement  Pt continues to have approx. 3 days of relief in Lt thumb pain with PT.  She reported reduction of pain from 8/10 to 6/10 with treatment today.  PT focused on soft tissue mobilization, joint mobilization and PROM with focus on thenar eminence and CMC joint.  Pt will benefit from continued manual therapy and progression of strengthening and continued A/ROM for hand, wrist and forearm to better support the joints of the thumb.      Rehab Potential  Excellent    PT Frequency  2x / week    PT  Duration  8 weeks    PT Treatment/Interventions  ADLs/Self Care Home Management;Cryotherapy;Electrical Stimulation;Iontophoresis 4mg /ml Dexamethasone;Moist Heat;Ultrasound;Patient/family education;Therapeutic exercise;Manual techniques;Dry needling;Taping    PT Next Visit Plan  safe to resume PT following tissue expander Rt breast per MD note, continue manual therapy and modalities with ther ex as tolerated, DN     PT Home Exercise Plan  Access Code: [email or web address]    Consulted and Agree with Plan of Care  Patient       Patient will benefit from skilled therapeutic intervention in order to improve the following deficits and impairments:  Pain, Decreased range of motion, Impaired flexibility, Decreased strength  Visit Diagnosis: Thumb pain, left  Muscle weakness (generalized)     Problem List Patient Active Problem List   Diagnosis Date Noted  . S/P breast reconstruction, bilateral 03/16/2018  . History of breast cancer in female 01/27/2018  . History of reconstruction of both breasts 01/27/2018  . Acquired absence of right breast 09/23/2017  . Breast cancer ([place]) 06/22/2017  . 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  Genetic testing 04/17/2017  . DJD (degenerative joint disease) of knee 03/30/2013  . Unspecified sleep apnea 03/23/2013  . 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 Weakness 11/28/2011  . Hypertension 11/28/2011  . Hypokalemia 11/28/2011  . LUE weakness 11/28/2011  . Hypothyroidism 11/28/2011  . Thyroid cancer ([place]) 04/28/2010    Anneke Cundy, PT 03/22/18 10:59 AM   [facility] [address] [place], [place], [address] Phone: [phone]   Fax:  [phone]  Name: [name] MRN: [id number] Date of Birth: 09-26-1950

## 2018-03-23 ENCOUNTER — Encounter: Payer: Self-pay | Admitting: Plastic Surgery

## 2018-03-23 ENCOUNTER — Ambulatory Visit (INDEPENDENT_AMBULATORY_CARE_PROVIDER_SITE_OTHER): Payer: Medicare Other | Admitting: Plastic Surgery

## 2018-03-23 VITALS — BP 128/88 | HR 80 | Resp 14 | Ht 64.0 in | Wt 195.0 lb

## 2018-03-23 DIAGNOSIS — Z9882 Breast implant status: Secondary | ICD-10-CM

## 2018-03-23 DIAGNOSIS — Z9889 Other specified postprocedural states: Secondary | ICD-10-CM

## 2018-03-23 DIAGNOSIS — Z853 Personal history of malignant neoplasm of breast: Secondary | ICD-10-CM

## 2018-03-23 DIAGNOSIS — Z9011 Acquired absence of right breast and nipple: Secondary | ICD-10-CM

## 2018-03-23 NOTE — Progress Notes (Signed)
   Subjective:    Patient ID: [name], female    DOB: 03-15-1951, 67 y.o.   MRN: [id number]  The patient is a 66 yrs old bf here for follow-up on her right breast expander placement.  There does not appear to be any fluid collection.  No sign of infection.  The expander is high at the moment.  She has noticed a dry scab on the inframammary fold medially of the right breast.  This is where it was adherent to the rib.  It appears to be healing well.   Review of Systems  Constitutional: Negative.   HENT: Negative.   Eyes: Negative.   Respiratory: Negative.   Gastrointestinal: Negative.   Endocrine: Negative.   Genitourinary: Negative.   Musculoskeletal: Negative.   Skin: Negative.  Negative for color change and wound.      Objective:   Physical Exam  Constitutional: She appears well-developed and well-nourished.  HENT:  Head: Normocephalic and atraumatic.  Cardiovascular: Normal rate.  Pulmonary/Chest: Effort normal.  Neurological: She is alert.  Psychiatric: She has a normal mood and affect. Her behavior is normal. Judgment and thought content normal.       Assessment & Plan:  History of breast cancer in female  History of reconstruction of both breasts  Acquired absence of right breast  S/P breast reconstruction, bilateral  We placed injectable saline in the Expander using a sterile technique: Right: 50 cc for a total of 300 cc Light massage to the area.  Follow-up in 2 weeks.

## 2018-03-24 DIAGNOSIS — C73 Malignant neoplasm of thyroid gland: Secondary | ICD-10-CM | POA: Diagnosis not present

## 2018-03-24 DIAGNOSIS — E890 Postprocedural hypothyroidism: Secondary | ICD-10-CM | POA: Diagnosis not present

## 2018-03-24 DIAGNOSIS — I10 Essential (primary) hypertension: Secondary | ICD-10-CM | POA: Diagnosis not present

## 2018-03-29 ENCOUNTER — Ambulatory Visit: Payer: Medicare Other | Admitting: Physical Therapy

## 2018-03-31 ENCOUNTER — Encounter: Payer: Medicare Other | Admitting: Physical Therapy

## 2018-04-01 ENCOUNTER — Ambulatory Visit: Payer: Medicare Other | Admitting: Physical Therapy

## 2018-04-02 ENCOUNTER — Encounter: Payer: Medicare Other | Admitting: Physical Therapy

## 2018-04-06 ENCOUNTER — Ambulatory Visit (INDEPENDENT_AMBULATORY_CARE_PROVIDER_SITE_OTHER): Payer: Medicare Other | Admitting: Plastic Surgery

## 2018-04-06 ENCOUNTER — Encounter: Payer: Self-pay | Admitting: Physical Therapy

## 2018-04-06 ENCOUNTER — Ambulatory Visit: Payer: Medicare Other | Attending: Orthopedic Surgery | Admitting: Physical Therapy

## 2018-04-06 ENCOUNTER — Encounter: Payer: Self-pay | Admitting: Physician Assistant

## 2018-04-06 VITALS — BP 115/85 | HR 85 | Resp 14 | Ht 64.0 in | Wt 192.5 lb

## 2018-04-06 DIAGNOSIS — M25521 Pain in right elbow: Secondary | ICD-10-CM | POA: Diagnosis not present

## 2018-04-06 DIAGNOSIS — M79645 Pain in left finger(s): Secondary | ICD-10-CM | POA: Insufficient documentation

## 2018-04-06 DIAGNOSIS — M6281 Muscle weakness (generalized): Secondary | ICD-10-CM | POA: Insufficient documentation

## 2018-04-06 DIAGNOSIS — Z9889 Other specified postprocedural states: Secondary | ICD-10-CM

## 2018-04-06 NOTE — Therapy (Addendum)
[facility] [address], [place], [place], [address] Phone: [phone]   Fax:  [phone]  Physical Therapy Treatment/Recertification   Patient Details  Name: [name] MRN: [id number] Date of Birth: 1951-02-15 Referring Provider (PT): Edmonia Lynch    Progress Note Reporting Period 02/05/18 to 04/06/18  See note below for Objective Data and Assessment of Progress/Goals.      Encounter Date: 04/06/2018  PT End of Session - 04/06/18 0940    Visit Number  10    Date for PT Re-Evaluation  05/28/18    Authorization Type  MCR    PT Start Time  0846    PT Stop Time  0930    PT Time Calculation (min)  44 min    Activity Tolerance  Patient tolerated treatment well       Past Medical History:  Diagnosis Date  . Anemia    "years ago"  . Arthritis    "left hand; was in my knees" (09/24/2017)  . Breast cancer, right breast ([place]) 2004  . Bruises easily   . Chronic lower back pain   . Ductal carcinoma in situ (DCIS) of left breast 2018  . Genetic testing 04/17/2017   STAT Breast panel with reflex to Multi-Cancer panel (83 genes) @ [facility] - No pathogenic mutations detected  . History of bronchitis    when she was young  . Hypertension    no longer taking medications as of August 2019  . Hypothyroidism    takes Synthroid daily  . Joint pain   . Joint swelling   . Personal history of chemotherapy 04/2003  . Personal history of radiation therapy 2005  . Pneumonia    hx of when she was young  . Right elbow tendonitis   . Sleep apnea    has a machine but does not use (09/24/2017)  . Thyroid cancer ([place]) 2012  . Urinary frequency     Past Surgical History:  Procedure Laterality Date  . ANTERIOR CERVICAL DECOMP/DISCECTOMY FUSION  2008  . APPENDECTOMY  2007  . BREAST BIOPSY Right 02/28/2003   malignant  . BREAST BIOPSY Left 03/2017  . BREAST LUMPECTOMY Right 2004  . BREAST RECONSTRUCTION WITH  PLACEMENT OF TISSUE EXPANDER AND FLEX HD (ACELLULAR HYDRATED DERMIS) Bilateral 06/22/2017   Procedure: IMMEDIATE BILATERAL BREAST RECONSTRUCTION WITH PLACEMENT OF TISSUE EXPANDER AND FLEX HD (ACELLULAR HYDRATED DERMIS);  Surgeon: Wallace Going, DO;  Location: [place];  Service: Plastics;  Laterality: Bilateral;  . CESAREAN SECTION  1981  . COLONOSCOPY  X 2  . JOINT REPLACEMENT    . KNEE ARTHROSCOPY Right 2007  . KNEE ARTHROSCOPY  05/02/2011   Procedure: ARTHROSCOPY KNEE;  Surgeon: Johnn Hai;  Location: [place];  Service: Orthopedics;  Laterality: Right;  . LATISSIMUS FLAP TO BREAST Right 09/23/2017   Procedure: RIGHT BREAST LATISSIMUS DORSI FLAP RECONSTRUCTION WITH TISSUE EXPANDER;  Surgeon: Wallace Going, DO;  Location: [place];  Service: Plastics;  Laterality: Right;  . MASTECTOMY W/ SENTINEL NODE BIOPSY Bilateral 06/22/2017   Procedure: LEFT MASTECTOMY WITH SENTINEL LYMPH NODE BIOPSY AND RIGHT PROPHYLACTIC MASTECTOMY;  Surgeon: Jovita Kussmaul, MD;  Location: [place];  Service: General;  Laterality: Bilateral;  . MENISCUS DEBRIDEMENT  05/02/2011   Procedure: DEBRIDEMENT OF MENISCUS;  Surgeon: Johnn Hai;  Location: [place];  Service: Orthopedics;  Laterality: Right;  . PORTA CATH REMOVAL  2005  . PORTACATH  PLACEMENT  2004  . REMOVAL OF TISSUE EXPANDER AND PLACEMENT OF IMPLANT Right 12/12/2017   Procedure: REMOVAL OF TISSUE EXPANDER, RIGHT;  Surgeon: Irene Limbo, MD;  Location: [place];  Service: Plastics;  Laterality: Right;  . THYROIDECTOMY N/A 09/22/2012   Procedure: RIGHT THYROID LOBECTOMY WITH FROZEN SECTION;  Surgeon: Izora Gala, MD;  Location: [place];  Service: ENT;  Laterality: N/A;  . THYROIDECTOMY Left 10/21/2012   Procedure: COMPLETION OF THYROIDECTOMY;  Surgeon: Izora Gala, MD;  Location: [place];  Service: ENT;  Laterality: Left;  . TISSUE EXPANDER PLACEMENT Right 03/03/2018   Procedure:  TISSUE EXPANDER PLACEMENT;  Surgeon: Wallace Going, DO;  Location: [place];  Service: Plastics;  Laterality: Right;  . TOTAL KNEE ARTHROPLASTY  04/14/2012   Procedure: TOTAL KNEE ARTHROPLASTY;  Surgeon: Ninetta Lights, MD;  Location: [place];  Service: Orthopedics;  Laterality: Right;  RIGHT ARTHROPLASTY KNEE MEDIAL/LATERAL COMPARTMENTS WITH PATELLA RESURFACING  . TOTAL KNEE ARTHROPLASTY Left 03/30/2013   Procedure: TOTAL KNEE ARTHROPLASTY;  Surgeon: Ninetta Lights, MD;  Location: [place];  Service: Orthopedics;  Laterality: Left;    There were no vitals filed for this visit.  Subjective Assessment - 04/06/18 0844    Subjective  I've been fighting getting sick.  Saw the Dr. Marla Roe this morning for the right implant injections.  This thumb has been hurting the past 2 weeks.  Elbow has been better.      Pertinent History  Breast CA 15 yr ago, thryroid CA, DCIS (precancer);  ant cerv fusion 2008    Currently in Pain?  Yes    Pain Location  Hand    Pain Orientation  Left         [facility] PT Assessment - 04/06/18 0001      Assessment   Medical Diagnosis  left hand pain and rightt med/lat epicondylitis    Referring Provider (PT)  Edmonia Lynch    Onset Date/Surgical Date  05/29/17    Hand Dominance  Right      Observation/Other Assessments   Focus on Therapeutic Outcomes (FOTO)   50% limited      AROM   Right Wrist Extension  65 Degrees    Right Wrist Flexion  60 Degrees      Strength   Overall Strength Comments  R elbow 5/5; right wrist 5/5 L thumb flexion  4/5 in flex/ABD painful, ADD; R grip 43#, L grip 20#                   [facility] Adult PT Treatment/Exercise - 04/06/18 0001      Self-Care   Self-Care  Other Self-Care Comments    Other Self-Care Comments   paraffin wax for home pain control       Hand Exercises   Thumb Opposition  AROM5 reps   AROM thumb abduction, flexion, extension x 10 reps each     Moist Heat Therapy   Number  Minutes Moist Heat  5 Minutes    Moist Heat Location  Hand      Manual Therapy   Manual therapy comments  median nerve glides 10x    Joint Mobilization  CMC joint Lt thumb Gr II/III to improve ext, abd with slight distraction        Trigger Point Dry Needling - 04/06/18 0930    Consent Given?  Yes    Muscles Treated Upper Body  --   left abductor pollicis and brevis  PT Short Term Goals - 04/06/18 0950      PT SHORT TERM GOAL #1   Title  Ind with initial HEP    Status  Achieved      PT SHORT TERM GOAL #2   Title  decreased pain in the R elbow by 50% with ADLS    Status  Achieved      PT SHORT TERM GOAL #3   Title  decreased pain in R hand by 50% with ADLS    Time  4    Period  Weeks    Status  On-going        PT Long Term Goals - 04/06/18 7482      PT LONG TERM GOAL #1   Title  Patient able to perform ADLs with R elbow and L hand pain of 2/10 or less    Time  6    Period  Weeks    Status  On-going    Target Date  05/28/18      PT LONG TERM GOAL #2   Title  Patient to demo 5/5 R wrist strength to ease ADLS    Status  Achieved      PT LONG TERM GOAL #3   Title  Pt independent and compliant in HEP to prevent further injury to her elbow and hand.    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  She has no history of lymphadema or no current swelling in her thumb.  Recommend a home paraffin unit for home pain control.  She has numerous tender points in left  abductor pollicis and brevis.  She has a good twitch response with DN which is a good prognostic indicator.  Her right elbow strength is much improved.  Her FOTO functional outcome score is more impaired secondary to thumb pain flare up.  Recommend continued treatment at decreased frequency to 1x/week with focus on left thumb pain.      Rehab Potential  Excellent    PT Frequency  1x / week    PT Duration  8 weeks    PT Treatment/Interventions  ADLs/Self Care Home Management;Cryotherapy;Electrical Stimulation;Iontophoresis 4mg /ml Dexamethasone;Moist Heat;Ultrasound;Patient/family education;Therapeutic exercise;Manual techniques;Dry needling;Taping    PT Next Visit Plan  assess response to DN to left thenar musculature;  manual therapy; neural glides    PT Home Exercise Plan  Access Code: [email or web address]       Patient will benefit from skilled therapeutic intervention in order to improve the following deficits and impairments:  Pain, Decreased range of motion, Impaired flexibility, Decreased strength  Visit Diagnosis: Thumb pain, left - Plan: PT plan of care cert/re-cert  Muscle weakness (generalized) - Plan: PT plan of care cert/re-cert  Pain in right elbow - Plan: PT plan of care cert/re-cert     Problem List Patient Active Problem List   Diagnosis Date Noted  . S/P breast reconstruction, bilateral 03/16/2018  . History of breast cancer in female 01/27/2018  . History of reconstruction of both breasts 01/27/2018  . Acquired absence of right breast 09/23/2017  . Breast cancer ([place]) 06/22/2017  . 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 Genetic testing 04/17/2017  . DJD (degenerative joint disease) of knee 03/30/2013  . Unspecified sleep apnea 03/23/2013  . 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 Weakness 11/28/2011  . Hypertension 11/28/2011  . Hypokalemia 11/28/2011  . LUE weakness 11/28/2011  . Hypothyroidism 11/28/2011  . Thyroid cancer ([place]) 04/28/2010   Ruben Im,  PT 04/06/18 9:56 AM Phone: [phone] Fax: [phone]  Alvera Singh 04/06/2018, 9:53 AM  [facility] [address], [place], [place], [address] Phone: [phone]   Fax:  [phone]  Name: [name] MRN: [id number] Date of Birth: [birth year]

## 2018-04-06 NOTE — Progress Notes (Signed)
   Subjective:    Patient ID: [name], female    DOB: [birth year], 67 y.o.   MRN: [id number]  [name] is a 67 year old bf here for follow-up on her right breast reconstruction.  She had mastectomy followed by reconstruction with latissimus and expander.  She had an infection that required removal of the expander for period of time.  We put the expander back.  She is doing really well at this time.  She had a lot of scar tissue.  She does seem to be doing well with expansion.  There is no sign of infection or seroma.  Her pain is well controlled.  She denies any fevers.  Review of Systems  Constitutional: Negative.   HENT: Negative.   Eyes: Negative.   Respiratory: Negative.   Cardiovascular: Negative.   Endocrine: Negative.   Genitourinary: Negative.   Musculoskeletal: Negative.   Neurological: Negative.        Objective:   Physical Exam  Constitutional: She is oriented to person, place, and time. She appears well-developed and well-nourished.  HENT:  Head: Normocephalic.  Cardiovascular: Normal rate.  Pulmonary/Chest: Effort normal.  Neurological: She is alert and oriented to person, place, and time.  Psychiatric: She has a normal mood and affect. Her behavior is normal. Thought content normal.       Assessment & Plan:  S/P breast reconstruction, bilateral  We placed injectable saline in the Expander using a sterile technique: Right: 50 cc for a total of 350 / 455 cc

## 2018-04-08 ENCOUNTER — Encounter: Payer: Medicare Other | Admitting: Physical Therapy

## 2018-04-13 ENCOUNTER — Encounter: Payer: Medicare Other | Admitting: Physical Therapy

## 2018-04-15 ENCOUNTER — Ambulatory Visit: Payer: Medicare Other | Admitting: Physical Therapy

## 2018-04-19 ENCOUNTER — Encounter: Payer: Self-pay | Admitting: Physical Therapy

## 2018-04-19 ENCOUNTER — Ambulatory Visit: Payer: Medicare Other | Admitting: Physical Therapy

## 2018-04-19 DIAGNOSIS — M79645 Pain in left finger(s): Secondary | ICD-10-CM

## 2018-04-19 DIAGNOSIS — M6281 Muscle weakness (generalized): Secondary | ICD-10-CM

## 2018-04-19 DIAGNOSIS — M25521 Pain in right elbow: Secondary | ICD-10-CM | POA: Diagnosis not present

## 2018-04-19 NOTE — Therapy (Signed)
[facility] [address], [place], [place], [address] Phone: [phone]   Fax:  [phone]  Physical Therapy Treatment  Patient Details  Name: [name] MRN: [id number] Date of Birth: 03-08-1951 Referring Provider (PT): Edmonia Lynch   Encounter Date: 04/19/2018  PT End of Session - 04/19/18 1059    Visit Number  11    Date for PT Re-Evaluation  05/28/17    Authorization Type  MCR    PT Start Time  1100    PT Stop Time  1145    PT Time Calculation (min)  45 min    Activity Tolerance  Patient tolerated treatment well    Behavior During Therapy  [facility] for tasks assessed/performed       Past Medical History:  Diagnosis Date  . Anemia    "years ago"  . Arthritis    "left hand; was in my knees" (09/24/2017)  . Breast cancer, right breast ([place]) 2004  . Bruises easily   . Chronic lower back pain   . Ductal carcinoma in situ (DCIS) of left breast 2018  . Genetic testing 04/17/2017   STAT Breast panel with reflex to Multi-Cancer panel (83 genes) @ [facility] - No pathogenic mutations detected  . History of bronchitis    when she was young  . Hypertension    no longer taking medications as of August 2019  . Hypothyroidism    takes Synthroid daily  . Joint pain   . Joint swelling   . Personal history of chemotherapy 04/2003  . Personal history of radiation therapy 2005  . Pneumonia    hx of when she was young  . Right elbow tendonitis   . Sleep apnea    has a machine but does not use (09/24/2017)  . Thyroid cancer ([place]) 2012  . Urinary frequency     Past Surgical History:  Procedure Laterality Date  . ANTERIOR CERVICAL DECOMP/DISCECTOMY FUSION  2008  . APPENDECTOMY  2007  . BREAST BIOPSY Right 02/28/2003   malignant  . BREAST BIOPSY Left 03/2017  . BREAST LUMPECTOMY Right 2004  . BREAST RECONSTRUCTION WITH PLACEMENT OF TISSUE EXPANDER AND FLEX HD (ACELLULAR HYDRATED DERMIS) Bilateral 06/22/2017    Procedure: IMMEDIATE BILATERAL BREAST RECONSTRUCTION WITH PLACEMENT OF TISSUE EXPANDER AND FLEX HD (ACELLULAR HYDRATED DERMIS);  Surgeon: Wallace Going, DO;  Location: [place];  Service: Plastics;  Laterality: Bilateral;  . CESAREAN SECTION  1981  . COLONOSCOPY  X 2  . JOINT REPLACEMENT    . KNEE ARTHROSCOPY Right 2007  . KNEE ARTHROSCOPY  05/02/2011   Procedure: ARTHROSCOPY KNEE;  Surgeon: Johnn Hai;  Location: [place];  Service: Orthopedics;  Laterality: Right;  . LATISSIMUS FLAP TO BREAST Right 09/23/2017   Procedure: RIGHT BREAST LATISSIMUS DORSI FLAP RECONSTRUCTION WITH TISSUE EXPANDER;  Surgeon: Wallace Going, DO;  Location: [place];  Service: Plastics;  Laterality: Right;  . MASTECTOMY W/ SENTINEL NODE BIOPSY Bilateral 06/22/2017   Procedure: LEFT MASTECTOMY WITH SENTINEL LYMPH NODE BIOPSY AND RIGHT PROPHYLACTIC MASTECTOMY;  Surgeon: Jovita Kussmaul, MD;  Location: [place];  Service: General;  Laterality: Bilateral;  . MENISCUS DEBRIDEMENT  05/02/2011   Procedure: DEBRIDEMENT OF MENISCUS;  Surgeon: Johnn Hai;  Location: [place];  Service: Orthopedics;  Laterality: Right;  . PORTA CATH REMOVAL  2005  . PORTACATH PLACEMENT  2004  . REMOVAL OF TISSUE EXPANDER AND PLACEMENT OF IMPLANT Right  12/12/2017   Procedure: REMOVAL OF TISSUE EXPANDER, RIGHT;  Surgeon: Irene Limbo, MD;  Location: [place];  Service: Plastics;  Laterality: Right;  . THYROIDECTOMY N/A 09/22/2012   Procedure: RIGHT THYROID LOBECTOMY WITH FROZEN SECTION;  Surgeon: Izora Gala, MD;  Location: [place];  Service: ENT;  Laterality: N/A;  . THYROIDECTOMY Left 10/21/2012   Procedure: COMPLETION OF THYROIDECTOMY;  Surgeon: Izora Gala, MD;  Location: [place];  Service: ENT;  Laterality: Left;  . TISSUE EXPANDER PLACEMENT Right 03/03/2018   Procedure: TISSUE EXPANDER PLACEMENT;  Surgeon: Wallace Going, DO;  Location: [place];  Service: Plastics;  Laterality: Right;  . TOTAL KNEE ARTHROPLASTY  04/14/2012   Procedure: TOTAL KNEE ARTHROPLASTY;  Surgeon: Ninetta Lights, MD;  Location: [place];  Service: Orthopedics;  Laterality: Right;  RIGHT ARTHROPLASTY KNEE MEDIAL/LATERAL COMPARTMENTS WITH PATELLA RESURFACING  . TOTAL KNEE ARTHROPLASTY Left 03/30/2013   Procedure: TOTAL KNEE ARTHROPLASTY;  Surgeon: Ninetta Lights, MD;  Location: [place];  Service: Orthopedics;  Laterality: Left;    There were no vitals filed for this visit.  Subjective Assessment - 04/19/18 1100    Subjective  Lt thumb feels sore.  It is now more nagging pain than severe which is an improvement.  I have an appointment to see Dr. Percell Miller in Jan.  I watch what I do with my left hand as not to flare it up.  Haven't looked into the home parafin kit yet.    Pertinent History  Breast CA 15 yr ago, thryroid CA, DCIS (precancer);  ant cerv fusion 2008    Patient Stated Goals  get rid of pain    Currently in Pain?  Yes    Pain Score  7     Pain Location  Hand   Lt thumb   Pain Orientation  Left    Pain Descriptors / Indicators  Aching;Sore    Pain Type  Chronic pain    Pain Onset  More than a month ago    Pain Frequency  Constant                       [facility] Adult PT Treatment/Exercise - 04/19/18 0001      Exercises   Exercises  Hand      Hand Exercises   Other Hand Exercises  AROM left thumb: abd/add, flex/ext, opposition to each digit 1-4 x 5 reps each, 2 sets      Modalities   Modalities  Moist Heat      Moist Heat Therapy   Number Minutes Moist Heat  5 Minutes    Moist Heat Location  Hand   beginning of session     Cryotherapy   Number Minutes Cryotherapy  8 Minutes    Cryotherapy Location  Hand   end of session   Type of Cryotherapy  Ice pack      Manual Therapy   Joint Mobilization  CMC joint Lt thumb Gr II-IV distraction, glides for flexion/ext/abd/extension    Soft tissue mobilization  Lt thenar eminence     Other Manual Therapy  passive stretching/fanning palmar wrist for CMC extension               PT Short Term Goals - 04/06/18 0950      PT SHORT TERM GOAL #1   Title  Ind with initial HEP    Status  Achieved      PT SHORT TERM GOAL #2  Title  decreased pain in the R elbow by 50% with ADLS    Status  Achieved      PT SHORT TERM GOAL #3   Title  decreased pain in R hand by 50% with ADLS    Time  4    Period  Weeks    Status  On-going        PT Long Term Goals - 04/06/18 6270      PT LONG TERM GOAL #1   Title  Patient able to perform ADLs with R elbow and L hand pain of 2/10 or less    Time  6    Period  Weeks    Status  On-going    Target Date  05/28/18      PT LONG TERM GOAL #2   Title  Patient to demo 5/5 R wrist strength to ease ADLS    Status  Achieved      PT LONG TERM GOAL #3   Title  Pt independent and compliant in HEP to prevent further injury to her elbow and hand.    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  PT noted improved joint mobility and range with manual therapy today. Pt reported more comfort with AROM exercises after manual therapy but did report fatigue and soreness end of session.  Heat used beginning of session and ice at end.  Pt reports pulling pain across posterior CMC joint today with oppostion of Lt thumb to ring finger.  Pt will continue to benefit from skilled PT and ongoing assessment for Lt thumb along POC.    Rehab Potential  Excellent    PT Frequency  1x / week    PT  Duration  8 weeks    PT Treatment/Interventions  ADLs/Self Care Home Management;Cryotherapy;Electrical Stimulation;Iontophoresis 11m/ml Dexamethasone;Moist Heat;Ultrasound;Patient/family education;Therapeutic exercise;Manual techniques;Dry needling;Taping    PT Next Visit Plan  assess response to DN to left thenar musculature;  manual therapy; neural glides    PT Home Exercise Plan  Access Code: [email or web address]   Consulted and Agree with Plan of Care  Patient       Patient will benefit from skilled therapeutic intervention in order to improve the following deficits and impairments:  Pain, Decreased range of motion, Impaired flexibility, Decreased strength  Visit Diagnosis: Thumb pain, left  Muscle weakness (generalized)     Problem List Patient Active Problem List   Diagnosis Date Noted  . S/P breast reconstruction, bilateral 03/16/2018  . History of breast cancer in female 01/27/2018  . History of reconstruction of both breasts 01/27/2018  . Acquired absence of right breast 09/23/2017  . Breast cancer ([place] 06/22/2017  . 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  Genetic testing 04/17/2017  . DJD (degenerative joint disease) of knee 03/30/2013  . Unspecified sleep apnea 03/23/2013  . 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 Weakness 11/28/2011  . Hypertension 11/28/2011  . Hypokalemia 11/28/2011  . LUE weakness 11/28/2011  . Hypothyroidism 11/28/2011  . Thyroid cancer ([place]) 04/28/2010    Baruch Merl, PT 04/19/18 11:41 AM   [facility] [address] [place], [place], [address] Phone: [phone]   Fax:  [phone]  Name: FARYAL MARXEN MRN: [id number] Date of Birth: [birth year]

## 2018-04-20 ENCOUNTER — Encounter: Payer: Self-pay | Admitting: Plastic Surgery

## 2018-04-20 ENCOUNTER — Ambulatory Visit (INDEPENDENT_AMBULATORY_CARE_PROVIDER_SITE_OTHER): Payer: Medicare Other | Admitting: Plastic Surgery

## 2018-04-20 VITALS — BP 128/86 | Ht 64.0 in | Wt 194.0 lb

## 2018-04-20 DIAGNOSIS — Z9882 Breast implant status: Secondary | ICD-10-CM

## 2018-04-20 DIAGNOSIS — Z9889 Other specified postprocedural states: Secondary | ICD-10-CM

## 2018-04-20 DIAGNOSIS — Z9011 Acquired absence of right breast and nipple: Secondary | ICD-10-CM

## 2018-04-20 DIAGNOSIS — Z853 Personal history of malignant neoplasm of breast: Secondary | ICD-10-CM

## 2018-04-20 NOTE — Progress Notes (Signed)
   Subjective:    Patient ID: [name], female    DOB: 1950-12-07, 67 y.o.   MRN: [id number]  The patient is a 67 yrs old bf here for follow up on her bilateral breast reconstruction.  She has an expander on the left that is filled to 740 cc.  We are in the process of expander the right.  We are looking at exchanging to implant in January or February.  She is doing well overall.  There is no sign of seroma hematoma or infection.  The left inframammary fold has healed.   Review of Systems  Constitutional: Negative.   HENT: Negative.   Eyes: Negative.   Respiratory: Negative.   Gastrointestinal: Negative.   Genitourinary: Negative.   Musculoskeletal: Negative.   Skin: Negative.  Negative for color change and wound.       Objective:   Physical Exam Vitals signs and nursing note reviewed.  Constitutional:      Appearance: Normal appearance.  HENT:     Head: Normocephalic and atraumatic.  Eyes:     Pupils: Pupils are equal, round, and reactive to light.  Cardiovascular:     Rate and Rhythm: Normal rate.     Pulses: Normal pulses.  Abdominal:     General: Abdomen is flat.  Neurological:     General: No focal deficit present.     Mental Status: She is alert.  Psychiatric:        Mood and Affect: Mood normal.        Thought Content: Thought content normal.        Judgment: Judgment normal.        Assessment & Plan:  Acquired absence of right breast  History of breast cancer in female  History of reconstruction of both breasts The left breast has 740 cc in the 475 cc expander. The right breast expander we placed 50 cc today for a total of 400.  There was concern for a leak.  Will move ahead with replacement to implants. Implants ordered

## 2018-05-04 ENCOUNTER — Encounter: Payer: Self-pay | Admitting: Physician Assistant

## 2018-05-04 ENCOUNTER — Ambulatory Visit (INDEPENDENT_AMBULATORY_CARE_PROVIDER_SITE_OTHER): Payer: Managed Care, Other (non HMO) | Admitting: Physician Assistant

## 2018-05-04 VITALS — BP 147/90 | HR 75 | Temp 97.8°F | Ht 64.0 in | Wt 194.0 lb

## 2018-05-04 DIAGNOSIS — Z9889 Other specified postprocedural states: Secondary | ICD-10-CM

## 2018-05-04 DIAGNOSIS — Z853 Personal history of malignant neoplasm of breast: Secondary | ICD-10-CM

## 2018-05-04 NOTE — Progress Notes (Signed)
  Subjective:     Patient ID: [name], female   DOB: Oct 25, 1950, 68 y.o.   MRN: [id number]  HPI Very pleasant 68 year old African [place] female presents to the clinic s/p placement of right expander on 03/03/18.  Pt already had left expander in place.  At the last visit there was a concern that the expander on the right may have a leak but that does not appear to be the case.  Pt denies discomfort after last fill.    Review of Systems  Constitutional: Negative.   Respiratory: Negative.   Cardiovascular: Negative.   Gastrointestinal: Negative.   Skin: Negative.   Psychiatric/Behavioral: Negative.        Objective:   Physical Exam Constitutional:      Appearance: Normal appearance.  Pulmonary:     Effort: Pulmonary effort is normal.  Abdominal:     Palpations: Abdomen is soft.  Skin:    General: Skin is warm and dry.  Neurological:     Mental Status: She is alert and oriented to person, place, and time.  Psychiatric:        Mood and Affect: Mood normal.        Behavior: Behavior normal.        Thought Content: Thought content normal.        Judgment: Judgment normal.        Assessment:     Acquired Absence of b/l breast History of Breast cancer    Plan:     We placed injectable saline in the Expander using a sterile technique: Right: 50 cc for a total of 450 / 455 cc Left: 50 cc for a total of 790 / 475 cc  After discussion with the pt we will submit for insurance approval to move forward with the exchange to implants.  We are hope to be able to do end of January to beginning of February.

## 2018-05-06 ENCOUNTER — Ambulatory Visit: Payer: Medicare Other | Attending: Orthopedic Surgery | Admitting: Physical Therapy

## 2018-05-06 ENCOUNTER — Encounter: Payer: Self-pay | Admitting: Physical Therapy

## 2018-05-06 DIAGNOSIS — M6281 Muscle weakness (generalized): Secondary | ICD-10-CM | POA: Insufficient documentation

## 2018-05-06 DIAGNOSIS — M25521 Pain in right elbow: Secondary | ICD-10-CM | POA: Insufficient documentation

## 2018-05-06 DIAGNOSIS — M79645 Pain in left finger(s): Secondary | ICD-10-CM | POA: Insufficient documentation

## 2018-05-06 NOTE — Therapy (Addendum)
[facility] [address], [place], [place], [address] Phone: [phone]   Fax:  [phone]  Physical Therapy Treatment/Discharge Summary   Patient Details  Name: [name] MRN: [id number] Date of Birth: [birth year] Referring Provider (PT): Edmonia Lynch   Encounter Date: 05/06/2018  PT End of Session - 05/06/18 1010    Visit Number  12    Date for PT Re-Evaluation  05/28/18    Authorization Type  MCR    PT Start Time  0927    PT Stop Time  1012   DN and heat   PT Time Calculation (min)  45 min    Activity Tolerance  Patient tolerated treatment well       Past Medical History:  Diagnosis Date  . Anemia    "years ago"  . Arthritis    "left hand; was in my knees" (09/24/2017)  . Breast cancer, right breast ([place]) 2004  . Bruises easily   . Chronic lower back pain   . Ductal carcinoma in situ (DCIS) of left breast 2018  . Genetic testing 04/17/2017   STAT Breast panel with reflex to Multi-Cancer panel (83 genes) @ [facility] - No pathogenic mutations detected  . History of bronchitis    when she was young  . Hypertension    no longer taking medications as of August 2019  . Hypothyroidism    takes Synthroid daily  . Joint pain   . Joint swelling   . Personal history of chemotherapy 04/2003  . Personal history of radiation therapy 2005  . Pneumonia    hx of when she was young  . Right elbow tendonitis   . Sleep apnea    has a machine but does not use (09/24/2017)  . Thyroid cancer ([place]) 2012  . Urinary frequency     Past Surgical History:  Procedure Laterality Date  . ANTERIOR CERVICAL DECOMP/DISCECTOMY FUSION  2008  . APPENDECTOMY  2007  . BREAST BIOPSY Right 02/28/2003   malignant  . BREAST BIOPSY Left 03/2017  . BREAST LUMPECTOMY Right 2004  . BREAST RECONSTRUCTION WITH PLACEMENT OF TISSUE EXPANDER AND FLEX HD (ACELLULAR HYDRATED DERMIS) Bilateral 06/22/2017   Procedure: IMMEDIATE BILATERAL  BREAST RECONSTRUCTION WITH PLACEMENT OF TISSUE EXPANDER AND FLEX HD (ACELLULAR HYDRATED DERMIS);  Surgeon: Wallace Going, DO;  Location: [place];  Service: Plastics;  Laterality: Bilateral;  . CESAREAN SECTION  1981  . COLONOSCOPY  X 2  . JOINT REPLACEMENT    . KNEE ARTHROSCOPY Right 2007  . KNEE ARTHROSCOPY  05/02/2011   Procedure: ARTHROSCOPY KNEE;  Surgeon: Johnn Hai;  Location: [place];  Service: Orthopedics;  Laterality: Right;  . LATISSIMUS FLAP TO BREAST Right 09/23/2017   Procedure: RIGHT BREAST LATISSIMUS DORSI FLAP RECONSTRUCTION WITH TISSUE EXPANDER;  Surgeon: Wallace Going, DO;  Location: [place];  Service: Plastics;  Laterality: Right;  . MASTECTOMY W/ SENTINEL NODE BIOPSY Bilateral 06/22/2017   Procedure: LEFT MASTECTOMY WITH SENTINEL LYMPH NODE BIOPSY AND RIGHT PROPHYLACTIC MASTECTOMY;  Surgeon: Jovita Kussmaul, MD;  Location: [place];  Service: General;  Laterality: Bilateral;  . MENISCUS DEBRIDEMENT  05/02/2011   Procedure: DEBRIDEMENT OF MENISCUS;  Surgeon: Johnn Hai;  Location: [place];  Service: Orthopedics;  Laterality: Right;  . PORTA CATH REMOVAL  2005  . PORTACATH PLACEMENT  2004  . REMOVAL OF TISSUE EXPANDER AND PLACEMENT OF IMPLANT Right 12/12/2017   Procedure: REMOVAL  OF TISSUE EXPANDER, RIGHT;  Surgeon: Irene Limbo, MD;  Location: [place];  Service: Plastics;  Laterality: Right;  . THYROIDECTOMY N/A 09/22/2012   Procedure: RIGHT THYROID LOBECTOMY WITH FROZEN SECTION;  Surgeon: Izora Gala, MD;  Location: [place];  Service: ENT;  Laterality: N/A;  . THYROIDECTOMY Left 10/21/2012   Procedure: COMPLETION OF THYROIDECTOMY;  Surgeon: Izora Gala, MD;  Location: [place];  Service: ENT;  Laterality: Left;  . TISSUE EXPANDER PLACEMENT Right 03/03/2018   Procedure: TISSUE EXPANDER PLACEMENT;  Surgeon: Wallace Going, DO;  Location: [place];  Service: Plastics;   Laterality: Right;  . TOTAL KNEE ARTHROPLASTY  04/14/2012   Procedure: TOTAL KNEE ARTHROPLASTY;  Surgeon: Ninetta Lights, MD;  Location: [place];  Service: Orthopedics;  Laterality: Right;  RIGHT ARTHROPLASTY KNEE MEDIAL/LATERAL COMPARTMENTS WITH PATELLA RESURFACING  . TOTAL KNEE ARTHROPLASTY Left 03/30/2013   Procedure: TOTAL KNEE ARTHROPLASTY;  Surgeon: Ninetta Lights, MD;  Location: [place];  Service: Orthopedics;  Laterality: Left;    There were no vitals filed for this visit.  Subjective Assessment - 05/06/18 0930    Subjective  My elbow has been good but my left thumb.  The pain has been worse there.  It hurts to even touch the thumb (thenar eminec    Pertinent History  Breast CA 15 yr ago, thryroid CA, DCIS (precancer);  ant cerv fusion 2008    Currently in Pain?  Yes    Pain Score  7     Pain Location  Hand    Pain Orientation  Left    Pain Type  Chronic pain    Pain Relieving Factors  DN                       [facility] Adult PT Treatment/Exercise - 05/06/18 0001      Self-Care   Other Self-Care Comments   instruction in parameters for home paraffin wax use 1-2x/day 3 dips each       Moist Heat Therapy   Number Minutes Moist Heat  5 Minutes    Moist Heat Location  Hand;Elbow      Manual Therapy   Joint Mobilization  CMC joint Lt thumb Gr II/III to improve ext, abd    Soft tissue mobilization  left thumb massage to thenar eminence; left wrist extensors       Trigger Point Dry Needling - 05/06/18 1009    Consent Given?  Yes    Muscles Treated Upper Body  --   left abductor pollicis and brevis;  extensor carpi radialis            PT Short Term Goals - 04/06/18 0950      PT SHORT TERM GOAL #1   Title  Ind with initial HEP    Status  Achieved      PT SHORT TERM GOAL #2   Title  decreased pain in the R elbow by 50% with ADLS    Status  Achieved      PT SHORT TERM GOAL #3   Title  decreased pain in R hand by 50% with ADLS    Time  4    Period   Weeks    Status  On-going        PT Long Term Goals - 04/06/18 4097      PT LONG TERM GOAL #1   Title  Patient able to perform ADLs with R elbow and L hand pain of  2/10 or less    Time  6    Period  Weeks    Status  On-going    Target Date  05/28/18      PT LONG TERM GOAL #2   Title  Patient to demo 5/5 R wrist strength to ease ADLS    Status  Achieved      PT LONG TERM GOAL #3   Title  Pt independent and compliant in HEP to prevent further injury to her elbow and hand.    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  She reports good pain relief from extensive DN and manual therapy.  Discussed home paraffin use and parameters for home pain control.  Therapist closely monitoring response with treatment interventions.  She is slower to meet rehab goals secondary to the chronicity of the problem and numerous co-morbidities.  She sees the doctor next week.      Rehab Potential  Excellent    PT Frequency  1x / week    PT Treatment/Interventions  ADLs/Self Care Home Management;Cryotherapy;Electrical Stimulation;Iontophoresis 43m/ml Dexamethasone;Moist Heat;Ultrasound;Patient/family education;Therapeutic exercise;Manual techniques;Dry needling;Taping    PT Next Visit Plan   DN to left thenar musculature and ECRB;  manual therapy; neural glides    PT Home Exercise Plan  Access Code: [email or web address]      Patient will benefit from skilled therapeutic intervention in order to improve the following deficits and impairments:   Pain, Decreased range of motion, Impaired flexibility, Decreased strength  Visit Diagnosis: Thumb pain, left  Muscle weakness (generalized)  Pain in right elbow    PHYSICAL THERAPY DISCHARGE SUMMARY  Visits from Start of Care: 12  Current functional level related to goals / functional outcomes: The patient called to cancel her last scheduled appts secondary to having implant surgery.  Will discharge patient from PT at this time.  If further PT is needed and medically appropriate following her surgery, we would need a new PT order.     Remaining deficits: As above   Education / Equipment: HEP and self care  Plan: Patient agrees to discharge.  Patient goals were partially met. Patient is being discharged due to a change in medical status.  ?????          Problem List Patient Active Problem List   Diagnosis Date Noted  . S/P breast reconstruction, bilateral 03/16/2018  . History of breast cancer in female 01/27/2018  . History of reconstruction of both breasts 01/27/2018  . Acquired absence of right breast 09/23/2017  . Breast cancer ([place] 06/22/2017  . 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 Genetic testing 04/17/2017  . DJD (degenerative joint disease) of knee 03/30/2013  . Unspecified sleep apnea 03/23/2013  .  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 Weakness 11/28/2011  . Hypertension 11/28/2011  . Hypokalemia 11/28/2011  . LUE weakness 11/28/2011  . Hypothyroidism 11/28/2011  . Thyroid cancer ([place]) 04/28/2010   Ruben Im, PT 05/06/18 7:05 PM Phone: [phone] Fax: [phone] Alvera Singh 05/06/2018, 7:05 PM  [facility] [address] [place], [place], [address] Phone: [phone]   Fax:  [phone]  Name: [name] MRN: [id number] Date of Birth: 1950-09-15

## 2018-05-06 NOTE — Addendum Note (Signed)
Addended by: Alvera Singh on: 05/06/2018 07:01 PM   Modules accepted: Orders

## 2018-05-11 ENCOUNTER — Ambulatory Visit (INDEPENDENT_AMBULATORY_CARE_PROVIDER_SITE_OTHER): Payer: Managed Care, Other (non HMO) | Admitting: Physician Assistant

## 2018-05-11 ENCOUNTER — Encounter: Payer: Self-pay | Admitting: Physician Assistant

## 2018-05-11 VITALS — BP 129/83 | HR 74 | Ht 64.0 in | Wt 195.0 lb

## 2018-05-11 DIAGNOSIS — Z9889 Other specified postprocedural states: Secondary | ICD-10-CM

## 2018-05-11 DIAGNOSIS — Z853 Personal history of malignant neoplasm of breast: Secondary | ICD-10-CM

## 2018-05-11 NOTE — Progress Notes (Signed)
   Subjective:     Patient ID: [name], female   DOB: [birth year], 68 y.o.   MRN: [id number]  HPI  Very pleasant 68 year old African-American female presents to the clinic status post placement of right expander on 03/03/2018.  Patient presents the clinic today she is concerned about some erythema along the incision line scar of her right breast.  The patient has a history of an infection when she had to have the expander removed in the past.  We are very close to switching out this expander for the implant.  We are hopeful to have this scheduled by the end of the month to first part of next month.  Patient denies fevers chills night sweats. Review of Systems  Constitutional: Negative.   Respiratory: Negative.   Cardiovascular: Negative.   Gastrointestinal: Negative.   Skin: Positive for color change.  Psychiatric/Behavioral: Negative.        Objective:   Physical Exam Constitutional:      Appearance: Normal appearance.  Pulmonary:     Effort: Pulmonary effort is normal.  Abdominal:     Palpations: Abdomen is soft.  Skin:    General: Skin is warm and dry.  Neurological:     Mental Status: She is alert and oriented to person, place, and time.  Psychiatric:        Mood and Affect: Mood normal.        Behavior: Behavior normal.        Thought Content: Thought content normal.        Judgment: Judgment normal.   mild erythema noted along incision scar of the right breast     Assessment:     History of Breast Cancer    Plan:     Dr. Marla Roe did look at the erythema noted and di not think it required antibiotic We will proceed with getting surgery approved and scheduled Pt understands to contact us if it worsens

## 2018-05-11 NOTE — H&P (View-Only) (Signed)
   Subjective:     Patient ID: [name], female   DOB: 03/21/1951, 68 y.o.   MRN: [id number]  HPI  Very pleasant 68 year old African-American female presents to the clinic status post placement of right expander on 03/03/2018.  Patient presents the clinic today she is concerned about some erythema along the incision line scar of her right breast.  The patient has a history of an infection when she had to have the expander removed in the past.  We are very close to switching out this expander for the implant.  We are hopeful to have this scheduled by the end of the month to first part of next month.  Patient denies fevers chills night sweats. Review of Systems  Constitutional: Negative.   Respiratory: Negative.   Cardiovascular: Negative.   Gastrointestinal: Negative.   Skin: Positive for color change.  Psychiatric/Behavioral: Negative.        Objective:   Physical Exam Constitutional:      Appearance: Normal appearance.  Pulmonary:     Effort: Pulmonary effort is normal.  Abdominal:     Palpations: Abdomen is soft.  Skin:    General: Skin is warm and dry.  Neurological:     Mental Status: She is alert and oriented to person, place, and time.  Psychiatric:        Mood and Affect: Mood normal.        Behavior: Behavior normal.        Thought Content: Thought content normal.        Judgment: Judgment normal.   mild erythema noted along incision scar of the right breast     Assessment:     History of Breast Cancer    Plan:     Dr. Marla Roe did look at the erythema noted and di not think it required antibiotic We will proceed with getting surgery approved and scheduled Pt understands to contact us if it worsens

## 2018-05-12 ENCOUNTER — Ambulatory Visit: Payer: Medicare Other | Admitting: Physical Therapy

## 2018-05-18 ENCOUNTER — Ambulatory Visit: Payer: Managed Care, Other (non HMO) | Admitting: Plastic Surgery

## 2018-05-20 ENCOUNTER — Encounter: Payer: Managed Care, Other (non HMO) | Admitting: Physical Therapy

## 2018-05-21 ENCOUNTER — Encounter (HOSPITAL_BASED_OUTPATIENT_CLINIC_OR_DEPARTMENT_OTHER): Payer: Self-pay | Admitting: *Deleted

## 2018-05-21 ENCOUNTER — Other Ambulatory Visit: Payer: Self-pay

## 2018-05-24 ENCOUNTER — Encounter (HOSPITAL_BASED_OUTPATIENT_CLINIC_OR_DEPARTMENT_OTHER)
Admission: RE | Admit: 2018-05-24 | Discharge: 2018-05-24 | Disposition: A | Discharge status: Home / Self Care | Payer: Medicare Other | Source: Ambulatory Visit | Attending: Plastic Surgery | Admitting: Plastic Surgery

## 2018-05-24 DIAGNOSIS — Z79899 Other long term (current) drug therapy: Secondary | ICD-10-CM | POA: Diagnosis not present

## 2018-05-24 DIAGNOSIS — Z79890 Hormone replacement therapy (postmenopausal): Secondary | ICD-10-CM | POA: Diagnosis not present

## 2018-05-24 DIAGNOSIS — G4730 Sleep apnea, unspecified: Secondary | ICD-10-CM | POA: Diagnosis not present

## 2018-05-24 DIAGNOSIS — E039 Hypothyroidism, unspecified: Secondary | ICD-10-CM | POA: Diagnosis not present

## 2018-05-24 DIAGNOSIS — Z853 Personal history of malignant neoplasm of breast: Secondary | ICD-10-CM | POA: Diagnosis not present

## 2018-05-24 DIAGNOSIS — Z421 Encounter for breast reconstruction following mastectomy: Secondary | ICD-10-CM | POA: Diagnosis not present

## 2018-05-24 DIAGNOSIS — Z9013 Acquired absence of bilateral breasts and nipples: Secondary | ICD-10-CM | POA: Diagnosis not present

## 2018-05-24 DIAGNOSIS — I10 Essential (primary) hypertension: Secondary | ICD-10-CM | POA: Diagnosis not present

## 2018-05-24 LAB — BASIC METABOLIC PANEL
ANION GAP: 11 (ref 5–15)
BUN: 14 mg/dL (ref 8–23)
CALCIUM: 9.3 mg/dL (ref 8.9–10.3)
CO2: 26 mmol/L (ref 22–32)
Chloride: 102 mmol/L (ref 98–111)
Creatinine, Ser: 0.98 mg/dL (ref 0.44–1.00)
GFR calc Af Amer: >60 mL/min (ref >60)
GFR, EST NON AFRICAN AMERICAN: 60 mL/min — AB (ref >60)
GLUCOSE: 93 mg/dL (ref 70–99)
POTASSIUM: 3.4 mmol/L — AB (ref 3.5–5.1)
SODIUM: 139 mmol/L (ref 135–145)

## 2018-05-25 NOTE — Progress Notes (Signed)
3.4 K on 05/24/2018 reviewed with Dr Christella Hartigan. No new orders and ok to proceed with surgery as scheduled.

## 2018-05-26 ENCOUNTER — Encounter (HOSPITAL_BASED_OUTPATIENT_CLINIC_OR_DEPARTMENT_OTHER): Payer: Self-pay | Admitting: *Deleted

## 2018-05-26 ENCOUNTER — Other Ambulatory Visit: Payer: Self-pay

## 2018-05-26 ENCOUNTER — Ambulatory Visit (HOSPITAL_BASED_OUTPATIENT_CLINIC_OR_DEPARTMENT_OTHER)
Admission: RE | Admit: 2018-05-26 | Discharge: 2018-05-26 | Disposition: A | Discharge status: Home / Self Care | Payer: Medicare Other | Attending: Plastic Surgery | Admitting: Plastic Surgery

## 2018-05-26 ENCOUNTER — Ambulatory Visit (HOSPITAL_BASED_OUTPATIENT_CLINIC_OR_DEPARTMENT_OTHER): Payer: Medicare Other | Admitting: Certified Registered"

## 2018-05-26 ENCOUNTER — Telehealth: Payer: Self-pay | Admitting: Hematology and Oncology

## 2018-05-26 ENCOUNTER — Ambulatory Visit: Payer: Self-pay | Admitting: Physical Therapy

## 2018-05-26 ENCOUNTER — Encounter (HOSPITAL_BASED_OUTPATIENT_CLINIC_OR_DEPARTMENT_OTHER)
Admission: RE | Disposition: A | Discharge status: Home / Self Care | Payer: Self-pay | Source: Home / Self Care | Attending: Plastic Surgery

## 2018-05-26 DIAGNOSIS — G4730 Sleep apnea, unspecified: Secondary | ICD-10-CM | POA: Diagnosis not present

## 2018-05-26 DIAGNOSIS — Z421 Encounter for breast reconstruction following mastectomy: Secondary | ICD-10-CM | POA: Diagnosis not present

## 2018-05-26 DIAGNOSIS — Z9013 Acquired absence of bilateral breasts and nipples: Secondary | ICD-10-CM | POA: Insufficient documentation

## 2018-05-26 DIAGNOSIS — I10 Essential (primary) hypertension: Secondary | ICD-10-CM | POA: Insufficient documentation

## 2018-05-26 DIAGNOSIS — Z79890 Hormone replacement therapy (postmenopausal): Secondary | ICD-10-CM | POA: Insufficient documentation

## 2018-05-26 DIAGNOSIS — Z853 Personal history of malignant neoplasm of breast: Secondary | ICD-10-CM

## 2018-05-26 DIAGNOSIS — Z79899 Other long term (current) drug therapy: Secondary | ICD-10-CM | POA: Insufficient documentation

## 2018-05-26 DIAGNOSIS — E039 Hypothyroidism, unspecified: Secondary | ICD-10-CM | POA: Insufficient documentation

## 2018-05-26 HISTORY — PX: REMOVAL OF BILATERAL TISSUE EXPANDERS WITH PLACEMENT OF BILATERAL BREAST IMPLANTS: SHX6431

## 2018-05-26 SURGERY — REMOVAL, TISSUE EXPANDER, BREAST, BILATERAL, WITH BILATERAL IMPLANT IMPLANT INSERTION
Anesthesia: General | Site: Breast | Laterality: Bilateral

## 2018-05-26 MED ORDER — CEFAZOLIN SODIUM-DEXTROSE 2-4 GM/100ML-% IV SOLN
2.0000 g | INTRAVENOUS | Status: AC
  Administered 2018-05-26: 2 g via INTRAVENOUS

## 2018-05-26 MED ORDER — BUPIVACAINE-EPINEPHRINE 0.25% -1:200000 IJ SOLN
INTRAMUSCULAR | Status: AC
  Filled 2018-05-26: qty 1

## 2018-05-26 MED ORDER — DEXAMETHASONE SODIUM PHOSPHATE 4 MG/ML IJ SOLN
INTRAMUSCULAR | Status: DC | PRN
  Administered 2018-05-26: 4 mg via INTRAVENOUS

## 2018-05-26 MED ORDER — SUGAMMADEX SODIUM 200 MG/2ML IV SOLN
INTRAVENOUS | Status: AC
  Filled 2018-05-26: qty 2

## 2018-05-26 MED ORDER — SODIUM CHLORIDE 0.9 % IV SOLN
INTRAVENOUS | Status: AC
  Filled 2018-05-26 (×2): qty 500000

## 2018-05-26 MED ORDER — SUCCINYLCHOLINE CHLORIDE 200 MG/10ML IV SOSY
PREFILLED_SYRINGE | INTRAVENOUS | Status: AC
  Filled 2018-05-26: qty 10

## 2018-05-26 MED ORDER — FENTANYL CITRATE (PF) 100 MCG/2ML IJ SOLN
INTRAMUSCULAR | Status: AC
  Filled 2018-05-26: qty 2

## 2018-05-26 MED ORDER — CHLORHEXIDINE GLUCONATE CLOTH 2 % EX PADS: 6.0000 | MEDICATED_PAD | Freq: Once | CUTANEOUS | Status: DC

## 2018-05-26 MED ORDER — MIDAZOLAM HCL 2 MG/2ML IJ SOLN
INTRAMUSCULAR | Status: AC
  Filled 2018-05-26: qty 2

## 2018-05-26 MED ORDER — MIDAZOLAM HCL 5 MG/5ML IJ SOLN
INTRAMUSCULAR | Status: DC | PRN
  Administered 2018-05-26: 2 mg via INTRAVENOUS

## 2018-05-26 MED ORDER — LIDOCAINE 2% (20 MG/ML) 5 ML SYRINGE
INTRAMUSCULAR | Status: AC
  Filled 2018-05-26: qty 5

## 2018-05-26 MED ORDER — MIDAZOLAM HCL 2 MG/2ML IJ SOLN: 1.0000 mg | INTRAMUSCULAR | Status: DC | PRN

## 2018-05-26 MED ORDER — DEXAMETHASONE SODIUM PHOSPHATE 10 MG/ML IJ SOLN
INTRAMUSCULAR | Status: AC
  Filled 2018-05-26: qty 1

## 2018-05-26 MED ORDER — ROCURONIUM 10MG/ML (10ML) SYRINGE FOR MEDFUSION PUMP - OPTIME
INTRAVENOUS | Status: DC | PRN
  Administered 2018-05-26: 45 mg via INTRAVENOUS

## 2018-05-26 MED ORDER — ACETAMINOPHEN 650 MG RE SUPP: 650.0000 mg | RECTAL | Status: DC | PRN

## 2018-05-26 MED ORDER — ONDANSETRON HCL 4 MG/2ML IJ SOLN
INTRAMUSCULAR | Status: AC
  Filled 2018-05-26: qty 2

## 2018-05-26 MED ORDER — FENTANYL CITRATE (PF) 100 MCG/2ML IJ SOLN
INTRAMUSCULAR | Status: DC | PRN
  Administered 2018-05-26: 50 ug via INTRAVENOUS
  Administered 2018-05-26: 100 ug via INTRAVENOUS
  Administered 2018-05-26: 50 ug via INTRAVENOUS

## 2018-05-26 MED ORDER — SODIUM CHLORIDE 0.9% FLUSH: 3.0000 mL | INTRAVENOUS | Status: DC | PRN

## 2018-05-26 MED ORDER — PROPOFOL 10 MG/ML IV BOLUS
INTRAVENOUS | Status: DC | PRN
  Administered 2018-05-26: 80 mg via INTRAVENOUS
  Administered 2018-05-26: 40 mg via INTRAVENOUS

## 2018-05-26 MED ORDER — 0.9 % SODIUM CHLORIDE (POUR BTL) OPTIME
TOPICAL | Status: DC | PRN
  Administered 2018-05-26: 1000 mL

## 2018-05-26 MED ORDER — FENTANYL CITRATE (PF) 100 MCG/2ML IJ SOLN: 50.0000 ug | INTRAMUSCULAR | Status: DC | PRN

## 2018-05-26 MED ORDER — ROCURONIUM BROMIDE 50 MG/5ML IV SOSY
PREFILLED_SYRINGE | INTRAVENOUS | Status: AC
  Filled 2018-05-26: qty 5

## 2018-05-26 MED ORDER — LIDOCAINE 2% (20 MG/ML) 5 ML SYRINGE
INTRAMUSCULAR | Status: DC | PRN
  Administered 2018-05-26: 80 mg via INTRAVENOUS

## 2018-05-26 MED ORDER — SODIUM CHLORIDE 0.9% FLUSH: 3.0000 mL | Freq: Two times a day (BID) | INTRAVENOUS | Status: DC

## 2018-05-26 MED ORDER — SUGAMMADEX SODIUM 200 MG/2ML IV SOLN
INTRAVENOUS | Status: DC | PRN
  Administered 2018-05-26: 177.8 mg via INTRAVENOUS

## 2018-05-26 MED ORDER — FENTANYL CITRATE (PF) 100 MCG/2ML IJ SOLN: 25.0000 ug | INTRAMUSCULAR | Status: DC | PRN

## 2018-05-26 MED ORDER — PROPOFOL 10 MG/ML IV BOLUS
INTRAVENOUS | Status: AC
  Filled 2018-05-26: qty 20

## 2018-05-26 MED ORDER — EPHEDRINE 5 MG/ML INJ
INTRAVENOUS | Status: AC
  Filled 2018-05-26: qty 10

## 2018-05-26 MED ORDER — ONDANSETRON HCL 4 MG/2ML IJ SOLN
INTRAMUSCULAR | Status: DC | PRN
  Administered 2018-05-26: 4 mg via INTRAVENOUS

## 2018-05-26 MED ORDER — ACETAMINOPHEN 325 MG PO TABS: 650.0000 mg | ORAL_TABLET | ORAL | Status: DC | PRN

## 2018-05-26 MED ORDER — SCOPOLAMINE 1 MG/3DAYS TD PT72: 1.0000 | MEDICATED_PATCH | Freq: Once | TRANSDERMAL | Status: DC | PRN

## 2018-05-26 MED ORDER — EPHEDRINE SULFATE 50 MG/ML IJ SOLN
INTRAMUSCULAR | Status: DC | PRN
  Administered 2018-05-26: 10 mg via INTRAVENOUS

## 2018-05-26 MED ORDER — SODIUM CHLORIDE 0.9 % IV SOLN
INTRAVENOUS | Status: DC | PRN
  Administered 2018-05-26: 1000 mL

## 2018-05-26 MED ORDER — LACTATED RINGERS IV SOLN
INTRAVENOUS | Status: DC
  Administered 2018-05-26 (×2): via INTRAVENOUS

## 2018-05-26 MED ORDER — CEFAZOLIN SODIUM-DEXTROSE 2-4 GM/100ML-% IV SOLN
INTRAVENOUS | Status: AC
  Filled 2018-05-26: qty 100

## 2018-05-26 MED ORDER — OXYCODONE HCL 5 MG PO TABS: 5.0000 mg | ORAL_TABLET | ORAL | Status: DC | PRN

## 2018-05-26 MED ORDER — PHENYLEPHRINE HCL 10 MG/ML IJ SOLN
INTRAMUSCULAR | Status: DC | PRN
  Administered 2018-05-26 (×4): 80 ug via INTRAVENOUS

## 2018-05-26 MED ORDER — PHENYLEPHRINE 40 MCG/ML (10ML) SYRINGE FOR IV PUSH (FOR BLOOD PRESSURE SUPPORT)
PREFILLED_SYRINGE | INTRAVENOUS | Status: AC
  Filled 2018-05-26: qty 10

## 2018-05-26 MED ORDER — SODIUM CHLORIDE 0.9 % IV SOLN: 250.0000 mL | INTRAVENOUS | Status: DC | PRN

## 2018-05-26 MED ORDER — BUPIVACAINE-EPINEPHRINE 0.25% -1:200000 IJ SOLN
INTRAMUSCULAR | Status: DC | PRN
  Administered 2018-05-26: 4.5 mL

## 2018-05-26 SURGICAL SUPPLY — 75 items
ADH SKN CLS APL DERMABOND .7 (GAUZE/BANDAGES/DRESSINGS)
BAG DECANTER FOR FLEXI CONT (MISCELLANEOUS) ×3 IMPLANT
BINDER BREAST LRG (GAUZE/BANDAGES/DRESSINGS) IMPLANT
BINDER BREAST MEDIUM (GAUZE/BANDAGES/DRESSINGS) IMPLANT
BINDER BREAST XLRG (GAUZE/BANDAGES/DRESSINGS) IMPLANT
BINDER BREAST XXLRG (GAUZE/BANDAGES/DRESSINGS) ×1 IMPLANT
BIOPATCH RED 1 DISK 7.0 (GAUZE/BANDAGES/DRESSINGS) IMPLANT
BLADE HEX COATED 2.75 (ELECTRODE) ×2 IMPLANT
BLADE SURG 15 STRL LF DISP TIS (BLADE) ×2 IMPLANT
BLADE SURG 15 STRL SS (BLADE) ×4
BNDG GAUZE ELAST 4 BULKY (GAUZE/BANDAGES/DRESSINGS) ×2 IMPLANT
CANISTER SUCT 1200ML W/VALVE (MISCELLANEOUS) ×3 IMPLANT
CHLORAPREP W/TINT 26ML (MISCELLANEOUS) ×2 IMPLANT
CORD BIPOLAR FORCEPS 12FT (ELECTRODE) IMPLANT
COVER BACK TABLE 60X90IN (DRAPES) ×2 IMPLANT
COVER MAYO STAND STRL (DRAPES) ×2 IMPLANT
COVER WAND RF STERILE (DRAPES) IMPLANT
DECANTER SPIKE VIAL GLASS SM (MISCELLANEOUS) IMPLANT
DERMABOND ADVANCED (GAUZE/BANDAGES/DRESSINGS)
DERMABOND ADVANCED .7 DNX12 (GAUZE/BANDAGES/DRESSINGS) IMPLANT
DRAIN CHANNEL 19F RND (DRAIN) IMPLANT
DRAPE IMP U-DRAPE 54X76 (DRAPES) ×1 IMPLANT
DRAPE LAPAROSCOPIC ABDOMINAL (DRAPES) ×2 IMPLANT
DRSG PAD ABDOMINAL 8X10 ST (GAUZE/BANDAGES/DRESSINGS) ×4 IMPLANT
ELECT BLADE 4.0 EZ CLEAN MEGAD (MISCELLANEOUS) ×2
ELECT REM PT RETURN 9FT ADLT (ELECTROSURGICAL) ×2
ELECTRODE BLDE 4.0 EZ CLN MEGD (MISCELLANEOUS) ×1 IMPLANT
ELECTRODE REM PT RTRN 9FT ADLT (ELECTROSURGICAL) ×1 IMPLANT
EVACUATOR SILICONE 100CC (DRAIN) IMPLANT
GAUZE SPONGE 4X4 12PLY STRL LF (GAUZE/BANDAGES/DRESSINGS) IMPLANT
GLOVE BIO SURGEON STRL SZ 6.5 (GLOVE) ×10 IMPLANT
GLOVE BIO SURGEON STRL SZ7.5 (GLOVE) ×1 IMPLANT
GLOVE BIOGEL PI IND STRL 8 (GLOVE) IMPLANT
GLOVE BIOGEL PI INDICATOR 8 (GLOVE) ×1
GOWN STRL REUS W/ TWL LRG LVL3 (GOWN DISPOSABLE) ×2 IMPLANT
GOWN STRL REUS W/ TWL XL LVL3 (GOWN DISPOSABLE) IMPLANT
GOWN STRL REUS W/TWL LRG LVL3 (GOWN DISPOSABLE) ×6
GOWN STRL REUS W/TWL XL LVL3 (GOWN DISPOSABLE) ×2
IMPL BREAST SILICONE 700CC (Breast) IMPLANT
IMPL GEL 450CC (Breast) IMPLANT
IMPLANT BREAST SILICONE 700CC (Breast) ×2 IMPLANT
IMPLANT GEL 450CC (Breast) ×2 IMPLANT
IV NS 1000ML (IV SOLUTION)
IV NS 1000ML BAXH (IV SOLUTION) IMPLANT
IV NS 500ML (IV SOLUTION)
IV NS 500ML BAXH (IV SOLUTION) IMPLANT
KIT FILL SYSTEM UNIVERSAL (SET/KITS/TRAYS/PACK) IMPLANT
NDL HYPO 25X1 1.5 SAFETY (NEEDLE) IMPLANT
NDL SAFETY ECLIPSE 18X1.5 (NEEDLE) ×1 IMPLANT
NEEDLE HYPO 18GX1.5 SHARP (NEEDLE) ×2
NEEDLE HYPO 25X1 1.5 SAFETY (NEEDLE) ×2 IMPLANT
PACK BASIN DAY SURGERY FS (CUSTOM PROCEDURE TRAY) ×2 IMPLANT
PENCIL BUTTON HOLSTER BLD 10FT (ELECTRODE) ×2 IMPLANT
PIN SAFETY STERILE (MISCELLANEOUS) IMPLANT
SIZER BREAST 450CC (SIZER) ×2
SIZER BREAST GEL HP 700CC (SIZER) ×2
SIZER BRST GEL HP 700CC (SIZER) IMPLANT
SIZER BRST P5.1XHI 450CC (SIZER) IMPLANT
SLEEVE SCD COMPRESS KNEE MED (MISCELLANEOUS) ×2 IMPLANT
SPONGE LAP 18X18 RF (DISPOSABLE) ×4 IMPLANT
STRIP SUTURE WOUND CLOSURE 1/2 (SUTURE) IMPLANT
SUT MNCRL AB 4-0 PS2 18 (SUTURE) ×5 IMPLANT
SUT MON AB 3-0 SH 27 (SUTURE) ×6
SUT MON AB 3-0 SH27 (SUTURE) ×4 IMPLANT
SUT MON AB 5-0 PS2 18 (SUTURE) ×3 IMPLANT
SUT PDS AB 2-0 CT2 27 (SUTURE) IMPLANT
SUT VIC AB 3-0 SH 27 (SUTURE)
SUT VIC AB 3-0 SH 27X BRD (SUTURE) IMPLANT
SUT VICRYL 4-0 PS2 18IN ABS (SUTURE) IMPLANT
SYR BULB IRRIGATION 50ML (SYRINGE) ×2 IMPLANT
SYR CONTROL 10ML LL (SYRINGE) IMPLANT
TOWEL GREEN STERILE FF (TOWEL DISPOSABLE) ×4 IMPLANT
TUBE CONNECTING 20X1/4 (TUBING) ×2 IMPLANT
UNDERPAD 30X30 (UNDERPADS AND DIAPERS) ×4 IMPLANT
YANKAUER SUCT BULB TIP NO VENT (SUCTIONS) ×2 IMPLANT

## 2018-05-26 NOTE — Telephone Encounter (Signed)
patient called to reschedule °

## 2018-05-26 NOTE — Anesthesia Preprocedure Evaluation (Addendum)
Anesthesia Evaluation  Patient identified by MRN, date of birth, ID band Patient awake    Reviewed: Allergy & Precautions, NPO status , Patient's Chart, lab work & pertinent test results  Airway Mallampati: III  TM Distance: >3 FB Neck ROM: Full    Dental no notable dental hx. (+) Teeth Intact, Dental Advisory Given   Pulmonary sleep apnea (does not use CPAP) ,    Pulmonary exam normal breath sounds clear to auscultation       Cardiovascular hypertension, negative cardio ROS Normal cardiovascular exam Rhythm:Regular Rate:Normal  TTE 2016 EF 55-60%, no valvular abnormalities, grade 1 DD   Neuro/Psych negative neurological ROS  negative psych ROS   GI/Hepatic negative GI ROS, Neg liver ROS,   Endo/Other  Hypothyroidism   Renal/GU negative Renal ROS  negative genitourinary   Musculoskeletal  (+) Arthritis ,   Abdominal   Peds  Hematology negative hematology ROS (+)   Anesthesia Other Findings H/o breast cancer, s/p mastectomy  Reproductive/Obstetrics                            Anesthesia Physical Anesthesia Plan  ASA: II  Anesthesia Plan: General   Post-op Pain Management:    Induction: Intravenous  PONV Risk Score and Plan: 3 and Dexamethasone, Treatment may vary due to age or medical condition, Ondansetron and Midazolam  Airway Management Planned: Oral ETT  Additional Equipment:   Intra-op Plan:   Post-operative Plan: Extubation in OR  Informed Consent: I have reviewed the patients History and Physical, chart, labs and discussed the procedure including the risks, benefits and alternatives for the proposed anesthesia with the patient or authorized representative who has indicated his/her understanding and acceptance.     Dental advisory given  Plan Discussed with: CRNA  Anesthesia Plan Comments:         Anesthesia Quick Evaluation

## 2018-05-26 NOTE — Discharge Instructions (Signed)
INSTRUCTIONS FOR AFTER BREAST SURGERY ° ° °You are getting ready to undergo breast surgery.  You will likely have some questions about what to expect following your operation.  The following information will help you and your family understand what to expect when you are discharged from the hospital.  Following these guidelines will help ensure a smooth recovery and reduce risks of complications.   °Postoperative instructions include information on: diet, wound care, medications and physical activity. ° °AFTER SURGERY °Expect to go home after the procedure.  In some cases, you may need to spend one night in the hospital for observation. ° °DIET °Breast surgery does not require a specific diet.  However, I have to mention that the healthier you eat the better your body can start healing. It is important to increasing your protein intake.  This means limiting the foods with sugar and carbohydrates.  Focus on vegetables and some meat.  If you have any liposuction during your procedure be sure to drink water.  If your urine is bright yellow, then it is concentrated, and you need to drink more water.  As a general rule after surgery, you should have 8 ounces of water every hour while awake.  If you find you are persistently nauseated or unable to take in liquids let us know.  NO TOBACCO USE or EXPOSURE.  This will slow your healing process and increase the risk of a wound. ° °WOUND CARE °You can shower the day after surgery if you don't have a drain.  Use fragrance free soap.  Dial, Dove and Ivory are usually mild on the skin. If you have a drain clean with baby wipes until the drain is removed.  If you have steri-strips / tape directly attached to your skin leave them in place. It is OK to get these wet.  No baths, pools or hot tubs for two weeks. °We close your incision to leave the smallest and best-looking scar. No ointment or creams on your incisions until given the go ahead.  Especially not Neosporin (Too many skin  reactions with this one).  A few weeks after surgery you can use Mederma and start massaging the scar. °We ask you to wear your binder or sports bra for the first 6 weeks around the clock, including while sleeping. This provides added comfort and helps reduce the fluid accumulation at the surgery site. ° °ACTIVITY °No heavy lifting until cleared by the doctor.  This usually means no more than a half-gallon of milk.  It is OK to walk and climb stairs. In fact, moving your legs is very important to decrease your risk of a blood clot.  It will also help keep you from getting deconditioned.  Every 1 to 2 hours get up and walk for 5 minutes. This will help with a quicker recovery back to normal.  Let pain be your guide so you don't do too much.  NO, you cannot do the spring cleaning and don't plan on taking care of anyone else.  This is your time for TLC.  °You will be more comfortable if you sleep and rest with your head elevated either with a few pillows under you or in a recliner.  No stomach sleeping for a few months. ° °WORK °Everyone returns to work at different times. As a rough guide, most people take at least 1 - 2 weeks off prior to returning to work. If you need documentation for your job, bring the forms to your postoperative follow   up visit.  DRIVING Arrange for someone to bring you home from the hospital.  You may be able to drive a few days after surgery but not while taking any narcotics or valium.  BOWEL MOVEMENTS Constipation can occur after anesthesia and while taking pain medication.  It is important to stay ahead for your comfort.  We recommend taking Milk of Magnesia (2 tablespoons; twice a day) while taking the pain pills.  SEROMA This is fluid your body tried to put in the surgical site.  This is normal but if it creates tight skinny skin let us know.  It usually decreases in a few weeks.  WHEN TO CALL Call your surgeon's office if any of the following occur:  Fever 101 degrees F or  greater  Excessive bleeding or fluid from the incision site.  Pain that increases over time without aid from the medications  Redness, warmth, or pus draining from incision sites  Persistent nausea or inability to take in liquids  Severe misshapen area that underwent the operation.  Here are some resources:  1. Plastic surgery website: https://www.plasticsurgery.org/for-medical-professionals/education-and-resources/publications/breast-reconstruction-magazine 2. Breast Reconstruction Awareness Campaign:  [employer]co[employer]nz 3. Plastic surgery Implant information:  https://www.plasticsurgery.org/patient-safety/breast-implant-safety       Post Anesthesia Home Care Instructions  Activity: Get plenty of rest for the remainder of the day. A responsible individual must stay with you for 24 hours following the procedure.  For the next 24 hours, DO NOT: -Drive a car -Paediatric nurse -Drink alcoholic beverages -Take any medication unless instructed by your physician -Make any legal decisions or sign important papers.  Meals: Start with liquid foods such as gelatin or soup. Progress to regular foods as tolerated. Avoid greasy, spicy, heavy foods. If nausea and/or vomiting occur, drink only clear liquids until the nausea and/or vomiting subsides. Call your physician if vomiting continues.  Special Instructions/Symptoms: Your throat may feel dry or sore from the anesthesia or the breathing tube placed in your throat during surgery. If this causes discomfort, gargle with warm salt water. The discomfort should disappear within 24 hours.  If you had a scopolamine patch placed behind your ear for the management of post- operative nausea and/or vomiting:  1. The medication in the patch is effective for 72 hours, after which it should be removed.  Wrap patch in a tissue and discard in the trash. Wash hands thoroughly with soap and water. 2. You may remove the patch earlier than  72 hours if you experience unpleasant side effects which may include dry mouth, dizziness or visual disturbances. 3. Avoid touching the patch. Wash your hands with soap and water after contact with the patch.

## 2018-05-26 NOTE — Transfer of Care (Signed)
Immediate Anesthesia Transfer of Care Note  Patient: [name]  Procedure(s) Performed: REMOVAL OF BILATERAL TISSUE EXPANDERS WITH PLACEMENT OF BILATERAL BREAST IMPLANTS (Bilateral Breast)  Patient Location: PACU  Anesthesia Type:General  Level of Consciousness: awake, alert  and oriented  Airway & Oxygen Therapy: Patient Spontanous Breathing and Patient connected to face mask oxygen  Post-op Assessment: Report given to RN and Post -op Vital signs reviewed and stable  Post vital signs: Reviewed and stable  Last Vitals:  Vitals Value Taken Time  BP    Temp    Pulse    Resp    SpO2      Last Pain:  Vitals:   05/26/18 0837  TempSrc: Oral  PainSc: 0-No pain         Complications: No apparent anesthesia complications

## 2018-05-26 NOTE — Op Note (Signed)
Op report Bilateral Exchange   DATE OF OPERATION: 05/26/2018  LOCATION: [facility]  SURGICAL DIVISION: Plastic Surgery  PREOPERATIVE DIAGNOSES:  1.History of breast cancer.  2. Acquired absence of bilateral breast.   POSTOPERATIVE DIAGNOSES:  1. History of breast cancer.  2. Acquired absence of bilateral breast.   PROCEDURE:  1. Bilateral exchange of tissue expanders for implants.  2. Bilateral capsulotomies for implant respositioning.  SURGEON: Alphonsine Minium Sanger Olivea Sonnen, DO  ASSISTANT: Ronette Deter, PA  ANESTHESIA:  General.   COMPLICATIONS: None.   IMPLANTS: Left - Mentor Smooth Round High Profile Gel 700cc. Ref #[id number].  Serial Number [id number] Right - [employer]or Smooth Round High Profile Gel 450cc. Ref #[id number].  Serial Number [id number]  INDICATIONS FOR PROCEDURE:  The patient, [name], is a 68 y.o. female born on [birth year], is here for treatment after bilateral mastectomies.  She had tissue expanders placed at the time of mastectomies. She now presents for exchange of her expanders for implants.  She requires capsulotomies to better position the implants. A right latissimus was performed due to skin breakdown on the right side. MRN: [id number]  CONSENT:  Informed consent was obtained directly from the patient. Risks, benefits and alternatives were fully discussed. Specific risks including but not limited to bleeding, infection, hematoma, seroma, scarring, pain, implant infection, implant extrusion, capsular contracture, asymmetry, wound healing problems, and need for further surgery were all discussed. The patient did have an ample opportunity to have her questions answered to her satisfaction.   DESCRIPTION OF PROCEDURE:  The patient was taken to the operating room. SCDs were placed and IV antibiotics were given. The patient's chest was prepped and draped in a sterile fashion. A time out was performed and the implants to be used were  identified.    On the right breast: One percent Lidocaine with epinephrine was used to infiltrate at the incision site. The inferior latissimus scar was incised.  The flap was raised inferiorly but not lifted superiorly at all to protect the flap.  The dissection was carried to the capsule.  The capsule was opened and the fluid removed from the expander.  The expander was removed.  Inspection of the pocket showed a normal and thick capsule.  The pocket was irrigated with antibiotic solution.  Circumferential capsulotomies were performed to allow for breast pocket expansion.  Measurements were made and a sizer used to confirm adequate pocket size for the implant dimensions.  Hemostasis was ensured with electrocautery. New gloves were placed. The implant was soaked in antibiotic solution and then placed in the pocket and oriented appropriately. The capsule and latissimus were closed with a 3-0 Monocryl suture. The remaining soft tissue was closed with 4-0 Monocryl and 5-0 Monocryl subcuticular stitches.   On the left breast: The old mastectomy scar was incised.  The mastectomy flaps from the superior and inferior flaps were raised over the pectoralis major muscle for several centimeters to minimize tension for the closure. The pectoralis was split inferior to the skin incision to expose and remove the tissue expander.  Inspection of the pocket showed a normal healthy capsule and good integration of the biologic matrix.  Circumferential capsulotomies were performed to allow for breast pocket expansion.  Measurements were made and a sizer utilized to confirm adequate pocket size for the implant dimensions.  Hemostasis was ensured with the electrocautery.  New gloves were applied. The implant was soaked in antibiotic solution and placed in the pocket and oriented appropriately. The pectoralis major muscle  and capsule on the anterior surface were re-closed with a 3-0 Monocryl suture. The remaining skin was closed with  4-0 Monocryl deep dermal and 5-0 Monocryl subcuticular stitches.  Dermabond was applied to the incision site. A breast binder and ABDs were placed.  The patient was allowed to wake from anesthesia and taken to the recovery room in satisfactory condition.

## 2018-05-26 NOTE — Interval H&P Note (Signed)
History and Physical Interval Note:  05/26/2018 9:16 AM  [name]  has presented today for surgery, with the diagnosis of Acquired Absence of b/l breast History of Breast cancer  The various methods of treatment have been discussed with the patient and family. After consideration of risks, benefits and other options for treatment, the patient has consented to  Procedure(s) with comments: REMOVAL OF BILATERAL TISSUE EXPANDERS WITH PLACEMENT OF BILATERAL BREAST IMPLANTS (Bilateral) - please adjust case to reflect 105 min. Thank you! as a surgical intervention .  The patient's history has been reviewed, patient examined, no change in status, stable for surgery.  I have reviewed the patient's chart and labs.  Questions were answered to the patient's satisfaction.     [name]

## 2018-05-26 NOTE — Anesthesia Procedure Notes (Signed)
Procedure Name: Intubation Date/Time: 05/26/2018 8:51 AM Performed by: Jonna Munro, CRNA Pre-anesthesia Checklist: Patient identified, Emergency Drugs available, Suction available, Patient being monitored and Timeout performed Patient Re-evaluated:Patient Re-evaluated prior to induction Oxygen Delivery Method: Circle system utilized Preoxygenation: Pre-oxygenation with 100% oxygen Induction Type: IV induction Ventilation: Mask ventilation without difficulty Laryngoscope Size: Mac and 3 Grade View: Grade I Tube type: Oral Tube size: 7.0 mm Number of attempts: 2 Airway Equipment and Method: Stylet Placement Confirmation: ETT inserted through vocal cords under direct vision,  positive ETCO2 and breath sounds checked- equal and bilateral Secured at: 22 cm Tube secured with: Tape Dental Injury: Teeth and Oropharynx as per pre-operative assessment  Comments: DL tube placed, tube retracted when blade removed, DL again, ETT placed without difficulty, no tissue or dental damage, VSS.

## 2018-05-26 NOTE — Anesthesia Postprocedure Evaluation (Signed)
Anesthesia Post Note  Patient: NAKOTA ACKERT  Procedure(s) Performed: REMOVAL OF BILATERAL TISSUE EXPANDERS WITH PLACEMENT OF BILATERAL BREAST IMPLANTS (Bilateral Breast)     Patient location during evaluation: PACU Anesthesia Type: General Level of consciousness: awake and alert Pain management: pain level controlled Vital Signs Assessment: post-procedure vital signs reviewed and stable Respiratory status: spontaneous breathing, nonlabored ventilation, respiratory function stable and patient connected to nasal cannula oxygen Cardiovascular status: blood pressure returned to baseline and stable Postop Assessment: no apparent nausea or vomiting Anesthetic complications: no    Last Vitals:  Vitals:   05/26/18 1230 05/26/18 1245  BP: (!) 155/79   Pulse: 92 89  Resp: 20 15  Temp:    SpO2: 96% 94%    Last Pain:  Vitals:   05/26/18 1230  TempSrc:   PainSc: 0-No pain                 Montez Hageman

## 2018-05-27 ENCOUNTER — Encounter (HOSPITAL_BASED_OUTPATIENT_CLINIC_OR_DEPARTMENT_OTHER): Payer: Self-pay | Admitting: Plastic Surgery

## 2018-06-04 ENCOUNTER — Ambulatory Visit (INDEPENDENT_AMBULATORY_CARE_PROVIDER_SITE_OTHER): Payer: Managed Care, Other (non HMO) | Admitting: Plastic Surgery

## 2018-06-04 ENCOUNTER — Encounter: Payer: Self-pay | Admitting: Plastic Surgery

## 2018-06-04 VITALS — BP 121/83 | HR 82 | Temp 97.8°F | Ht 64.0 in | Wt 195.0 lb

## 2018-06-04 DIAGNOSIS — Z9882 Breast implant status: Secondary | ICD-10-CM

## 2018-06-04 DIAGNOSIS — Z9889 Other specified postprocedural states: Secondary | ICD-10-CM

## 2018-06-04 NOTE — Progress Notes (Signed)
   Subjective:    Patient ID: [name], female    DOB: 01-Jan-1951, 68 y.o.   MRN: [id number]  [name] is a 68 year old female here for follow-up on her bilateral breast reconstruction.  She had her expanders removed last week.  Implants were placed.  She has no sign of infection, hematoma or seroma.  She feels good and has not had any complications.  Her incisions are intact.     Review of Systems  Constitutional: Negative.   HENT: Negative.   Eyes: Negative.   Respiratory: Negative.   Cardiovascular: Negative.   Gastrointestinal: Negative.   Musculoskeletal: Negative.        Objective:   Physical Exam Vitals signs and nursing note reviewed.  Constitutional:      Appearance: Normal appearance.  HENT:     Head: Normocephalic.     Mouth/Throat:     Mouth: Mucous membranes are moist.  Cardiovascular:     Rate and Rhythm: Normal rate.  Pulmonary:     Effort: Pulmonary effort is normal.  Abdominal:     General: Abdomen is flat.  Neurological:     Mental Status: She is alert.  Psychiatric:        Mood and Affect: Mood normal.        Thought Content: Thought content normal.        Judgment: Judgment normal.       Assessment & Plan:  S/P breast reconstruction, bilateral  History of reconstruction of both breasts  Follow-up in 2 weeks and we can start making plans for the nipple areolar reconstruction with tattoo. She also has a split left earlobe that she may want to have repaired at a later time.

## 2018-06-07 NOTE — Progress Notes (Signed)
Dr. Marla Roe,   The route for this patient did not come through..can you send again. I am not seeing any true surgery routing from [facility], so I'm not sure if it is blocked in the note and not able to be viewed.

## 2018-06-18 ENCOUNTER — Encounter: Payer: Self-pay | Admitting: Plastic Surgery

## 2018-06-18 ENCOUNTER — Ambulatory Visit (INDEPENDENT_AMBULATORY_CARE_PROVIDER_SITE_OTHER): Payer: Managed Care, Other (non HMO) | Admitting: Plastic Surgery

## 2018-06-18 VITALS — BP 125/83 | HR 68 | Temp 97.8°F | Ht 64.0 in | Wt 195.0 lb

## 2018-06-18 DIAGNOSIS — Z9889 Other specified postprocedural states: Secondary | ICD-10-CM

## 2018-06-18 DIAGNOSIS — Z853 Personal history of malignant neoplasm of breast: Secondary | ICD-10-CM

## 2018-06-18 DIAGNOSIS — Z9882 Breast implant status: Secondary | ICD-10-CM

## 2018-06-18 NOTE — Progress Notes (Signed)
Patient ID: [name], female   DOB: 1950-09-17, 68 y.o.   MRN: [id number]  The patient has a left earlobe that is torn.  She is interested in having it repaired.  We will send her a quote and if she agrees we can get her scheduled.

## 2018-06-18 NOTE — Progress Notes (Addendum)
   Subjective:    Patient ID: [name], female    DOB: May 04, 1950, 68 y.o.   MRN: [id number]  The patient is a 68 year old female here for follow-up on her bilateral breast reconstruction.  She underwent a latissimus muscle flap to the right side and expander to implant on the left.  She is very pleased with her progress.  She is back to work a couple days a week.  She does not have any pain.  The incisions are healing well.  She does not have any signs of a seroma or infection.   Review of Systems  Constitutional: Negative.   HENT: Negative.   Eyes: Negative.   Respiratory: Negative.   Cardiovascular: Negative.   Genitourinary: Negative.   Musculoskeletal: Negative.   Skin: Negative.        Objective:   Physical Exam Vitals signs and nursing note reviewed.  HENT:     Head: Normocephalic and atraumatic.  Cardiovascular:     Rate and Rhythm: Normal rate.  Pulmonary:     Effort: Pulmonary effort is normal.  Neurological:     General: No focal deficit present.     Mental Status: She is alert.  Psychiatric:        Mood and Affect: Mood normal.        Thought Content: Thought content normal.        Judgment: Judgment normal.       Assessment & Plan:  S/P breast reconstruction, bilateral  History of reconstruction of both breasts  History of breast cancer in female Follow-up in 2 months.  We will then discuss nipple areole a reconstruction.  In the meantime she can continue to increase her activity.

## 2018-06-30 ENCOUNTER — Ambulatory Visit: Payer: Managed Care, Other (non HMO) | Admitting: Hematology and Oncology

## 2018-07-06 NOTE — Progress Notes (Signed)
Patient Care Team: Yvonne Lei, MD as PCP - General (Family Medicine)  DIAGNOSIS:    ICD-10-CM   1. Ductal carcinoma in situ (DCIS) of left breast D05.12     SUMMARY OF ONCOLOGIC HISTORY:   Thyroid cancer ([place])   04/28/2010 Initial Diagnosis    Thyroid cancer ([place])    04/29/2017 Genetic Testing    STAT Breast panel with reflex to Multi-Cancer panel (83 genes) @ [facility] - No pathogenic mutations detected Variant of Uncertain Significance in MUTYH  Genes Analyzed: 83 genes on [facility]'s Multi-Cancer panel (ALK, APC, ATM, AXIN2, BAP1, BARD1, BLM, BMPR1A, BRCA1, BRCA2, BRIP1, CASR, CDC73, CDH1, CDK4, CDKN1B, CDKN1C, CDKN2A, CEBPA, CHEK2, CTNNA1, DICER1, DIS3L2, EGFR, EPCAM, FH, FLCN, GATA2, GPC3, GREM1, HOXB13, HRAS, KIT, MAX, MEN1, MET, MITF, MLH1, MSH2, MSH3, MSH6, MUTYH, NBN, NF1, NF2, NTHL1, PALB2, PDGFRA, PHOX2B, PMS2, POLD1, POLE, POT1, PRKAR1A, PTCH1, PTEN, RAD50, RAD51C, RAD51D, RB1, RECQL4, RET, RUNX1, SDHA, SDHAF2, SDHB, SDHC, SDHD, SMAD4, SMARCA4, SMARCB1, SMARCE1, STK11, SUFU, TERC, TERT, TMEM127, TP53, TSC1, TSC2, VHL, WRN, WT1).     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    04/09/2017 Initial Diagnosis    Multiple grouped pleomorphic and coarse heterogeneous calcifications spanning 5 cm. Two biopsies revealed DCIS with necrosis ER 100%, PR 100% on the first biopsy, ER 95%, PR 90% on second biopsy, Tis Nx stage 0    04/29/2017 Genetic Testing    STAT Breast panel with reflex to Multi-Cancer panel (83 genes) @ [facility] - No pathogenic mutations detected Variant of Uncertain Significance in MUTYH  Genes Analyzed: 83 genes on [facility]'s Multi-Cancer panel (ALK, APC, ATM, AXIN2, BAP1, BARD1, BLM, BMPR1A, BRCA1, BRCA2, BRIP1, CASR, CDC73, CDH1, CDK4, CDKN1B, CDKN1C, CDKN2A,  CEBPA, CHEK2, CTNNA1, DICER1, DIS3L2, EGFR, EPCAM, FH, FLCN, GATA2, GPC3, GREM1, HOXB13, HRAS, KIT, MAX, MEN1, MET, MITF, MLH1, MSH2, MSH3, MSH6, MUTYH, NBN, NF1, NF2, NTHL1, PALB2, PDGFRA, PHOX2B, PMS2, POLD1, POLE, POT1, PRKAR1A, PTCH1, PTEN, RAD50, RAD51C, RAD51D, RB1, RECQL4, RET, RUNX1, SDHA, SDHAF2, SDHB, SDHC, SDHD, SMAD4, SMARCA4, SMARCB1, SMARCE1, STK11, SUFU, TERC, TERT, TMEM127, TP53, TSC1, TSC2, VHL, WRN, WT1).    06/22/2017 Surgery    Left mastectomy: DCIS intermediate grade, 0/2 lymph nodes negative, ER 95%, PR 90%, Tis N0 stage 0 right mastectomy: Benign     CHIEF COMPLIANT: Surveillance of breast cancer  INTERVAL HISTORY: [name] is a 68 y.o. with above-mentioned history of thyroid cancer and bilateral breast cancer, right breast cancer in 2003, left breast in 2018, treated with bilateral mastectomies and reconstruction. I last saw her one year ago. A PET scan on 12/30/17 showed no evidence of malignancy. She presents to the clinic alone today. She had her expanders removed and breast implants placed on 05/26/18 and is recovering well. Her son passed away in 12/22/22 and she has had a difficult year. She is being seen by Dr. Marlou Starks and her PCP.   REVIEW OF SYSTEMS:   Constitutional: Denies fevers, chills or abnormal weight loss Eyes: Denies blurriness of vision Ears, nose, mouth, throat, and face: Denies mucositis or sore throat Respiratory: Denies cough, dyspnea or wheezes Cardiovascular: Denies palpitation, chest discomfort Gastrointestinal: Denies nausea, heartburn or change in bowel habits Skin: Denies abnormal skin rashes Lymphatics: Denies new lymphadenopathy or easy bruising Neurological: Denies numbness, tingling or new weaknesses Behavioral/Psych: Mood is  stable, no new changes  Extremities: No lower extremity edema Breast: denies any pain or lumps or nodules in either breasts All other systems were reviewed with the patient and are negative.  I have reviewed the  past medical history, past surgical history, social history and family history with the patient and they are unchanged from previous note.  ALLERGIES:  has No Known Allergies.  MEDICATIONS:  Current Outpatient Medications  Medication Sig Dispense Refill  . chlorthalidone (HYGROTON) 25 MG tablet Take 25 mg by mouth daily.    . cholecalciferol (VITAMIN D) 1000 units tablet Take 1,000 Units by mouth daily.    . diclofenac sodium (VOLTAREN) 1 % GEL As needed.    Marland Kitchen ibuprofen (ADVIL,MOTRIN) 600 MG tablet As needed.    . potassium chloride SA (K-DUR,KLOR-CON) 20 MEQ tablet Take 20 mEq by mouth 3 (three) times daily.    Marland Kitchen SYNTHROID 100 MCG tablet Take 1 tablet by mouth daily.     No current facility-administered medications for this visit.     PHYSICAL EXAMINATION: ECOG PERFORMANCE STATUS: 0 - Asymptomatic  Vitals:   07/08/18 1011  BP: (!) 143/70  Pulse: 80  Resp: 18  Temp: 97.7 F (36.5 C)  SpO2: 100%   Filed Weights   07/08/18 1011  Weight: 196 lb 8 oz (89.1 kg)    GENERAL: alert, no distress and comfortable SKIN: skin color, texture, turgor are normal, no rashes or significant lesions EYES: normal, Conjunctiva are pink and non-injected, sclera clear OROPHARYNX: no exudate, no erythema and lips, buccal mucosa, and tongue normal  NECK: supple, thyroid normal size, non-tender, without nodularity LYMPH: no palpable lymphadenopathy in the cervical, axillary or inguinal LUNGS: clear to auscultation and percussion with normal breathing effort HEART: regular rate & rhythm and no murmurs and no lower extremity edema ABDOMEN: abdomen soft, non-tender and normal bowel sounds MUSCULOSKELETAL: no cyanosis of digits and no clubbing  NEURO: alert & oriented x 3 with fluent speech, no focal motor/sensory deficits EXTREMITIES: No lower extremity edema BREAST: Bilateral breast implants intact. No palpable masses or nodules. No palpable axillary supraclavicular or infraclavicular adenopathy no  breast tenderness or nipple discharge. (exam performed in the presence of a chaperone)  LABORATORY DATA:  I have reviewed the data as listed CMP Latest Ref Rng & Units 05/24/2018 03/03/2018 02/26/2018  Glucose 70 - 99 mg/dL 93 105(H) 93  BUN 8 - 23 mg/dL '14 9 12  '$ Creatinine 0.44 - 1.00 mg/dL 0.98 0.90 0.95  Sodium 135 - 145 mmol/L 139 143 139  Potassium 3.5 - 5.1 mmol/L 3.4(L) 3.5 2.5(LL)  Chloride 98 - 111 mmol/L 102 105 101  CO2 22 - 32 mmol/L 26 - 30  Calcium 8.9 - 10.3 mg/dL 9.3 - 9.3  Total Protein 6.4 - 8.3 g/dL - - -  Total Bilirubin 0.20 - 1.20 mg/dL - - -  Alkaline Phos 40 - 150 U/L - - -  AST 5 - 34 U/L - - -  ALT 0 - 55 U/L - - -    Lab Results  Component Value Date   WBC 7.1 12/12/2017   HGB 12.9 03/03/2018   HCT 38.0 03/03/2018   MCV 88.7 12/12/2017   PLT 330 12/12/2017   NEUTROABS 4.1 06/27/2013    ASSESSMENT & PLAN:  Ductal carcinoma in situ (DCIS) of left breast 06/22/2017: Left mastectomy: DCIS intermediate grade, 0/2 lymph nodes negative, ER 95%, PR 90%, Tis N0 stage 0 right mastectomy: Benign  Breast reconstruction surgery finally completed January  2020  Breast cancer surveillance: Breast exam 07/08/2018: No palpable lumps or nodules in the axilla.  Intact bilateral implants.  Since the patient is seeing Dr. Marlou Starks and Dr. Elisabeth Cara, I will see her on an as-needed basis.    No orders of the defined types were placed in this encounter.  The patient has a good understanding of the overall plan. she agrees with it. she will call with any problems that may develop before the next visit here.  Nicholas Lose, MD 07/08/2018  Julious Oka Dorshimer am acting as scribe for Dr. Nicholas Lose.  I have reviewed the above documentation for accuracy and completeness, and I agree with the above.

## 2018-07-08 ENCOUNTER — Other Ambulatory Visit: Payer: Self-pay

## 2018-07-08 ENCOUNTER — Inpatient Hospital Stay: Payer: Managed Care, Other (non HMO) | Attending: Hematology and Oncology | Admitting: Hematology and Oncology

## 2018-07-08 DIAGNOSIS — Z853 Personal history of malignant neoplasm of breast: Secondary | ICD-10-CM | POA: Diagnosis not present

## 2018-07-08 DIAGNOSIS — Z85850 Personal history of malignant neoplasm of thyroid: Secondary | ICD-10-CM

## 2018-07-08 DIAGNOSIS — D0512 Intraductal carcinoma in situ of left breast: Secondary | ICD-10-CM

## 2018-07-08 NOTE — Assessment & Plan Note (Signed)
06/22/2017: Left mastectomy: DCIS intermediate grade, 0/2 lymph nodes negative, ER 95%, PR 90%, Tis N0 stage 0 right mastectomy: Benign  Breast reconstruction surgery finally completed January 2020  Breast cancer surveillance: Breast exam 07/08/2018: No palpable lumps or nodules in the axilla.  Intact bilateral implants.  Since the patient is seeing Dr. Marlou Starks and Dr. Elisabeth Cara, I will see her on an as-needed basis.

## 2018-08-13 ENCOUNTER — Ambulatory Visit: Payer: Managed Care, Other (non HMO) | Admitting: Plastic Surgery

## 2018-08-13 ENCOUNTER — Telehealth: Payer: Self-pay | Admitting: Plastic Surgery

## 2018-08-13 NOTE — Telephone Encounter (Signed)
Called patient to confirm appointment scheduled for tomorrow. Patient answered the following questions: °1.Has the patient traveled outside of the state of [place] at all within the past 6 weeks? No °2.Does the patient have a fever or cough at all? No °3.Has the patient been tested for COVID? Had a positive COVID test? No °4. Has the patient been in contact with anyone who has tested positive? No ° °

## 2018-08-16 ENCOUNTER — Ambulatory Visit: Payer: Managed Care, Other (non HMO) | Admitting: Plastic Surgery

## 2018-08-16 ENCOUNTER — Ambulatory Visit (INDEPENDENT_AMBULATORY_CARE_PROVIDER_SITE_OTHER): Payer: Managed Care, Other (non HMO) | Admitting: Plastic Surgery

## 2018-08-16 ENCOUNTER — Encounter: Payer: Self-pay | Admitting: Plastic Surgery

## 2018-08-16 ENCOUNTER — Other Ambulatory Visit: Payer: Self-pay

## 2018-08-16 VITALS — BP 172/104 | HR 93 | Temp 98.0°F | Ht 64.0 in | Wt 202.2 lb

## 2018-08-16 DIAGNOSIS — Z9882 Breast implant status: Secondary | ICD-10-CM

## 2018-08-16 DIAGNOSIS — Z9889 Other specified postprocedural states: Secondary | ICD-10-CM

## 2018-08-16 NOTE — Progress Notes (Addendum)
   Subjective:    Patient ID: [name], female    DOB: 1950/08/21, 68 y.o.   MRN: [id number]  The patient is a 68 year old female here for follow-up on her bilateral breast reconstruction.  Overall she is very pleased and doing very well at home.  She had a right latissimus muscle flap.  She has an expander on both sides.  There is no sign of seroma or hematoma.  All incisions are healing very nicely.  There is a little bit of asymmetry with a slightly higher implant on the left compared to the right.  She also has a little bit of excess soft tissue and skin on the left.  She is interested in nipple areola tattoo reconstruction.  She is also interested in fat grafting.   Review of Systems  Constitutional: Negative.  Negative for activity change and appetite change.  HENT: Negative.   Eyes: Negative.   Respiratory: Negative.  Negative for chest tightness and shortness of breath.   Cardiovascular: Negative.  Negative for leg swelling.  Gastrointestinal: Negative.  Negative for abdominal pain.  Genitourinary: Negative.   Skin: Negative for wound.  Psychiatric/Behavioral: Negative.        Objective:   Physical Exam Vitals signs and nursing note reviewed.  Constitutional:      Appearance: Normal appearance.  HENT:     Head: Normocephalic and atraumatic.  Cardiovascular:     Rate and Rhythm: Normal rate.  Pulmonary:     Effort: Pulmonary effort is normal.  Skin:    General: Skin is warm.  Neurological:     General: No focal deficit present.     Mental Status: She is alert.  Psychiatric:        Mood and Affect: Mood normal.        Behavior: Behavior normal.       Assessment & Plan:  S/P breast reconstruction, bilateral  History of reconstruction of both breasts  The patient will come and see [place] again in 3 to 6 months.  She does not want to do anything right now.  When the time comes she is interested in nipple areolar reconstruction with tattoo.  She is also interested  in fat grafting for improved contour and possibly excision of excess skin and soft tissue of the left breast.

## 2018-08-30 ENCOUNTER — Other Ambulatory Visit (HOSPITAL_BASED_OUTPATIENT_CLINIC_OR_DEPARTMENT_OTHER): Payer: Self-pay

## 2018-08-30 DIAGNOSIS — G4730 Sleep apnea, unspecified: Secondary | ICD-10-CM

## 2018-10-08 ENCOUNTER — Other Ambulatory Visit (HOSPITAL_COMMUNITY)
Admission: RE | Admit: 2018-10-08 | Discharge: 2018-10-08 | Disposition: A | Discharge status: Home / Self Care | Payer: Medicare Other | Source: Ambulatory Visit | Attending: Internal Medicine | Admitting: Internal Medicine

## 2018-10-08 DIAGNOSIS — Z01812 Encounter for preprocedural laboratory examination: Secondary | ICD-10-CM | POA: Diagnosis present

## 2018-10-08 DIAGNOSIS — Z1159 Encounter for screening for other viral diseases: Secondary | ICD-10-CM | POA: Insufficient documentation

## 2018-10-09 LAB — NOVEL CORONAVIRUS, NAA (HOSP ORDER, SEND-OUT TO REF LAB; TAT 18-24 HRS): SARS-CoV-2, NAA: NOT DETECTED

## 2018-10-11 ENCOUNTER — Ambulatory Visit (HOSPITAL_BASED_OUTPATIENT_CLINIC_OR_DEPARTMENT_OTHER): Payer: Medicare Other | Attending: Family Medicine | Admitting: Internal Medicine

## 2018-10-11 ENCOUNTER — Other Ambulatory Visit: Payer: Self-pay

## 2018-10-11 VITALS — Ht 64.0 in | Wt 220.0 lb

## 2018-10-11 DIAGNOSIS — G4730 Sleep apnea, unspecified: Secondary | ICD-10-CM | POA: Diagnosis present

## 2018-10-11 DIAGNOSIS — E669 Obesity, unspecified: Secondary | ICD-10-CM | POA: Diagnosis not present

## 2018-10-11 DIAGNOSIS — G4733 Obstructive sleep apnea (adult) (pediatric): Secondary | ICD-10-CM | POA: Insufficient documentation

## 2018-10-11 DIAGNOSIS — I10 Essential (primary) hypertension: Secondary | ICD-10-CM | POA: Diagnosis not present

## 2018-10-11 DIAGNOSIS — Z6838 Body mass index (BMI) 38.0-38.9, adult: Secondary | ICD-10-CM | POA: Insufficient documentation

## 2018-10-16 DIAGNOSIS — G4730 Sleep apnea, unspecified: Secondary | ICD-10-CM | POA: Diagnosis not present

## 2018-10-16 NOTE — Procedures (Signed)
     Patient Name: [name], [name] Date: 10/11/2018 Gender: Female D.O.B: Jul 21, 1950 Age (years): 67 Referring Provider: Lucianne Lei Height (inches): 64 Interpreting Physician: Baird Lyons MD, ABSM Weight (lbs): 220 RPSGT: Laren Everts BMI: 12 MRN: [id number] Neck Size: 15.00  CLINICAL INFORMATION Sleep Study Type: NPSG Indication for sleep study: Fatigue, Hypertension, Obesity, Snoring, Witnessed Apneas Epworth Sleepiness Score: 8  SLEEP STUDY TECHNIQUE As per the AASM Manual for the Scoring of Sleep and Associated Events v2.3 (April 2016) with a hypopnea requiring 4% desaturations.  The channels recorded and monitored were frontal, central and occipital EEG, electrooculogram (EOG), submentalis EMG (chin), nasal and oral airflow, thoracic and abdominal wall motion, anterior tibialis EMG, snore microphone, electrocardiogram, and pulse oximetry.  MEDICATIONS Medications self-administered by patient taken the night of the study : none reported  SLEEP ARCHITECTURE The study was initiated at 11:02:13 PM and ended at 5:05:31 AM.  Sleep onset time was 49.0 minutes and the sleep efficiency was 58.8%%. The total sleep time was 213.5 minutes.  Stage REM latency was 245.0 minutes.  The patient spent 10.5%% of the night in stage N1 sleep, 82.4%% in stage N2 sleep, 0.0%% in stage N3 and 7% in REM.  Alpha intrusion was absent.  Supine sleep was 32.32%.  RESPIRATORY PARAMETERS The overall apnea/hypopnea index (AHI) was 5.6 per hour. There were 5 total apneas, including 0 obstructive, 5 central and 0 mixed apneas. There were 15 hypopneas and 28 RERAs.  The AHI during Stage REM sleep was 48.0 per hour.  AHI while supine was 5.2 per hour.  The mean oxygen saturation was 93.2%. The minimum SpO2 during sleep was 83.0%.  moderate snoring was noted during this study.  CARDIAC DATA The 2 lead EKG demonstrated sinus rhythm. The mean heart rate was 62.9 beats per minute. Other  EKG findings include: None.  LEG MOVEMENT DATA The total PLMS were 0 with a resulting PLMS index of 0.0. Associated arousal with leg movement index was 0.3 .  IMPRESSIONS - Mild obstructive sleep apnea occurred during this study (AHI = 5.6/h). - No significant central sleep apnea occurred during this study (CAI = 1.4/h). - Mild oxygen desaturation was noted during this study (Min O2 = 83.0%). Mean sat 93.2%. - The patient snored with moderate snoring volume. - No cardiac abnormalities were noted during this study. - Clinically significant periodic limb movements did not occur during sleep. No significant associated arousals.  DIAGNOSIS - Obstructive Sleep Apnea (327.23 [G47.33 ICD-10])  RECOMMENDATIONS - Treatment for very mild obstructive sleep apnea is directed at symptoms. Conservative measures may include observation, weight loss and sleep position off back.  - Other options, including CPAP, a fitted oral appliance, or ENT evaluation, would be based on clinical judgment. - Be careful with alcohol, sedatives and other CNS depressants that may worsen sleep apnea and disrupt normal sleep architecture. - Sleep hygiene should be reviewed to assess factors that may improve sleep quality. - Weight management and regular exercise should be initiated or continued if appropriate.  [Electronically signed] 10/16/2018 11:42 AM  Baird Lyons MD, ABSM Diplomate, American Board of Sleep Medicine   NPI: [id number]                        [place]  ELECTRONICALLY SIGNED ON:  10/16/2018, 11:38 AM [place] PH: [phone]   FX: [phone] [place]

## 2018-10-29 DIAGNOSIS — G4730 Sleep apnea, unspecified: Secondary | ICD-10-CM | POA: Diagnosis not present

## 2018-10-29 DIAGNOSIS — I10 Essential (primary) hypertension: Secondary | ICD-10-CM | POA: Diagnosis not present

## 2018-10-29 DIAGNOSIS — R7309 Other abnormal glucose: Secondary | ICD-10-CM | POA: Diagnosis not present

## 2018-10-29 DIAGNOSIS — E034 Atrophy of thyroid (acquired): Secondary | ICD-10-CM | POA: Diagnosis not present

## 2018-10-29 DIAGNOSIS — N390 Urinary tract infection, site not specified: Secondary | ICD-10-CM | POA: Diagnosis not present

## 2018-10-29 DIAGNOSIS — E1169 Type 2 diabetes mellitus with other specified complication: Secondary | ICD-10-CM | POA: Diagnosis not present

## 2018-11-11 ENCOUNTER — Other Ambulatory Visit: Payer: Self-pay

## 2018-11-11 ENCOUNTER — Ambulatory Visit: Payer: Medicare Other | Attending: Orthopedic Surgery | Admitting: Physical Therapy

## 2018-11-11 ENCOUNTER — Encounter: Payer: Self-pay | Admitting: Physical Therapy

## 2018-11-11 DIAGNOSIS — R252 Cramp and spasm: Secondary | ICD-10-CM | POA: Diagnosis present

## 2018-11-11 DIAGNOSIS — M79645 Pain in left finger(s): Secondary | ICD-10-CM | POA: Insufficient documentation

## 2018-11-11 DIAGNOSIS — M6281 Muscle weakness (generalized): Secondary | ICD-10-CM | POA: Diagnosis present

## 2018-11-11 DIAGNOSIS — M25512 Pain in left shoulder: Secondary | ICD-10-CM | POA: Diagnosis present

## 2018-11-11 DIAGNOSIS — M25522 Pain in left elbow: Secondary | ICD-10-CM | POA: Diagnosis present

## 2018-11-11 DIAGNOSIS — G8929 Other chronic pain: Secondary | ICD-10-CM | POA: Diagnosis present

## 2018-11-11 NOTE — Therapy (Signed)
[facility] [address], [place], [place], [address] Phone: [phone]   Fax:  [phone]  Physical Therapy Evaluation  Patient Details  Name: [name] MRN: [id number] Date of Birth: Dec 14, 1950 Referring Provider (PT): Renette Butters, MD   Encounter Date: 11/11/2018  PT End of Session - 11/11/18 2129    Visit Number  1    Date for PT Re-Evaluation  01/06/19    Authorization Type  [employer], pre-cert required    PT Start Time  1900    PT Stop Time  1945    PT Time Calculation (min)  45 min    Activity Tolerance  Patient tolerated treatment well    Behavior During Therapy  [facility] for tasks assessed/performed       Past Medical History:  Diagnosis Date  . Anemia    "years ago"  . Arthritis    "left hand; was in my knees" (09/24/2017)  . Breast cancer, right breast ([place]) 2004  . Bruises easily   . Chronic lower back pain   . Ductal carcinoma in situ (DCIS) of left breast 2018  . Genetic testing 04/17/2017   STAT Breast panel with reflex to Multi-Cancer panel (83 genes) @ [facility] - No pathogenic mutations detected  . History of bronchitis    when she was young  . Hypertension    no longer taking medications as of August 2019  . Hypothyroidism    takes Synthroid daily  . Joint pain   . Joint swelling   . Personal history of chemotherapy 04/2003  . Personal history of radiation therapy 2005  . Pneumonia    hx of when she was young  . Right elbow tendonitis   . Sleep apnea    has a machine but does not use (09/24/2017)  . Thyroid cancer ([place]) 2012  . Urinary frequency     Past Surgical History:  Procedure Laterality Date  . ANTERIOR CERVICAL DECOMP/DISCECTOMY FUSION  2008  . APPENDECTOMY  2007  . BREAST BIOPSY Right 02/28/2003   malignant  . BREAST BIOPSY Left 03/2017  . BREAST LUMPECTOMY Right 2004  . BREAST RECONSTRUCTION WITH PLACEMENT OF TISSUE EXPANDER AND FLEX HD (ACELLULAR HYDRATED  DERMIS) Bilateral 06/22/2017   Procedure: IMMEDIATE BILATERAL BREAST RECONSTRUCTION WITH PLACEMENT OF TISSUE EXPANDER AND FLEX HD (ACELLULAR HYDRATED DERMIS);  Surgeon: Wallace Going, DO;  Location: [place];  Service: Plastics;  Laterality: Bilateral;  . CESAREAN SECTION  1981  . COLONOSCOPY  X 2  . JOINT REPLACEMENT    . KNEE ARTHROSCOPY Right 2007  . KNEE ARTHROSCOPY  05/02/2011   Procedure: ARTHROSCOPY KNEE;  Surgeon: Johnn Hai;  Location: [place];  Service: Orthopedics;  Laterality: Right;  . LATISSIMUS FLAP TO BREAST Right 09/23/2017   Procedure: RIGHT BREAST LATISSIMUS DORSI FLAP RECONSTRUCTION WITH TISSUE EXPANDER;  Surgeon: Wallace Going, DO;  Location: [place];  Service: Plastics;  Laterality: Right;  . MASTECTOMY W/ SENTINEL NODE BIOPSY Bilateral 06/22/2017   Procedure: LEFT MASTECTOMY WITH SENTINEL LYMPH NODE BIOPSY AND RIGHT PROPHYLACTIC MASTECTOMY;  Surgeon: Jovita Kussmaul, MD;  Location: [place];  Service: General;  Laterality: Bilateral;  . MENISCUS DEBRIDEMENT  05/02/2011   Procedure: DEBRIDEMENT OF MENISCUS;  Surgeon: Johnn Hai;  Location: [place];  Service: Orthopedics;  Laterality: Right;  . PORTA CATH REMOVAL  2005  . PORTACATH PLACEMENT  2004  . REMOVAL OF BILATERAL TISSUE EXPANDERS  WITH PLACEMENT OF BILATERAL BREAST IMPLANTS Bilateral 05/26/2018   Procedure: REMOVAL OF BILATERAL TISSUE EXPANDERS WITH PLACEMENT OF BILATERAL BREAST IMPLANTS;  Surgeon: Wallace Going, DO;  Location: [place];  Service: Plastics;  Laterality: Bilateral;  . REMOVAL OF TISSUE EXPANDER AND PLACEMENT OF IMPLANT Right 12/12/2017   Procedure: REMOVAL OF TISSUE EXPANDER, RIGHT;  Surgeon: Irene Limbo, MD;  Location: [place];  Service: Plastics;  Laterality: Right;  . THYROIDECTOMY N/A 09/22/2012   Procedure: RIGHT THYROID LOBECTOMY WITH FROZEN SECTION;  Surgeon: Izora Gala, MD;  Location:  [place];  Service: ENT;  Laterality: N/A;  . THYROIDECTOMY Left 10/21/2012   Procedure: COMPLETION OF THYROIDECTOMY;  Surgeon: Izora Gala, MD;  Location: [place];  Service: ENT;  Laterality: Left;  . TISSUE EXPANDER PLACEMENT Right 03/03/2018   Procedure: TISSUE EXPANDER PLACEMENT;  Surgeon: Wallace Going, DO;  Location: [place];  Service: Plastics;  Laterality: Right;  . TOTAL KNEE ARTHROPLASTY  04/14/2012   Procedure: TOTAL KNEE ARTHROPLASTY;  Surgeon: Ninetta Lights, MD;  Location: [place];  Service: Orthopedics;  Laterality: Right;  RIGHT ARTHROPLASTY KNEE MEDIAL/LATERAL COMPARTMENTS WITH PATELLA RESURFACING  . TOTAL KNEE ARTHROPLASTY Left 03/30/2013   Procedure: TOTAL KNEE ARTHROPLASTY;  Surgeon: Ninetta Lights, MD;  Location: [place];  Service: Orthopedics;  Laterality: Left;    There were no vitals filed for this visit.   Subjective Assessment - 11/11/18 1858    Subjective  Pt is a right-hand dominant previous patient who returns to outpatient PT with continued Lt thenar eminence pain which radiates up posterior forearm to elbow.  She notes when she pushes on Lt thenar eminence she feels pain in superolateral shoulder.    Pertinent History  breast reconstruction    Limitations  Lifting;House hold activities    Diagnostic tests  not yet    Patient Stated Goals  pain relief    Currently in Pain?  Yes    Pain Score  9     Pain Location  Hand    Pain Orientation  Left;Proximal;Anterior    Pain Descriptors / Indicators  Radiating;Aching;Throbbing    Pain Type  Chronic pain    Pain Radiating Towards  posterior forearm, anterolateral shoulder    Pain Onset  More than a month ago    Pain Frequency  Constant    Aggravating Factors   use of Lt hand, pressing on thenar eminence    Pain Relieving Factors  no         [facility] PT Assessment - 11/11/18 0001      Assessment   Medical Diagnosis  G90.2 (ICD-10-CM) - Cervical sympathetic dystrophy of left side    Referring  Provider (PT)  Renette Butters, MD    Onset Date/Surgical Date  --   2 years ago   Hand Dominance  Right    Next MD Visit  --   no   Prior Therapy  yes      Precautions   Precautions  None      Restrictions   Weight Bearing Restrictions  No      Balance Screen   Has the patient fallen in the past 6 months  No    Has the patient had a decrease in activity level because of a fear of falling?   No    Is the patient reluctant to leave their home because of a fear of falling?   No      Home Environment  Living Environment  Private residence    Living Arrangements  Other relatives    Type of [place]      Prior Function   Level of Independence  Independent      Cognition   Overall Cognitive Status  Within Functional Limits for tasks assessed      Observation/Other Assessments   Focus on Therapeutic Outcomes (FOTO)   39%   goal 38%     ROM / Strength   AROM / PROM / Strength  Strength;AROM      AROM   Overall AROM Comments  Lt shoulder WFL with end range capsular stiffness, Lt elbow WFL without pain, Lt wrist WFL without pain    AROM Assessment Site  Thumb    Right/Left Thumb  Left    Left Thumb Opposition  Digit 5      Strength   Overall Strength Comments  Lt shoulder 4-/5 throughout with pain on abduction/ER, Lt elbow 4-/5 throughout, Lt wrist 4/5 throughout, Lt thumb resisted isometrics weak and painful    Strength Assessment Site  Hand    Right/Left hand  Left;Right    Right Hand Grip (lbs)  48    Left Hand Grip (lbs)  28      Palpation   Palpation comment  tender on Lt: thenar eminence, CMC joint, ECRL/B, supra/infraspinatus tendons, AC joint, teres minor, infrapsinatus, posterior deltoid.  Palpation of thenar eminence and posterior forearm cause elbow and shoulder pain      Special Tests    Special Tests  --    Other special tests  upper limb tension tests symmetrical bil, full range achieved                Objective measurements completed on  examination: See above findings.      [facility] Adult PT Treatment/Exercise - 11/11/18 0001      Manual Therapy   Manual Therapy  Soft tissue mobilization;Passive ROM    Soft tissue mobilization  Lt posterior forearm (ECRL/B) after dry needling    Passive ROM  Lt posterior forearm passive stretch       Trigger Point Dry Needling - 11/11/18 0001    Consent Given?  Yes    Education Handout Provided  Previously provided    Muscles Treated Wrist/Hand  Extensor carpi radialis longus/brevis    Extensor carpi radialis longus/brevis Response  Twitch response elicited;Palpable increased muscle length             PT Short Term Goals - 11/11/18 2142      PT SHORT TERM GOAL #1   Title  Pt ind in initial HEP    Time  3    Period  Weeks    Status  New    Target Date  12/02/18      PT SHORT TERM GOAL #2   Title  Pt will report reduced pain in Lt UE by at least 50% with ADLs    Time  4    Period  Weeks    Status  New    Target Date  12/09/18        PT Long Term Goals - 11/11/18 2143      PT LONG TERM GOAL #1   Title  Pt will be ind in advanced HEP and understand how to safely progress.    Time  8    Period  Weeks    Status  New    Target Date  01/06/19  PT LONG TERM GOAL #2   Title  Pt will achieve Lt grip strength within at least 10 deg of her dominant Rt hand.    Baseline  20 deg discrepancy at eval    Time  8    Period  Weeks    Status  New    Target Date  01/06/19      PT LONG TERM GOAL #3   Title  Pt will achieve at least 4+/5 strength rating throughout Lt thumb, wrist, elbow and shoulder to improve functional use of Lt UE.    Time  8    Period  Weeks    Status  New    Target Date  01/06/19      PT LONG TERM GOAL #4   Title  Pt will report improvement in pain with daily tasks by at least 75%.    Time  8    Period  Weeks    Status  New    Target Date  01/06/19      PT LONG TERM GOAL #5   Title  -             Plan - 11/11/18 2130    Clinical  Impression Statement  Pt is a prior patient and presents with progression of previous symptoms.  Pain is located in Lt thenar eminence and CMC joint and radiates into radial aspect of posterior forearm and top of Lt shoulder.  Radiating pain is worse with palpation of trigger points in thenar eminence and wrist extensors.  PT performed DN to ECRL/B today and Pt reported 80% relief of symptoms.  Pt continues to display painful weakness surrounding Lt thumb and has a grip strength discrepancy of 20# on Lt compaed to Rt.  Painful weakness extends into Lt elbow and shoulder as well.  Pt is limited in her functional use of Lt UE and practices some avoidance of tasks requiring carrying/lifting/reaching with this arm.  Lt shoulder end ranges are stiff and suggest capsular tightness. Upper limb tension tests are symmetrical bil.  Pt will benefit from skilled PT to address pain, strength throughout Lt UE including grip/thumb, ROM of elbow and shoulder, and functional activities to encourage return to functional use of Lt hand and UE.    Personal Factors and Comorbidities  Time since onset of injury/illness/exacerbation    Examination-Activity Limitations  Lift;Carry;Reach Overhead    Examination-Participation Restrictions  Cleaning;Shop    Stability/Clinical Decision Making  Stable/Uncomplicated    Clinical Decision Making  Low    Rehab Potential  Good    PT Frequency  2x / week    PT Duration  8 weeks    PT Treatment/Interventions  ADLs/Self Care Home Management;Cryotherapy;Electrical Stimulation;Iontophoresis 4mg /ml Dexamethasone;Moist Heat;Functional mobility training;Therapeutic activities;Therapeutic exercise;Neuromuscular re-education;Manual techniques;Patient/family education;Passive range of motion;Dry needling;Taping;Joint Manipulations;Spinal Manipulations    PT Next Visit Plan  f/u on DN to Lt posterior forearm, DN Lt thumb and shoulder, begin HEP for AA/ROM for shoulder, UE strength and mobility    PT  Home Exercise Plan  begin next visit    Consulted and Agree with Plan of Care  Patient       Patient will benefit from skilled therapeutic intervention in order to improve the following deficits and impairments:  Decreased range of motion, Impaired UE functional use, Increased muscle spasms, Decreased activity tolerance, Pain, Hypomobility, Impaired flexibility, Improper body mechanics, Decreased mobility, Decreased strength, Postural dysfunction  Visit Diagnosis: 1. Thumb pain, left   2. Pain in left elbow  3. Chronic left shoulder pain   4. Muscle weakness (generalized)   5. Cramp and spasm        Problem List Patient Active Problem List   Diagnosis Date Noted  . S/P breast reconstruction, bilateral 03/16/2018  . History of breast cancer in female 01/27/2018  . History of reconstruction of both breasts 01/27/2018  . Acquired absence of right breast 09/23/2017  . Breast cancer ([place]) 06/22/2017  . 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 Genetic testing 04/17/2017  . DJD (degenerative joint disease) of knee 03/30/2013  . Witnessed episode of apnea 03/23/2013  . 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 Weakness 11/28/2011  . Hypertension 11/28/2011  . Hypokalemia 11/28/2011  . LUE weakness 11/28/2011  . Hypothyroidism 11/28/2011  . Thyroid cancer ([place]) 04/28/2010    Yuya Vanwingerden, PT 11/11/18 10:01 PM   [facility] [address], [place], [place], [address] Phone: [phone]   Fax:  [phone]  Name: [name] MRN: [id number] Date of Birth: [birth year]

## 2018-11-23 ENCOUNTER — Ambulatory Visit: Payer: Medicare Other | Admitting: Physical Therapy

## 2018-11-23 ENCOUNTER — Encounter: Payer: Self-pay | Admitting: Physical Therapy

## 2018-11-23 ENCOUNTER — Other Ambulatory Visit: Payer: Self-pay

## 2018-11-23 DIAGNOSIS — M79645 Pain in left finger(s): Secondary | ICD-10-CM

## 2018-11-23 DIAGNOSIS — M25522 Pain in left elbow: Secondary | ICD-10-CM

## 2018-11-23 NOTE — Therapy (Signed)
[facility] [address] [place], [place], [address] Phone: [phone]   Fax:  [phone]  Physical Therapy Treatment  Patient Details  Name: [name] MRN: [id number] Date of Birth: [birth year] Referring Provider (PT): Renette Butters, MD   Encounter Date: 11/23/2018  PT End of Session - 11/23/18 1518    Visit Number  2    Number of Visits  12    Date for PT Re-Evaluation  01/06/19    Authorization Type  [employer], pre-cert required  12 visits approved    Authorization - Visit Number  12    Authorization - Number of Visits  2    PT Start Time  1510    PT Stop Time  1600    PT Time Calculation (min)  50 min    Activity Tolerance  Patient tolerated treatment well       Past Medical History:  Diagnosis Date  . Anemia    "years ago"  . Arthritis    "left hand; was in my knees" (09/24/2017)  . Breast cancer, right breast ([place]) 2004  . Bruises easily   . Chronic lower back pain   . Ductal carcinoma in situ (DCIS) of left breast 2018  . Genetic testing 04/17/2017   STAT Breast panel with reflex to Multi-Cancer panel (83 genes) @ [facility] - No pathogenic mutations detected  . History of bronchitis    when she was young  . Hypertension    no longer taking medications as of August 2019  . Hypothyroidism    takes Synthroid daily  . Joint pain   . Joint swelling   . Personal history of chemotherapy 04/2003  . Personal history of radiation therapy 2005  . Pneumonia    hx of when she was young  . Right elbow tendonitis   . Sleep apnea    has a machine but does not use (09/24/2017)  . Thyroid cancer ([place]) 2012  . Urinary frequency     Past Surgical History:  Procedure Laterality Date  . ANTERIOR CERVICAL DECOMP/DISCECTOMY FUSION  2008  . APPENDECTOMY  2007  . BREAST BIOPSY Right 02/28/2003   malignant  . BREAST BIOPSY Left 03/2017  . BREAST LUMPECTOMY Right 2004  . BREAST RECONSTRUCTION WITH  PLACEMENT OF TISSUE EXPANDER AND FLEX HD (ACELLULAR HYDRATED DERMIS) Bilateral 06/22/2017   Procedure: IMMEDIATE BILATERAL BREAST RECONSTRUCTION WITH PLACEMENT OF TISSUE EXPANDER AND FLEX HD (ACELLULAR HYDRATED DERMIS);  Surgeon: Wallace Going, DO;  Location: [place];  Service: Plastics;  Laterality: Bilateral;  . CESAREAN SECTION  1981  . COLONOSCOPY  X 2  . JOINT REPLACEMENT    . KNEE ARTHROSCOPY Right 2007  . KNEE ARTHROSCOPY  05/02/2011   Procedure: ARTHROSCOPY KNEE;  Surgeon: Johnn Hai;  Location: [place];  Service: Orthopedics;  Laterality: Right;  . LATISSIMUS FLAP TO BREAST Right 09/23/2017   Procedure: RIGHT BREAST LATISSIMUS DORSI FLAP RECONSTRUCTION WITH TISSUE EXPANDER;  Surgeon: Wallace Going, DO;  Location: [place];  Service: Plastics;  Laterality: Right;  . MASTECTOMY W/ SENTINEL NODE BIOPSY Bilateral 06/22/2017   Procedure: LEFT MASTECTOMY WITH SENTINEL LYMPH NODE BIOPSY AND RIGHT PROPHYLACTIC MASTECTOMY;  Surgeon: Jovita Kussmaul, MD;  Location: [place];  Service: General;  Laterality: Bilateral;  . MENISCUS DEBRIDEMENT  05/02/2011   Procedure: DEBRIDEMENT OF MENISCUS;  Surgeon: Johnn Hai;  Location: [place];  Service: Orthopedics;  Laterality: Right;  .  PORTA CATH REMOVAL  2005  . PORTACATH PLACEMENT  2004  . REMOVAL OF BILATERAL TISSUE EXPANDERS WITH PLACEMENT OF BILATERAL BREAST IMPLANTS Bilateral 05/26/2018   Procedure: REMOVAL OF BILATERAL TISSUE EXPANDERS WITH PLACEMENT OF BILATERAL BREAST IMPLANTS;  Surgeon: Wallace Going, DO;  Location: [place];  Service: Plastics;  Laterality: Bilateral;  . REMOVAL OF TISSUE EXPANDER AND PLACEMENT OF IMPLANT Right 12/12/2017   Procedure: REMOVAL OF TISSUE EXPANDER, RIGHT;  Surgeon: Irene Limbo, MD;  Location: [place];  Service: Plastics;  Laterality: Right;  . THYROIDECTOMY N/A 09/22/2012   Procedure: RIGHT THYROID  LOBECTOMY WITH FROZEN SECTION;  Surgeon: Izora Gala, MD;  Location: [place];  Service: ENT;  Laterality: N/A;  . THYROIDECTOMY Left 10/21/2012   Procedure: COMPLETION OF THYROIDECTOMY;  Surgeon: Izora Gala, MD;  Location: [place];  Service: ENT;  Laterality: Left;  . TISSUE EXPANDER PLACEMENT Right 03/03/2018   Procedure: TISSUE EXPANDER PLACEMENT;  Surgeon: Wallace Going, DO;  Location: [place];  Service: Plastics;  Laterality: Right;  . TOTAL KNEE ARTHROPLASTY  04/14/2012   Procedure: TOTAL KNEE ARTHROPLASTY;  Surgeon: Ninetta Lights, MD;  Location: [place];  Service: Orthopedics;  Laterality: Right;  RIGHT ARTHROPLASTY KNEE MEDIAL/LATERAL COMPARTMENTS WITH PATELLA RESURFACING  . TOTAL KNEE ARTHROPLASTY Left 03/30/2013   Procedure: TOTAL KNEE ARTHROPLASTY;  Surgeon: Ninetta Lights, MD;  Location: [place];  Service: Orthopedics;  Laterality: Left;    There were no vitals filed for this visit.  Subjective Assessment - 11/23/18 1513    Subjective  I no longer work at [employer] and my new job is much for flexible.  Shooting pains in left forearm.  Not too bad right now but left thenar eminence has been bothering me.    Pertinent History  breast reconstruction    Limitations  Lifting;House hold activities    Patient Stated Goals  pain relief    Currently in Pain?  Yes    Pain Score  8     Pain Location  Other (Comment)    Pain Orientation  Left                       [facility] Adult PT Treatment/Exercise - 11/23/18 0001      Elbow Exercises   Other elbow exercises  forearm extensor stretch 3x 30 sec       Shoulder Exercises: Supine   Other Supine Exercises  clasped hand shoulder flexion ROM 10x     Other Supine Exercises  elbows out stretch 3x 30 sec       Shoulder Exercises: Stretch   Other Shoulder Stretches  left upper trap stretch 3x 30 sec left       Modalities   Modalities  Moist Heat      Manual Therapy   Soft tissue mobilization  left thenar  eminence    Myofascial Release  Addaday instrument assisted to thenar eminence, forearm extensors, triceps, lateral and posterior deltoid and left upper trap       Trigger Point Dry Needling - 11/23/18 0001    Consent Given?  Yes    Dry Needling Comments  left     Opponens pollicis Response  Twitch response elicited;Palpable increased muscle length    Flexor pollicis brevis Response  Twitch response elicited;Palpable increased muscle length           PT Education - 11/23/18 1620    Education Details  Access Code: [email or web address]  clasped hand shoulder ROM, upper trap stretch, forearm extensor stretch; supine elbows out stretch    Person(s) Educated  Patient    Methods  Explanation;Demonstration;Handout    Comprehension  Verbalized understanding;Returned demonstration       PT Short Term Goals - 11/11/18 2142      PT SHORT TERM GOAL #1   Title  Pt ind in initial HEP    Time  3    Period  Weeks    Status  New    Target Date  12/02/18      PT SHORT TERM GOAL #2   Title  Pt will report reduced pain in Lt UE by at least 50% with ADLs    Time  4    Period  Weeks    Status  New    Target Date  12/09/18        PT Long Term Goals - 11/11/18 2143      PT LONG TERM GOAL #1   Title  Pt will be ind in advanced HEP and understand how to safely progress.    Time  8    Period  Weeks    Status  New    Target Date  01/06/19      PT LONG TERM GOAL #2   Title  Pt will achieve Lt grip strength within at least 10 deg of her dominant Rt hand.    Baseline  20 deg discrepancy at eval    Time  8    Period  Weeks    Status  New    Target Date  01/06/19      PT LONG TERM GOAL #3   Title  Pt will achieve at least 4+/5 strength rating throughout Lt thumb, wrist, elbow and shoulder to improve functional use of Lt UE.    Time  8    Period  Weeks    Status  New    Target Date  01/06/19      PT LONG TERM GOAL #4   Title  Pt will report improvement in pain with daily tasks by at least  75%.    Time  8    Period  Weeks    Status  New    Target Date  01/06/19      PT LONG TERM GOAL #5   Title  -            Plan - 11/23/18 1625    Clinical Impression Statement  The patient's primary complaint is left thenar eminence pain.  She has several tender points and is interested in dry needling since she has had a very positive response in the past.  She has several twitch responses which is a good prognostic indicator.  Also a positive response to instrument assisted soft tissue mobilization to left upper quarter.  Therapist closely monitoring response with all treatment interventions.  She reports good initial pain relief.    Personal Factors and Comorbidities  Time since onset of injury/illness/exacerbation    PT Frequency  2x / week    PT Duration  8 weeks    PT Treatment/Interventions  ADLs/Self Care Home Management;Cryotherapy;Electrical Stimulation;Iontophoresis 4mg /ml Dexamethasone;Moist Heat;Functional mobility training;Therapeutic activities;Therapeutic exercise;Neuromuscular re-education;Manual techniques;Patient/family education;Passive range of motion;Dry needling;Taping;Joint Manipulations;Spinal Manipulations    PT Next Visit Plan  assess response to Dn left thumb and manual therapy with Addaday to UE;  review HEP and progress;  add open books       Patient will benefit from skilled  therapeutic intervention in order to improve the following deficits and impairments:  Decreased range of motion, Impaired UE functional use, Increased muscle spasms, Decreased activity tolerance, Pain, Hypomobility, Impaired flexibility, Improper body mechanics, Decreased mobility, Decreased strength, Postural dysfunction  Visit Diagnosis: 1. Thumb pain, left   2. Pain in left elbow        Problem List Patient Active Problem List   Diagnosis Date Noted  . S/P breast reconstruction, bilateral 03/16/2018  . History of breast cancer in female 01/27/2018  . History of  reconstruction of both breasts 01/27/2018  . Acquired absence of right breast 09/23/2017  . Breast cancer ([place]) 06/22/2017  . 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 Genetic testing 04/17/2017  . DJD (degenerative joint disease) of knee 03/30/2013  . Witnessed episode of apnea 03/23/2013  . 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 Weakness 11/28/2011  . Hypertension 11/28/2011  . Hypokalemia 11/28/2011  . LUE weakness 11/28/2011  . Hypothyroidism 11/28/2011  . Thyroid cancer ([place]) 04/28/2010   Ruben Im, PT 11/23/18 4:31 PM Phone: [phone] Fax: [phone] Alvera Singh 11/23/2018, 4:31 PM  [facility] [address], [place], [place], [address] Phone: [phone]   Fax:  [phone]  Name: NADRA HRITZ MRN: [id number] Date of Birth: 03/11/1951

## 2018-11-30 ENCOUNTER — Ambulatory Visit: Payer: Medicare Other | Admitting: Physical Therapy

## 2018-12-02 ENCOUNTER — Encounter: Payer: Commercial Indemnity | Admitting: Physical Therapy

## 2018-12-07 ENCOUNTER — Ambulatory Visit: Payer: Medicare Other | Attending: Orthopedic Surgery | Admitting: Physical Therapy

## 2018-12-07 ENCOUNTER — Encounter: Payer: Self-pay | Admitting: Physical Therapy

## 2018-12-07 ENCOUNTER — Other Ambulatory Visit: Payer: Self-pay

## 2018-12-07 DIAGNOSIS — M25522 Pain in left elbow: Secondary | ICD-10-CM | POA: Insufficient documentation

## 2018-12-07 DIAGNOSIS — G8929 Other chronic pain: Secondary | ICD-10-CM | POA: Diagnosis present

## 2018-12-07 DIAGNOSIS — M25512 Pain in left shoulder: Secondary | ICD-10-CM | POA: Diagnosis present

## 2018-12-07 DIAGNOSIS — M79645 Pain in left finger(s): Secondary | ICD-10-CM | POA: Insufficient documentation

## 2018-12-07 NOTE — Therapy (Signed)
[facility] [address], [place], [place], [address] Phone: [phone]   Fax:  [phone]  Physical Therapy Treatment  Patient Details  Name: [name] MRN: [id number] Date of Birth: [birth year] Referring Provider (PT): Renette Butters, MD   Encounter Date: 12/07/2018  PT End of Session - 12/07/18 2152    Visit Number  3    Number of Visits  12    Date for PT Re-Evaluation  01/06/19    Authorization Type  [employer], pre-cert required  12 visits approved    Authorization - Visit Number  12    Authorization - Number of Visits  3    PT Start Time  2706    PT Stop Time  1430    PT Time Calculation (min)  47 min    Activity Tolerance  Patient tolerated treatment well       Past Medical History:  Diagnosis Date  . Anemia    "years ago"  . Arthritis    "left hand; was in my knees" (09/24/2017)  . Breast cancer, right breast ([place]) 2004  . Bruises easily   . Chronic lower back pain   . Ductal carcinoma in situ (DCIS) of left breast 2018  . Genetic testing 04/17/2017   STAT Breast panel with reflex to Multi-Cancer panel (83 genes) @ [facility] - No pathogenic mutations detected  . History of bronchitis    when she was young  . Hypertension    no longer taking medications as of August 2019  . Hypothyroidism    takes Synthroid daily  . Joint pain   . Joint swelling   . Personal history of chemotherapy 04/2003  . Personal history of radiation therapy 2005  . Pneumonia    hx of when she was young  . Right elbow tendonitis   . Sleep apnea    has a machine but does not use (09/24/2017)  . Thyroid cancer ([place]) 2012  . Urinary frequency     Past Surgical History:  Procedure Laterality Date  . ANTERIOR CERVICAL DECOMP/DISCECTOMY FUSION  2008  . APPENDECTOMY  2007  . BREAST BIOPSY Right 02/28/2003   malignant  . BREAST BIOPSY Left 03/2017  . BREAST LUMPECTOMY Right 2004  . BREAST RECONSTRUCTION WITH  PLACEMENT OF TISSUE EXPANDER AND FLEX HD (ACELLULAR HYDRATED DERMIS) Bilateral 06/22/2017   Procedure: IMMEDIATE BILATERAL BREAST RECONSTRUCTION WITH PLACEMENT OF TISSUE EXPANDER AND FLEX HD (ACELLULAR HYDRATED DERMIS);  Surgeon: Wallace Going, DO;  Location: [place];  Service: Plastics;  Laterality: Bilateral;  . CESAREAN SECTION  1981  . COLONOSCOPY  X 2  . JOINT REPLACEMENT    . KNEE ARTHROSCOPY Right 2007  . KNEE ARTHROSCOPY  05/02/2011   Procedure: ARTHROSCOPY KNEE;  Surgeon: Johnn Hai;  Location: [place];  Service: Orthopedics;  Laterality: Right;  . LATISSIMUS FLAP TO BREAST Right 09/23/2017   Procedure: RIGHT BREAST LATISSIMUS DORSI FLAP RECONSTRUCTION WITH TISSUE EXPANDER;  Surgeon: Wallace Going, DO;  Location: [place];  Service: Plastics;  Laterality: Right;  . MASTECTOMY W/ SENTINEL NODE BIOPSY Bilateral 06/22/2017   Procedure: LEFT MASTECTOMY WITH SENTINEL LYMPH NODE BIOPSY AND RIGHT PROPHYLACTIC MASTECTOMY;  Surgeon: Jovita Kussmaul, MD;  Location: [place];  Service: General;  Laterality: Bilateral;  . MENISCUS DEBRIDEMENT  05/02/2011   Procedure: DEBRIDEMENT OF MENISCUS;  Surgeon: Johnn Hai;  Location: [place];  Service: Orthopedics;  Laterality: Right;  .  PORTA CATH REMOVAL  2005  . PORTACATH PLACEMENT  2004  . REMOVAL OF BILATERAL TISSUE EXPANDERS WITH PLACEMENT OF BILATERAL BREAST IMPLANTS Bilateral 05/26/2018   Procedure: REMOVAL OF BILATERAL TISSUE EXPANDERS WITH PLACEMENT OF BILATERAL BREAST IMPLANTS;  Surgeon: Wallace Going, DO;  Location: [place];  Service: Plastics;  Laterality: Bilateral;  . REMOVAL OF TISSUE EXPANDER AND PLACEMENT OF IMPLANT Right 12/12/2017   Procedure: REMOVAL OF TISSUE EXPANDER, RIGHT;  Surgeon: Irene Limbo, MD;  Location: [place];  Service: Plastics;  Laterality: Right;  . THYROIDECTOMY N/A 09/22/2012   Procedure: RIGHT THYROID  LOBECTOMY WITH FROZEN SECTION;  Surgeon: Izora Gala, MD;  Location: [place];  Service: ENT;  Laterality: N/A;  . THYROIDECTOMY Left 10/21/2012   Procedure: COMPLETION OF THYROIDECTOMY;  Surgeon: Izora Gala, MD;  Location: [place];  Service: ENT;  Laterality: Left;  . TISSUE EXPANDER PLACEMENT Right 03/03/2018   Procedure: TISSUE EXPANDER PLACEMENT;  Surgeon: Wallace Going, DO;  Location: [place];  Service: Plastics;  Laterality: Right;  . TOTAL KNEE ARTHROPLASTY  04/14/2012   Procedure: TOTAL KNEE ARTHROPLASTY;  Surgeon: Ninetta Lights, MD;  Location: [place];  Service: Orthopedics;  Laterality: Right;  RIGHT ARTHROPLASTY KNEE MEDIAL/LATERAL COMPARTMENTS WITH PATELLA RESURFACING  . TOTAL KNEE ARTHROPLASTY Left 03/30/2013   Procedure: TOTAL KNEE ARTHROPLASTY;  Surgeon: Ninetta Lights, MD;  Location: [place];  Service: Orthopedics;  Laterality: Left;    There were no vitals filed for this visit.  Subjective Assessment - 12/07/18 1348    Subjective  I'm feeling OK.  I think at some point I'm going to have to have something something done (surgery).  Maybe at the end of the year.  Constant sore in thumb, lateral forearm and to the top of the shoulder.  Pain with driving sometimes.  DN and soft tissue work gives temporary relief only.    Currently in Pain?  Yes    Pain Score  8     Pain Location  Hand    Pain Orientation  Left    Pain Type  Acute pain    Aggravating Factors   pressing on thenar eminence;  turning hand over while in bed;  driving with 2 hands                       [facility] Adult PT Treatment/Exercise - 12/07/18 0001      Self-Care   Self-Care  Other Self-Care Comments    Other Self-Care Comments   discussed using support braces to rest CMC joint while working       Elbow Exercises   Other elbow exercises  forearm extensor stretch 3x 30 sec     Other elbow exercises  thenar opening stretch       Shoulder Exercises: Stretch   Other Shoulder  Stretches  left upper trap stretch 3x 30 sec left       Manual Therapy   Soft tissue mobilization  left thenar eminence    Myofascial Release  Addaday instrument assisted to thenar eminence, forearm extensors, triceps, lateral and posterior deltoid and left upper trap       Trigger Point Dry Needling - 12/07/18 0001    Consent Given?  Yes    Opponens pollicis Response  Twitch response elicited;Palpable increased muscle length    Flexor pollicis brevis Response  Twitch response elicited;Palpable increased muscle length  PT Short Term Goals - 12/07/18 2155      PT SHORT TERM GOAL #1   Title  Pt ind in initial HEP    Status  Achieved      PT SHORT TERM GOAL #2   Title  Pt will report reduced pain in Lt UE by at least 50% with ADLs    Time  4    Period  Weeks    Status  On-going      PT SHORT TERM GOAL #3   Title  decreased pain in R hand by 50% with ADLS    Time  4    Period  Weeks    Status  On-going        PT Long Term Goals - 11/11/18 2143      PT LONG TERM GOAL #1   Title  Pt will be ind in advanced HEP and understand how to safely progress.    Time  8    Period  Weeks    Status  New    Target Date  01/06/19      PT LONG TERM GOAL #2   Title  Pt will achieve Lt grip strength within at least 10 deg of her dominant Rt hand.    Baseline  20 deg discrepancy at eval    Time  8    Period  Weeks    Status  New    Target Date  01/06/19      PT LONG TERM GOAL #3   Title  Pt will achieve at least 4+/5 strength rating throughout Lt thumb, wrist, elbow and shoulder to improve functional use of Lt UE.    Time  8    Period  Weeks    Status  New    Target Date  01/06/19      PT LONG TERM GOAL #4   Title  Pt will report improvement in pain with daily tasks by at least 75%.    Time  8    Period  Weeks    Status  New    Target Date  01/06/19      PT LONG TERM GOAL #5   Title  -            Plan - 12/07/18 2152    Clinical Impression  Statement  The patient reports she has had temporary relief from dry needling and manual therapy.  She reports symptoms start in her left thenar eminence but will radiate to her lateral forearm and to her lateral shoulder.  Improved soft tissue mobility following treatment session and with pain relief reported.  Therapist closely monitoring response with all treatment interventions.    Personal Factors and Comorbidities  Time since onset of injury/illness/exacerbation    Examination-Activity Limitations  Lift;Carry;Reach Overhead    Examination-Participation Restrictions  Cleaning;Shop    Stability/Clinical Decision Making  Stable/Uncomplicated    Rehab Potential  Good    PT Frequency  2x / week    PT Duration  8 weeks    PT Treatment/Interventions  ADLs/Self Care Home Management;Cryotherapy;Electrical Stimulation;Iontophoresis 4mg /ml Dexamethasone;Moist Heat;Functional mobility training;Therapeutic activities;Therapeutic exercise;Neuromuscular re-education;Manual techniques;Patient/family education;Passive range of motion;Dry needling;Taping;Joint Manipulations;Spinal Manipulations    PT Next Visit Plan  check further progress with STGs;  assess response to Dn left thumb and manual therapy with Addaday to UE; glenohumeral joint mobs;  neural gliding;   add open books       Patient will benefit from skilled therapeutic intervention in order to  improve the following deficits and impairments:  Decreased range of motion, Impaired UE functional use, Increased muscle spasms, Decreased activity tolerance, Pain, Hypomobility, Impaired flexibility, Improper body mechanics, Decreased mobility, Decreased strength, Postural dysfunction  Visit Diagnosis: 1. Thumb pain, left   2. Pain in left elbow   3. Chronic left shoulder pain        Problem List Patient Active Problem List   Diagnosis Date Noted  . S/P breast reconstruction, bilateral 03/16/2018  . History of breast cancer in female 01/27/2018  .  History of reconstruction of both breasts 01/27/2018  . Acquired absence of right breast 09/23/2017  . Breast cancer ([place]) 06/22/2017  . 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 Genetic testing 04/17/2017  . DJD (degenerative joint disease) of knee 03/30/2013  . Witnessed episode of apnea 03/23/2013  . 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 Weakness 11/28/2011  . Hypertension 11/28/2011  . Hypokalemia 11/28/2011  . LUE weakness 11/28/2011  . Hypothyroidism 11/28/2011  . Thyroid cancer ([place]) 04/28/2010   Ruben Im, PT 12/07/18 9:57 PM Phone: [phone] Fax: [phone] Alvera Singh 12/07/2018, 9:57 PM  [facility] [address] [place], [place], [address] Phone: [phone]   Fax:  [phone]  Name: [name] MRN: [id number] Date of Birth: Mar 21, 1951

## 2018-12-14 ENCOUNTER — Ambulatory Visit: Payer: Medicare Other | Admitting: Physical Therapy

## 2018-12-16 ENCOUNTER — Telehealth: Payer: Self-pay

## 2018-12-16 ENCOUNTER — Ambulatory Visit: Payer: Medicare Other | Admitting: Physical Therapy

## 2018-12-16 NOTE — Telephone Encounter (Signed)

## 2018-12-17 ENCOUNTER — Encounter: Payer: Self-pay | Admitting: Plastic Surgery

## 2018-12-17 ENCOUNTER — Ambulatory Visit (INDEPENDENT_AMBULATORY_CARE_PROVIDER_SITE_OTHER): Payer: Medicare Other | Admitting: Plastic Surgery

## 2018-12-17 ENCOUNTER — Other Ambulatory Visit: Payer: Self-pay

## 2018-12-17 VITALS — BP 132/83 | HR 85 | Temp 97.8°F | Ht 64.0 in | Wt 198.0 lb

## 2018-12-17 DIAGNOSIS — Z9882 Breast implant status: Secondary | ICD-10-CM | POA: Diagnosis not present

## 2018-12-17 DIAGNOSIS — Z9889 Other specified postprocedural states: Secondary | ICD-10-CM | POA: Diagnosis not present

## 2018-12-17 DIAGNOSIS — S01312A Laceration without foreign body of left ear, initial encounter: Secondary | ICD-10-CM | POA: Insufficient documentation

## 2018-12-17 DIAGNOSIS — Z9011 Acquired absence of right breast and nipple: Secondary | ICD-10-CM

## 2018-12-17 NOTE — Progress Notes (Signed)
   Subjective:    Patient ID: [name], female    DOB: 04-01-1951, 68 y.o.   MRN: [id number]  The patient is a 68 year old black female here for a follow-up on her breast reconstruction.  She underwent bilateral expander and then implant placement with a right latissimus muscle flap.  Overall she is very happy with her results.  The left is slightly larger than the right.  Both of the implants have settled down as they were riding very high at her last visit.  She is very pleased with the progress.  She is now interested in symmetry surgery and nipple areole a tattoo placement.  She also has a split left earlobe and would like to have that repaired as well.  She has a little bit of excess fat laterally on each breast.  It is worse on the left than the right.  This may be related to her recent weight loss.  I do not feel any lumps or bumps.  All incisions have healed very nicely.   Review of Systems  Constitutional: Negative.  Negative for activity change and appetite change.  HENT: Negative.   Eyes: Negative.   Respiratory: Negative.   Cardiovascular: Negative.   Endocrine: Negative.   Genitourinary: Negative.   Hematological: Negative.   Psychiatric/Behavioral: Negative.        Objective:   Physical Exam Vitals signs and nursing note reviewed.  Constitutional:      Appearance: Normal appearance.  HENT:     Head: Normocephalic and atraumatic.  Cardiovascular:     Rate and Rhythm: Normal rate.  Pulmonary:     Effort: Pulmonary effort is normal.  Abdominal:     General: Abdomen is flat. There is no distension.     Tenderness: There is no abdominal tenderness.  Neurological:     General: No focal deficit present.     Mental Status: She is alert and oriented to person, place, and time.  Psychiatric:        Mood and Affect: Mood normal.        Behavior: Behavior normal.        Assessment & Plan:     ICD-10-CM   1. S/P breast reconstruction, bilateral  Z98.890   2.  History of reconstruction of both breasts  Z98.82   3. Acquired absence of right breast  Z90.11     Plan for fat grafting bilateral breasts for symmetry with liposuction of the lateral breast for improved contour.  We will get her on the schedule for nipple areole a tattoo in the office.  Also schedule her separately for a left torn earlobe repair. Pictures were obtained of the patient and placed in the chart with the patient's or guardian's permission.

## 2018-12-21 ENCOUNTER — Other Ambulatory Visit: Payer: Self-pay

## 2018-12-21 ENCOUNTER — Encounter: Payer: Self-pay | Admitting: Physical Therapy

## 2018-12-21 ENCOUNTER — Ambulatory Visit: Payer: Medicare Other | Admitting: Physical Therapy

## 2018-12-21 DIAGNOSIS — M25522 Pain in left elbow: Secondary | ICD-10-CM

## 2018-12-21 DIAGNOSIS — M79645 Pain in left finger(s): Secondary | ICD-10-CM

## 2018-12-21 NOTE — Therapy (Addendum)
[facility] [address], [place], [place], [address] Phone: [phone]   Fax:  [phone]  Physical Therapy Treatment/Discharge Summary   Patient Details  Name: [name] MRN: [id number] Date of Birth: 1950-11-23 Referring Provider (PT): Renette Butters, MD   Encounter Date: 12/21/2018  PT End of Session - 12/21/18 1929    Visit Number  4    Number of Visits  12    Date for PT Re-Evaluation  01/06/19    Authorization Type  [employer], pre-cert required  12 visits approved    Authorization - Visit Number  12    Authorization - Number of Visits  4    PT Start Time  1350    PT Stop Time  1962   pt defers treatment; going back for testing   PT Time Calculation (min)  35 min    Activity Tolerance  Patient tolerated treatment well       Past Medical History:  Diagnosis Date  . Anemia    "years ago"  . Arthritis    "left hand; was in my knees" (09/24/2017)  . Breast cancer, right breast ([place]) 2004  . Bruises easily   . Chronic lower back pain   . Ductal carcinoma in situ (DCIS) of left breast 2018  . Genetic testing 04/17/2017   STAT Breast panel with reflex to Multi-Cancer panel (83 genes) @ [facility] - No pathogenic mutations detected  . History of bronchitis    when she was young  . Hypertension    no longer taking medications as of August 2019  . Hypothyroidism    takes Synthroid daily  . Joint pain   . Joint swelling   . Personal history of chemotherapy 04/2003  . Personal history of radiation therapy 2005  . Pneumonia    hx of when she was young  . Right elbow tendonitis   . Sleep apnea    has a machine but does not use (09/24/2017)  . Thyroid cancer ([place]) 2012  . Urinary frequency     Past Surgical History:  Procedure Laterality Date  . ANTERIOR CERVICAL DECOMP/DISCECTOMY FUSION  2008  . APPENDECTOMY  2007  . BREAST BIOPSY Right 02/28/2003   malignant  . BREAST BIOPSY Left 03/2017  .  BREAST LUMPECTOMY Right 2004  . BREAST RECONSTRUCTION WITH PLACEMENT OF TISSUE EXPANDER AND FLEX HD (ACELLULAR HYDRATED DERMIS) Bilateral 06/22/2017   Procedure: IMMEDIATE BILATERAL BREAST RECONSTRUCTION WITH PLACEMENT OF TISSUE EXPANDER AND FLEX HD (ACELLULAR HYDRATED DERMIS);  Surgeon: Wallace Going, DO;  Location: [place];  Service: Plastics;  Laterality: Bilateral;  . CESAREAN SECTION  1981  . COLONOSCOPY  X 2  . JOINT REPLACEMENT    . KNEE ARTHROSCOPY Right 2007  . KNEE ARTHROSCOPY  05/02/2011   Procedure: ARTHROSCOPY KNEE;  Surgeon: Johnn Hai;  Location: [place];  Service: Orthopedics;  Laterality: Right;  . LATISSIMUS FLAP TO BREAST Right 09/23/2017   Procedure: RIGHT BREAST LATISSIMUS DORSI FLAP RECONSTRUCTION WITH TISSUE EXPANDER;  Surgeon: Wallace Going, DO;  Location: [place];  Service: Plastics;  Laterality: Right;  . MASTECTOMY W/ SENTINEL NODE BIOPSY Bilateral 06/22/2017   Procedure: LEFT MASTECTOMY WITH SENTINEL LYMPH NODE BIOPSY AND RIGHT PROPHYLACTIC MASTECTOMY;  Surgeon: Jovita Kussmaul, MD;  Location: [place];  Service: General;  Laterality: Bilateral;  . MENISCUS DEBRIDEMENT  05/02/2011   Procedure: DEBRIDEMENT OF MENISCUS;  Surgeon: Johnn Hai;  Location:  [place];  Service: Orthopedics;  Laterality: Right;  . PORTA CATH REMOVAL  2005  . PORTACATH PLACEMENT  2004  . REMOVAL OF BILATERAL TISSUE EXPANDERS WITH PLACEMENT OF BILATERAL BREAST IMPLANTS Bilateral 05/26/2018   Procedure: REMOVAL OF BILATERAL TISSUE EXPANDERS WITH PLACEMENT OF BILATERAL BREAST IMPLANTS;  Surgeon: Wallace Going, DO;  Location: [place];  Service: Plastics;  Laterality: Bilateral;  . REMOVAL OF TISSUE EXPANDER AND PLACEMENT OF IMPLANT Right 12/12/2017   Procedure: REMOVAL OF TISSUE EXPANDER, RIGHT;  Surgeon: Irene Limbo, MD;  Location: [place];  Service: Plastics;  Laterality: Right;  .  THYROIDECTOMY N/A 09/22/2012   Procedure: RIGHT THYROID LOBECTOMY WITH FROZEN SECTION;  Surgeon: Izora Gala, MD;  Location: [place];  Service: ENT;  Laterality: N/A;  . THYROIDECTOMY Left 10/21/2012   Procedure: COMPLETION OF THYROIDECTOMY;  Surgeon: Izora Gala, MD;  Location: [place];  Service: ENT;  Laterality: Left;  . TISSUE EXPANDER PLACEMENT Right 03/03/2018   Procedure: TISSUE EXPANDER PLACEMENT;  Surgeon: Wallace Going, DO;  Location: [place];  Service: Plastics;  Laterality: Right;  . TOTAL KNEE ARTHROPLASTY  04/14/2012   Procedure: TOTAL KNEE ARTHROPLASTY;  Surgeon: Ninetta Lights, MD;  Location: [place];  Service: Orthopedics;  Laterality: Right;  RIGHT ARTHROPLASTY KNEE MEDIAL/LATERAL COMPARTMENTS WITH PATELLA RESURFACING  . TOTAL KNEE ARTHROPLASTY Left 03/30/2013   Procedure: TOTAL KNEE ARTHROPLASTY;  Surgeon: Ninetta Lights, MD;  Location: [place];  Service: Orthopedics;  Laterality: Left;    There were no vitals filed for this visit.  Subjective Assessment - 12/21/18 1352    Subjective  I think surgery is inevitable.  They are going to send me for an MRI.  Now I feel it in my 5th finger especially while driving and at night.  Discomfort in left neck but it does not seem to trigger the hand.  PT was helping but now with the new problem...  Waiting for Dr. Percell Miller to send referral for MRI.  I want to know what's going on.    Pertinent History  breast reconstruction;  2008 anterior cervical discectomy per patient report;  TKR    Currently in Pain?  Yes    Pain Score  5     Pain Location  Hand    Pain Orientation  Left    Aggravating Factors   UE use         [facility] PT Assessment - 12/21/18 0001      AROM   Overall AROM Comments  Lt shoulder WFL with end range capsular stiffness, Lt elbow WFL without pain, Lt wrist WFL without pain                   [facility] Adult PT Treatment/Exercise - 12/21/18 0001      Self-Care   Posture  sleep positioning to  avoid wrist flexion and elbow flexion     Heat/Ice Application  use of home massager (patient brought in) on thenar eminences and forearm muscles     Other Self-Care Comments   discussed use of wrist brace at night and while working;  use of paraffin, not leaning on elbow to avoid neural compromise       Neck Exercises: Seated   Other Seated Exercise  cervical ROM: flex, extension, sidebending, rotation                PT Short Term Goals - 12/07/18 2155      PT SHORT  TERM GOAL #1   Title  Pt ind in initial HEP    Status  Achieved      PT SHORT TERM GOAL #2   Title  Pt will report reduced pain in Lt UE by at least 50% with ADLs    Time  4    Period  Weeks    Status  On-going      PT SHORT TERM GOAL #3   Title  decreased pain in R hand by 50% with ADLS    Time  4    Period  Weeks    Status  On-going        PT Long Term Goals - 11/11/18 2143      PT LONG TERM GOAL #1   Title  Pt will be ind in advanced HEP and understand how to safely progress.    Time  8    Period  Weeks    Status  New    Target Date  01/06/19      PT LONG TERM GOAL #2   Title  Pt will achieve Lt grip strength within at least 10 deg of her dominant Rt hand.    Baseline  20 deg discrepancy at eval    Time  8    Period  Weeks    Status  New    Target Date  01/06/19      PT LONG TERM GOAL #3   Title  Pt will achieve at least 4+/5 strength rating throughout Lt thumb, wrist, elbow and shoulder to improve functional use of Lt UE.    Time  8    Period  Weeks    Status  New    Target Date  01/06/19      PT LONG TERM GOAL #4   Title  Pt will report improvement in pain with daily tasks by at least 75%.    Time  8    Period  Weeks    Status  New    Target Date  01/06/19      PT LONG TERM GOAL #5   Title  -            Plan - 12/21/18 1930    Clinical Impression Statement  The patient reports she is awaiting to be scheduled for an MRI to determine if her neck could be a contributing  factor to her UE symptoms.  She would like to hold PT while she undergoes testing.  We discussed basic self care strategies for support and pain relief.  MInimal progress toward goals at this point.  Patient will call or email to update on status.  She hopes to complete testing before she goes to visit her mother for 2 weeks in the middle of September.    Rehab Potential  Good    PT Frequency  2x / week    PT Duration  8 weeks    PT Treatment/Interventions  ADLs/Self Care Home Management;Cryotherapy;Electrical Stimulation;Iontophoresis '4mg'$ /ml Dexamethasone;Moist Heat;Functional mobility training;Therapeutic activities;Therapeutic exercise;Neuromuscular re-education;Manual techniques;Patient/family education;Passive range of motion;Dry needling;Taping;Joint Manipulations;Spinal Manipulations    PT Next Visit Plan  Hold PT at patient's request while she has further diagnostic testing;  she is going to [place] to see her mother in mid Sept.       Patient will benefit from skilled therapeutic intervention in order to improve the following deficits and impairments:  Decreased range of motion, Impaired UE functional use, Increased muscle spasms, Decreased activity tolerance, Pain, Hypomobility, Impaired flexibility, Improper  body mechanics, Decreased mobility, Decreased strength, Postural dysfunction  Visit Diagnosis: Thumb pain, left  Pain in left elbow  PHYSICAL THERAPY DISCHARGE SUMMARY  Visits from Start of Care: 6  Current functional level related to goals / functional outcomes: The patient cancelled last appts for traveling out of town and then wanting to see how she does on her own for a while.  She has not returned in the last 2 months.  Will discharge from PT at this time.     Remaining deficits: As above   Education / Equipment: Self care, HEP  Plan: Patient agrees to discharge.  Patient goals were not met. Patient is being discharged due to not returning since the last visit.  ?????          Problem List Patient Active Problem List   Diagnosis Date Noted  . Torn earlobe, left, initial encounter 12/17/2018  . S/P breast reconstruction, bilateral 03/16/2018  . History of breast cancer in female 01/27/2018  . History of reconstruction of both breasts 01/27/2018  . Acquired absence of right breast 09/23/2017  . Breast cancer ([place]) 06/22/2017  . 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 Genetic testing 04/17/2017  . DJD (degenerative joint disease) of knee 03/30/2013  . Witnessed episode of apnea 03/23/2013  . 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 Weakness 11/28/2011  . Hypertension 11/28/2011  . Hypokalemia 11/28/2011  . LUE weakness 11/28/2011  . Hypothyroidism 11/28/2011  . Thyroid cancer ([place]) 04/28/2010   Ruben Im, PT 12/21/18 7:35 PM Phone: [phone] Fax: [phone] Alvera Singh 12/21/2018, 7:35 PM  [facility] [address] [place], [place], [address] Phone: [phone]   Fax:  [phone]  Name: [name] MRN: [id number] Date of Birth: 1951-03-12

## 2018-12-23 ENCOUNTER — Ambulatory Visit: Payer: Medicare Other | Admitting: Physical Therapy

## 2018-12-30 ENCOUNTER — Encounter: Payer: Commercial Indemnity | Admitting: Physical Therapy

## 2019-01-06 ENCOUNTER — Encounter: Payer: Commercial Indemnity | Admitting: Physical Therapy

## 2019-01-13 ENCOUNTER — Encounter: Payer: Commercial Indemnity | Admitting: Physical Therapy

## 2019-01-21 ENCOUNTER — Encounter: Payer: Commercial Indemnity | Admitting: Surgical

## 2019-01-31 ENCOUNTER — Other Ambulatory Visit (HOSPITAL_COMMUNITY): Payer: Commercial Indemnity

## 2019-02-01 ENCOUNTER — Encounter: Payer: Self-pay | Admitting: Surgical

## 2019-02-01 ENCOUNTER — Other Ambulatory Visit: Payer: Self-pay

## 2019-02-01 ENCOUNTER — Ambulatory Visit (INDEPENDENT_AMBULATORY_CARE_PROVIDER_SITE_OTHER): Payer: Medicare Other | Admitting: Surgical

## 2019-02-01 VITALS — BP 137/84 | HR 92 | Temp 97.7°F | Ht 64.0 in | Wt 197.6 lb

## 2019-02-01 DIAGNOSIS — Z9889 Other specified postprocedural states: Secondary | ICD-10-CM

## 2019-02-01 MED ORDER — ONDANSETRON HCL 4 MG PO TABS: 4.0000 mg | ORAL_TABLET | Freq: Three times a day (TID) | ORAL | 0 refills | Status: DC | PRN

## 2019-02-01 MED ORDER — HYDROCODONE-ACETAMINOPHEN 5-325 MG PO TABS: 1.0000 | ORAL_TABLET | Freq: Four times a day (QID) | ORAL | 0 refills | Status: AC | PRN

## 2019-02-01 MED ORDER — CEPHALEXIN 500 MG PO CAPS: 500.0000 mg | ORAL_CAPSULE | Freq: Two times a day (BID) | ORAL | 0 refills | Status: AC

## 2019-02-01 NOTE — Progress Notes (Signed)
Patient ID: [name], female    DOB: [birth year], 68 y.o.   MRN: [id number]  Chief Complaint  Patient presents with  . Pre-op Exam    (B) lipophilic of breast and liposution of lateral breast for symmetry      ICD-10-CM   1. S/P breast reconstruction, bilateral  Z98.890      History of Present Illness: [name] is a 68 y.o.  female  with a history of bilateral breast reconstruction, implant placement and right latissimus muscle flap.  She presents for preoperative evaluation for upcoming procedure, bilateral lipofilling of breasts and liposuction of lateral breasts for symmetry, scheduled for 02/24/19 with Dr. Marla Roe.  Left breast is larger than right. She is very happy with her progress and has healed really well. She has lost ~ 20 lbs.   The patient has not had problems with anesthesia. No recent colds or illness. HTN is well controlled. No hx of dvt/pe. No fmhx of dvt/pe. Non-smoker. No anticoagulants or antiplatelets.   She has a history significant for hypertension, mild OSA, hypothyroidism.     Past Medical History: Allergies: No Known Allergies  Current Medications:  Current Outpatient Medications:  .  chlorthalidone (HYGROTON) 25 MG tablet, Take 25 mg by mouth daily., Disp: , Rfl:  .  cholecalciferol (VITAMIN D) 1000 units tablet, Take 1,000 Units by mouth daily., Disp: , Rfl:  .  diclofenac sodium (VOLTAREN) 1 % GEL, As needed., Disp: , Rfl:  .  ibuprofen (ADVIL,MOTRIN) 600 MG tablet, As needed., Disp: , Rfl:  .  meloxicam (MOBIC) 7.5 MG tablet, TAKE 1 TABLET BY MOUTH ONCE A DAY AS NEEDED FOR PAIN TAKE WITH GASTRIC PROTECTION, Disp: , Rfl:  .  penicillin v potassium (VEETID) 500 MG tablet, TAKE 1 TABLET EVERY 6 HOURS UNTIL ALL ARE TAKEN, Disp: , Rfl:  .  potassium chloride SA (K-DUR,KLOR-CON) 20 MEQ tablet, Take 20 mEq by mouth 3 (three) times daily., Disp: , Rfl:  .  SYNTHROID 100 MCG tablet, Take 1 tablet by mouth daily., Disp: , Rfl:  .   SYNTHROID 88 MCG tablet, TAKE 1 TABLET IN THE MORNING ON AN EMPTY STOMACH SAT  SUNDAY, Disp: , Rfl:   Past Medical Problems: Past Medical History:  Diagnosis Date  . Anemia    "years ago"  . Arthritis    "left hand; was in my knees" (09/24/2017)  . Breast cancer, right breast ([place]) 2004  . Bruises easily   . Chronic lower back pain   . Ductal carcinoma in situ (DCIS) of left breast 2018  . Genetic testing 04/17/2017   STAT Breast panel with reflex to Multi-Cancer panel (83 genes) @ [facility] - No pathogenic mutations detected  . History of bronchitis    when she was young  . Hypertension    no longer taking medications as of August 2019  . Hypothyroidism    takes Synthroid daily  . Joint pain   . Joint swelling   . Personal history of chemotherapy 04/2003  . Personal history of radiation therapy 2005  . Pneumonia    hx of when she was young  . Right elbow tendonitis   . Sleep apnea    has a machine but does not use (09/24/2017)  . Thyroid cancer ([place]) 2012  . Urinary frequency     Past Surgical History: Past Surgical History:  Procedure Laterality Date  . ANTERIOR CERVICAL DECOMP/DISCECTOMY FUSION  2008  . APPENDECTOMY  2007  . BREAST  BIOPSY Right 02/28/2003   malignant  . BREAST BIOPSY Left 03/2017  . BREAST LUMPECTOMY Right 2004  . BREAST RECONSTRUCTION WITH PLACEMENT OF TISSUE EXPANDER AND FLEX HD (ACELLULAR HYDRATED DERMIS) Bilateral 06/22/2017   Procedure: IMMEDIATE BILATERAL BREAST RECONSTRUCTION WITH PLACEMENT OF TISSUE EXPANDER AND FLEX HD (ACELLULAR HYDRATED DERMIS);  Surgeon: Wallace Going, DO;  Location: [place];  Service: Plastics;  Laterality: Bilateral;  . CESAREAN SECTION  1981  . COLONOSCOPY  X 2  . JOINT REPLACEMENT    . KNEE ARTHROSCOPY Right 2007  . KNEE ARTHROSCOPY  05/02/2011   Procedure: ARTHROSCOPY KNEE;  Surgeon: Johnn Hai;  Location: [facility];  Service: Orthopedics;  Laterality: Right;  . LATISSIMUS  FLAP TO BREAST Right 09/23/2017   Procedure: RIGHT BREAST LATISSIMUS DORSI FLAP RECONSTRUCTION WITH TISSUE EXPANDER;  Surgeon: Wallace Going, DO;  Location: [place];  Service: Plastics;  Laterality: Right;  . MASTECTOMY W/ SENTINEL NODE BIOPSY Bilateral 06/22/2017   Procedure: LEFT MASTECTOMY WITH SENTINEL LYMPH NODE BIOPSY AND RIGHT PROPHYLACTIC MASTECTOMY;  Surgeon: Jovita Kussmaul, MD;  Location: [place];  Service: General;  Laterality: Bilateral;  . MENISCUS DEBRIDEMENT  05/02/2011   Procedure: DEBRIDEMENT OF MENISCUS;  Surgeon: Johnn Hai;  Location: [place];  Service: Orthopedics;  Laterality: Right;  . PORTA CATH REMOVAL  2005  . PORTACATH PLACEMENT  2004  . REMOVAL OF BILATERAL TISSUE EXPANDERS WITH PLACEMENT OF BILATERAL BREAST IMPLANTS Bilateral 05/26/2018   Procedure: REMOVAL OF BILATERAL TISSUE EXPANDERS WITH PLACEMENT OF BILATERAL BREAST IMPLANTS;  Surgeon: Wallace Going, DO;  Location: [place];  Service: Plastics;  Laterality: Bilateral;  . REMOVAL OF TISSUE EXPANDER AND PLACEMENT OF IMPLANT Right 12/12/2017   Procedure: REMOVAL OF TISSUE EXPANDER, RIGHT;  Surgeon: Irene Limbo, MD;  Location: [place];  Service: Plastics;  Laterality: Right;  . THYROIDECTOMY N/A 09/22/2012   Procedure: RIGHT THYROID LOBECTOMY WITH FROZEN SECTION;  Surgeon: Izora Gala, MD;  Location: [place];  Service: ENT;  Laterality: N/A;  . THYROIDECTOMY Left 10/21/2012   Procedure: COMPLETION OF THYROIDECTOMY;  Surgeon: Izora Gala, MD;  Location: [place];  Service: ENT;  Laterality: Left;  . TISSUE EXPANDER PLACEMENT Right 03/03/2018   Procedure: TISSUE EXPANDER PLACEMENT;  Surgeon: Wallace Going, DO;  Location: [place];  Service: Plastics;  Laterality: Right;  . TOTAL KNEE ARTHROPLASTY  04/14/2012   Procedure: TOTAL KNEE ARTHROPLASTY;  Surgeon: Ninetta Lights, MD;  Location: [place];  Service: Orthopedics;  Laterality: Right;   RIGHT ARTHROPLASTY KNEE MEDIAL/LATERAL COMPARTMENTS WITH PATELLA RESURFACING  . TOTAL KNEE ARTHROPLASTY Left 03/30/2013   Procedure: TOTAL KNEE ARTHROPLASTY;  Surgeon: Ninetta Lights, MD;  Location: [place];  Service: Orthopedics;  Laterality: Left;    Social History: Social History   Socioeconomic History  . Marital status: Single    Spouse name: Not on file  . Number of children: Not on file  . Years of education: Not on file  . Highest education level: Not on file  Occupational History  . Not on file  Social Needs  . Financial resource strain: Not on file  . Food insecurity    Worry: Not on file    Inability: Not on file  . Transportation needs    Medical: Not on file    Non-medical: Not on file  Tobacco Use  . Smoking status: Never Smoker  . Smokeless tobacco: Never Used  Substance and  Sexual Activity  . Alcohol use: Yes    Alcohol/week: 3.0 standard drinks    Types: 3 Glasses of wine per week    Comment: social  . Drug use: Not Currently  . Sexual activity: Not Currently    Birth control/protection: Post-menopausal  Lifestyle  . Physical activity    Days per week: Not on file    Minutes per session: Not on file  . Stress: Not on file  Relationships  . Social Herbalist on phone: Not on file    Gets together: Not on file    Attends religious service: Not on file    Active member of club or organization: Not on file    Attends meetings of clubs or organizations: Not on file    Relationship status: Not on file  . Intimate partner violence    Fear of current or ex partner: Not on file    Emotionally abused: Not on file    Physically abused: Not on file    Forced sexual activity: Not on file  Other Topics Concern  . Not on file  Social History Narrative   Lives with Grandson at present    Family History: Family History  Problem Relation Age of Onset  . Breast cancer Mother 21       currently 74  . Liver cancer Father   . Breast cancer Other         pat grandfather's sister; dx 63s    Review of Systems: Review of Systems  Constitutional: Positive for weight loss. Negative for chills, diaphoresis, fever and malaise/fatigue.  Respiratory: Negative.   Cardiovascular: Negative.   Gastrointestinal: Negative for abdominal pain, nausea and vomiting.  Genitourinary: Negative.   Musculoskeletal: Negative.   Skin: Negative for itching and rash.  Neurological: Negative for dizziness, weakness and headaches.    Physical Exam: Vital Signs BP 137/84 (BP Location: Left Arm, Patient Position: Sitting, Cuff Size: Large)   Pulse 92   Temp 97.7 F (36.5 C) (Temporal)   Ht 5\' 4"  (1.626 m)   Wt 197 lb 9.6 oz (89.6 kg)   SpO2 94%   BMI 33.92 kg/m   Physical Exam Exam conducted with a chaperone present.  Constitutional:      General: She is not in acute distress.    Appearance: Normal appearance. She is not ill-appearing.  HENT:     Head: Normocephalic and atraumatic.  Eyes:     Pupils: Pupils are equal, round Neck:     Musculoskeletal: Normal range of motion.  Cardiovascular:     Rate and Rhythm: Normal rate and regular rhythm.     Pulses: Normal pulses.     Heart sounds: Normal heart sounds. No murmur.  Breast: Right latissimus flap, good capillary refill. Left breast > Right breast (L 700cc, R 450 cc) Pulmonary:     Effort: Pulmonary effort is normal. No respiratory distress.     Breath sounds: Normal breath sounds. No wheezing.  Abdominal:     General: Abdomen is flat. There is no distension.     Palpations: Abdomen is soft.     Tenderness: There is no abdominal tenderness.  Musculoskeletal: Normal range of motion.  Skin:    General: Skin is warm and dry.     Findings: No erythema or rash.  Neurological:     General: No focal deficit present.     Mental Status: She is alert and oriented to person, place, and time. Mental status is at  baseline.     Motor: No weakness.  Psychiatric:        Mood and Affect: Mood normal.         Behavior: Behavior normal.    Assessment/Plan: The patient is scheduled forbilateral lipofilling of breasts and liposuction of lateral breasts for symmetry, scheduled for 02/24/19 with Dr. Marla Roe.  The consent was obtained with risks and complications reviewed which included bleeding, pain, scar, infection and the risk of anesthesia.  The patients questions were answered to the patients expressed satisfaction.  The risks that can be encountered with and after liposuction were discussed and include the following but no limited to these:  Asymmetry, fluid accumulation, firmness of the area, fat necrosis with death of fat tissue, bleeding, infection, delayed healing, anesthesia risks, skin sensation changes, injury to structures including nerves, blood vessels, and muscles which may be temporary or permanent, allergies to tape, suture materials and glues, blood products, topical preparations or injected agents, skin and contour irregularities, skin discoloration and swelling, deep vein thrombosis, cardiac and pulmonary complications, pain, which may persist, persistent pain, recurrence of the lesion, poor healing of the incision, possible need for revisional surgery or staged procedures. Thiere can also be persistent swelling, poor wound healing, rippling or loose skin, worsening of cellulite, swelling, and thermal burn or heat injury from ultrasound with the ultrasound-assisted lipoplasty technique. Any change in weight fluctuations can alter the outcome.   Prescription sent to pharmacy. She has been doing well recently, generally healthy.  Electronically signed by: Carola Rhine Torian Thoennes, PA-C 02/01/2019 2:35 PM

## 2019-02-01 NOTE — H&P (View-Only) (Signed)
Patient ID: [name], female    DOB: 09/27/1950, 68 y.o.   MRN: [id number]  Chief Complaint  Patient presents with  . Pre-op Exam    (B) lipophilic of breast and liposution of lateral breast for symmetry      ICD-10-CM   1. S/P breast reconstruction, bilateral  Z98.890      History of Present Illness: [name] is a 68 y.o.  female  with a history of bilateral breast reconstruction, implant placement and right latissimus muscle flap.  She presents for preoperative evaluation for upcoming procedure, bilateral lipofilling of breasts and liposuction of lateral breasts for symmetry, scheduled for 02/24/19 with Dr. Marla Roe.  Left breast is larger than right. She is very happy with her progress and has healed really well. She has lost ~ 20 lbs.   The patient has not had problems with anesthesia. No recent colds or illness. HTN is well controlled. No hx of dvt/pe. No fmhx of dvt/pe. Non-smoker. No anticoagulants or antiplatelets.   She has a history significant for hypertension, mild OSA, hypothyroidism.     Past Medical History: Allergies: No Known Allergies  Current Medications:  Current Outpatient Medications:  .  chlorthalidone (HYGROTON) 25 MG tablet, Take 25 mg by mouth daily., Disp: , Rfl:  .  cholecalciferol (VITAMIN D) 1000 units tablet, Take 1,000 Units by mouth daily., Disp: , Rfl:  .  diclofenac sodium (VOLTAREN) 1 % GEL, As needed., Disp: , Rfl:  .  ibuprofen (ADVIL,MOTRIN) 600 MG tablet, As needed., Disp: , Rfl:  .  meloxicam (MOBIC) 7.5 MG tablet, TAKE 1 TABLET BY MOUTH ONCE A DAY AS NEEDED FOR PAIN TAKE WITH GASTRIC PROTECTION, Disp: , Rfl:  .  penicillin v potassium (VEETID) 500 MG tablet, TAKE 1 TABLET EVERY 6 HOURS UNTIL ALL ARE TAKEN, Disp: , Rfl:  .  potassium chloride SA (K-DUR,KLOR-CON) 20 MEQ tablet, Take 20 mEq by mouth 3 (three) times daily., Disp: , Rfl:  .  SYNTHROID 100 MCG tablet, Take 1 tablet by mouth daily., Disp: , Rfl:  .   SYNTHROID 88 MCG tablet, TAKE 1 TABLET IN THE MORNING ON AN EMPTY STOMACH SAT  SUNDAY, Disp: , Rfl:   Past Medical Problems: Past Medical History:  Diagnosis Date  . Anemia    "years ago"  . Arthritis    "left hand; was in my knees" (09/24/2017)  . Breast cancer, right breast ([place]) 2004  . Bruises easily   . Chronic lower back pain   . Ductal carcinoma in situ (DCIS) of left breast 2018  . Genetic testing 04/17/2017   STAT Breast panel with reflex to Multi-Cancer panel (83 genes) @ [facility] - No pathogenic mutations detected  . History of bronchitis    when she was young  . Hypertension    no longer taking medications as of August 2019  . Hypothyroidism    takes Synthroid daily  . Joint pain   . Joint swelling   . Personal history of chemotherapy 04/2003  . Personal history of radiation therapy 2005  . Pneumonia    hx of when she was young  . Right elbow tendonitis   . Sleep apnea    has a machine but does not use (09/24/2017)  . Thyroid cancer ([place]) 2012  . Urinary frequency     Past Surgical History: Past Surgical History:  Procedure Laterality Date  . ANTERIOR CERVICAL DECOMP/DISCECTOMY FUSION  2008  . APPENDECTOMY  2007  . BREAST  BIOPSY Right 02/28/2003   malignant  . BREAST BIOPSY Left 03/2017  . BREAST LUMPECTOMY Right 2004  . BREAST RECONSTRUCTION WITH PLACEMENT OF TISSUE EXPANDER AND FLEX HD (ACELLULAR HYDRATED DERMIS) Bilateral 06/22/2017   Procedure: IMMEDIATE BILATERAL BREAST RECONSTRUCTION WITH PLACEMENT OF TISSUE EXPANDER AND FLEX HD (ACELLULAR HYDRATED DERMIS);  Surgeon: Wallace Going, DO;  Location: [facility];  Service: Plastics;  Laterality: Bilateral;  . CESAREAN SECTION  1981  . COLONOSCOPY  X 2  . JOINT REPLACEMENT    . KNEE ARTHROSCOPY Right 2007  . KNEE ARTHROSCOPY  05/02/2011   Procedure: ARTHROSCOPY KNEE;  Surgeon: Johnn Hai;  Location: [place];  Service: Orthopedics;  Laterality: Right;  . LATISSIMUS  FLAP TO BREAST Right 09/23/2017   Procedure: RIGHT BREAST LATISSIMUS DORSI FLAP RECONSTRUCTION WITH TISSUE EXPANDER;  Surgeon: Wallace Going, DO;  Location: [facility];  Service: Plastics;  Laterality: Right;  . MASTECTOMY W/ SENTINEL NODE BIOPSY Bilateral 06/22/2017   Procedure: LEFT MASTECTOMY WITH SENTINEL LYMPH NODE BIOPSY AND RIGHT PROPHYLACTIC MASTECTOMY;  Surgeon: Jovita Kussmaul, MD;  Location: [facility];  Service: General;  Laterality: Bilateral;  . MENISCUS DEBRIDEMENT  05/02/2011   Procedure: DEBRIDEMENT OF MENISCUS;  Surgeon: Johnn Hai;  Location: [facility];  Service: Orthopedics;  Laterality: Right;  . PORTA CATH REMOVAL  2005  . PORTACATH PLACEMENT  2004  . REMOVAL OF BILATERAL TISSUE EXPANDERS WITH PLACEMENT OF BILATERAL BREAST IMPLANTS Bilateral 05/26/2018   Procedure: REMOVAL OF BILATERAL TISSUE EXPANDERS WITH PLACEMENT OF BILATERAL BREAST IMPLANTS;  Surgeon: Wallace Going, DO;  Location: [place];  Service: Plastics;  Laterality: Bilateral;  . REMOVAL OF TISSUE EXPANDER AND PLACEMENT OF IMPLANT Right 12/12/2017   Procedure: REMOVAL OF TISSUE EXPANDER, RIGHT;  Surgeon: Irene Limbo, MD;  Location: [facility];  Service: Plastics;  Laterality: Right;  . THYROIDECTOMY N/A 09/22/2012   Procedure: RIGHT THYROID LOBECTOMY WITH FROZEN SECTION;  Surgeon: Izora Gala, MD;  Location: [facility];  Service: ENT;  Laterality: N/A;  . THYROIDECTOMY Left 10/21/2012   Procedure: COMPLETION OF THYROIDECTOMY;  Surgeon: Izora Gala, MD;  Location: [place];  Service: ENT;  Laterality: Left;  . TISSUE EXPANDER PLACEMENT Right 03/03/2018   Procedure: TISSUE EXPANDER PLACEMENT;  Surgeon: Wallace Going, DO;  Location: [place];  Service: Plastics;  Laterality: Right;  . TOTAL KNEE ARTHROPLASTY  04/14/2012   Procedure: TOTAL KNEE ARTHROPLASTY;  Surgeon: Ninetta Lights, MD;  Location: [place];  Service: Orthopedics;  Laterality: Right;   RIGHT ARTHROPLASTY KNEE MEDIAL/LATERAL COMPARTMENTS WITH PATELLA RESURFACING  . TOTAL KNEE ARTHROPLASTY Left 03/30/2013   Procedure: TOTAL KNEE ARTHROPLASTY;  Surgeon: Ninetta Lights, MD;  Location: [facility];  Service: Orthopedics;  Laterality: Left;    Social History: Social History   Socioeconomic History  . Marital status: Single    Spouse name: Not on file  . Number of children: Not on file  . Years of education: Not on file  . Highest education level: Not on file  Occupational History  . Not on file  Social Needs  . Financial resource strain: Not on file  . Food insecurity    Worry: Not on file    Inability: Not on file  . Transportation needs    Medical: Not on file    Non-medical: Not on file  Tobacco Use  . Smoking status: Never Smoker  . Smokeless tobacco: Never Used  Substance and  Sexual Activity  . Alcohol use: Yes    Alcohol/week: 3.0 standard drinks    Types: 3 Glasses of wine per week    Comment: social  . Drug use: Not Currently  . Sexual activity: Not Currently    Birth control/protection: Post-menopausal  Lifestyle  . Physical activity    Days per week: Not on file    Minutes per session: Not on file  . Stress: Not on file  Relationships  . Social Herbalist on phone: Not on file    Gets together: Not on file    Attends religious service: Not on file    Active member of club or organization: Not on file    Attends meetings of clubs or organizations: Not on file    Relationship status: Not on file  . Intimate partner violence    Fear of current or ex partner: Not on file    Emotionally abused: Not on file    Physically abused: Not on file    Forced sexual activity: Not on file  Other Topics Concern  . Not on file  Social History Narrative   Lives with Grandson at present    Family History: Family History  Problem Relation Age of Onset  . Breast cancer Mother 3       currently 61  . Liver cancer Father   . Breast cancer Other         pat grandfather's sister; dx 18s    Review of Systems: Review of Systems  Constitutional: Positive for weight loss. Negative for chills, diaphoresis, fever and malaise/fatigue.  Respiratory: Negative.   Cardiovascular: Negative.   Gastrointestinal: Negative for abdominal pain, nausea and vomiting.  Genitourinary: Negative.   Musculoskeletal: Negative.   Skin: Negative for itching and rash.  Neurological: Negative for dizziness, weakness and headaches.    Physical Exam: Vital Signs BP 137/84 (BP Location: Left Arm, Patient Position: Sitting, Cuff Size: Large)   Pulse 92   Temp 97.7 F (36.5 C) (Temporal)   Ht 5\' 4"  (1.626 m)   Wt 197 lb 9.6 oz (89.6 kg)   SpO2 94%   BMI 33.92 kg/m   Physical Exam Exam conducted with a chaperone present.  Constitutional:      General: She is not in acute distress.    Appearance: Normal appearance. She is not ill-appearing.  HENT:     Head: Normocephalic and atraumatic.  Eyes:     Pupils: Pupils are equal, round Neck:     Musculoskeletal: Normal range of motion.  Cardiovascular:     Rate and Rhythm: Normal rate and regular rhythm.     Pulses: Normal pulses.     Heart sounds: Normal heart sounds. No murmur.  Breast: Right latissimus flap, good capillary refill. Left breast > Right breast (L 700cc, R 450 cc) Pulmonary:     Effort: Pulmonary effort is normal. No respiratory distress.     Breath sounds: Normal breath sounds. No wheezing.  Abdominal:     General: Abdomen is flat. There is no distension.     Palpations: Abdomen is soft.     Tenderness: There is no abdominal tenderness.  Musculoskeletal: Normal range of motion.  Skin:    General: Skin is warm and dry.     Findings: No erythema or rash.  Neurological:     General: No focal deficit present.     Mental Status: She is alert and oriented to person, place, and time. Mental status is at  baseline.     Motor: No weakness.  Psychiatric:        Mood and Affect: Mood normal.         Behavior: Behavior normal.    Assessment/Plan: The patient is scheduled forbilateral lipofilling of breasts and liposuction of lateral breasts for symmetry, scheduled for 02/24/19 with Dr. Marla Roe.  The consent was obtained with risks and complications reviewed which included bleeding, pain, scar, infection and the risk of anesthesia.  The patients questions were answered to the patients expressed satisfaction.  The risks that can be encountered with and after liposuction were discussed and include the following but no limited to these:  Asymmetry, fluid accumulation, firmness of the area, fat necrosis with death of fat tissue, bleeding, infection, delayed healing, anesthesia risks, skin sensation changes, injury to structures including nerves, blood vessels, and muscles which may be temporary or permanent, allergies to tape, suture materials and glues, blood products, topical preparations or injected agents, skin and contour irregularities, skin discoloration and swelling, deep vein thrombosis, cardiac and pulmonary complications, pain, which may persist, persistent pain, recurrence of the lesion, poor healing of the incision, possible need for revisional surgery or staged procedures. Thiere can also be persistent swelling, poor wound healing, rippling or loose skin, worsening of cellulite, swelling, and thermal burn or heat injury from ultrasound with the ultrasound-assisted lipoplasty technique. Any change in weight fluctuations can alter the outcome.   Prescription sent to pharmacy. She has been doing well recently, generally healthy.  Electronically signed by: Carola Rhine Katti Pelle, PA-C 02/01/2019 2:35 PM

## 2019-02-11 ENCOUNTER — Encounter: Payer: Commercial Indemnity | Admitting: Surgical

## 2019-02-17 ENCOUNTER — Encounter (HOSPITAL_BASED_OUTPATIENT_CLINIC_OR_DEPARTMENT_OTHER): Payer: Self-pay

## 2019-02-17 ENCOUNTER — Other Ambulatory Visit: Payer: Self-pay

## 2019-02-21 ENCOUNTER — Encounter (HOSPITAL_BASED_OUTPATIENT_CLINIC_OR_DEPARTMENT_OTHER)
Admission: RE | Admit: 2019-02-21 | Discharge: 2019-02-21 | Disposition: A | Discharge status: Home / Self Care | Payer: Medicare Other | Source: Ambulatory Visit | Attending: Plastic Surgery | Admitting: Plastic Surgery

## 2019-02-21 ENCOUNTER — Other Ambulatory Visit (HOSPITAL_COMMUNITY)
Admission: RE | Admit: 2019-02-21 | Discharge: 2019-02-21 | Disposition: A | Discharge status: Home / Self Care | Payer: Medicare Other | Source: Ambulatory Visit | Attending: Plastic Surgery | Admitting: Plastic Surgery

## 2019-02-21 DIAGNOSIS — Z01812 Encounter for preprocedural laboratory examination: Secondary | ICD-10-CM | POA: Diagnosis not present

## 2019-02-21 DIAGNOSIS — Z20828 Contact with and (suspected) exposure to other viral communicable diseases: Secondary | ICD-10-CM | POA: Insufficient documentation

## 2019-02-21 LAB — BASIC METABOLIC PANEL
Anion gap: 10 (ref 5–15)
BUN: 9 mg/dL (ref 8–23)
CO2: 28 mmol/L (ref 22–32)
Calcium: 9.2 mg/dL (ref 8.9–10.3)
Chloride: 103 mmol/L (ref 98–111)
Creatinine, Ser: 1.11 mg/dL — ABNORMAL HIGH (ref 0.44–1.00)
GFR calc Af Amer: 60 mL/min — ABNORMAL LOW (ref >60)
GFR calc non Af Amer: 51 mL/min — ABNORMAL LOW (ref >60)
Glucose, Bld: 100 mg/dL — ABNORMAL HIGH (ref 70–99)
Potassium: 3.3 mmol/L — ABNORMAL LOW (ref 3.5–5.1)
Sodium: 141 mmol/L (ref 135–145)

## 2019-02-22 LAB — NOVEL CORONAVIRUS, NAA (HOSP ORDER, SEND-OUT TO REF LAB; TAT 18-24 HRS): SARS-CoV-2, NAA: NOT DETECTED

## 2019-02-22 NOTE — Progress Notes (Signed)
Lab results reviewed by Dr. Nyoka Cowden, K+ 3.3, will repeat BMP day of surgery.

## 2019-02-24 ENCOUNTER — Encounter (HOSPITAL_BASED_OUTPATIENT_CLINIC_OR_DEPARTMENT_OTHER): Payer: Self-pay | Admitting: *Deleted

## 2019-02-24 ENCOUNTER — Other Ambulatory Visit: Payer: Self-pay

## 2019-02-24 ENCOUNTER — Encounter (HOSPITAL_BASED_OUTPATIENT_CLINIC_OR_DEPARTMENT_OTHER)
Admission: RE | Disposition: A | Discharge status: Home / Self Care | Payer: Self-pay | Source: Home / Self Care | Attending: Plastic Surgery

## 2019-02-24 ENCOUNTER — Ambulatory Visit (HOSPITAL_BASED_OUTPATIENT_CLINIC_OR_DEPARTMENT_OTHER): Payer: Medicare Other | Admitting: Anesthesiology

## 2019-02-24 ENCOUNTER — Ambulatory Visit (HOSPITAL_BASED_OUTPATIENT_CLINIC_OR_DEPARTMENT_OTHER)
Admission: RE | Admit: 2019-02-24 | Discharge: 2019-02-24 | Disposition: A | Discharge status: Home / Self Care | Payer: Medicare Other | Attending: Plastic Surgery | Admitting: Plastic Surgery

## 2019-02-24 DIAGNOSIS — M545 Low back pain: Secondary | ICD-10-CM | POA: Diagnosis not present

## 2019-02-24 DIAGNOSIS — G8929 Other chronic pain: Secondary | ICD-10-CM | POA: Diagnosis not present

## 2019-02-24 DIAGNOSIS — Z85850 Personal history of malignant neoplasm of thyroid: Secondary | ICD-10-CM | POA: Diagnosis not present

## 2019-02-24 DIAGNOSIS — Z923 Personal history of irradiation: Secondary | ICD-10-CM | POA: Diagnosis not present

## 2019-02-24 DIAGNOSIS — Z9889 Other specified postprocedural states: Secondary | ICD-10-CM | POA: Diagnosis not present

## 2019-02-24 DIAGNOSIS — Z96653 Presence of artificial knee joint, bilateral: Secondary | ICD-10-CM | POA: Diagnosis not present

## 2019-02-24 DIAGNOSIS — N6489 Other specified disorders of breast: Secondary | ICD-10-CM | POA: Diagnosis not present

## 2019-02-24 DIAGNOSIS — I10 Essential (primary) hypertension: Secondary | ICD-10-CM | POA: Diagnosis not present

## 2019-02-24 DIAGNOSIS — Z86000 Personal history of in-situ neoplasm of breast: Secondary | ICD-10-CM | POA: Insufficient documentation

## 2019-02-24 DIAGNOSIS — E890 Postprocedural hypothyroidism: Secondary | ICD-10-CM | POA: Diagnosis not present

## 2019-02-24 DIAGNOSIS — Z853 Personal history of malignant neoplasm of breast: Secondary | ICD-10-CM | POA: Insufficient documentation

## 2019-02-24 DIAGNOSIS — Z9221 Personal history of antineoplastic chemotherapy: Secondary | ICD-10-CM | POA: Insufficient documentation

## 2019-02-24 DIAGNOSIS — Z9011 Acquired absence of right breast and nipple: Secondary | ICD-10-CM | POA: Diagnosis not present

## 2019-02-24 DIAGNOSIS — Z79899 Other long term (current) drug therapy: Secondary | ICD-10-CM | POA: Insufficient documentation

## 2019-02-24 DIAGNOSIS — G4733 Obstructive sleep apnea (adult) (pediatric): Secondary | ICD-10-CM | POA: Diagnosis not present

## 2019-02-24 DIAGNOSIS — Z79890 Hormone replacement therapy (postmenopausal): Secondary | ICD-10-CM | POA: Insufficient documentation

## 2019-02-24 DIAGNOSIS — Z9882 Breast implant status: Secondary | ICD-10-CM | POA: Diagnosis not present

## 2019-02-24 HISTORY — PX: LIPOSUCTION WITH LIPOFILLING: SHX6436

## 2019-02-24 LAB — BASIC METABOLIC PANEL
Anion gap: 6 (ref 5–15)
BUN: 16 mg/dL (ref 8–23)
CO2: 23 mmol/L (ref 22–32)
Calcium: 8.6 mg/dL — ABNORMAL LOW (ref 8.9–10.3)
Chloride: 107 mmol/L (ref 98–111)
Creatinine, Ser: 1.01 mg/dL — ABNORMAL HIGH (ref 0.44–1.00)
GFR calc Af Amer: >60 mL/min (ref >60)
GFR calc non Af Amer: 58 mL/min — ABNORMAL LOW (ref >60)
Glucose, Bld: 95 mg/dL (ref 70–99)
Potassium: 3.7 mmol/L (ref 3.5–5.1)
Sodium: 136 mmol/L (ref 135–145)

## 2019-02-24 SURGERY — LIPOSUCTION, WITH FAT TRANSFER
Anesthesia: General | Site: Breast | Laterality: Bilateral

## 2019-02-24 MED ORDER — FENTANYL CITRATE (PF) 100 MCG/2ML IJ SOLN
INTRAMUSCULAR | Status: AC
  Filled 2019-02-24: qty 2

## 2019-02-24 MED ORDER — LIDOCAINE-EPINEPHRINE 1 %-1:100000 IJ SOLN
INTRAMUSCULAR | Status: DC | PRN
  Administered 2019-02-24: 10 mL

## 2019-02-24 MED ORDER — ACETAMINOPHEN 325 MG PO TABS: 650.0000 mg | ORAL_TABLET | ORAL | Status: DC | PRN

## 2019-02-24 MED ORDER — DEXAMETHASONE SODIUM PHOSPHATE 10 MG/ML IJ SOLN
INTRAMUSCULAR | Status: AC
  Filled 2019-02-24: qty 1

## 2019-02-24 MED ORDER — MIDAZOLAM HCL 2 MG/2ML IJ SOLN
INTRAMUSCULAR | Status: AC
  Filled 2019-02-24: qty 2

## 2019-02-24 MED ORDER — PHENYLEPHRINE 40 MCG/ML (10ML) SYRINGE FOR IV PUSH (FOR BLOOD PRESSURE SUPPORT)
PREFILLED_SYRINGE | INTRAVENOUS | Status: DC | PRN
  Administered 2019-02-24 (×2): 80 ug via INTRAVENOUS
  Administered 2019-02-24: 120 ug via INTRAVENOUS
  Administered 2019-02-24: 40 ug via INTRAVENOUS
  Administered 2019-02-24: 80 ug via INTRAVENOUS
  Administered 2019-02-24: 40 ug via INTRAVENOUS
  Administered 2019-02-24: 80 ug via INTRAVENOUS

## 2019-02-24 MED ORDER — LIDOCAINE HCL (PF) 1 % IJ SOLN
INTRAMUSCULAR | Status: AC
  Filled 2019-02-24: qty 30

## 2019-02-24 MED ORDER — ONDANSETRON HCL 4 MG/2ML IJ SOLN: 4.0000 mg | Freq: Once | INTRAMUSCULAR | Status: DC | PRN

## 2019-02-24 MED ORDER — EPINEPHRINE PF 1 MG/ML IJ SOLN
INTRAMUSCULAR | Status: AC
  Filled 2019-02-24: qty 1

## 2019-02-24 MED ORDER — MEPERIDINE HCL 25 MG/ML IJ SOLN: 6.2500 mg | INTRAMUSCULAR | Status: DC | PRN

## 2019-02-24 MED ORDER — PHENYLEPHRINE 40 MCG/ML (10ML) SYRINGE FOR IV PUSH (FOR BLOOD PRESSURE SUPPORT)
PREFILLED_SYRINGE | INTRAVENOUS | Status: AC
  Filled 2019-02-24: qty 10

## 2019-02-24 MED ORDER — PROPOFOL 10 MG/ML IV BOLUS
INTRAVENOUS | Status: AC
  Filled 2019-02-24: qty 20

## 2019-02-24 MED ORDER — OXYCODONE HCL 5 MG/5ML PO SOLN: 5.0000 mg | Freq: Once | ORAL | Status: DC | PRN

## 2019-02-24 MED ORDER — SODIUM CHLORIDE 0.9 % IV SOLN: 250.0000 mL | INTRAVENOUS | Status: DC | PRN

## 2019-02-24 MED ORDER — SODIUM CHLORIDE 0.9% FLUSH: 3.0000 mL | Freq: Two times a day (BID) | INTRAVENOUS | Status: DC

## 2019-02-24 MED ORDER — ONDANSETRON HCL 4 MG/2ML IJ SOLN
INTRAMUSCULAR | Status: AC
  Filled 2019-02-24: qty 2

## 2019-02-24 MED ORDER — SODIUM CHLORIDE 0.9% FLUSH: 3.0000 mL | INTRAVENOUS | Status: DC | PRN

## 2019-02-24 MED ORDER — HYDRALAZINE HCL 20 MG/ML IJ SOLN
INTRAMUSCULAR | Status: AC
  Filled 2019-02-24: qty 1

## 2019-02-24 MED ORDER — ACETAMINOPHEN 650 MG RE SUPP: 650.0000 mg | RECTAL | Status: DC | PRN

## 2019-02-24 MED ORDER — LIDOCAINE 2% (20 MG/ML) 5 ML SYRINGE
INTRAMUSCULAR | Status: AC
  Filled 2019-02-24: qty 5

## 2019-02-24 MED ORDER — LIDOCAINE HCL 1 % IJ SOLN
INTRAVENOUS | Status: DC | PRN
  Administered 2019-02-24: 12:00:00 1000 mL

## 2019-02-24 MED ORDER — ACETAMINOPHEN 160 MG/5ML PO SOLN: 325.0000 mg | ORAL | Status: DC | PRN

## 2019-02-24 MED ORDER — ACETAMINOPHEN 325 MG PO TABS: 325.0000 mg | ORAL_TABLET | ORAL | Status: DC | PRN

## 2019-02-24 MED ORDER — FENTANYL CITRATE (PF) 100 MCG/2ML IJ SOLN: 25.0000 ug | INTRAMUSCULAR | Status: DC | PRN

## 2019-02-24 MED ORDER — HYDRALAZINE HCL 20 MG/ML IJ SOLN: 20.0000 mg | Freq: Once | INTRAMUSCULAR | Status: DC

## 2019-02-24 MED ORDER — OXYCODONE HCL 5 MG PO TABS: 5.0000 mg | ORAL_TABLET | Freq: Once | ORAL | Status: DC | PRN

## 2019-02-24 MED ORDER — FENTANYL CITRATE (PF) 100 MCG/2ML IJ SOLN
INTRAMUSCULAR | Status: DC | PRN
  Administered 2019-02-24: 25 ug via INTRAVENOUS
  Administered 2019-02-24: 50 ug via INTRAVENOUS
  Administered 2019-02-24 (×2): 25 ug via INTRAVENOUS
  Administered 2019-02-24: 50 ug via INTRAVENOUS
  Administered 2019-02-24: 25 ug via INTRAVENOUS

## 2019-02-24 MED ORDER — MIDAZOLAM HCL 5 MG/5ML IJ SOLN
INTRAMUSCULAR | Status: DC | PRN
  Administered 2019-02-24: 2 mg via INTRAVENOUS

## 2019-02-24 MED ORDER — LIDOCAINE 2% (20 MG/ML) 5 ML SYRINGE
INTRAMUSCULAR | Status: DC | PRN
  Administered 2019-02-24: 100 mg via INTRAVENOUS

## 2019-02-24 MED ORDER — CEFAZOLIN SODIUM-DEXTROSE 2-4 GM/100ML-% IV SOLN
INTRAVENOUS | Status: AC
  Filled 2019-02-24: qty 100

## 2019-02-24 MED ORDER — OXYCODONE HCL 5 MG PO TABS: 5.0000 mg | ORAL_TABLET | ORAL | Status: DC | PRN

## 2019-02-24 MED ORDER — CEFAZOLIN SODIUM-DEXTROSE 2-4 GM/100ML-% IV SOLN
2.0000 g | INTRAVENOUS | Status: AC
  Administered 2019-02-24: 2 g via INTRAVENOUS

## 2019-02-24 MED ORDER — DEXAMETHASONE SODIUM PHOSPHATE 10 MG/ML IJ SOLN
INTRAMUSCULAR | Status: DC | PRN
  Administered 2019-02-24: 10 mg via INTRAVENOUS

## 2019-02-24 MED ORDER — PROPOFOL 10 MG/ML IV BOLUS
INTRAVENOUS | Status: DC | PRN
  Administered 2019-02-24: 200 mg via INTRAVENOUS

## 2019-02-24 MED ORDER — CHLORHEXIDINE GLUCONATE CLOTH 2 % EX PADS: 6.0000 | MEDICATED_PAD | Freq: Once | CUTANEOUS | Status: DC

## 2019-02-24 MED ORDER — LACTATED RINGERS IV SOLN
INTRAVENOUS | Status: DC
  Administered 2019-02-24 (×2): via INTRAVENOUS

## 2019-02-24 MED ORDER — ONDANSETRON HCL 4 MG/2ML IJ SOLN
INTRAMUSCULAR | Status: DC | PRN
  Administered 2019-02-24: 4 mg via INTRAVENOUS

## 2019-02-24 SURGICAL SUPPLY — 51 items
ADH SKN CLS APL DERMABOND .7 (GAUZE/BANDAGES/DRESSINGS)
APL PRP STRL LF DISP 70% ISPRP (MISCELLANEOUS) ×2
BINDER ABDOMINAL  9 SM 30-45 (SOFTGOODS)
BINDER ABDOMINAL 10 UNV 27-48 (MISCELLANEOUS) IMPLANT
BINDER ABDOMINAL 12 SM 30-45 (SOFTGOODS) ×1 IMPLANT
BINDER ABDOMINAL 9 SM 30-45 (SOFTGOODS) IMPLANT
BINDER BREAST LRG (GAUZE/BANDAGES/DRESSINGS) IMPLANT
BINDER BREAST MEDIUM (GAUZE/BANDAGES/DRESSINGS) IMPLANT
BINDER BREAST XLRG (GAUZE/BANDAGES/DRESSINGS) IMPLANT
BINDER BREAST XXLRG (GAUZE/BANDAGES/DRESSINGS) ×1 IMPLANT
BLADE HEX COATED 2.75 (ELECTRODE) IMPLANT
BLADE SURG 15 STRL LF DISP TIS (BLADE) ×1 IMPLANT
BLADE SURG 15 STRL SS (BLADE) ×2
BNDG GAUZE ELAST 4 BULKY (GAUZE/BANDAGES/DRESSINGS) ×4 IMPLANT
CHLORAPREP W/TINT 26 (MISCELLANEOUS) ×3 IMPLANT
COVER BACK TABLE REUSABLE LG (DRAPES) ×2 IMPLANT
COVER MAYO STAND REUSABLE (DRAPES) ×2 IMPLANT
COVER WAND RF STERILE (DRAPES) IMPLANT
DECANTER SPIKE VIAL GLASS SM (MISCELLANEOUS) ×1 IMPLANT
DERMABOND ADVANCED (GAUZE/BANDAGES/DRESSINGS)
DERMABOND ADVANCED .7 DNX12 (GAUZE/BANDAGES/DRESSINGS) ×1 IMPLANT
DRAPE LAPAROSCOPIC ABDOMINAL (DRAPES) ×2 IMPLANT
DRSG PAD ABDOMINAL 8X10 ST (GAUZE/BANDAGES/DRESSINGS) ×4 IMPLANT
ELECT REM PT RETURN 9FT ADLT (ELECTROSURGICAL) ×2
ELECTRODE REM PT RTRN 9FT ADLT (ELECTROSURGICAL) ×1 IMPLANT
EXTRACTOR CANIST REVOLVE STRL (CANNISTER) ×2 IMPLANT
GLOVE BIO SURGEON STRL SZ 6.5 (GLOVE) ×6 IMPLANT
GOWN STRL REUS W/ TWL LRG LVL3 (GOWN DISPOSABLE) ×2 IMPLANT
GOWN STRL REUS W/TWL LRG LVL3 (GOWN DISPOSABLE) ×8
IV LACTATED RINGERS 1000ML (IV SOLUTION) ×5 IMPLANT
LINER CANISTER 1000CC FLEX (MISCELLANEOUS) ×4 IMPLANT
NDL HYPO 25X1 1.5 SAFETY (NEEDLE) IMPLANT
NDL SAFETY ECLIPSE 18X1.5 (NEEDLE) ×1 IMPLANT
NEEDLE HYPO 18GX1.5 SHARP (NEEDLE)
NEEDLE HYPO 25X1 1.5 SAFETY (NEEDLE) ×2 IMPLANT
PACK BASIN DAY SURGERY FS (CUSTOM PROCEDURE TRAY) ×2 IMPLANT
PAD ALCOHOL SWAB (MISCELLANEOUS) ×2 IMPLANT
PENCIL BUTTON HOLSTER BLD 10FT (ELECTRODE) IMPLANT
SLEEVE SCD COMPRESS KNEE MED (MISCELLANEOUS) ×2 IMPLANT
SPONGE LAP 18X18 RF (DISPOSABLE) ×2 IMPLANT
SUT MNCRL AB 4-0 PS2 18 (SUTURE) IMPLANT
SUT MON AB 5-0 PS2 18 (SUTURE) ×4 IMPLANT
SYR 10ML LL (SYRINGE) ×8 IMPLANT
SYR 3ML 18GX1 1/2 (SYRINGE) IMPLANT
SYR 50ML LL SCALE MARK (SYRINGE) ×5 IMPLANT
SYR CONTROL 10ML LL (SYRINGE) ×2 IMPLANT
SYR TOOMEY 50ML (SYRINGE) ×4 IMPLANT
TOWEL GREEN STERILE FF (TOWEL DISPOSABLE) ×4 IMPLANT
TUBING INFILTRATION IT-10001 (TUBING) IMPLANT
TUBING SET GRADUATE ASPIR 12FT (MISCELLANEOUS) ×2 IMPLANT
UNDERPAD 30X36 HEAVY ABSORB (UNDERPADS AND DIAPERS) ×4 IMPLANT

## 2019-02-24 NOTE — Transfer of Care (Signed)
Immediate Anesthesia Transfer of Care Note  Patient: [name]  Procedure(s) Performed: Bilateral lipofilling of breasts and liposuction of lateral breasts for symmetry (Bilateral Breast)  Patient Location: PACU  Anesthesia Type:General  Level of Consciousness: awake, alert  and oriented  Airway & Oxygen Therapy: Patient Spontanous Breathing and Patient connected to face mask oxygen  Post-op Assessment: Report given to RN and Post -op Vital signs reviewed and stable  Post vital signs: Reviewed and stable  Last Vitals:  Vitals Value Taken Time  BP 140/76   Temp    Pulse 78   Resp 12   SpO2 100%     Last Pain:  Vitals:   02/24/19 1004  TempSrc: Oral  PainSc:          Complications: No apparent anesthesia complications

## 2019-02-24 NOTE — Interval H&P Note (Signed)
History and Physical Interval Note:  02/24/2019 10:23 AM  [name]  has presented today for surgery, with the diagnosis of Post Breast Reconstruction, Bilateral; History of reconstruction of both breasts; Acquired absence of right breast.  The various methods of treatment have been discussed with the patient and family. After consideration of risks, benefits and other options for treatment, the patient has consented to  Procedure(s) with comments: Bilateral lipofilling of breasts and liposuction of lateral breasts for symmetry (Bilateral) - 2 hours, please as a surgical intervention.  The patient's history has been reviewed, patient examined, no change in status, stable for surgery.  I have reviewed the patient's chart and labs.  Questions were answered to the patient's satisfaction.     [name] [name]

## 2019-02-24 NOTE — Anesthesia Procedure Notes (Signed)
Procedure Name: Intubation Date/Time: 02/24/2019 11:23 AM Performed by: Genelle Bal, CRNA Pre-anesthesia Checklist: Patient identified, Emergency Drugs available, Suction available and Patient being monitored Patient Re-evaluated:Patient Re-evaluated prior to induction Oxygen Delivery Method: Circle system utilized Preoxygenation: Pre-oxygenation with 100% oxygen Induction Type: IV induction Ventilation: Mask ventilation without difficulty LMA Size: 4.0 Tube type: Oral Number of attempts: 1 Airway Equipment and Method: Stylet and Oral airway Placement Confirmation: ETT inserted through vocal cords under direct vision,  positive ETCO2 and breath sounds checked- equal and bilateral Tube secured with: Tape Dental Injury: Teeth and Oropharynx as per pre-operative assessment

## 2019-02-24 NOTE — Discharge Instructions (Addendum)
INSTRUCTIONS FOR AFTER BREAST SURGERY   You are getting ready to undergo breast surgery.  You will likely have some questions about what to expect following your operation.  The following information will help you and your family understand what to expect when you are discharged from the hospital.  Following these guidelines will help ensure a smooth recovery and reduce risks of complications.   Postoperative instructions include information on: diet, wound care, medications and physical activity.  AFTER SURGERY Expect to go home after the procedure.  In some cases, you may need to spend one night in the hospital for observation.  DIET Breast surgery does not require a specific diet.  However, I have to mention that the healthier you eat the better your body can start healing. It is important to increasing your protein intake.  This means limiting the foods with sugar and carbohydrates.  Focus on vegetables and some meat.  If you have any liposuction during your procedure be sure to drink water.  If your urine is bright yellow, then it is concentrated, and you need to drink more water.  As a general rule after surgery, you should have 8 ounces of water every hour while awake.  If you find you are persistently nauseated or unable to take in liquids let us know.  NO TOBACCO USE or EXPOSURE.  This will slow your healing process and increase the risk of a wound.  WOUND CARE If you don't have a drain:  You can shower the day after surgery. Use fragrance free soap.  Dial, [place] and [place] are usually mild on the skin. If you have steri-strips / tape directly attached to your skin leave them in place. It is OK to get these wet.  No baths, pools or hot tubs for two weeks. We close your incision to leave the smallest and best-looking scar. No ointment or creams on your incisions until given the go ahead.  Especially not Neosporin (Too many skin reactions with this one).  A few weeks after surgery you can use  Mederma and start massaging the scar. We ask you to wear your binder or sports bra for the first 6 weeks around the clock, including while sleeping. This provides added comfort and helps reduce the fluid accumulation at the surgery site.  ACTIVITY No heavy lifting until cleared by the doctor.  This usually means no more than a half-gallon of milk.  It is OK to walk and climb stairs. In fact, moving your legs is very important to decrease your risk of a blood clot.  It will also help keep you from getting deconditioned.  Every 1 to 2 hours get up and walk for 5 minutes. This will help with a quicker recovery back to normal.  Let pain be your guide so you don't do too much.  NO, you cannot do the spring cleaning and don't plan on taking care of anyone else.  This is your time for TLC.  You will be more comfortable if you sleep and rest with your head elevated either with a few pillows under you or in a recliner.  No stomach sleeping for a few months.  WORK Everyone returns to work at different times. As a rough guide, most people take at least 1 - 2 weeks off prior to returning to work. If you need documentation for your job, bring the forms to your postoperative follow up visit.  DRIVING Arrange for someone to bring you home from the hospital.  You may be able to drive a few days after surgery but not while taking any narcotics or valium.  BOWEL MOVEMENTS Constipation can occur after anesthesia and while taking pain medication.  It is important to stay ahead for your comfort.  We recommend taking Milk of Magnesia (2 tablespoons; twice a day) while taking the pain pills.  SEROMA This is fluid your body tried to put in the surgical site.  This is normal but if it creates tight skinny skin let us know.  It usually decreases in a few weeks.  WHEN TO CALL Call your surgeon's office if any of the following occur:  Fever 101 degrees F or greater  Excessive bleeding or fluid from the incision  site.  Pain that increases over time without aid from the medications  Redness, warmth, or pus draining from incision sites  Persistent nausea or inability to take in liquids  Severe misshapen area that underwent the operation.    Post Anesthesia Home Care Instructions  Activity: Get plenty of rest for the remainder of the day. A responsible individual must stay with you for 24 hours following the procedure.  For the next 24 hours, DO NOT: -Drive a car -Paediatric nurse -Drink alcoholic beverages -Take any medication unless instructed by your physician -Make any legal decisions or sign important papers.  Meals: Start with liquid foods such as gelatin or soup. Progress to regular foods as tolerated. Avoid greasy, spicy, heavy foods. If nausea and/or vomiting occur, drink only clear liquids until the nausea and/or vomiting subsides. Call your physician if vomiting continues.  Special Instructions/Symptoms: Your throat may feel dry or sore from the anesthesia or the breathing tube placed in your throat during surgery. If this causes discomfort, gargle with warm salt water. The discomfort should disappear within 24 hours.  If you had a scopolamine patch placed behind your ear for the management of post- operative nausea and/or vomiting:  1. The medication in the patch is effective for 72 hours, after which it should be removed.  Wrap patch in a tissue and discard in the trash. Wash hands thoroughly with soap and water. 2. You may remove the patch earlier than 72 hours if you experience unpleasant side effects which may include dry mouth, dizziness or visual disturbances. 3. Avoid touching the patch. Wash your hands with soap and water after contact with the patch.

## 2019-02-24 NOTE — Anesthesia Preprocedure Evaluation (Signed)
Anesthesia Evaluation  Patient identified by MRN, date of birth, ID band Patient awake    Reviewed: Allergy & Precautions, H&P , NPO status , Patient's Chart, lab work & pertinent test results, reviewed documented beta blocker date and time   Airway Mallampati: II  TM Distance: >3 FB Neck ROM: full    Dental no notable dental hx. (+) Teeth Intact, Dental Advisory Given   Pulmonary neg pulmonary ROS,    Pulmonary exam normal breath sounds clear to auscultation       Cardiovascular Exercise Tolerance: Good hypertension, negative cardio ROS Normal cardiovascular exam Rhythm:regular Rate:Normal  TTE 2016 EF 55-60%, no valvular abnormalities, grade 1 DD   Neuro/Psych negative neurological ROS  negative psych ROS   GI/Hepatic negative GI ROS, Neg liver ROS,   Endo/Other  negative endocrine ROS  Renal/GU negative Renal ROS  negative genitourinary   Musculoskeletal  (+) Arthritis ,   Abdominal   Peds  Hematology  (+) Blood dyscrasia, anemia ,   Anesthesia Other Findings H/o breast cancer, s/p mastectomy  Reproductive/Obstetrics negative OB ROS                             Anesthesia Physical  Anesthesia Plan  ASA: III  Anesthesia Plan: General   Post-op Pain Management:    Induction: Intravenous  PONV Risk Score and Plan: 3 and Dexamethasone, Treatment may vary due to age or medical condition, Ondansetron and Midazolam  Airway Management Planned: Oral ETT and LMA  Additional Equipment:   Intra-op Plan:   Post-operative Plan: Extubation in OR  Informed Consent: I have reviewed the patients History and Physical, chart, labs and discussed the procedure including the risks, benefits and alternatives for the proposed anesthesia with the patient or authorized representative who has indicated his/her understanding and acceptance.     Dental Advisory Given  Plan Discussed with:  CRNA  Anesthesia Plan Comments:         Anesthesia Quick Evaluation

## 2019-02-24 NOTE — Op Note (Signed)
DATE OF OPERATION: 02/24/2019  LOCATION: [facility]  PREOPERATIVE DIAGNOSIS: breast asymmetry after reconstruction   POSTOPERATIVE DIAGNOSIS: Same  PROCEDURE: Lipofilling of bilateral breast with liposuction of lateral breast for symmetry  SURGEON: Chesney Klimaszewski Sanger Amun Stemm, DO  ASSISTANT: Roetta Sessions, PA  EBL: 10 cc  CONDITION: Stable  COMPLICATIONS: None  INDICATION: The patient, [name], is a 68 y.o. female born on Jun 05, 1950, is here for treatment of breast asymmetry after reconstruction.   PROCEDURE DETAILS:  The patient was seen prior to surgery and marked.  The IV antibiotics were given. The patient was taken to the operating room and given a general anesthetic. A standard time out was performed and all information was confirmed by those in the room. SCDs were placed.   She was prepped and draped.  Local with epinepherine was placed in the umbilical area and lateral breast scar of both breasts.  The #15 blade was used to make a 5 mm incision.  The tumescent was infused into the abdomen.  After waiting for the local and tumescent to take effect we began.  Liposuction was done of the abdomen and collected in the revolve.  The revolve was used to prepare the fat for placement.  It was cleaned according to the manufacture guidelines.  The fat was the injected with 10 cc syringes into the superior aspect of both breasts.  The left had 60 cc and the right had 160 cc placed.  Liposuction was done to the medial left breast for improved contour and the lateral left breast for improved symmetry. The incisions were closed with the 5-0 Monocryl.  ABD and binders were placed. The patient was allowed to wake up and taken to recovery room in stable condition at the end of the case. The family was notified at the end of the case.   The advanced practice practitioner (APP) assisted throughout the case.  The APP was essential in retraction and counter traction when needed  to make the case progress smoothly.  This retraction and assistance made it possible to see the tissue plans for the procedure.  The assistance was needed for blood control, tissue re-approximation and assisted with closure of the incision site.

## 2019-02-24 NOTE — Anesthesia Postprocedure Evaluation (Signed)
Anesthesia Post Note  Patient: [name]  Procedure(s) Performed: Bilateral lipofilling of breasts and liposuction of lateral breasts for symmetry (Bilateral Breast)     Patient location during evaluation: PACU Anesthesia Type: General Level of consciousness: awake and alert Pain management: pain level controlled Vital Signs Assessment: post-procedure vital signs reviewed and stable Respiratory status: spontaneous breathing, nonlabored ventilation, respiratory function stable and patient connected to nasal cannula oxygen Cardiovascular status: blood pressure returned to baseline and stable Postop Assessment: no apparent nausea or vomiting Anesthetic complications: no    Last Vitals:  Vitals:   02/24/19 1345 02/24/19 1410  BP: (!) 152/97 118/87  Pulse: 83 97  Resp: (!) 9 18  Temp:  36.8 C  SpO2: 97% 97%    Last Pain:  Vitals:   02/24/19 1410  TempSrc: Oral  PainSc: 0-No pain                 Masa Lubin

## 2019-02-28 ENCOUNTER — Encounter (HOSPITAL_BASED_OUTPATIENT_CLINIC_OR_DEPARTMENT_OTHER): Payer: Self-pay | Admitting: Plastic Surgery

## 2019-03-02 ENCOUNTER — Telehealth: Payer: Self-pay

## 2019-03-02 NOTE — Telephone Encounter (Signed)
Called and spoke with the patient and informed her of the message below by [facility].  She verbalized understanding and stated that she could wait until her appointment on this Friday.//AB/CMA

## 2019-03-02 NOTE — Telephone Encounter (Signed)
Received voicemail from patient. Patient had surgery on 10/29. Patient asking if she can remove binder and wear normal bra. Patient call back number [phone]

## 2019-03-02 NOTE — Telephone Encounter (Signed)
She can switch to a sports bra if she absolutely needs to, but would recommend breast binder until post-op visit for evaluation. She should avoid regular bra or bra with underwire-Per Matthew,PA

## 2019-03-03 ENCOUNTER — Telehealth: Payer: Self-pay | Admitting: Plastic Surgery

## 2019-03-03 NOTE — Telephone Encounter (Signed)

## 2019-03-04 ENCOUNTER — Encounter: Payer: Self-pay | Admitting: Surgical

## 2019-03-04 ENCOUNTER — Other Ambulatory Visit: Payer: Self-pay

## 2019-03-04 ENCOUNTER — Ambulatory Visit (INDEPENDENT_AMBULATORY_CARE_PROVIDER_SITE_OTHER): Payer: Medicare Other | Admitting: Surgical

## 2019-03-04 VITALS — BP 140/83 | HR 94 | Temp 97.7°F | Ht 64.0 in | Wt 203.4 lb

## 2019-03-04 DIAGNOSIS — Z9889 Other specified postprocedural states: Secondary | ICD-10-CM

## 2019-03-04 NOTE — Progress Notes (Signed)
   Subjective:     Patient ID: [name], female    DOB: 1950/11/08, 67 y.o.   MRN: [id number]  Chief Complaint  Patient presents with  . Post-op Follow-up    (B) lipophilic of breast and lipo    HPI: The patient is a 68 y.o. female here for follow-up on her breast reconstruction. She underwent bilateral lipofilling of breasts and lateral liposuction on 02/24/19. She is 9 days postop.  She is doing really well. She has minimal pain and is very happy with her progress. Her lipo incisions are healing well. C/d/i. No drainage, erythema noted. She has some mild abdominal swelling and ecchymosis over R breast and midline abdomen.  She has been wearing breast binder 24/7, but has removed the abdominal binder.  No fever, chills, n/v. No complaints. She has some b/l LE swelling, but this is not uncommon for her and she reports "I take a water pill regularly"  Review of Systems  Constitutional: Positive for activity change. Negative for appetite change, chills, diaphoresis, fatigue and fever.  Cardiovascular: Negative for chest pain.  Gastrointestinal: Negative for diarrhea, nausea and vomiting.  Musculoskeletal: Positive for myalgias.  Skin: Positive for color change. Negative for pallor, rash and wound.  Neurological: Negative for dizziness and weakness.     Objective:   Vital Signs BP 140/83 (BP Location: Left Arm, Patient Position: Sitting, Cuff Size: Large)   Pulse 94   Temp 97.7 F (36.5 C) (Temporal)   Ht 5\' 4"  (1.626 m)   Wt 203 lb 6.4 oz (92.3 kg)   SpO2 92%   BMI 34.91 kg/m  Vital Signs and Nursing Note Reviewed Chaperone present Physical Exam  Constitutional: She is oriented to person, place, and time and well-developed, well-nourished, and in no distress.  HENT:  Head: Normocephalic and atraumatic.  Cardiovascular: Normal rate.  Pulmonary/Chest: Effort normal.    Abdominal:    Musculoskeletal: Normal range of motion.  Neurological: She is alert and  oriented to person, place, and time. Gait normal.  Skin: Skin is warm and dry. No rash noted. She is not diaphoretic. No erythema. No pallor.  Psychiatric: Mood and affect normal.      Assessment/Plan:     ICD-10-CM   1. S/P breast reconstruction, bilateral  Z98.890   2. History of reconstruction of both breasts  Z98.890    Mrs. Devillers is doing really well. She is healing really well.  She can switch to sports bra for 5 more weeks 24/7 then transition to during the day only. Avoid stomach sleeping.  Healthy eating, high protein for healing.   Recommended she return to wearing abdominal binder for assistance with decreasing abdominal swelling.  Follow up in 4 weeks.    Carola Rhine Fielding Mault, PA-C 03/04/2019, 1:48 PM

## 2019-04-01 ENCOUNTER — Encounter: Payer: Self-pay | Admitting: Plastic Surgery

## 2019-04-01 ENCOUNTER — Other Ambulatory Visit: Payer: Self-pay

## 2019-04-01 ENCOUNTER — Ambulatory Visit (INDEPENDENT_AMBULATORY_CARE_PROVIDER_SITE_OTHER): Payer: Commercial Indemnity | Admitting: Plastic Surgery

## 2019-04-01 VITALS — BP 100/70 | HR 85 | Temp 98.2°F | Ht 64.0 in | Wt 200.0 lb

## 2019-04-01 DIAGNOSIS — Z9889 Other specified postprocedural states: Secondary | ICD-10-CM

## 2019-04-01 NOTE — Progress Notes (Signed)
   Subjective:    Patient ID: [name], female    DOB: 1950-12-04, 68 y.o.   MRN: [id number]  The patient is a 68 year old female here for her breast reconstruction.  Overall she is doing extremely well.  She is very pleased with her results.  She feels very comfortable in clothes.  There is no sign of infection. All incisions healed.     Review of Systems  Constitutional: Negative.   HENT: Negative.   Eyes: Negative.   Respiratory: Negative.   Cardiovascular: Negative.   Genitourinary: Negative.   Psychiatric/Behavioral: Negative.        Objective:   Physical Exam Vitals signs and nursing note reviewed.  Constitutional:      Appearance: Normal appearance.  HENT:     Head: Normocephalic and atraumatic.  Cardiovascular:     Rate and Rhythm: Normal rate.     Pulses: Normal pulses.  Pulmonary:     Effort: Pulmonary effort is normal.  Abdominal:     General: Abdomen is flat.  Neurological:     General: No focal deficit present.     Mental Status: She is alert and oriented to person, place, and time.  Psychiatric:        Mood and Affect: Mood normal.        Behavior: Behavior normal.       Assessment & Plan:     ICD-10-CM   1. History of reconstruction of both breasts  Z98.890     Plan for nipple areola tattoo placement.

## 2019-05-09 DIAGNOSIS — I10 Essential (primary) hypertension: Secondary | ICD-10-CM | POA: Diagnosis not present

## 2019-05-09 DIAGNOSIS — E039 Hypothyroidism, unspecified: Secondary | ICD-10-CM | POA: Diagnosis not present

## 2019-05-10 DIAGNOSIS — M79642 Pain in left hand: Secondary | ICD-10-CM | POA: Diagnosis not present

## 2019-05-10 DIAGNOSIS — M1812 Unilateral primary osteoarthritis of first carpometacarpal joint, left hand: Secondary | ICD-10-CM | POA: Diagnosis not present

## 2019-05-17 DIAGNOSIS — E890 Postprocedural hypothyroidism: Secondary | ICD-10-CM | POA: Diagnosis not present

## 2019-05-18 DIAGNOSIS — M25642 Stiffness of left hand, not elsewhere classified: Secondary | ICD-10-CM | POA: Diagnosis not present

## 2019-05-18 DIAGNOSIS — M25542 Pain in joints of left hand: Secondary | ICD-10-CM | POA: Diagnosis not present

## 2019-05-18 DIAGNOSIS — M1812 Unilateral primary osteoarthritis of first carpometacarpal joint, left hand: Secondary | ICD-10-CM | POA: Diagnosis not present

## 2019-05-30 DIAGNOSIS — R69 Illness, unspecified: Secondary | ICD-10-CM | POA: Diagnosis not present

## 2019-06-13 ENCOUNTER — Ambulatory Visit: Payer: Commercial Indemnity

## 2019-06-20 ENCOUNTER — Other Ambulatory Visit: Payer: Self-pay

## 2019-06-20 ENCOUNTER — Ambulatory Visit (INDEPENDENT_AMBULATORY_CARE_PROVIDER_SITE_OTHER): Payer: Medicare HMO

## 2019-06-20 VITALS — BP 130/83 | HR 79 | Temp 97.8°F | Ht 64.0 in | Wt 198.0 lb

## 2019-06-20 DIAGNOSIS — Z9011 Acquired absence of right breast and nipple: Secondary | ICD-10-CM

## 2019-06-20 MED ORDER — LIDOCAINE-PRILOCAINE 2.5-2.5 % EX CREA: 1.0000 "application " | TOPICAL_CREAM | CUTANEOUS | 0 refills | Status: DC | PRN

## 2019-06-22 ENCOUNTER — Telehealth: Payer: Self-pay | Admitting: Plastic Surgery

## 2019-06-22 NOTE — Telephone Encounter (Signed)
Pt called to ask Doroteo Bradford question about nipple tattoo received on Monday.

## 2019-06-23 IMAGING — NM NM [ID] THYROID CANCER METS SP CA TX
6 series · 6 of 6 positions shown · non-contrast
Comparison: None.

CLINICAL DATA: Papillary thyroid cancer, recurrent

EXAM:
NUCLEAR MEDICINE V-1K1 POST THERAPY WHOLE BODY SCAN
TECHNIQUE: The patient received 149 mCi V-1K1 sodium iodide for the treatment
of thyroid cancer within the past 10 days. The patient returns
today, and whole body scanning was performed in the anterior and
posterior projections.

[Series 1: marker · 4.14mm/px · 1 of 1 slices shown (1 of 2)]
[im 1/1]
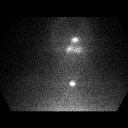

[Series 1: marker · 4.14mm/px · 1 of 1 slices shown (2 of 2)]
[im 1/1]
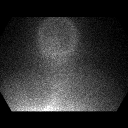

[Series 2: static thyroid no marker · 4.14mm/px · 1 of 1 slices shown (1 of 2)]
[im 1/1]
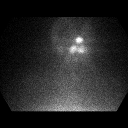

[Series 2: static thyroid no marker · 4.14mm/px · 1 of 1 slices shown (2 of 2)]
[im 1/1]
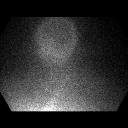

[Series 3: i131 whole body · 2.66mm/px · 1 of 1 slices shown (1 of 2)]
[im 1/1]
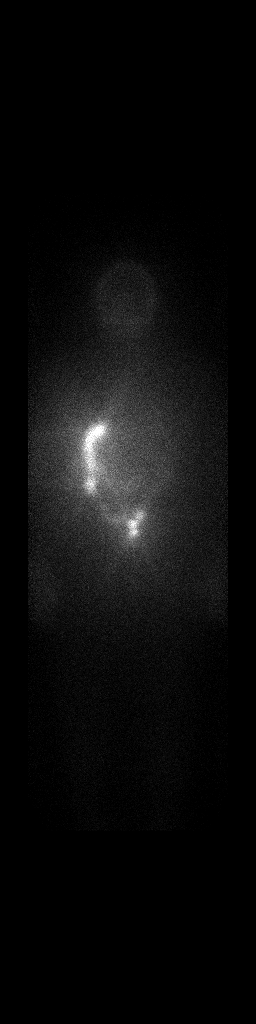

[Series 3: i131 whole body · 2.66mm/px · 1 of 1 slices shown (2 of 2)]
[im 1/1]
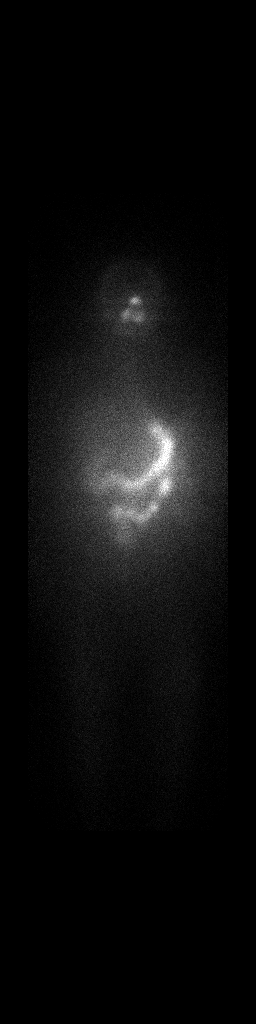

[6 of 6 positions shown; findings below may reference images not displayed]

FINDINGS: Minimal physiologic salivary localization of tracer.

Expected tracer excretion within colon and minimally in urinary
bladder.

No residual radio iodine accumulation in the cervical region.

No abnormal radio iodine accumulation is identified to suggest
iodine-avid metastatic thyroid cancer.
IMPRESSION: No scintigraphic evidence of iodine-avid metastatic thyroid cancer.

## 2019-06-23 NOTE — Telephone Encounter (Signed)
Call back to pt re:  She reports that both breasts have some degree of tissue sloughing-( right>left) Right  was the radiated side- she denies any pain,drainage or increased redness or other complication She continues to use vaseline/gauze & will continue to monitor for any concerns- she will call for any questions She has a 6 week f/u appointment for touch-up I also informed her that her pharmacy did request prior authorization for EMLA cream & A. Jearld Adjutant has forwarded the necessary documents to insurance- She said that she did not feel any discomfort during the initial procedure & most likely will not need the topical EMLA- if insurance does not approve the RX, she may not purchase it/out of pocket payment

## 2019-06-27 ENCOUNTER — Telehealth: Payer: Self-pay | Admitting: *Deleted

## 2019-06-27 MED ORDER — LIDOCAINE-PRILOCAINE 2.5-2.5 % EX CREA: 1.0000 "application " | TOPICAL_CREAM | CUTANEOUS | 0 refills | Status: DC | PRN

## 2019-06-27 NOTE — Telephone Encounter (Signed)
Called and spoke with the patient to let her know that I was unable to get PA done on the EMLA cream because her insurance [employer] (histocompatibility and immunogenetics)) has been canceled.  Patient stated that she has switched her insurance to [facility] she has also chanced her pharmacy to [facility].  She asked if I would sent the prescription to [facility].  Prescription sent to [facility].//AB/CMA

## 2019-07-01 DIAGNOSIS — E1169 Type 2 diabetes mellitus with other specified complication: Secondary | ICD-10-CM | POA: Diagnosis not present

## 2019-07-01 DIAGNOSIS — I10 Essential (primary) hypertension: Secondary | ICD-10-CM | POA: Diagnosis not present

## 2019-07-01 DIAGNOSIS — G4733 Obstructive sleep apnea (adult) (pediatric): Secondary | ICD-10-CM | POA: Diagnosis not present

## 2019-07-01 DIAGNOSIS — E039 Hypothyroidism, unspecified: Secondary | ICD-10-CM | POA: Diagnosis not present

## 2019-07-21 DIAGNOSIS — C50912 Malignant neoplasm of unspecified site of left female breast: Secondary | ICD-10-CM | POA: Diagnosis not present

## 2019-07-21 DIAGNOSIS — C50911 Malignant neoplasm of unspecified site of right female breast: Secondary | ICD-10-CM | POA: Diagnosis not present

## 2019-07-27 ENCOUNTER — Telehealth: Payer: Self-pay | Admitting: *Deleted

## 2019-07-27 NOTE — Telephone Encounter (Signed)
Received DME Standard Written Order (x2) via of fax on (07/21/19) from [facility].  Requesting a signature and to be returned.  Both orders signed and faxed back to [facility].  Confirmation received.  Copy scanned into the chart.//AB/CMA

## 2019-08-01 ENCOUNTER — Other Ambulatory Visit: Payer: Self-pay

## 2019-08-01 ENCOUNTER — Ambulatory Visit: Payer: Medicare HMO

## 2019-08-11 DIAGNOSIS — E890 Postprocedural hypothyroidism: Secondary | ICD-10-CM | POA: Diagnosis not present

## 2019-08-11 DIAGNOSIS — I10 Essential (primary) hypertension: Secondary | ICD-10-CM | POA: Diagnosis not present

## 2019-08-11 DIAGNOSIS — E876 Hypokalemia: Secondary | ICD-10-CM | POA: Diagnosis not present

## 2019-08-11 DIAGNOSIS — C73 Malignant neoplasm of thyroid gland: Secondary | ICD-10-CM | POA: Diagnosis not present

## 2019-08-15 ENCOUNTER — Telehealth: Payer: Self-pay | Admitting: Plastic Surgery

## 2019-08-15 DIAGNOSIS — I10 Essential (primary) hypertension: Secondary | ICD-10-CM | POA: Diagnosis not present

## 2019-08-15 DIAGNOSIS — C73 Malignant neoplasm of thyroid gland: Secondary | ICD-10-CM | POA: Diagnosis not present

## 2019-08-15 DIAGNOSIS — E876 Hypokalemia: Secondary | ICD-10-CM | POA: Diagnosis not present

## 2019-08-15 DIAGNOSIS — E890 Postprocedural hypothyroidism: Secondary | ICD-10-CM | POA: Diagnosis not present

## 2019-08-15 NOTE — Telephone Encounter (Signed)
Returned patients call. She just started feeling nauseated and gassy last week. Also the left side of her abdomen is harder than the right side.  Spoke with [place], he verified that the surgery was back in 01/2019 and the symptoms she is having now should not be coming from the surgery. Advised her to call her PC. Patient agreed and understood.

## 2019-08-15 NOTE — Telephone Encounter (Signed)
Patient said she had a surgery and that in the last week or two she has been feeling nauseous and gassy if something presses up against the area of her abdomen where the surgery was. For example, if she is sitting up and something is pressing against the area, she experiences symptoms. She said one side is more sensitive to the touch than the other. She isn't sure if the symptoms are related to the procedure but wanted to call. Please call her back to advise what can be done to alleviate these symptoms.

## 2019-08-17 ENCOUNTER — Other Ambulatory Visit (HOSPITAL_COMMUNITY): Payer: Self-pay | Admitting: Endocrinology

## 2019-08-17 ENCOUNTER — Other Ambulatory Visit: Payer: Self-pay | Admitting: Family Medicine

## 2019-08-17 DIAGNOSIS — R109 Unspecified abdominal pain: Secondary | ICD-10-CM

## 2019-08-17 DIAGNOSIS — K8020 Calculus of gallbladder without cholecystitis without obstruction: Secondary | ICD-10-CM

## 2019-08-17 DIAGNOSIS — R110 Nausea: Secondary | ICD-10-CM | POA: Diagnosis not present

## 2019-08-17 DIAGNOSIS — C73 Malignant neoplasm of thyroid gland: Secondary | ICD-10-CM | POA: Diagnosis not present

## 2019-08-17 DIAGNOSIS — G4733 Obstructive sleep apnea (adult) (pediatric): Secondary | ICD-10-CM | POA: Diagnosis not present

## 2019-08-17 DIAGNOSIS — I10 Essential (primary) hypertension: Secondary | ICD-10-CM | POA: Diagnosis not present

## 2019-08-17 DIAGNOSIS — R7301 Impaired fasting glucose: Secondary | ICD-10-CM | POA: Diagnosis not present

## 2019-08-18 ENCOUNTER — Ambulatory Visit
Admission: RE | Admit: 2019-08-18 | Discharge: 2019-08-18 | Disposition: A | Discharge status: Home / Self Care | Payer: Medicare HMO | Source: Ambulatory Visit | Attending: Family Medicine | Admitting: Family Medicine

## 2019-08-18 DIAGNOSIS — K760 Fatty (change of) liver, not elsewhere classified: Secondary | ICD-10-CM | POA: Diagnosis not present

## 2019-08-18 DIAGNOSIS — R110 Nausea: Secondary | ICD-10-CM

## 2019-08-18 DIAGNOSIS — R109 Unspecified abdominal pain: Secondary | ICD-10-CM

## 2019-08-18 DIAGNOSIS — K8020 Calculus of gallbladder without cholecystitis without obstruction: Secondary | ICD-10-CM

## 2019-08-19 DIAGNOSIS — K2100 Gastro-esophageal reflux disease with esophagitis, without bleeding: Secondary | ICD-10-CM | POA: Diagnosis not present

## 2019-08-19 DIAGNOSIS — K760 Fatty (change of) liver, not elsewhere classified: Secondary | ICD-10-CM | POA: Diagnosis not present

## 2019-08-19 DIAGNOSIS — R7301 Impaired fasting glucose: Secondary | ICD-10-CM | POA: Diagnosis not present

## 2019-08-19 DIAGNOSIS — I10 Essential (primary) hypertension: Secondary | ICD-10-CM | POA: Diagnosis not present

## 2019-08-22 DIAGNOSIS — Z01419 Encounter for gynecological examination (general) (routine) without abnormal findings: Secondary | ICD-10-CM | POA: Diagnosis not present

## 2019-08-29 ENCOUNTER — Other Ambulatory Visit (HOSPITAL_COMMUNITY): Payer: Medicare HMO

## 2019-08-30 ENCOUNTER — Other Ambulatory Visit (HOSPITAL_COMMUNITY): Payer: Medicare HMO

## 2019-08-31 ENCOUNTER — Other Ambulatory Visit (HOSPITAL_COMMUNITY): Payer: Medicare HMO

## 2019-09-02 ENCOUNTER — Other Ambulatory Visit (HOSPITAL_COMMUNITY): Payer: Medicare HMO

## 2019-09-06 ENCOUNTER — Ambulatory Visit (INDEPENDENT_AMBULATORY_CARE_PROVIDER_SITE_OTHER): Payer: Medicare HMO | Admitting: Plastic Surgery

## 2019-09-06 ENCOUNTER — Encounter: Payer: Self-pay | Admitting: Plastic Surgery

## 2019-09-06 ENCOUNTER — Other Ambulatory Visit: Payer: Self-pay

## 2019-09-06 VITALS — BP 164/91 | HR 78 | Temp 97.3°F | Ht 63.0 in | Wt 212.6 lb

## 2019-09-06 DIAGNOSIS — Z9011 Acquired absence of right breast and nipple: Secondary | ICD-10-CM

## 2019-09-06 DIAGNOSIS — Z9889 Other specified postprocedural states: Secondary | ICD-10-CM | POA: Diagnosis not present

## 2019-09-06 NOTE — Progress Notes (Signed)
   Subjective:    Patient ID: [name], female    DOB: [birth year], 69 y.o.   MRN: [id number]  Is a 69 year old female here for follow-up on her bilateral breast reconstruction.  She has started the nipple areola tattoo.  She is very pleased with the way that that is progressing.  She has the latissimus flap on the right.  There is also the side that was radiated.  She has nice symmetry.  There is no sign of infection, seroma or hematoma.  All her incisions are healing nicely.  She has noticed a little of tissue laterally on both breasts and she was wondering if this could be removed.     Review of Systems  Constitutional: Negative.   HENT: Negative.   Eyes: Negative.   Respiratory: Negative.   Cardiovascular: Negative.   Gastrointestinal: Negative.   Genitourinary: Negative.   Musculoskeletal: Negative.   Skin: Negative.   Hematological: Negative.   Psychiatric/Behavioral: Negative.        Objective:   Physical Exam Vitals and nursing note reviewed.  Constitutional:      Appearance: Normal appearance.  HENT:     Head: Normocephalic and atraumatic.  Cardiovascular:     Rate and Rhythm: Normal rate.     Pulses: Normal pulses.  Pulmonary:     Effort: Pulmonary effort is normal.  Skin:    General: Skin is warm.     Capillary Refill: Capillary refill takes less than 2 seconds.  Neurological:     General: No focal deficit present.     Mental Status: She is alert and oriented to person, place, and time.  Psychiatric:        Mood and Affect: Mood normal.        Behavior: Behavior normal.        Thought Content: Thought content normal.        Assessment & Plan:     ICD-10-CM   1. Acquired absence of right breast  Z90.11   2. S/P breast reconstruction, bilateral  Z98.890     The patient could have some fat removed by liposuction on the lateral aspect.  My concern with this is that she would have more skin hanging.  I would be very hesitant to excise it because  this could put her implant at risk.  The patient does not want to do anything that would risk her reconstruction so she said she will just wait and watch it.  Pictures were obtained of the patient and placed in the chart with the patient's or guardian's permission.

## 2019-09-12 ENCOUNTER — Ambulatory Visit (INDEPENDENT_AMBULATORY_CARE_PROVIDER_SITE_OTHER): Payer: Medicare HMO

## 2019-09-12 ENCOUNTER — Other Ambulatory Visit: Payer: Self-pay

## 2019-09-12 ENCOUNTER — Ambulatory Visit: Payer: Medicare HMO

## 2019-09-12 DIAGNOSIS — Z9889 Other specified postprocedural states: Secondary | ICD-10-CM | POA: Diagnosis not present

## 2019-09-15 NOTE — Patient Instructions (Signed)
Pt will keep area covered with vaseline/gauze Call for any concerns F/U- touch/up in 6 weeks

## 2019-09-15 NOTE — Progress Notes (Signed)
NIPPLE AREOLAR TATTOO PROCEDURE  PREOPERATIVE DIAGNOSIS:  Acquired absence of BILATERAL nipple areolar   POSTOPERATIVE DIAGNOSIS: Acquired absence of BILATERAL nipple areolar    PROCEDURES: BILATERAL nipple areolar tattoo   ATTENDING SURGEON: Dr. Lyndee Leo Sanger   ANESTHESIA:  EMLA  COMPLICATIONS: None.  JUSTIFICATION FOR PROCEDURE:  [name] is a 69 y.o. female with a history of breast cancer status post breast reconstruction.  The patient presents for bilatetral nipple areolar complex tattoo.  Risks, benefits, indications, and alternatives of the above described procedures were discussed with the patient and all the patient's questions were answered.   Pre-procedure photos obtained and entered into chart  DESCRIPTION OF PROCEDURE: After informed consent was obtained and proper identification of patient and surgical site was made, the patient was taken to the procedure room.  A time out was performed to confirm patient's identity, and surgical site.  Areola/nipple placement/measurements & shape were finalized with pt's approval. Colors/shades were chosen/approved by pt for NAC tattoo.  Pt placed in supine position, and pt prepped & draped in the usual sterile fashion. Using a (7) round and a (9) straight/medium tattoo needle/head on the Bomtech/Digitl Pop Deluxe handpiece/machine- pigment was instilled to the designed NAC.  Once adequate pigment had been applied to NAC- Post-procedure photos obtained and entered into chart Vaseline/gauze applied  The pt tolerated the procedure well  Pigment used: [employer] lot# [id number] exp. 04/06/2022 Warm Honey lot# [id number] / exp. 01/19/2022 Josph Macho Skin [id number] / exp 12/11/2022 Cool Honey lot# [id number] exp 01/25/2022 Equinox lot# [id number] exp 07/20/22 Duration-topical anesthesia liquid applied as needed during the procedure Sterile eye wash solution used to dilute/mix colors as  needed. Post-care instructions reviewed & given to pt Pt will call for any concerns F/U - 6 weeks for touch-up if needed

## 2019-09-16 NOTE — Progress Notes (Signed)
NIPPLE AREOLAR TATTOO PROCEDURE  PREOPERATIVE DIAGNOSIS:  Acquired absence of Bilateral nipple areolar   POSTOPERATIVE DIAGNOSIS: Acquired absence of bilateral nipple areolar    PROCEDURES: bilateral nipple areolar tattoo   ATTENDING SURGEON: Dr. Lyndee Leo Sanger   ANESTHESIA:  EMLA  COMPLICATIONS: None.  JUSTIFICATION FOR PROCEDURE:  Ms. Paradowski is a 69 y.o. female with a history of breast cancer status post bilateral breast reconstruction.  The patient presents for bilateral nipple areolar complex tattoo.  Risks, benefits, indications, and alternatives of the above described procedures were discussed with the patient and all the patient's questions were answered.  Pre-procedure photos obtained- entered into chart  DESCRIPTION OF PROCEDURE: After informed consent was obtained and proper identification of patient and surgical site was made, the patient was taken to the procedure room.  A time out was performed to confirm patient's identity and surgical site.  Areola/nipple placement/measurements & shape were finalized with pt's approval. Colors/shades were chosen/approved by pt for NAC tattoo Pt placed in supine position, and area was prepped & draped in the usual sterile fashion. Using a (7) round and a (9) straight/medium tattoo needles on the Bomtech/Digital Pop Deluxe Machine/handpiece- pigment was instilled to the designed NAC   Once adequate pigment had been applied to the nipple areolar complex  Post-procedure photos were obtained & entered into chart Vaseline/gauze dressing applied  The patient tolerated the procedure well.   Pigment used: [employer] Ink Cool Mink lot# [id number] exp 04/06/2022 Warm Honey lot# [id number] exp 01/19/22 Josph Macho Skin lot# [id number] exp 12/11/2022 Duration topical liquid anesthesia applied as needed Sterile eyewash solution used to dilute/mix colors as needed postcare instructions reviewed Pt will call for any concerns f/u  in 6 weeks

## 2019-09-16 NOTE — Patient Instructions (Signed)
Use vaseline/gauze  call for any concerns F/u for touch-up in 6 weeks

## 2019-10-10 ENCOUNTER — Other Ambulatory Visit (HOSPITAL_COMMUNITY): Payer: Medicare HMO

## 2019-10-11 ENCOUNTER — Other Ambulatory Visit (HOSPITAL_COMMUNITY): Payer: Medicare HMO

## 2019-10-12 ENCOUNTER — Other Ambulatory Visit (HOSPITAL_COMMUNITY): Payer: Medicare HMO

## 2019-10-14 ENCOUNTER — Other Ambulatory Visit (HOSPITAL_COMMUNITY): Payer: Medicare HMO

## 2019-10-18 DIAGNOSIS — M1812 Unilateral primary osteoarthritis of first carpometacarpal joint, left hand: Secondary | ICD-10-CM | POA: Diagnosis not present

## 2019-11-07 DIAGNOSIS — I10 Essential (primary) hypertension: Secondary | ICD-10-CM | POA: Diagnosis not present

## 2019-11-07 DIAGNOSIS — E1169 Type 2 diabetes mellitus with other specified complication: Secondary | ICD-10-CM | POA: Diagnosis not present

## 2019-11-17 DIAGNOSIS — I10 Essential (primary) hypertension: Secondary | ICD-10-CM | POA: Diagnosis not present

## 2019-11-17 DIAGNOSIS — Z135 Encounter for screening for eye and ear disorders: Secondary | ICD-10-CM | POA: Diagnosis not present

## 2019-11-17 DIAGNOSIS — H5200 Hypermetropia, unspecified eye: Secondary | ICD-10-CM | POA: Diagnosis not present

## 2019-11-17 DIAGNOSIS — Z0100 Encounter for examination of eyes and vision without abnormal findings: Secondary | ICD-10-CM | POA: Diagnosis not present

## 2019-11-28 ENCOUNTER — Ambulatory Visit (INDEPENDENT_AMBULATORY_CARE_PROVIDER_SITE_OTHER): Payer: Medicare HMO

## 2019-11-28 ENCOUNTER — Other Ambulatory Visit: Payer: Self-pay

## 2019-11-28 VITALS — BP 125/88 | HR 90 | Temp 97.9°F

## 2019-11-28 DIAGNOSIS — Z9889 Other specified postprocedural states: Secondary | ICD-10-CM

## 2019-11-28 NOTE — Patient Instructions (Signed)
Pt will keep area clean/dry & use vaseline & gauze dressing  She will call for any concerns F/u in 6 weeks

## 2019-11-28 NOTE — Progress Notes (Signed)
NIPPLE AREOLAR TATTOO PROCEDURE  PREOPERATIVE DIAGNOSIS:  Acquired absence of (BILATERAL) nipple areolar   POSTOPERATIVE DIAGNOSIS: Acquired absence of (BILATERAL) nipple areolar    PROCEDURES: (BILATERAL) nipple areolar tattoo   ATTENDING SURGEON: Dr. Floreen Comber  ANESTHESIA:  EMLA  COMPLICATIONS: None.  JUSTIFICATION FOR PROCEDURE:  [name] is a 69 y.o. female with a history of breast cancer status post bilateral breast reconstruction. The patient presents for nipple areolar complex tattoo touch-up Risks, benefits, indications, and alternatives of the above described procedures were discussed with the patient and all the patient's questions were answered.   DESCRIPTION OF PROCEDURE: After informed consent was obtained and proper identification of patient and surgical site was made, the patient was taken to the procedure room  where pre-procedure photos were obtained & entered into chart. Colors/shades were chosen for the NAC touch-up Pt was placed supine on the operating room table.  A time out was performed to confirm patient's identity and surgical site.  The patient was prepped and draped in the usual sterile fashion.  Using a #7 tattoo needle on the Bomtech tattoo machine- ink/pigment was instilled to the designed NAC Once adequate pigment had been applied to bilateral NAC post-procedure photos were obtained & entered into the chart. Although the pt tolerated the procedure well- it was noted that the left NAC area became very red/irritated-outside of the tattooed nipple complex. Ink used: [employer]- lot# [id number]/ exp 04/06/2022 Antique Gold-lot#[id number]/ exp 07/19/2022 [place]- lot# [id number]/ exp 01/21/2022 Fair honey- lot# [id number]/ exp 04/06/2022 Elvera Lennox- lot# [id number]/ exp 01/18/2022 Sterile eyewash was used to mix/dilute ink Duration- topical anesthesia used as needed Post care instructions were reviewed & pt will f/u in 6 weeks for possible  touch-up if needed

## 2019-12-20 DIAGNOSIS — K439 Ventral hernia without obstruction or gangrene: Secondary | ICD-10-CM | POA: Diagnosis not present

## 2019-12-20 DIAGNOSIS — D0512 Intraductal carcinoma in situ of left breast: Secondary | ICD-10-CM | POA: Diagnosis not present

## 2019-12-22 ENCOUNTER — Other Ambulatory Visit: Payer: Self-pay | Admitting: General Surgery

## 2019-12-22 DIAGNOSIS — K439 Ventral hernia without obstruction or gangrene: Secondary | ICD-10-CM

## 2019-12-27 DIAGNOSIS — Z03818 Encounter for observation for suspected exposure to other biological agents ruled out: Secondary | ICD-10-CM | POA: Diagnosis not present

## 2019-12-27 DIAGNOSIS — Z20822 Contact with and (suspected) exposure to covid-19: Secondary | ICD-10-CM | POA: Diagnosis not present

## 2020-01-03 ENCOUNTER — Ambulatory Visit
Admission: RE | Admit: 2020-01-03 | Discharge: 2020-01-03 | Disposition: A | Discharge status: Home / Self Care | Payer: Medicare HMO | Source: Ambulatory Visit | Attending: General Surgery | Admitting: General Surgery

## 2020-01-03 DIAGNOSIS — K449 Diaphragmatic hernia without obstruction or gangrene: Secondary | ICD-10-CM | POA: Diagnosis not present

## 2020-01-03 DIAGNOSIS — K439 Ventral hernia without obstruction or gangrene: Secondary | ICD-10-CM

## 2020-01-03 DIAGNOSIS — D259 Leiomyoma of uterus, unspecified: Secondary | ICD-10-CM | POA: Diagnosis not present

## 2020-01-03 DIAGNOSIS — K429 Umbilical hernia without obstruction or gangrene: Secondary | ICD-10-CM | POA: Diagnosis not present

## 2020-01-03 MED ORDER — IOPAMIDOL (ISOVUE-370) INJECTION 76%
75.0000 mL | Freq: Once | INTRAVENOUS | Status: AC | PRN
  Administered 2020-01-03: 75 mL via INTRAVENOUS

## 2020-01-30 DIAGNOSIS — E781 Pure hyperglyceridemia: Secondary | ICD-10-CM | POA: Diagnosis not present

## 2020-01-30 DIAGNOSIS — E890 Postprocedural hypothyroidism: Secondary | ICD-10-CM | POA: Diagnosis not present

## 2020-01-30 DIAGNOSIS — C73 Malignant neoplasm of thyroid gland: Secondary | ICD-10-CM | POA: Diagnosis not present

## 2020-01-30 DIAGNOSIS — E1169 Type 2 diabetes mellitus with other specified complication: Secondary | ICD-10-CM | POA: Diagnosis not present

## 2020-01-30 DIAGNOSIS — E7800 Pure hypercholesterolemia, unspecified: Secondary | ICD-10-CM | POA: Diagnosis not present

## 2020-01-30 DIAGNOSIS — E661 Drug-induced obesity: Secondary | ICD-10-CM | POA: Diagnosis not present

## 2020-01-30 DIAGNOSIS — I10 Essential (primary) hypertension: Secondary | ICD-10-CM | POA: Diagnosis not present

## 2020-02-01 NOTE — Progress Notes (Signed)
[place], [place] [address] Phone: [phone] Fax: [phone]      Your procedure is scheduled on 02/10/20.  Report to [facility] Main Entrance "A" at 6:30 A.M., and check in at the Admitting office.  Call this number if you have problems the morning of surgery:  [phone]  Call [phone] if you have any questions prior to your surgery date Monday-Friday 8am-4pm    Remember:  Do not eat or drink after midnight the night before your surgery    Take these medicines the morning of surgery with A SIP OF WATER: chlorthalidone (HYGROTON) SYNTHROID 88 MCG  As of today, STOP taking any Aspirin (unless otherwise instructed by your surgeon) Aleve, Naproxen, Ibuprofen, Motrin, Advil, Goody's, BC's, all herbal medications, fish oil, and all vitamins.                      Do not wear jewelry, make up, or nail polish            Do not wear lotions, powders, perfumes or deodorant.            Do not shave 48 hours prior to surgery.              Do not bring valuables to the hospital.            [facility] is not responsible for any belongings or valuables.  Do NOT Smoke (Tobacco/Vaping) or drink Alcohol 24 hours prior to your procedure If you use a CPAP at night, you may bring all equipment for your overnight stay.   Contacts, glasses, dentures or bridgework may not be worn into surgery.      For patients admitted to the hospital, discharge time will be determined by your treatment team.   Patients discharged the day of surgery will not be allowed to drive home, and someone needs to stay with them for 24 hours.    Special instructions:   [place]- Preparing For Surgery  Before surgery, you can play an important role. Because skin is not sterile, your skin needs to be as free of germs as possible. You can reduce the number of germs on your skin by washing with CHG (chlorahexidine  gluconate) Soap before surgery.  CHG is an antiseptic cleaner which kills germs and bonds with the skin to continue killing germs even after washing.    Oral Hygiene is also important to reduce your risk of infection.  Remember - BRUSH YOUR TEETH THE MORNING OF SURGERY WITH YOUR REGULAR TOOTHPASTE  Please do not use if you have an allergy to CHG or antibacterial soaps. If your skin becomes reddened/irritated stop using the CHG.  Do not shave (including legs and underarms) for at least 48 hours prior to first CHG shower. It is OK to shave your face.  Please follow these instructions carefully.   1. Shower the NIGHT BEFORE SURGERY and the MORNING OF SURGERY with CHG Soap.   2. If you chose to wash your hair, wash your hair first as usual with your normal shampoo.  3. After you shampoo, rinse your hair and body thoroughly to remove the shampoo.  4. Use CHG as you would any other liquid soap. You can apply CHG directly to the skin and wash gently with a scrungie or a clean washcloth.   5. Apply the CHG Soap to your body ONLY FROM THE NECK DOWN.  Do not use on open wounds or open sores. Avoid contact with your eyes, ears, mouth and genitals (private parts). Wash Face and genitals (private parts)  with your normal soap.   6. Wash thoroughly, paying special attention to the area where your surgery will be performed.  7. Thoroughly rinse your body with warm water from the neck down.  8. DO NOT shower/wash with your normal soap after using and rinsing off the CHG Soap.  9. Pat yourself dry with a CLEAN TOWEL.  10. Wear CLEAN PAJAMAS to bed the night before surgery  11. Place CLEAN SHEETS on your bed the night of your first shower and DO NOT SLEEP WITH PETS.   Day of Surgery: Wear Clean/Comfortable clothing the morning of surgery Do not apply any deodorants/lotions.   Remember to brush your teeth WITH YOUR REGULAR TOOTHPASTE.   Please read over the following fact sheets that you were  given.

## 2020-02-02 ENCOUNTER — Other Ambulatory Visit: Payer: Self-pay

## 2020-02-02 ENCOUNTER — Encounter (HOSPITAL_COMMUNITY)
Admission: RE | Admit: 2020-02-02 | Discharge: 2020-02-02 | Disposition: A | Discharge status: Home / Self Care | Payer: Medicare HMO | Source: Ambulatory Visit | Attending: General Surgery | Admitting: General Surgery

## 2020-02-02 ENCOUNTER — Encounter (HOSPITAL_COMMUNITY): Payer: Self-pay

## 2020-02-02 NOTE — Progress Notes (Addendum)
PCP - Dr. Criss Rosales  Cardiologist - none  Chest x-ray - na  EKG - 02/21/19 - requested one done at PCP's office on 01/30/20  Stress Test - none  ECHO - 2013  Cardiac Cath - no  Sleep Study - 09/2018 CPAP - yes- has not gotten used to it.- I encouraged patient to try it every night and to bring the mask with her.  LABS-CBC BMP- patient had CBC with Diff and CMP at PCP's officwe on 01/30/20- I have requested.  ASA-no  ERAS-no  HA1C-na Fasting Blood Sugar - na Checks Blood Sugar _0____ times a day  Anesthesia-  Pt denies having chest pain, sob, or fever at this time. All instructions explained to the pt, with a verbal understanding of the material. Pt agrees to go over the instructions while at home for a better understanding. Pt also instructed to self quarantine after being tested for COVID-19. The opportunity to ask questions was provided.

## 2020-02-02 NOTE — Progress Notes (Signed)
Your procedure is scheduled on 02/10/20.  Report to [facility] Main Entrance "A" at 6:30 A.M., and check in at the Admitting office.  Call this number if you have problems the morning of surgery:  [phone]  Call [phone] if you have any questions prior to your surgery date Monday-Friday 8am-4pm   Remember:  Do not eat or drink after midnight the night before your surgery    Take these medicines the morning of surgery with A SIP OF WATER: SYNTHROID 88 MCG  As of today, STOP taking any Aspirin (unless otherwise instructed by your surgeon) Aleve, Naproxen, Ibuprofen, Motrin, Advil, Goody's, BC's, all herbal medications, fish oil, and all vitamins  Special instructions:   [place]- Preparing For Surgery  Before surgery, you can play an important role. Because skin is not sterile, your skin needs to be as free of germs as possible. You can reduce the number of germs on your skin by washing with CHG (chlorahexidine gluconate) Soap before surgery.  CHG is an antiseptic cleaner which kills germs and bonds with the skin to continue killing germs even after washing.    Oral Hygiene is also important to reduce your risk of infection.  Remember - BRUSH YOUR TEETH THE MORNING OF SURGERY WITH YOUR REGULAR TOOTHPASTE  Please do not use if you have an allergy to CHG or antibacterial soaps. If your skin becomes reddened/irritated stop using the CHG.  Do not shave (including legs and underarms) for at least 48 hours prior to first CHG shower. It is OK to shave your face.  Please follow these instructions carefully.   1. Shower the NIGHT BEFORE SURGERY and the MORNING OF SURGERY with CHG Soap.   2. If you chose to wash your hair, wash your hair first as usual with your normal shampoo.  3. After you shampoo, wash your face and private area with the soap you use at home, then rinse your hair and body thoroughly to remove the shampoo and soap.   4. Use CHG as you would any other liquid  soap. You can apply CHG directly to the skin and wash gently with a scrungie or a clean washcloth.   5. Apply the CHG Soap to your body ONLY FROM THE NECK DOWN.  Do not use on open wounds or open sores. Avoid contact with your eyes, ears, mouth and genitals (private parts).   6. Wash thoroughly, paying special attention to the area where your surgery will be performed.  7. Thoroughly rinse your body with warm water from the neck down.  8. DO NOT shower/wash with your normal soap after using and rinsing off the CHG Soap.  9. Pat yourself dry with a CLEAN TOWEL.  10. Wear CLEAN PAJAMAS to bed the night before surgery  11. Place CLEAN SHEETS on your bed the night of your first shower and DO NOT SLEEP WITH PETS.   Day of Surgery: Shower as instructed above.            Do not wear lotions, powders, perfumes or deodorant.     Wear Clean/Comfortable clothing the morning of surgery Remember to brush your teeth WITH YOUR REGULAR TOOTHPASTE.     Do not wear jewelry, make up, or nail polish             Do not wear lotions, powders, perfumes or deodorant.            Do not shave 48 hours prior to surgery.  Do not bring valuables to the hospital.            [facility] is not responsible for any belongings or valuables.  Do NOT Smoke (Tobacco/Vaping) or drink Alcohol 24 hours prior to your procedure If you use a CPAP at night, you may bring all equipment for your overnight stay.   Contacts, glasses, dentures or bridgework may not be worn into surgery.      For patients admitted to the hospital, discharge time will be determined by your treatment team.   Patients discharged the day of surgery will not be allowed to drive home, and someone needs to stay with them for 24 hours.  Please read over the fact sheets that you were given: Coughing and Deep Breathing, Pain Booklet, and Surgical Site Infections

## 2020-02-03 ENCOUNTER — Ambulatory Visit: Payer: Self-pay | Admitting: General Surgery

## 2020-02-06 ENCOUNTER — Ambulatory Visit: Payer: Medicare HMO

## 2020-02-06 DIAGNOSIS — I10 Essential (primary) hypertension: Secondary | ICD-10-CM | POA: Diagnosis not present

## 2020-02-06 DIAGNOSIS — E890 Postprocedural hypothyroidism: Secondary | ICD-10-CM | POA: Diagnosis not present

## 2020-02-06 DIAGNOSIS — C73 Malignant neoplasm of thyroid gland: Secondary | ICD-10-CM | POA: Diagnosis not present

## 2020-02-06 DIAGNOSIS — E876 Hypokalemia: Secondary | ICD-10-CM | POA: Diagnosis not present

## 2020-02-07 ENCOUNTER — Other Ambulatory Visit (HOSPITAL_COMMUNITY)
Admission: RE | Admit: 2020-02-07 | Discharge: 2020-02-07 | Disposition: A | Discharge status: Home / Self Care | Payer: Medicare HMO | Source: Ambulatory Visit | Attending: General Surgery | Admitting: General Surgery

## 2020-02-07 DIAGNOSIS — Z20822 Contact with and (suspected) exposure to covid-19: Secondary | ICD-10-CM | POA: Diagnosis not present

## 2020-02-07 DIAGNOSIS — Z01812 Encounter for preprocedural laboratory examination: Secondary | ICD-10-CM | POA: Insufficient documentation

## 2020-02-07 LAB — SARS CORONAVIRUS 2 (TAT 6-24 HRS): SARS Coronavirus 2: NEGATIVE

## 2020-02-07 NOTE — Anesthesia Preprocedure Evaluation (Addendum)
Anesthesia Evaluation  Patient identified by MRN, date of birth, ID band Patient awake    Reviewed: Allergy & Precautions, NPO status   Airway Mallampati: II       Dental   Pulmonary    breath sounds clear to auscultation       Cardiovascular hypertension,  Rhythm:Regular Rate:Normal     Neuro/Psych    GI/Hepatic Neg liver ROS, History noted   Endo/Other  Hypothyroidism   Renal/GU      Musculoskeletal  (+) Arthritis ,   Abdominal   Peds  Hematology  (+) anemia ,   Anesthesia Other Findings   Reproductive/Obstetrics                            Anesthesia Physical Anesthesia Plan  ASA: III  Anesthesia Plan: General   Post-op Pain Management:    Induction: Intravenous  PONV Risk Score and Plan: 3 and Dexamethasone, Ondansetron and Midazolam  Airway Management Planned: Oral ETT  Additional Equipment:   Intra-op Plan:   Post-operative Plan: Extubation in OR  Informed Consent: I have reviewed the patients History and Physical, chart, labs and discussed the procedure including the risks, benefits and alternatives for the proposed anesthesia with the patient or authorized representative who has indicated his/her understanding and acceptance.     Dental advisory given  Plan Discussed with: CRNA and Anesthesiologist  Anesthesia Plan Comments: (CMP and CBC with differential done at PCP office 01/30/2020 were reviewed, unremarkable.  Copy of results on patient's chart.)       Anesthesia Quick Evaluation

## 2020-02-08 ENCOUNTER — Ambulatory Visit: Payer: Medicare HMO

## 2020-02-09 ENCOUNTER — Ambulatory Visit (INDEPENDENT_AMBULATORY_CARE_PROVIDER_SITE_OTHER): Payer: Self-pay

## 2020-02-09 ENCOUNTER — Other Ambulatory Visit: Payer: Self-pay

## 2020-02-09 DIAGNOSIS — Z9889 Other specified postprocedural states: Secondary | ICD-10-CM

## 2020-02-10 ENCOUNTER — Encounter (HOSPITAL_COMMUNITY): Payer: Self-pay | Admitting: General Surgery

## 2020-02-10 ENCOUNTER — Ambulatory Visit (HOSPITAL_COMMUNITY)
Admission: RE | Admit: 2020-02-10 | Discharge: 2020-02-10 | Disposition: A | Discharge status: Home / Self Care | Payer: Medicare HMO | Attending: General Surgery | Admitting: General Surgery

## 2020-02-10 ENCOUNTER — Ambulatory Visit (HOSPITAL_COMMUNITY): Payer: Medicare HMO | Admitting: Physician Assistant

## 2020-02-10 ENCOUNTER — Ambulatory Visit (HOSPITAL_COMMUNITY): Payer: Medicare HMO | Admitting: Anesthesiology

## 2020-02-10 ENCOUNTER — Encounter (HOSPITAL_COMMUNITY)
Admission: RE | Disposition: A | Discharge status: Home / Self Care | Payer: Self-pay | Source: Home / Self Care | Attending: General Surgery

## 2020-02-10 DIAGNOSIS — C73 Malignant neoplasm of thyroid gland: Secondary | ICD-10-CM | POA: Diagnosis not present

## 2020-02-10 DIAGNOSIS — K439 Ventral hernia without obstruction or gangrene: Secondary | ICD-10-CM | POA: Diagnosis not present

## 2020-02-10 DIAGNOSIS — E039 Hypothyroidism, unspecified: Secondary | ICD-10-CM | POA: Diagnosis not present

## 2020-02-10 DIAGNOSIS — M1990 Unspecified osteoarthritis, unspecified site: Secondary | ICD-10-CM | POA: Insufficient documentation

## 2020-02-10 DIAGNOSIS — Z79899 Other long term (current) drug therapy: Secondary | ICD-10-CM | POA: Insufficient documentation

## 2020-02-10 DIAGNOSIS — Z9012 Acquired absence of left breast and nipple: Secondary | ICD-10-CM | POA: Diagnosis not present

## 2020-02-10 DIAGNOSIS — Z853 Personal history of malignant neoplasm of breast: Secondary | ICD-10-CM | POA: Diagnosis not present

## 2020-02-10 DIAGNOSIS — I10 Essential (primary) hypertension: Secondary | ICD-10-CM | POA: Insufficient documentation

## 2020-02-10 DIAGNOSIS — D649 Anemia, unspecified: Secondary | ICD-10-CM | POA: Insufficient documentation

## 2020-02-10 HISTORY — PX: INSERTION OF MESH: SHX5868

## 2020-02-10 HISTORY — PX: VENTRAL HERNIA REPAIR: SHX424

## 2020-02-10 SURGERY — REPAIR, HERNIA, VENTRAL, LAPAROSCOPIC
Anesthesia: General | Site: Abdomen

## 2020-02-10 MED ORDER — FENTANYL CITRATE (PF) 250 MCG/5ML IJ SOLN
INTRAMUSCULAR | Status: DC | PRN
Start: 2020-02-10 — End: 2020-02-10
  Administered 2020-02-10: 150 ug via INTRAVENOUS

## 2020-02-10 MED ORDER — GABAPENTIN 300 MG PO CAPS
300.0000 mg | ORAL_CAPSULE | ORAL | Status: AC
  Administered 2020-02-10: 300 mg via ORAL
  Filled 2020-02-10: qty 1

## 2020-02-10 MED ORDER — HYDROCODONE-ACETAMINOPHEN 5-325 MG PO TABS: 1.0000 | ORAL_TABLET | Freq: Four times a day (QID) | ORAL | 0 refills | Status: DC | PRN

## 2020-02-10 MED ORDER — ONDANSETRON HCL 4 MG/2ML IJ SOLN
INTRAMUSCULAR | Status: DC | PRN
  Administered 2020-02-10: 4 mg via INTRAVENOUS

## 2020-02-10 MED ORDER — SUGAMMADEX SODIUM 200 MG/2ML IV SOLN
INTRAVENOUS | Status: DC | PRN
  Administered 2020-02-10: 100 mg via INTRAVENOUS
  Administered 2020-02-10: 150 mg via INTRAVENOUS

## 2020-02-10 MED ORDER — CHLORHEXIDINE GLUCONATE 0.12 % MT SOLN
OROMUCOSAL | Status: AC
  Administered 2020-02-10: 15 mL via OROMUCOSAL
  Filled 2020-02-10: qty 15

## 2020-02-10 MED ORDER — FENTANYL CITRATE (PF) 100 MCG/2ML IJ SOLN: 25.0000 ug | INTRAMUSCULAR | Status: DC | PRN

## 2020-02-10 MED ORDER — MIDAZOLAM HCL 2 MG/2ML IJ SOLN
INTRAMUSCULAR | Status: AC
  Filled 2020-02-10: qty 2

## 2020-02-10 MED ORDER — FENTANYL CITRATE (PF) 250 MCG/5ML IJ SOLN
INTRAMUSCULAR | Status: AC
  Filled 2020-02-10: qty 5

## 2020-02-10 MED ORDER — ALBUTEROL SULFATE (2.5 MG/3ML) 0.083% IN NEBU
INHALATION_SOLUTION | RESPIRATORY_TRACT | Status: AC
  Administered 2020-02-10: 2.5 mg via RESPIRATORY_TRACT
  Filled 2020-02-10: qty 3

## 2020-02-10 MED ORDER — PROPOFOL 10 MG/ML IV BOLUS
INTRAVENOUS | Status: DC | PRN
  Administered 2020-02-10: 160 mg via INTRAVENOUS

## 2020-02-10 MED ORDER — AMISULPRIDE (ANTIEMETIC) 5 MG/2ML IV SOLN
INTRAVENOUS | Status: AC
  Administered 2020-02-10: 10 mg via INTRAVENOUS
  Filled 2020-02-10: qty 2

## 2020-02-10 MED ORDER — BUPIVACAINE HCL (PF) 0.25 % IJ SOLN
INTRAMUSCULAR | Status: AC
  Filled 2020-02-10: qty 30

## 2020-02-10 MED ORDER — LACTATED RINGERS IV SOLN: INTRAVENOUS | Status: DC

## 2020-02-10 MED ORDER — ACETAMINOPHEN 500 MG PO TABS
1000.0000 mg | ORAL_TABLET | ORAL | Status: AC
  Administered 2020-02-10: 1000 mg via ORAL
  Filled 2020-02-10: qty 2

## 2020-02-10 MED ORDER — ROCURONIUM BROMIDE 10 MG/ML (PF) SYRINGE
PREFILLED_SYRINGE | INTRAVENOUS | Status: DC | PRN
  Administered 2020-02-10: 20 mg via INTRAVENOUS
  Administered 2020-02-10: 50 mg via INTRAVENOUS
  Administered 2020-02-10: 10 mg via INTRAVENOUS

## 2020-02-10 MED ORDER — CHLORHEXIDINE GLUCONATE 0.12 % MT SOLN: 15.0000 mL | Freq: Once | OROMUCOSAL | Status: AC

## 2020-02-10 MED ORDER — MIDAZOLAM HCL 2 MG/2ML IJ SOLN
INTRAMUSCULAR | Status: DC | PRN
  Administered 2020-02-10: 2 mg via INTRAVENOUS

## 2020-02-10 MED ORDER — PROPOFOL 10 MG/ML IV BOLUS
INTRAVENOUS | Status: AC
  Filled 2020-02-10: qty 20

## 2020-02-10 MED ORDER — ALBUTEROL SULFATE (2.5 MG/3ML) 0.083% IN NEBU: 2.5000 mg | INHALATION_SOLUTION | Freq: Four times a day (QID) | RESPIRATORY_TRACT | Status: DC | PRN

## 2020-02-10 MED ORDER — LIDOCAINE 2% (20 MG/ML) 5 ML SYRINGE
INTRAMUSCULAR | Status: DC | PRN
  Administered 2020-02-10: 60 mg via INTRAVENOUS

## 2020-02-10 MED ORDER — CHLORHEXIDINE GLUCONATE CLOTH 2 % EX PADS: 6.0000 | MEDICATED_PAD | Freq: Once | CUTANEOUS | Status: DC

## 2020-02-10 MED ORDER — ORAL CARE MOUTH RINSE: 15.0000 mL | Freq: Once | OROMUCOSAL | Status: AC

## 2020-02-10 MED ORDER — HYDROMORPHONE HCL 1 MG/ML IJ SOLN: 0.2500 mg | INTRAMUSCULAR | Status: DC | PRN

## 2020-02-10 MED ORDER — CEFAZOLIN SODIUM-DEXTROSE 2-4 GM/100ML-% IV SOLN
2.0000 g | INTRAVENOUS | Status: AC
  Administered 2020-02-10: 2 g via INTRAVENOUS
  Filled 2020-02-10: qty 100

## 2020-02-10 MED ORDER — 0.9 % SODIUM CHLORIDE (POUR BTL) OPTIME
TOPICAL | Status: DC | PRN
  Administered 2020-02-10: 1000 mL

## 2020-02-10 MED ORDER — DEXAMETHASONE SODIUM PHOSPHATE 10 MG/ML IJ SOLN
INTRAMUSCULAR | Status: DC | PRN
  Administered 2020-02-10: 10 mg via INTRAVENOUS

## 2020-02-10 MED ORDER — AMISULPRIDE (ANTIEMETIC) 5 MG/2ML IV SOLN: 10.0000 mg | Freq: Once | INTRAVENOUS | Status: AC

## 2020-02-10 MED ORDER — CELECOXIB 200 MG PO CAPS
200.0000 mg | ORAL_CAPSULE | ORAL | Status: AC
  Administered 2020-02-10: 200 mg via ORAL
  Filled 2020-02-10: qty 1

## 2020-02-10 MED ORDER — BUPIVACAINE HCL (PF) 0.25 % IJ SOLN
INTRAMUSCULAR | Status: DC | PRN
  Administered 2020-02-10: 30 mL

## 2020-02-10 SURGICAL SUPPLY — 48 items
ADH SKN CLS APL DERMABOND .7 (GAUZE/BANDAGES/DRESSINGS) ×1
APL PRP STRL LF DISP 70% ISPRP (MISCELLANEOUS) ×1
BINDER ABDOMINAL 12 ML 46-62 (SOFTGOODS) ×1 IMPLANT
BNDG GAUZE ELAST 4 BULKY (GAUZE/BANDAGES/DRESSINGS) ×1 IMPLANT
CANISTER SUCT 3000ML PPV (MISCELLANEOUS) ×1 IMPLANT
CHLORAPREP W/TINT 26 (MISCELLANEOUS) ×2 IMPLANT
COVER SURGICAL LIGHT HANDLE (MISCELLANEOUS) ×2 IMPLANT
COVER WAND RF STERILE (DRAPES) ×2 IMPLANT
DERMABOND ADVANCED (GAUZE/BANDAGES/DRESSINGS) ×1
DERMABOND ADVANCED .7 DNX12 (GAUZE/BANDAGES/DRESSINGS) ×1 IMPLANT
DEVICE SECURE STRAP 25 ABSORB (INSTRUMENTS) ×3 IMPLANT
DEVICE TROCAR PUNCTURE CLOSURE (ENDOMECHANICALS) ×2 IMPLANT
DRAPE INCISE IOBAN 66X45 STRL (DRAPES) ×2 IMPLANT
DRAPE LAPAROSCOPIC ABDOMINAL (DRAPES) ×2 IMPLANT
ELECT CAUTERY BLADE 6.4 (BLADE) ×2 IMPLANT
ELECT REM PT RETURN 9FT ADLT (ELECTROSURGICAL) ×2
ELECTRODE REM PT RTRN 9FT ADLT (ELECTROSURGICAL) ×1 IMPLANT
GAUZE SPONGE 4X4 12PLY STRL (GAUZE/BANDAGES/DRESSINGS) ×1 IMPLANT
GLOVE BIO SURGEON STRL SZ7.5 (GLOVE) ×2 IMPLANT
GOWN STRL REUS W/ TWL LRG LVL3 (GOWN DISPOSABLE) ×3 IMPLANT
GOWN STRL REUS W/TWL LRG LVL3 (GOWN DISPOSABLE) ×6
KIT BASIN OR (CUSTOM PROCEDURE TRAY) ×2 IMPLANT
KIT TURNOVER KIT B (KITS) ×2 IMPLANT
MARKER SKIN DUAL TIP RULER LAB (MISCELLANEOUS) ×2 IMPLANT
MESH VENTRALIGHT ST 6X8 (Mesh Specialty) ×2 IMPLANT
MESH VENTRLGHT ELLIPSE 8X6XMFL (Mesh Specialty) IMPLANT
NDL SPNL 22GX3.5 QUINCKE BK (NEEDLE) ×1 IMPLANT
NEEDLE SPNL 22GX3.5 QUINCKE BK (NEEDLE) ×2 IMPLANT
NS IRRIG 1000ML POUR BTL (IV SOLUTION) ×2 IMPLANT
PAD ARMBOARD 7.5X6 YLW CONV (MISCELLANEOUS) ×4 IMPLANT
PENCIL BUTTON HOLSTER BLD 10FT (ELECTRODE) ×2 IMPLANT
SCISSORS LAP 5X35 DISP (ENDOMECHANICALS) ×1 IMPLANT
SET IRRIG TUBING LAPAROSCOPIC (IRRIGATION / IRRIGATOR) IMPLANT
SET TUBE SMOKE EVAC HIGH FLOW (TUBING) ×2 IMPLANT
SHEARS HARMONIC ACE PLUS 36CM (ENDOMECHANICALS) ×1 IMPLANT
SLEEVE ENDOPATH XCEL 5M (ENDOMECHANICALS) ×3 IMPLANT
SUT MNCRL AB 4-0 PS2 18 (SUTURE) ×2 IMPLANT
SUT NOVA NAB DX-16 0-1 5-0 T12 (SUTURE) ×2 IMPLANT
SUT VIC AB 3-0 SH 27 (SUTURE) ×2
SUT VIC AB 3-0 SH 27XBRD (SUTURE) ×1 IMPLANT
TOWEL GREEN STERILE (TOWEL DISPOSABLE) ×2 IMPLANT
TOWEL GREEN STERILE FF (TOWEL DISPOSABLE) ×2 IMPLANT
TRAY FOLEY W/BAG SLVR 14FR (SET/KITS/TRAYS/PACK) ×1 IMPLANT
TRAY LAPAROSCOPIC MC (CUSTOM PROCEDURE TRAY) ×2 IMPLANT
TROCAR XCEL BLUNT TIP 100MML (ENDOMECHANICALS) IMPLANT
TROCAR XCEL NON-BLD 11X100MML (ENDOMECHANICALS) IMPLANT
TROCAR XCEL NON-BLD 5MMX100MML (ENDOMECHANICALS) ×2 IMPLANT
WATER STERILE IRR 1000ML POUR (IV SOLUTION) ×2 IMPLANT

## 2020-02-10 NOTE — H&P (Signed)
[name]  Location: [facility] Surgery Patient #: [id number] DOB: 11/07/1950 Single / Language: Cleophus Molt / Race: Black or African American Female   History of Present Illness The patient is a 69 year old female who presents for a follow-up for Abdominal pain. 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 She had the reconstruction performed by Dr. Marla Roe. She tolerated all that well. More recently she has developed an area of discomfort just to the left of her midline incision near the umbilicus. She has had some episodes of nausea but no vomiting. She is concerned that she may have a hernia. She has had a C-section in the past with a large vertical incision on the midline.   Allergies  No Known Drug Allergies    Medication History  Chlorthalidone (25MG  Tablet, Oral) Active. Synthroid (88MCG Tablet, Oral) Active. Pantoprazole Sodium (40MG  Tablet DR, Oral) Active. Vitamin D (Oral) Specific strength unknown - Active. Medications Reconciled    Review of Systems  General Not Present- Appetite Loss, Chills, Fatigue, Fever, Night Sweats, Weight Gain and Weight Loss. Skin Not Present- Change in Wart/Mole, Dryness, Hives, Jaundice, New Lesions, Non-Healing Wounds, Rash and Ulcer. HEENT Not Present- Earache, Hearing Loss, Hoarseness, Nose Bleed, Oral Ulcers, Ringing in the Ears, Seasonal Allergies, Sinus Pain, Sore Throat, Visual Disturbances, Wears glasses/contact lenses and Yellow Eyes. Respiratory Not Present- Bloody sputum, Chronic Cough, Difficulty Breathing, Snoring and Wheezing. Breast Not Present- Breast Mass, Breast Pain, Nipple Discharge and Skin Changes. Cardiovascular Present- Leg Cramps. Not Present- Chest Pain, Difficulty Breathing Lying Down, Palpitations, Rapid Heart Rate, Shortness of Breath and Swelling of  Extremities. Gastrointestinal Not Present- Abdominal Pain, Bloating, Bloody Stool, Change in Bowel Habits, Chronic diarrhea, Constipation, Difficulty Swallowing, Excessive gas, Gets full quickly at meals, Hemorrhoids, Indigestion, Nausea, Rectal Pain and Vomiting. Female Genitourinary Not Present- Frequency, Nocturia, Painful Urination, Pelvic Pain and Urgency. Musculoskeletal Present- Back Pain. Not Present- Joint Pain, Joint Stiffness, Muscle Pain, Muscle Weakness and Swelling of Extremities. Neurological Not Present- Decreased Memory, Fainting, Headaches, Numbness, Seizures, Tingling, Tremor, Trouble walking and Weakness. Psychiatric Not Present- Anxiety, Bipolar, Change in Sleep Pattern, Depression, Fearful and Frequent crying. Endocrine Not Present- Cold Intolerance, Excessive Hunger, Hair Changes, Heat Intolerance, Hot flashes and New Diabetes. Hematology Not Present- Blood Thinners, Easy Bruising, Excessive bleeding, Gland problems, HIV and Persistent Infections.  Vitals  Weight: 209.5 lb Height: 64in Body Surface Area: 1.99 m Body Mass Index: 35.96 kg/m  Temp.: 97.8F  Pulse: 90 (Regular)  BP: 126/84(Sitting, Left Arm, Standard)       Physical Exam  General Mental Status-Alert. General Appearance-Consistent with stated age. Hydration-Well hydrated. Voice-Normal.  Head and Neck Head-normocephalic, atraumatic with no lesions or palpable masses. Trachea-midline. Thyroid Gland Characteristics - normal size and consistency.  Eye Eyeball - Bilateral-Extraocular movements intact. Sclera/Conjunctiva - Bilateral-No scleral icterus.  Chest and Lung Exam Chest and lung exam reveals -quiet, even and easy respiratory effort with no use of accessory muscles and on auscultation, normal breath sounds, no adventitious sounds and normal vocal resonance. Inspection Chest Wall - Normal. Back - normal.  Cardiovascular Cardiovascular examination reveals  -normal heart sounds, regular rate and rhythm with no murmurs and normal pedal pulses bilaterally.  Abdomen Note: The abdomen is soft and mostly nontender. There is a large well-healed midline incision. There appears to be a palpable bulge that is tender to palpation  just to the left of midline near the umbilicus. I cannot feel a fascial defect at this location.   Neurologic Neurologic evaluation reveals -alert and oriented x 3 with no impairment of recent or remote memory. Mental Status-Normal.  Musculoskeletal Normal Exam - Left-Upper Extremity Strength Normal and Lower Extremity Strength Normal. Normal Exam - Right-Upper Extremity Strength Normal and Lower Extremity Strength Normal.  Lymphatic Head & Neck  General Head & Neck Lymphatics: Bilateral - Description - Normal. Axillary  General Axillary Region: Bilateral - Description - Normal. Tenderness - Non Tender. Femoral & Inguinal  Generalized Femoral & Inguinal Lymphatics: Bilateral - Description - Normal. Tenderness - Non Tender.    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 More recently she has developed some pain and bulging of the left side of the abdomen. I suspect that she has a ventral hernia at the location of the bulging. Because of the risk of incarceration and strangulation and do think she would benefit from having this hernia fixed. She would also like to have this done. I have discussed with her in detail the risks and benefits of the operation to fix the hernia as well as some of the technical aspects including the use of mesh and the risk of injury to the intestine depending on the amount of scar tissue that she has and she understands and wishes to proceed. I  would like to evaluate her with a CT scan of the abdomen and pelvis to help with surgical planning. We will call her with the results of the scan and then proceed accordingly. This patient encounter took 45 minutes today to perform the following: take history, perform exam, review outside records, interpret imaging, counsel the patient on their diagnosis and document encounter, findings & plan in the EHR Current Plans [place] ([id number])

## 2020-02-10 NOTE — Op Note (Signed)
02/10/2020  11:00 AM  PATIENT:  [name]  69 y.o. female  PRE-OPERATIVE DIAGNOSIS:  VENTRAL HERNIA  POST-OPERATIVE DIAGNOSIS:  VENTRAL HERNIA  PROCEDURE:  Procedure(s): LAPAROSCOPIC ASSISTED VENTRAL HERNIA REPAIR (N/A) INSERTION OF MESH (N/A)  SURGEON:  Surgeon(s) and Role:    * Jovita Kussmaul, MD - Primary  PHYSICIAN ASSISTANT:   ASSISTANTS: Dr. Rob Hickman   ANESTHESIA:   local and general  EBL:  5 mL   BLOOD ADMINISTERED:none  DRAINS: none   LOCAL MEDICATIONS USED:  MARCAINE     SPECIMEN:  Source of Specimen:  hernia sac  DISPOSITION OF SPECIMEN:  PATHOLOGY  COUNTS:  YES  TOURNIQUET:  * No tourniquets in log *  DICTATION: .Dragon Dictation   After informed consent was obtained the patient was brought to the operating room and placed in the supine position on the operating table.  After adequate induction of general anesthesia the patient's abdomen was prepped with ChloraPrep, allowed to dry, and draped in usual sterile manner including the use of an Ioban drape.  An appropriate timeout was performed.  A site was chosen to access the abdominal cavity in the right upper quadrant.  This area was infiltrated with quarter percent Marcaine.  A small stab incision was made with a 15 blade knife.  A 5 mm Optiview port and camera were used to bluntly dissected the layers of the abdominal wall under direct vision until access was gained to the abdominal cavity.  The abdomen was then insufflated with carbon dioxide without difficulty.  Another 5 mm port was placed in the right lower quadrant under direct vision as well as in the left mid abdomen under direct vision.  There were adhesions of omentum to the anterior abdominal wall.  These were taken down sharply with the harmonic scalpel.  I was able to identify the dominant hernia defect which had some omentum stuck in it.  We were able to reduce this and then separate the adhesions of omentum from the edge of the hernia sac  sharply with the harmonic scalpel.  Once this was accomplished the entire abdominal wall was free of any adhesions.  There was some very small Swiss cheeselike defects along the upper abdominal wall.  I then chose a 15 x 20 cm piece of ventral light mesh to cover the area.  Six #1 Novafil stitches were placed at equidistant points around the edge of the mesh with the mesh oriented with the coated side towards the bowel.  Next a small incision was made through the dominant hernia with a 15 blade knife.  The incision was carried through the skin and subcutaneous tissue sharply with the electrocautery until the hernia was entered.  The hernia sac was excised sharply with the electrocautery.  Through the fascial defect the mesh was inserted into the abdominal cavity with the appropriate orientation.  The fascial defect was then closed with multiple interrupted #1 Novafil stitches.  I then insufflated the abdominal cavity again.  I made 6 small stab incisions at points that corresponded to the placement of the stitches.  A suture passer was placed through each of these openings and used to bring the tails of each stitch through the abdominal wall.  Once this was accomplished then each stitch was cinched down and tied.  The mesh was observed to be in good apposition to the anterior abdominal wall without much redundancy.  The gaps between the stitches were filled in with a secure strap absorbable tacker.  Once this was accomplished and the mesh was in very good apposition to the abdominal wall without any folds or redundancy.  The area was examined and found to be hemostatic.  The rest of the abdominal cavity was examined and no other abnormalities were noted.  The gas was then allowed to escape under direct vision and the ports were then removed.  The subcutaneous tissue of the hernia incision was closed with a running 3-0 Vicryl stitch.  The rest of the incisions were then closed with interrupted 4-0 Monocryl  subcuticular stitches.  Dermabond dressings were applied along with a fluff of gauze and abdominal binder.  The patient tolerated the procedure well.  At the end of the case all needle sponge and instrument counts were correct.  The patient was then awakened and taken to recovery in stable condition.  PLAN OF CARE: Discharge to home after PACU  PATIENT DISPOSITION:  PACU - hemodynamically stable.   Delay start of Pharmacological VTE agent (>24hrs) due to surgical blood loss or risk of bleeding: not applicable

## 2020-02-10 NOTE — Addendum Note (Signed)
Addendum  created 02/10/20 1658 by Belinda Block, MD   Clinical Note Signed, Order list changed

## 2020-02-10 NOTE — Interval H&P Note (Signed)
History and Physical Interval Note:  02/10/2020 7:57 AM  [name]  has presented today for surgery, with the diagnosis of VENTRAL HERNIA.  The various methods of treatment have been discussed with the patient and family. After consideration of risks, benefits and other options for treatment, the patient has consented to  Procedure(s): [place] (N/A) as a surgical intervention.  The patient's history has been reviewed, patient examined, no change in status, stable for surgery.  I have reviewed the patient's chart and labs.  Questions were answered to the patient's satisfaction.     [name]

## 2020-02-10 NOTE — Anesthesia Postprocedure Evaluation (Signed)
Anesthesia Post Note  Patient: [name]  Procedure(s) Performed: LAPAROSCOPIC ASSISTED VENTRAL HERNIA REPAIR (N/A Abdomen) INSERTION OF MESH (N/A Abdomen)     Patient location during evaluation: PACU Anesthesia Type: General Level of consciousness: awake Pain management: pain level controlled Vital Signs Assessment: post-procedure vital signs reviewed and stable Respiratory status: spontaneous breathing Cardiovascular status: stable Postop Assessment: no apparent nausea or vomiting Anesthetic complications: no   No complications documented.  Last Vitals:  Vitals:   02/10/20 1135 02/10/20 1150  BP: (!) 170/84 (!) 141/95  Pulse: 94 96  Resp: (!) 23 18  Temp:    SpO2: 91% 95%    Last Pain:  Vitals:   02/10/20 1150  TempSrc:   PainSc: 0-No pain                 Jenifer Struve

## 2020-02-10 NOTE — Anesthesia Postprocedure Evaluation (Signed)
Anesthesia Post Note  Patient: [name]  Procedure(s) Performed: LAPAROSCOPIC ASSISTED VENTRAL HERNIA REPAIR (N/A Abdomen) INSERTION OF MESH (N/A Abdomen)     Anesthesia Post Evaluation No complications documented.  Last Vitals:  Vitals:   02/10/20 1220 02/10/20 1225  BP: (!) 153/77 (!) 153/77  Pulse: (!) 106 (!) 104  Resp: 18 (!) 21  Temp: (!) 36.2 C   SpO2: 94% 100%    Last Pain:  Vitals:   02/10/20 1220  TempSrc:   PainSc: 0-No pain                 Brianny Soulliere

## 2020-02-10 NOTE — Transfer of Care (Signed)
Immediate Anesthesia Transfer of Care Note  Patient: [name]  Procedure(s) Performed: LAPAROSCOPIC ASSISTED VENTRAL HERNIA REPAIR (N/A Abdomen) INSERTION OF MESH (N/A Abdomen)  Patient Location: PACU  Anesthesia Type:General  Level of Consciousness: drowsy and patient cooperative  Airway & Oxygen Therapy: Patient Spontanous Breathing and Patient connected to face mask oxygen  Post-op Assessment: Report given to RN, Post -op Vital signs reviewed and stable and Patient moving all extremities X 4  Post vital signs: Reviewed and stable  Last Vitals:  Vitals Value Taken Time  BP 208/170 02/10/20 1118  Temp    Pulse 91 02/10/20 1122  Resp 22 02/10/20 1122  SpO2 92 % 02/10/20 1122  Vitals shown include unvalidated device data.  Last Pain:  Vitals:   02/10/20 0719  TempSrc:   PainSc: 0-No pain      Patients Stated Pain Goal: 3 (81/84/03 7543)  Complications: No complications documented.

## 2020-02-10 NOTE — Anesthesia Procedure Notes (Signed)
Procedure Name: Intubation Date/Time: 02/10/2020 8:52 AM Performed by: Darletta Moll, CRNA Pre-anesthesia Checklist: Patient identified, Emergency Drugs available, Suction available and Patient being monitored Patient Re-evaluated:Patient Re-evaluated prior to induction Oxygen Delivery Method: Circle system utilized Preoxygenation: Pre-oxygenation with 100% oxygen Induction Type: IV induction Ventilation: Mask ventilation without difficulty Laryngoscope Size: Mac and 3 Grade View: Grade I Tube type: Oral Tube size: 7.0 mm Number of attempts: 1 Airway Equipment and Method: Stylet Placement Confirmation: ETT inserted through vocal cords under direct vision,  positive ETCO2 and breath sounds checked- equal and bilateral Secured at: 21 cm Tube secured with: Tape Dental Injury: Teeth and Oropharynx as per pre-operative assessment

## 2020-02-13 ENCOUNTER — Encounter (HOSPITAL_COMMUNITY): Payer: Self-pay | Admitting: General Surgery

## 2020-02-21 NOTE — Progress Notes (Signed)
NIPPLE AREOLAR TATTOO PROCEDURE  PREOPERATIVE DIAGNOSIS:  Acquired absence of (BILATERAL) nipple areolar   POSTOPERATIVE DIAGNOSIS: Acquired absence of (BILATERAL) nipple areolar    PROCEDURES: left nipple areolar tattoo/touch-up- correction of superior and medial aspect of areola complex.   ATTENDING SURGEON: Dr. Audelia Hives  ANESTHESIA:  EMLA-30 mins prior to procedure  COMPLICATIONS: None.  JUSTIFICATION FOR PROCEDURE:  Ms. Shackleford is a 69 y.o. female with a history of breast cancer status post  Bilateral breast reconstruction.  The patient presents for left nipple areolar complex tattoo touch-up.  Risks, benefits, indications, and alternatives of the above described procedures were discussed with the patient and all the patient's questions were answered.  Pt is in agreement to have the tattoo instructor to be present for this appointment. Present in the procedure room are the patient, tattoo instructor- Cassie White & myself. Pre- procedure photos were taken & entered into chart. Pt is here today for revision/touch-up of the left nipple areolar complex. On the 2nd bilateral nipple/ areola tattoo touch-up on 09/12/19- the right side nipple was touched up without any complication, however the left side NAC became red/swollen in the tissue surrounding the areola and on inspection/assessment at her f/u-11/28/19 the tattoo ink/shaded area was noted outside of the left NAC.  It appears as if the ink has migrated past the NAC area & is noted on the superior & medial aspect. Instructor Cassie White & myself consulted prior to this visit on behave of the pt & per recommendation of the instructor- I ordered supplies to assist with correcting this issue. DESCRIPTION OF PROCEDURE: After informed consent was obtained and proper identification of patient and surgical site was made, the patient was taken to the procedure room and placed supine on the operating room table  A time out was performed  to confirm patient's identity and surgical site. The patient was prepped and draped in the usual sterile fashion.  Using a hand held/tap tool pigment reducing/"Lightning" solution  was instilled to the superior and medial aspect of the left areola complex. Pt will f/u in 4-6 weeks for determination of next procedure as needed. She is reminded to keep area covered with vaseline/xeroform and gauze. She will call for any complications.

## 2020-02-25 ENCOUNTER — Ambulatory Visit: Payer: Medicare HMO

## 2020-03-01 ENCOUNTER — Telehealth: Payer: Self-pay

## 2020-03-01 NOTE — Telephone Encounter (Signed)
Patient said she needed to schedule a follow up with [facility] to check procedure site to determine whether a touch up procedure should be scheduled. Advised patient next available in January. Patient is okay to wait until then, if Doroteo Bradford is. Please call patient to advise if she should stop by for a quick check or if she should just schedule next appt for the new year.

## 2020-03-04 DIAGNOSIS — G4733 Obstructive sleep apnea (adult) (pediatric): Secondary | ICD-10-CM | POA: Diagnosis not present

## 2020-03-19 ENCOUNTER — Other Ambulatory Visit: Payer: Self-pay

## 2020-03-19 ENCOUNTER — Ambulatory Visit (INDEPENDENT_AMBULATORY_CARE_PROVIDER_SITE_OTHER): Payer: Medicare HMO

## 2020-03-19 VITALS — BP 138/88 | HR 87 | Temp 98.2°F | Wt 204.0 lb

## 2020-03-19 DIAGNOSIS — Z9889 Other specified postprocedural states: Secondary | ICD-10-CM

## 2020-03-26 NOTE — Progress Notes (Signed)
Pt in office for post-procedure photos Photos taken & entered into chart Left nipple/areola area has improved. There is less "ink scatter" at the top of the areola complex and the areola is more defined. She will need touch-up to further decrease the ink outside of the area. I will accomplish the above with "tap-in" tattoo approach as before with Cassie- my instructor present on 02/09/20 Right NAC is healing well with no complication. Pt agrees with plan of care

## 2020-03-26 NOTE — Patient Instructions (Signed)
Pt will keep area covered with vaseline/xeroform & gauze for approx. 2 weeks Will use sunscreen/moisturizer as neeed. F/U in 6 weeks  Call for any concerns

## 2020-03-28 NOTE — Telephone Encounter (Signed)
Pt is coming in for touch-up tattoo on 05/29/19

## 2020-04-03 DIAGNOSIS — G4733 Obstructive sleep apnea (adult) (pediatric): Secondary | ICD-10-CM | POA: Diagnosis not present

## 2020-04-23 DIAGNOSIS — Z20822 Contact with and (suspected) exposure to covid-19: Secondary | ICD-10-CM | POA: Diagnosis not present

## 2020-05-01 DIAGNOSIS — I10 Essential (primary) hypertension: Secondary | ICD-10-CM | POA: Diagnosis not present

## 2020-05-01 DIAGNOSIS — E890 Postprocedural hypothyroidism: Secondary | ICD-10-CM | POA: Diagnosis not present

## 2020-05-01 DIAGNOSIS — C73 Malignant neoplasm of thyroid gland: Secondary | ICD-10-CM | POA: Diagnosis not present

## 2020-05-04 DIAGNOSIS — G4733 Obstructive sleep apnea (adult) (pediatric): Secondary | ICD-10-CM | POA: Diagnosis not present

## 2020-05-08 DIAGNOSIS — I10 Essential (primary) hypertension: Secondary | ICD-10-CM | POA: Diagnosis not present

## 2020-05-08 DIAGNOSIS — E890 Postprocedural hypothyroidism: Secondary | ICD-10-CM | POA: Diagnosis not present

## 2020-05-08 DIAGNOSIS — E876 Hypokalemia: Secondary | ICD-10-CM | POA: Diagnosis not present

## 2020-05-08 DIAGNOSIS — C73 Malignant neoplasm of thyroid gland: Secondary | ICD-10-CM | POA: Diagnosis not present

## 2020-05-10 ENCOUNTER — Other Ambulatory Visit (HOSPITAL_COMMUNITY): Payer: Self-pay | Admitting: Endocrinology

## 2020-05-10 DIAGNOSIS — C73 Malignant neoplasm of thyroid gland: Secondary | ICD-10-CM

## 2020-05-18 DIAGNOSIS — E039 Hypothyroidism, unspecified: Secondary | ICD-10-CM | POA: Diagnosis not present

## 2020-05-18 DIAGNOSIS — I10 Essential (primary) hypertension: Secondary | ICD-10-CM | POA: Diagnosis not present

## 2020-05-18 DIAGNOSIS — E661 Drug-induced obesity: Secondary | ICD-10-CM | POA: Diagnosis not present

## 2020-05-18 DIAGNOSIS — E119 Type 2 diabetes mellitus without complications: Secondary | ICD-10-CM | POA: Diagnosis not present

## 2020-05-18 DIAGNOSIS — R7301 Impaired fasting glucose: Secondary | ICD-10-CM | POA: Diagnosis not present

## 2020-05-24 DIAGNOSIS — C73 Malignant neoplasm of thyroid gland: Secondary | ICD-10-CM | POA: Diagnosis not present

## 2020-05-28 ENCOUNTER — Encounter (HOSPITAL_COMMUNITY)
Admission: RE | Admit: 2020-05-28 | Discharge: 2020-05-28 | Disposition: A | Discharge status: Home / Self Care | Payer: Medicare HMO | Source: Ambulatory Visit | Attending: Endocrinology | Admitting: Endocrinology

## 2020-05-28 ENCOUNTER — Ambulatory Visit (INDEPENDENT_AMBULATORY_CARE_PROVIDER_SITE_OTHER): Payer: Medicare HMO

## 2020-05-28 ENCOUNTER — Other Ambulatory Visit: Payer: Self-pay

## 2020-05-28 VITALS — BP 140/88 | HR 68 | Temp 98.2°F | Wt 202.0 lb

## 2020-05-28 DIAGNOSIS — C73 Malignant neoplasm of thyroid gland: Secondary | ICD-10-CM | POA: Diagnosis not present

## 2020-05-28 DIAGNOSIS — Z9889 Other specified postprocedural states: Secondary | ICD-10-CM

## 2020-05-28 MED ORDER — STERILE WATER FOR INJECTION IJ SOLN
INTRAMUSCULAR | Status: AC
  Filled 2020-05-28: qty 10

## 2020-05-28 MED ORDER — THYROTROPIN ALFA 0.9 MG IM SOLR
0.9000 mg | INTRAMUSCULAR | Status: AC
  Administered 2020-05-28: 0.9 mg via INTRAMUSCULAR

## 2020-05-29 ENCOUNTER — Encounter (HOSPITAL_COMMUNITY)
Admission: RE | Admit: 2020-05-29 | Discharge: 2020-05-29 | Disposition: A | Discharge status: Home / Self Care | Payer: Medicare HMO | Source: Ambulatory Visit | Attending: Endocrinology | Admitting: Endocrinology

## 2020-05-29 DIAGNOSIS — C73 Malignant neoplasm of thyroid gland: Secondary | ICD-10-CM | POA: Diagnosis not present

## 2020-05-29 MED ORDER — STERILE WATER FOR INJECTION IJ SOLN
1.0000 mL | Freq: Once | INTRAMUSCULAR | Status: AC
  Administered 2020-05-29: 1 mL via INTRAMUSCULAR

## 2020-05-29 MED ORDER — THYROTROPIN ALFA 0.9 MG IM SOLR
0.9000 mg | INTRAMUSCULAR | Status: AC
  Administered 2020-05-29: 0.9 mg via INTRAMUSCULAR

## 2020-05-29 MED ORDER — STERILE WATER FOR INJECTION IJ SOLN
INTRAMUSCULAR | Status: AC
  Filled 2020-05-29: qty 10

## 2020-05-30 ENCOUNTER — Other Ambulatory Visit (HOSPITAL_COMMUNITY): Payer: Medicare HMO

## 2020-05-30 ENCOUNTER — Encounter (HOSPITAL_COMMUNITY)
Admission: RE | Admit: 2020-05-30 | Discharge: 2020-05-30 | Disposition: A | Discharge status: Home / Self Care | Payer: Medicare HMO | Source: Ambulatory Visit | Attending: Endocrinology | Admitting: Endocrinology

## 2020-05-30 DIAGNOSIS — C73 Malignant neoplasm of thyroid gland: Secondary | ICD-10-CM | POA: Diagnosis not present

## 2020-05-30 MED ORDER — SODIUM IODIDE I 131 CAPSULE
4.1000 | Freq: Once | INTRAVENOUS | Status: AC | PRN
  Administered 2020-05-30: 4.1 via ORAL

## 2020-06-01 ENCOUNTER — Encounter (HOSPITAL_COMMUNITY)
Admission: RE | Admit: 2020-06-01 | Discharge: 2020-06-01 | Disposition: A | Discharge status: Home / Self Care | Payer: Medicare HMO | Source: Ambulatory Visit | Attending: Endocrinology | Admitting: Endocrinology

## 2020-06-01 ENCOUNTER — Other Ambulatory Visit: Payer: Self-pay

## 2020-06-01 DIAGNOSIS — C73 Malignant neoplasm of thyroid gland: Secondary | ICD-10-CM | POA: Diagnosis not present

## 2020-06-04 DIAGNOSIS — G4733 Obstructive sleep apnea (adult) (pediatric): Secondary | ICD-10-CM | POA: Diagnosis not present

## 2020-06-05 DIAGNOSIS — R1031 Right lower quadrant pain: Secondary | ICD-10-CM | POA: Diagnosis not present

## 2020-06-13 ENCOUNTER — Other Ambulatory Visit: Payer: Self-pay | Admitting: Endocrinology

## 2020-06-13 DIAGNOSIS — N390 Urinary tract infection, site not specified: Secondary | ICD-10-CM | POA: Diagnosis not present

## 2020-06-13 DIAGNOSIS — I10 Essential (primary) hypertension: Secondary | ICD-10-CM | POA: Diagnosis not present

## 2020-06-13 DIAGNOSIS — G4730 Sleep apnea, unspecified: Secondary | ICD-10-CM | POA: Diagnosis not present

## 2020-06-13 DIAGNOSIS — E1169 Type 2 diabetes mellitus with other specified complication: Secondary | ICD-10-CM | POA: Diagnosis not present

## 2020-06-13 DIAGNOSIS — C73 Malignant neoplasm of thyroid gland: Secondary | ICD-10-CM | POA: Diagnosis not present

## 2020-06-13 DIAGNOSIS — C50119 Malignant neoplasm of central portion of unspecified female breast: Secondary | ICD-10-CM | POA: Diagnosis not present

## 2020-06-14 NOTE — Progress Notes (Signed)
NIPPLE AREOLAR TATTOO PROCEDURE  PREOPERATIVE DIAGNOSIS:  Acquired absence of (BILATERAL) nipple areolar   POSTOPERATIVE DIAGNOSIS: Acquired absence of (BILATERAL) nipple areolar    PROCEDURES: (BILATERAL) nipple areolar tattoo   ATTENDING SURGEON: Dr. Lyndee Leo Sanger   ANESTHESIA:  EMLA applied by pt 30 mins prior to procedure  COMPLICATIONS: None.  JUSTIFICATION FOR PROCEDURE:  [name] is a 70 y.o. female with a history of breast cancer status post bilateral breast reconstruction. The patient presents for nipple areolar complex tattoo. Risks, benefits, indications, and alternatives of the above described procedures were discussed with the patient and all the patient's questions were answered.   DESCRIPTION OF PROCEDURE: After informed consent was obtained and proper identification of patient and surgical site was made, the patient was taken to the procedure room and pre-procedure photos taken & entered into chart. Pt was then placed supine on the operating room table. A time out was performed to confirm patient's identity and surgical site. The patient was prepped and draped in the usual sterile fashion.   Using a # 7  tattoo needle, pigment was instilled to the designed nipple areolar complex.  Once adequate pigment had been applied to the nipple areolar complex, a post-procedure photo was taken & vaseline/ xeroform & gauze  dressing was applied.  The patient tolerated the procedure well.   World [employer]amous Tattoo Ink used: Fair [employer] Skin/ [place]/ Antique Gold/ Cool Honey Duration used as needed for anesthesia Lot #'s & exp dates on file. Reviewed post procedure care with pt

## 2020-06-14 NOTE — Patient Instructions (Signed)
Post-procedure care- vaseline/xeroform & gauze dressing for approx 10 days She understands that the colors/shades may fade-which may require touch-up later. She will call for any concerns/questions

## 2020-06-26 ENCOUNTER — Encounter (HOSPITAL_COMMUNITY): Admission: RE | Admit: 2020-06-26 | Payer: Medicare HMO | Source: Ambulatory Visit

## 2020-06-26 ENCOUNTER — Encounter (HOSPITAL_COMMUNITY): Payer: Self-pay

## 2020-07-02 DIAGNOSIS — G4733 Obstructive sleep apnea (adult) (pediatric): Secondary | ICD-10-CM | POA: Diagnosis not present

## 2020-07-03 ENCOUNTER — Ambulatory Visit (HOSPITAL_COMMUNITY)
Admission: RE | Admit: 2020-07-03 | Discharge: 2020-07-03 | Disposition: A | Discharge status: Home / Self Care | Payer: Medicare HMO | Source: Ambulatory Visit | Attending: Endocrinology | Admitting: Endocrinology

## 2020-07-03 ENCOUNTER — Other Ambulatory Visit: Payer: Self-pay

## 2020-07-03 DIAGNOSIS — C73 Malignant neoplasm of thyroid gland: Secondary | ICD-10-CM | POA: Diagnosis not present

## 2020-07-03 LAB — GLUCOSE, CAPILLARY: Glucose-Capillary: 107 mg/dL — ABNORMAL HIGH (ref 70–99)

## 2020-07-03 MED ORDER — FLUDEOXYGLUCOSE F - 18 (FDG) INJECTION
10.0000 | Freq: Once | INTRAVENOUS | Status: AC | PRN
  Administered 2020-07-03: 10.08 via INTRAVENOUS

## 2020-07-09 ENCOUNTER — Ambulatory Visit (HOSPITAL_COMMUNITY): Payer: Medicare HMO

## 2020-08-02 DIAGNOSIS — G4733 Obstructive sleep apnea (adult) (pediatric): Secondary | ICD-10-CM | POA: Diagnosis not present

## 2020-08-06 ENCOUNTER — Ambulatory Visit: Payer: Medicare HMO

## 2020-08-09 ENCOUNTER — Ambulatory Visit: Payer: Medicare HMO

## 2020-08-22 DIAGNOSIS — C50912 Malignant neoplasm of unspecified site of left female breast: Secondary | ICD-10-CM | POA: Diagnosis not present

## 2020-08-22 DIAGNOSIS — Z9011 Acquired absence of right breast and nipple: Secondary | ICD-10-CM | POA: Diagnosis not present

## 2020-09-01 DIAGNOSIS — G4733 Obstructive sleep apnea (adult) (pediatric): Secondary | ICD-10-CM | POA: Diagnosis not present

## 2020-09-07 DIAGNOSIS — E890 Postprocedural hypothyroidism: Secondary | ICD-10-CM | POA: Diagnosis not present

## 2020-09-10 DIAGNOSIS — E890 Postprocedural hypothyroidism: Secondary | ICD-10-CM | POA: Diagnosis not present

## 2020-09-10 DIAGNOSIS — C73 Malignant neoplasm of thyroid gland: Secondary | ICD-10-CM | POA: Diagnosis not present

## 2020-09-13 DIAGNOSIS — C73 Malignant neoplasm of thyroid gland: Secondary | ICD-10-CM | POA: Diagnosis not present

## 2020-09-13 DIAGNOSIS — E890 Postprocedural hypothyroidism: Secondary | ICD-10-CM | POA: Diagnosis not present

## 2020-09-14 DIAGNOSIS — G4733 Obstructive sleep apnea (adult) (pediatric): Secondary | ICD-10-CM | POA: Diagnosis not present

## 2020-09-14 DIAGNOSIS — E039 Hypothyroidism, unspecified: Secondary | ICD-10-CM | POA: Diagnosis not present

## 2020-09-14 DIAGNOSIS — E1169 Type 2 diabetes mellitus with other specified complication: Secondary | ICD-10-CM | POA: Diagnosis not present

## 2020-09-14 DIAGNOSIS — D0580 Other specified type of carcinoma in situ of unspecified breast: Secondary | ICD-10-CM | POA: Diagnosis not present

## 2020-09-14 DIAGNOSIS — I10 Essential (primary) hypertension: Secondary | ICD-10-CM | POA: Diagnosis not present

## 2020-09-14 DIAGNOSIS — N390 Urinary tract infection, site not specified: Secondary | ICD-10-CM | POA: Diagnosis not present

## 2020-09-14 DIAGNOSIS — E661 Drug-induced obesity: Secondary | ICD-10-CM | POA: Diagnosis not present

## 2020-09-14 DIAGNOSIS — R7309 Other abnormal glucose: Secondary | ICD-10-CM | POA: Diagnosis not present

## 2020-09-25 DIAGNOSIS — G4733 Obstructive sleep apnea (adult) (pediatric): Secondary | ICD-10-CM | POA: Diagnosis not present

## 2020-09-25 DIAGNOSIS — E039 Hypothyroidism, unspecified: Secondary | ICD-10-CM | POA: Diagnosis not present

## 2020-09-25 DIAGNOSIS — I10 Essential (primary) hypertension: Secondary | ICD-10-CM | POA: Diagnosis not present

## 2020-09-25 DIAGNOSIS — E1169 Type 2 diabetes mellitus with other specified complication: Secondary | ICD-10-CM | POA: Diagnosis not present

## 2020-10-02 DIAGNOSIS — C73 Malignant neoplasm of thyroid gland: Secondary | ICD-10-CM | POA: Diagnosis not present

## 2020-10-02 DIAGNOSIS — G4733 Obstructive sleep apnea (adult) (pediatric): Secondary | ICD-10-CM | POA: Diagnosis not present

## 2020-10-02 DIAGNOSIS — E890 Postprocedural hypothyroidism: Secondary | ICD-10-CM | POA: Diagnosis not present

## 2020-10-02 DIAGNOSIS — I10 Essential (primary) hypertension: Secondary | ICD-10-CM | POA: Diagnosis not present

## 2020-10-02 DIAGNOSIS — E876 Hypokalemia: Secondary | ICD-10-CM | POA: Diagnosis not present

## 2020-10-04 ENCOUNTER — Other Ambulatory Visit: Payer: Self-pay

## 2020-10-04 ENCOUNTER — Ambulatory Visit (INDEPENDENT_AMBULATORY_CARE_PROVIDER_SITE_OTHER): Payer: Medicare HMO

## 2020-10-04 VITALS — BP 136/82 | HR 84 | Temp 97.9°F | Wt 198.0 lb

## 2020-10-04 DIAGNOSIS — Z9889 Other specified postprocedural states: Secondary | ICD-10-CM | POA: Diagnosis not present

## 2020-10-04 DIAGNOSIS — Z0000 Encounter for general adult medical examination without abnormal findings: Secondary | ICD-10-CM | POA: Diagnosis not present

## 2020-10-08 DIAGNOSIS — Z471 Aftercare following joint replacement surgery: Secondary | ICD-10-CM | POA: Diagnosis not present

## 2020-10-08 DIAGNOSIS — Z96651 Presence of right artificial knee joint: Secondary | ICD-10-CM | POA: Diagnosis not present

## 2020-10-08 DIAGNOSIS — M25561 Pain in right knee: Secondary | ICD-10-CM | POA: Diagnosis not present

## 2020-10-16 ENCOUNTER — Ambulatory Visit: Payer: Medicare HMO | Admitting: Physical Therapy

## 2020-10-21 NOTE — Patient Instructions (Signed)
Patient will call if symptoms do not resolve or worsen. She has f/u in Aug with Dr. Marla Roe

## 2020-10-21 NOTE — Progress Notes (Signed)
10/04/20:  patient is here today for evaluation of bilateral NAC tattoos. Patient voiced that she doesn't want to have the tattoo touch-up today- because she has another medical appointment today but she would like for me to take photos & forward them to Dr. Marla Roe for review. She c/o changes to the scar on her right/medial breast incision which is causing her increased tenderness and "puling".she denies any drainage or other complication.  I entered photos into her chart & she will f/u with Dr. Marla Roe on 11/27/20 for further plan of care. We will evaluate her NAC tattoos at a later time & determine if touch-up is necessary. She will call if there any changes or concerns.

## 2020-10-25 ENCOUNTER — Ambulatory Visit: Payer: Medicare HMO | Attending: Family Medicine | Admitting: Physical Therapy

## 2020-10-25 ENCOUNTER — Encounter: Payer: Self-pay | Admitting: Physical Therapy

## 2020-10-25 ENCOUNTER — Other Ambulatory Visit: Payer: Self-pay

## 2020-10-25 DIAGNOSIS — R252 Cramp and spasm: Secondary | ICD-10-CM

## 2020-10-25 DIAGNOSIS — M25561 Pain in right knee: Secondary | ICD-10-CM | POA: Insufficient documentation

## 2020-10-25 DIAGNOSIS — M6281 Muscle weakness (generalized): Secondary | ICD-10-CM | POA: Diagnosis not present

## 2020-10-25 NOTE — Patient Instructions (Signed)
Access Code: [email or web address] URL: [email or web address] Date: 10/25/2020 Prepared by: Everardo All  Exercises Quadricep Stretch with Chair and Counter Support - 2 x daily - 7 x weekly - 1 sets - 2 reps - 20s hold Gastroc Stretch with Foot at [place] - 2 x daily - 7 x weekly - 1 sets - 2 reps - 20s hold Seated Hip Flexor Stretch - 3 x daily - 7 x weekly - 1 sets - 2 reps - 20s hold Seated Long Arc Quad - 1 x daily - 7 x weekly - 2 sets - 10 reps Seated Hip Adduction Squeeze with Ball - 1 x daily - 7 x weekly - 1 sets - 12 reps - 5s hold Hip Abduction with Resistance Loop - 1 x daily - 7 x weekly - 2 sets - 10 reps Hip Extension with Resistance Loop - 1 x daily - 7 x weekly - 2 sets - 10 reps

## 2020-10-25 NOTE — Therapy (Addendum)
[facility] [address] [place], [place], [address] Phone: [phone]   Fax:  [phone]  Physical Therapy Evaluation  Patient Details  Name: [name] MRN: [id number] Date of Birth: 07-16-1950 Referring Provider (PT): Rhina Brackett, MD   Encounter Date: 10/25/2020   PT End of Session - 10/25/20 1012     Visit Number 1    Date for PT Re-Evaluation 12/06/20    Authorization Type [employer] MCR    Authorization Time Period Requesting 12 visits 10/25/2020 - 12/06/2020    Progress Note Due on Visit 10    PT Start Time 0803    PT Stop Time 0851    PT Time Calculation (min) 48 min    Activity Tolerance Patient tolerated treatment well;No increased pain    Behavior During Therapy WFL for tasks assessed/performed             Past Medical History:  Diagnosis Date   Anemia    "years ago"   Arthritis    "left hand; was in my knees" (09/24/2017)   Breast cancer, right breast ([place]) 2004   Bruises easily    Chronic lower back pain    Ductal carcinoma in situ (DCIS) of left breast 2018   Genetic testing 04/17/2017   STAT Breast panel with reflex to Multi-Cancer panel (83 genes) @ [facility] - No pathogenic mutations detected   History of bronchitis    when she was young   Hypertension    no longer taking medications as of August 2019   Hypothyroidism    takes Synthroid daily   Joint pain    Joint swelling    Personal history of chemotherapy 04/2003   Personal history of radiation therapy 2005   Pneumonia    hx of when she was young   Right elbow tendonitis    Sleep apnea    has a machine but does not use (09/24/2017)   Thyroid cancer ([place]) 2012   Urinary frequency     Past Surgical History:  Procedure Laterality Date   ANTERIOR CERVICAL DECOMP/DISCECTOMY FUSION  2008   APPENDECTOMY  2007   BREAST BIOPSY Right 02/28/2003   malignant   BREAST BIOPSY Left 03/2017   BREAST LUMPECTOMY Right 2004    BREAST RECONSTRUCTION WITH PLACEMENT OF TISSUE EXPANDER AND FLEX HD (ACELLULAR HYDRATED DERMIS) Bilateral 06/22/2017   Procedure: IMMEDIATE BILATERAL BREAST RECONSTRUCTION WITH PLACEMENT OF TISSUE EXPANDER AND FLEX HD (ACELLULAR HYDRATED DERMIS);  Surgeon: Wallace Going, DO;  Location: [place];  Service: Plastics;  Laterality: Bilateral;   CESAREAN SECTION  1981   COLONOSCOPY  X 2   INSERTION OF MESH N/A 02/10/2020   Procedure: INSERTION OF MESH;  Surgeon: Jovita Kussmaul, MD;  Location: [place];  Service: General;  Laterality: N/A;   KNEE ARTHROSCOPY Right 2007   KNEE ARTHROSCOPY  05/02/2011   Procedure: ARTHROSCOPY KNEE;  Surgeon: Johnn Hai;  Location: [place];  Service: Orthopedics;  Laterality: Right;   LATISSIMUS FLAP TO BREAST Right 09/23/2017   Procedure: RIGHT BREAST LATISSIMUS DORSI FLAP RECONSTRUCTION WITH TISSUE EXPANDER;  Surgeon: Wallace Going, DO;  Location: [place];  Service: Plastics;  Laterality: Right;   LIPOSUCTION WITH LIPOFILLING Bilateral 02/24/2019   Procedure: Bilateral lipofilling of breasts and liposuction of lateral breasts for symmetry;  Surgeon: Wallace Going, DO;  Location: [place];  Service: Plastics;  Laterality: Bilateral;  2 hours, please   MASTECTOMY  W/ SENTINEL NODE BIOPSY Bilateral 06/22/2017   Procedure: LEFT MASTECTOMY WITH SENTINEL LYMPH NODE BIOPSY AND RIGHT PROPHYLACTIC MASTECTOMY;  Surgeon: Jovita Kussmaul, MD;  Location: [place];  Service: General;  Laterality: Bilateral;   MENISCUS DEBRIDEMENT  05/02/2011   Procedure: DEBRIDEMENT OF MENISCUS;  Surgeon: Johnn Hai;  Location: [place];  Service: Orthopedics;  Laterality: Right;   PORTA CATH REMOVAL  2005   PORTACATH PLACEMENT  2004   REMOVAL OF BILATERAL TISSUE EXPANDERS WITH PLACEMENT OF BILATERAL BREAST IMPLANTS Bilateral 05/26/2018   Procedure: REMOVAL OF BILATERAL TISSUE EXPANDERS WITH  PLACEMENT OF BILATERAL BREAST IMPLANTS;  Surgeon: Wallace Going, DO;  Location: [place];  Service: Plastics;  Laterality: Bilateral;   REMOVAL OF TISSUE EXPANDER AND PLACEMENT OF IMPLANT Right 12/12/2017   Procedure: REMOVAL OF TISSUE EXPANDER, RIGHT;  Surgeon: Irene Limbo, MD;  Location: [facility];  Service: Plastics;  Laterality: Right;   THYROIDECTOMY N/A 09/22/2012   Procedure: RIGHT THYROID LOBECTOMY WITH FROZEN SECTION;  Surgeon: Izora Gala, MD;  Location: [place];  Service: ENT;  Laterality: N/A;   THYROIDECTOMY Left 10/21/2012   Procedure: COMPLETION OF THYROIDECTOMY;  Surgeon: Izora Gala, MD;  Location: [place];  Service: ENT;  Laterality: Left;   TISSUE EXPANDER PLACEMENT Right 03/03/2018   Procedure: TISSUE EXPANDER PLACEMENT;  Surgeon: Wallace Going, DO;  Location: [place];  Service: Plastics;  Laterality: Right;   TOTAL KNEE ARTHROPLASTY  04/14/2012   Procedure: TOTAL KNEE ARTHROPLASTY;  Surgeon: Ninetta Lights, MD;  Location: [place];  Service: Orthopedics;  Laterality: Right;  RIGHT ARTHROPLASTY KNEE MEDIAL/LATERAL COMPARTMENTS WITH PATELLA RESURFACING   TOTAL KNEE ARTHROPLASTY Left 03/30/2013   Procedure: TOTAL KNEE ARTHROPLASTY;  Surgeon: Ninetta Lights, MD;  Location: [place];  Service: Orthopedics;  Laterality: Left;   VENTRAL HERNIA REPAIR N/A 02/10/2020   Procedure: LAPAROSCOPIC ASSISTED VENTRAL HERNIA REPAIR;  Surgeon: Jovita Kussmaul, MD;  Location: [place];  Service: General;  Laterality: N/A;    There were no vitals filed for this visit.    Subjective Assessment - 10/25/20 0810     Subjective Patient presenting due to two month history of Rt kne epain. Patient reports that she first felt pain while walking while in [place] and felt sudden sharp pain in Rt knee. States that second occurence was when she returned home. Patient states that she was walking through the house and felt a "pull" at medial knee. Most recent occurence of  this pain was two nights ago when walking to the kitchen.    Pertinent History history of breast cancer, history of thyroid cancer    Currently in Pain? Other (Comment)   When pain occurs 10/10   Pain Location Knee    Pain Orientation Right;Medial    Pain Descriptors / Indicators Sharp;Shooting    Pain Type Acute pain    Pain Onset More than a month ago    Pain Frequency Intermittent                [facility] PT Assessment - 10/25/20 0001       Assessment   Medical Diagnosis Rt knee pain with bursitis    Referring Provider (PT) Rhina Brackett, MD    Onset Date/Surgical Date --   07/2020   Hand Dominance Right    Next MD Visit 10/2020    Prior Therapy Yes      Precautions   Precautions Other (comment)  Precaution Comments History of breast cancer; history of thyroid cancer      Restrictions   Weight Bearing Restrictions No      Balance Screen   Has the patient fallen in the past 6 months No      [place] residence    Living Arrangements Alone      Prior Function   Level of Independence Independent    Vocation Full time employment    Vocation Requirements Prolonged sitting, computer work      Cognition   Overall Cognitive Status Within Functional Limits for tasks assessed      Observation/Other Assessments   Focus on Therapeutic Outcomes (FOTO)  38 (goal 59)      Posture/Postural Control   Posture/Postural Control Postural limitations    Postural Limitations Rounded Shoulders    Posture Comments mild Rt knee valgus in standing      ROM / Strength   AROM / PROM / Strength AROM;Strength      AROM   AROM Assessment Site Knee    Right/Left Knee Right    Right Knee Extension 0    Right Knee Flexion 110      Strength   Strength Assessment Site Hip;Knee;Ankle    Right/Left Hip Right;Left    Right Hip Flexion 4/5    Right Hip Extension 4/5    Right Hip External Rotation  5/5    Right Hip Internal Rotation 5/5    Right  Hip ABduction 4/5    Right Hip ADduction 5/5    Left Hip Flexion 4/5    Left Hip Extension 4/5    Left Hip External Rotation 5/5    Left Hip Internal Rotation 5/5    Left Hip ABduction 5/5    Left Hip ADduction 5/5    Right/Left Knee Right;Left    Right Knee Flexion 5/5    Right Knee Extension 5/5    Left Knee Flexion 5/5    Left Knee Extension 5/5    Right Ankle Dorsiflexion 5/5    Right Ankle Plantar Flexion 5/5    Left Ankle Dorsiflexion 5/5    Left Ankle Plantar Flexion 5/5      Flexibility   Soft Tissue Assessment /Muscle Length yes    Hamstrings WNL    Quadriceps Increased tightness Rt quadricep      Special Tests    Special Tests Hip Special Tests;Sacrolliac Tests    Hip Special Tests  Saralyn Pilar (FABER) Test;Ely's Test;Hip Scouring      Saralyn Pilar [facility]) Test   Findings Negative    Comments Rt/Lt      Ely's Test   Findings Positive    Side Right      Hip Scouring   Findings Negative    Comments Lt/Rt      Transfers   Five time sit to stand comments  10.84 seconds   Poor eccentric control                       Objective measurements completed on examination: See above findings.               PT Education - 10/25/20 0923     Education Details Access Code: [email or web address]    Person(s) Educated Patient    Methods Explanation;Demonstration;Tactile cues;Verbal cues;Handout    Comprehension Verbalized understanding;Returned demonstration;Verbal cues required;Tactile cues required              PT Short Term Goals -  10/25/20 1008       PT SHORT TERM GOAL #1   Title Patient will be independent with HEP for continued progression at home.    Time 3    Period Weeks    Status New    Target Date 11/15/20      PT SHORT TERM GOAL #2   Title Patient will perform five time sit to stand with good eccentric control in 13s or less to indicate improved functional transfers.    Time 3    Period Weeks    Status New    Target Date 11/15/20                PT Long Term Goals - 10/25/20 1009       PT LONG TERM GOAL #1   Title Patient will be independent with advanced HEP for long term management of symptoms post D/C.    Time 6    Period Weeks    Status New    Target Date 12/06/20      PT LONG TERM GOAL #2   Title Patient will demonstrate 4+/5 bilateral hip extension and Rt hip abduction to indicate improved knee stability for decreased pain.    Time 6    Period Weeks    Status New    Target Date 12/06/20      PT LONG TERM GOAL #3   Title Patient will improve FOTO score to >/= 59 to indicate improved overall function.    Baseline 38    Time 6    Period Weeks    Status New    Target Date 12/06/20                    Plan - 10/25/20 1191     Clinical Impression Statement Patient is a 70 y/o female referred due to Rt knee pain. PMH includes history of breast cancer, history of thyroid cancer, and history of bilateral TKA. Patient reported activity limitations include apprehension when walking due to intermittent pain and fear of falling. She demonstrates Rt knee AROM impairments as patient is unable to flex knee past 110 degrees. Patient also exhibits LE strength impairments as Rt hip abduction and bilateral hip extension 4/5. Patient demos increased valgus at Rt knee in static standing. Ely's test positive indicating increased quad tightness. Functional mobility impairments apparent as patient demonstrates impaired eccentric control when performing 5x sit to stand. Patient would benefit from continued skilled intervention to address impairments for decreased pain, improved functional mobility, and decreased fear of falling.    Personal Factors and Comorbidities Comorbidity 3+    Comorbidities breast cancer, thyroid cancer, bilateral TKA    Examination-Activity Limitations Locomotion Level    Examination-Participation Restrictions Community Activity    Stability/Clinical Decision Making Stable/Uncomplicated     Clinical Decision Making Low    Rehab Potential Excellent    PT Frequency 2x / week    PT Duration 6 weeks    PT Treatment/Interventions ADLs/Self Care Home Management;Aquatic Therapy;Cryotherapy;Electrical Stimulation;Iontophoresis 4mg /ml Dexamethasone;Moist Heat;Gait training;Stair training;Functional mobility training;Therapeutic activities;Therapeutic exercise;Balance training;Neuromuscular re-education;Patient/family education;Manual techniques;Dry needling;Passive range of motion;Taping;Joint Manipulations    PT Next Visit Plan review HEP; begin VMO training and knee stabilizer strengthening; continue global hip/gastroc mobility    PT Home Exercise Plan Education Details Access Code: [email or web address]    Consulted and Agree with Plan of Care Patient             Patient will benefit from skilled therapeutic intervention in order  to improve the following deficits and impairments:  Abnormal gait, Decreased activity tolerance, Decreased balance, Decreased range of motion, Decreased strength, Difficulty walking, Increased muscle spasms, Postural dysfunction, Pain  Visit Diagnosis: Acute pain of right knee - Plan: PT plan of care cert/re-cert  Cramp and spasm - Plan: PT plan of care cert/re-cert  Muscle weakness (generalized) - Plan: PT plan of care cert/re-cert     Problem List Patient Active Problem List   Diagnosis Date Noted   Torn earlobe, left, initial encounter 12/17/2018   S/P breast reconstruction, bilateral 03/16/2018   History of breast cancer in female 01/27/2018   History of reconstruction of both breasts 01/27/2018   Acquired absence of right breast 09/23/2017   Breast cancer ([place]) 06/22/2017   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   Genetic testing 04/17/2017   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 [place], [place], [address] Phone: [phone]   Fax:  [phone]  Name: RONISHA HERRINGSHAW MRN: [id number] Date of Birth: 1950/12/08    PHYSICAL THERAPY DISCHARGE SUMMARY  Visits from Start of Care: (1) 10/25/20 evaluation date, only visit pt attended.   Current functional level related to goals / functional outcomes: Pt has not been present for scheduled treatments since initial evaluation. Goals unable to reassessed.    Remaining deficits: Unable to formally be assessed as pt has not returned to clinic.    Education / Equipment: HEP   Patient agrees to discharge. Patient goals were not met. Patient is being discharged due to not returning since the last visit.    Thank you for the referral.  Stacy Gardner, PT 11/23/2208:26 AM

## 2020-11-01 DIAGNOSIS — G4733 Obstructive sleep apnea (adult) (pediatric): Secondary | ICD-10-CM | POA: Diagnosis not present

## 2020-11-06 ENCOUNTER — Ambulatory Visit: Payer: Medicare HMO | Admitting: Physical Therapy

## 2020-11-07 ENCOUNTER — Encounter: Payer: Medicare HMO | Admitting: Physical Therapy

## 2020-11-08 ENCOUNTER — Encounter: Payer: Medicare HMO | Admitting: Physical Therapy

## 2020-11-13 ENCOUNTER — Encounter: Payer: Medicare HMO | Admitting: Physical Therapy

## 2020-11-15 ENCOUNTER — Telehealth: Payer: Self-pay | Admitting: Physical Therapy

## 2020-11-15 ENCOUNTER — Ambulatory Visit: Payer: Medicare HMO | Attending: Family Medicine | Admitting: Physical Therapy

## 2020-11-15 NOTE — Telephone Encounter (Signed)
Called pt about missed 8:15am appointment 11/15/20 and left message.   Stacy Gardner, PT 11/15/2209:50 AM

## 2020-11-19 ENCOUNTER — Ambulatory Visit: Payer: Medicare HMO | Admitting: Physical Therapy

## 2020-11-23 ENCOUNTER — Ambulatory Visit: Payer: Medicare HMO | Admitting: Physical Therapy

## 2020-11-23 ENCOUNTER — Telehealth: Payer: Self-pay | Admitting: Physical Therapy

## 2020-11-23 NOTE — Telephone Encounter (Signed)
Attempted to contact pt for second time via phone call and left message for pt. Pt informed of attendance policy and due to pt missing multiple appointments in a row she would be discharged for caseload. Pt informed if she continued to have needs please bring in a new referral and we can re-evaluate if needed.   Thank you, Stacy Gardner, PT, DPT

## 2020-11-25 DIAGNOSIS — E039 Hypothyroidism, unspecified: Secondary | ICD-10-CM | POA: Diagnosis not present

## 2020-11-25 DIAGNOSIS — E1169 Type 2 diabetes mellitus with other specified complication: Secondary | ICD-10-CM | POA: Diagnosis not present

## 2020-11-25 DIAGNOSIS — I10 Essential (primary) hypertension: Secondary | ICD-10-CM | POA: Diagnosis not present

## 2020-11-26 ENCOUNTER — Ambulatory Visit: Payer: Medicare HMO | Admitting: Physical Therapy

## 2020-11-27 ENCOUNTER — Encounter: Payer: Self-pay | Admitting: Plastic Surgery

## 2020-11-27 ENCOUNTER — Ambulatory Visit (INDEPENDENT_AMBULATORY_CARE_PROVIDER_SITE_OTHER): Payer: Medicare HMO | Admitting: Plastic Surgery

## 2020-11-27 ENCOUNTER — Other Ambulatory Visit: Payer: Self-pay

## 2020-11-27 DIAGNOSIS — Z9889 Other specified postprocedural states: Secondary | ICD-10-CM

## 2020-11-27 NOTE — Progress Notes (Signed)
   Subjective:    Patient ID: [name], female    DOB: 11-Nov-1950, 70 y.o.   MRN: [id number]  The patient is a 70 year old female here for follow-up on her breast reconstruction.  She had bilateral mastectomies.  She has an expander on the left and a latissimus and expander on the right.  She had some complications along the way with radiation.  We did some fat filling 2 years ago.  Overall she is doing extremely well.  She has great symmetry in her bra and in close.  She has had nipple areolar tattooing and it is an excellent job.  No lumps or bumps noted.  She has some scarring at the inframammary fold of the right breast on the medial aspect.  This is much better than it had been previously.  She does not really want to do anything with that right now and I think that is a great idea.     Review of Systems  Constitutional: Negative.   HENT: Negative.    Eyes: Negative.   Respiratory: Negative.    Cardiovascular: Negative.   Gastrointestinal: Negative.   Endocrine: Negative.   Genitourinary: Negative.   Hematological: Negative.   Psychiatric/Behavioral: Negative.        Objective:   Physical Exam Constitutional:      Appearance: Normal appearance.  Cardiovascular:     Rate and Rhythm: Normal rate.     Pulses: Normal pulses.  Pulmonary:     Effort: Pulmonary effort is normal.  Abdominal:     General: Abdomen is flat. There is no distension.     Tenderness: There is no abdominal tenderness.  Musculoskeletal:        General: No swelling or deformity.  Skin:    General: Skin is warm.     Capillary Refill: Capillary refill takes less than 2 seconds.     Coloration: Skin is not jaundiced.     Findings: No bruising.  Neurological:     Mental Status: She is alert. Mental status is at baseline.       Assessment & Plan:     ICD-10-CM   1. S/P breast reconstruction, bilateral  Z98.890     Plan on a 1 year follow-up.  No changes for now.  Lipo filling is certainly an  option if needed and again in the future.  Patient is happy with that decision.  Pictures were obtained of the patient and placed in the chart with the patient's or guardian's permission.

## 2020-11-28 ENCOUNTER — Ambulatory Visit: Payer: Medicare HMO

## 2020-12-02 DIAGNOSIS — G4733 Obstructive sleep apnea (adult) (pediatric): Secondary | ICD-10-CM | POA: Diagnosis not present

## 2020-12-03 ENCOUNTER — Encounter: Payer: Medicare HMO | Admitting: Physical Therapy

## 2020-12-05 ENCOUNTER — Ambulatory Visit: Payer: Medicare HMO

## 2020-12-05 ENCOUNTER — Encounter: Payer: Medicare HMO | Admitting: Physical Therapy

## 2020-12-26 DIAGNOSIS — E1169 Type 2 diabetes mellitus with other specified complication: Secondary | ICD-10-CM | POA: Diagnosis not present

## 2020-12-26 DIAGNOSIS — I10 Essential (primary) hypertension: Secondary | ICD-10-CM | POA: Diagnosis not present

## 2020-12-26 DIAGNOSIS — E039 Hypothyroidism, unspecified: Secondary | ICD-10-CM | POA: Diagnosis not present

## 2021-01-02 DIAGNOSIS — G4733 Obstructive sleep apnea (adult) (pediatric): Secondary | ICD-10-CM | POA: Diagnosis not present

## 2021-01-16 ENCOUNTER — Ambulatory Visit: Payer: Medicare HMO

## 2021-01-28 ENCOUNTER — Ambulatory Visit: Payer: Medicare HMO

## 2021-02-01 DIAGNOSIS — G4733 Obstructive sleep apnea (adult) (pediatric): Secondary | ICD-10-CM | POA: Diagnosis not present

## 2021-02-14 DIAGNOSIS — I10 Essential (primary) hypertension: Secondary | ICD-10-CM | POA: Diagnosis not present

## 2021-02-14 DIAGNOSIS — R635 Abnormal weight gain: Secondary | ICD-10-CM | POA: Diagnosis not present

## 2021-02-14 DIAGNOSIS — I43 Cardiomyopathy in diseases classified elsewhere: Secondary | ICD-10-CM | POA: Diagnosis not present

## 2021-02-14 DIAGNOSIS — E1169 Type 2 diabetes mellitus with other specified complication: Secondary | ICD-10-CM | POA: Diagnosis not present

## 2021-02-14 DIAGNOSIS — E039 Hypothyroidism, unspecified: Secondary | ICD-10-CM | POA: Diagnosis not present

## 2021-02-14 DIAGNOSIS — C50119 Malignant neoplasm of central portion of unspecified female breast: Secondary | ICD-10-CM | POA: Diagnosis not present

## 2021-03-09 DIAGNOSIS — E039 Hypothyroidism, unspecified: Secondary | ICD-10-CM | POA: Diagnosis not present

## 2021-03-09 DIAGNOSIS — R109 Unspecified abdominal pain: Secondary | ICD-10-CM | POA: Diagnosis not present

## 2021-03-09 DIAGNOSIS — E1169 Type 2 diabetes mellitus with other specified complication: Secondary | ICD-10-CM | POA: Diagnosis not present

## 2021-03-11 ENCOUNTER — Ambulatory Visit: Payer: Medicare HMO

## 2021-03-25 DIAGNOSIS — E890 Postprocedural hypothyroidism: Secondary | ICD-10-CM | POA: Diagnosis not present

## 2021-03-27 DIAGNOSIS — I10 Essential (primary) hypertension: Secondary | ICD-10-CM | POA: Diagnosis not present

## 2021-03-27 DIAGNOSIS — E039 Hypothyroidism, unspecified: Secondary | ICD-10-CM | POA: Diagnosis not present

## 2021-03-27 DIAGNOSIS — E1169 Type 2 diabetes mellitus with other specified complication: Secondary | ICD-10-CM | POA: Diagnosis not present

## 2021-04-03 DIAGNOSIS — I10 Essential (primary) hypertension: Secondary | ICD-10-CM | POA: Diagnosis not present

## 2021-04-03 DIAGNOSIS — E890 Postprocedural hypothyroidism: Secondary | ICD-10-CM | POA: Diagnosis not present

## 2021-04-03 DIAGNOSIS — E876 Hypokalemia: Secondary | ICD-10-CM | POA: Diagnosis not present

## 2021-04-03 DIAGNOSIS — C73 Malignant neoplasm of thyroid gland: Secondary | ICD-10-CM | POA: Diagnosis not present

## 2021-04-03 DIAGNOSIS — N951 Menopausal and female climacteric states: Secondary | ICD-10-CM | POA: Diagnosis not present

## 2021-08-17 DIAGNOSIS — I739 Peripheral vascular disease, unspecified: Secondary | ICD-10-CM | POA: Diagnosis not present

## 2021-08-17 DIAGNOSIS — I10 Essential (primary) hypertension: Secondary | ICD-10-CM | POA: Diagnosis not present

## 2021-08-17 DIAGNOSIS — E039 Hypothyroidism, unspecified: Secondary | ICD-10-CM | POA: Diagnosis not present

## 2021-08-17 DIAGNOSIS — E1169 Type 2 diabetes mellitus with other specified complication: Secondary | ICD-10-CM | POA: Diagnosis not present

## 2021-08-27 ENCOUNTER — Other Ambulatory Visit: Payer: Self-pay

## 2021-08-27 DIAGNOSIS — I75023 Atheroembolism of bilateral lower extremities: Secondary | ICD-10-CM

## 2021-08-28 NOTE — Progress Notes (Signed)
[facility] [place] ? ?ASSESSMENT / PLAN: ?71 y.o. female with discoloration of bilateral feet. Suspect chronic venous insufficiency bilaterally.  She has no evidence of hemodynamically significant peripheral arterial disease in either lower extremity or her left arm.  She does describe history of degenerative disc disease and some radicular type discomfort.  Will defer further work-up to her primary physician, Dr. Criss Rosales.  [name] can follow-up with me as needed. ? ?CHIEF COMPLAINT: purple feet ? ?HISTORY OF PRESENT ILLNESS: ?[name] is a 71 y.o. female who is extremely pleasant, who presents to clinic for evaluation of bilateral lower extremity cramping discomfort and purplish discoloration of her feet.  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  She can walk as far and as fast as she likes.  She has no symptoms typical of ischemic rest pain.  She has no ulcers about her feet.  She reports positional discomfort which can give cramping pain in her calf.  She does notice some radiating pain down her buttock.  She has a history of degenerative disc disease.  She has also noticed some tingling and clumsiness in the left hand. ? ?Past Medical History:  ?Diagnosis Date  ? Anemia   ? "years ago"  ? Arthritis   ? "left hand; was in my knees" (09/24/2017)  ? Breast cancer, right breast ([place]) 2004  ? Bruises easily   ? Chronic lower back pain   ? Ductal carcinoma in situ (DCIS) of left breast 2018  ? Genetic testing 04/17/2017  ? STAT Breast panel with reflex to Multi-Cancer panel (83 genes) @ [facility] - No pathogenic mutations detected  ? History of bronchitis   ? when she was young  ? Hypertension   ? no longer taking medications as of August 2019  ? Hypothyroidism   ? takes Synthroid daily  ? Joint pain   ? Joint swelling   ? Personal history of chemotherapy 04/2003  ? Personal history of radiation therapy 2005  ? Pneumonia   ? hx of when she was young  ? Right  elbow tendonitis   ? Sleep apnea   ? has a machine but does not use (09/24/2017)  ? Thyroid cancer ([place]) 2012  ? Urinary frequency   ? ? ?Past Surgical History:  ?Procedure Laterality Date  ? ANTERIOR CERVICAL DECOMP/DISCECTOMY FUSION  2008  ? APPENDECTOMY  2007  ? BREAST BIOPSY Right 02/28/2003  ? malignant  ? BREAST BIOPSY Left 03/2017  ? BREAST LUMPECTOMY Right 2004  ? BREAST RECONSTRUCTION WITH PLACEMENT OF TISSUE EXPANDER AND FLEX HD (ACELLULAR HYDRATED DERMIS) Bilateral 06/22/2017  ? Procedure: IMMEDIATE BILATERAL BREAST RECONSTRUCTION WITH PLACEMENT OF TISSUE EXPANDER AND FLEX HD (ACELLULAR HYDRATED DERMIS);  Surgeon: Wallace Going, DO;  Location: [facility];  Service: Plastics;  Laterality: Bilateral;  ? [place]  ? COLONOSCOPY  X 2  ? INSERTION OF MESH N/A 02/10/2020  ? Procedure: INSERTION OF MESH;  Surgeon: Jovita Kussmaul, MD;  Location: [facility];  Service: General;  Laterality: N/A;  ? KNEE ARTHROSCOPY Right 2007  ? KNEE ARTHROSCOPY  05/02/2011  ? Procedure: ARTHROSCOPY KNEE;  Surgeon: Johnn Hai;  Location: [facility];  Service: Orthopedics;  Laterality: Right;  ? LATISSIMUS FLAP TO BREAST Right 09/23/2017  ? Procedure: RIGHT BREAST LATISSIMUS DORSI FLAP RECONSTRUCTION WITH TISSUE EXPANDER;  Surgeon: Wallace Going, DO;  Location: [facility];  Service: Plastics;  Laterality: Right;  ?  LIPOSUCTION WITH LIPOFILLING Bilateral 02/24/2019  ? Procedure: Bilateral lipofilling of breasts and liposuction of lateral breasts for symmetry;  Surgeon: Wallace Going, DO;  Location: [facility];  Service: Plastics;  Laterality: Bilateral;  2 hours, please  ? MASTECTOMY W/ SENTINEL NODE BIOPSY Bilateral 06/22/2017  ? Procedure: LEFT MASTECTOMY WITH SENTINEL LYMPH NODE BIOPSY AND RIGHT PROPHYLACTIC MASTECTOMY;  Surgeon: Jovita Kussmaul, MD;  Location: [facility];  Service: General;  Laterality: Bilateral;  ? MENISCUS DEBRIDEMENT  05/02/2011   ? Procedure: DEBRIDEMENT OF MENISCUS;  Surgeon: Johnn Hai;  Location: [facility];  Service: Orthopedics;  Laterality: Right;  ? PORTA CATH REMOVAL  2005  ? PORTACATH PLACEMENT  2004  ? REMOVAL OF BILATERAL TISSUE EXPANDERS WITH PLACEMENT OF BILATERAL BREAST IMPLANTS Bilateral 05/26/2018  ? Procedure: REMOVAL OF BILATERAL TISSUE EXPANDERS WITH PLACEMENT OF BILATERAL BREAST IMPLANTS;  Surgeon: Wallace Going, DO;  Location: [facility];  Service: Plastics;  Laterality: Bilateral;  ? REMOVAL OF TISSUE EXPANDER AND PLACEMENT OF IMPLANT Right 12/12/2017  ? Procedure: REMOVAL OF TISSUE EXPANDER, RIGHT;  Surgeon: Irene Limbo, MD;  Location: [facility];  Service: Plastics;  Laterality: Right;  ? THYROIDECTOMY N/A 09/22/2012  ? Procedure: RIGHT THYROID LOBECTOMY WITH FROZEN SECTION;  Surgeon: Izora Gala, MD;  Location: [facility];  Service: ENT;  Laterality: N/A;  ? THYROIDECTOMY Left 10/21/2012  ? Procedure: COMPLETION OF THYROIDECTOMY;  Surgeon: Izora Gala, MD;  Location: [facility];  Service: ENT;  Laterality: Left;  ? TISSUE EXPANDER PLACEMENT Right 03/03/2018  ? Procedure: TISSUE EXPANDER PLACEMENT;  Surgeon: Wallace Going, DO;  Location: [facility];  Service: Plastics;  Laterality: Right;  ? TOTAL KNEE ARTHROPLASTY  04/14/2012  ? Procedure: TOTAL KNEE ARTHROPLASTY;  Surgeon: Ninetta Lights, MD;  Location: [facility];  Service: Orthopedics;  Laterality: Right;  RIGHT ARTHROPLASTY KNEE MEDIAL/LATERAL COMPARTMENTS WITH PATELLA RESURFACING  ? TOTAL KNEE ARTHROPLASTY Left 03/30/2013  ? Procedure: TOTAL KNEE ARTHROPLASTY;  Surgeon: Ninetta Lights, MD;  Location: [facility];  Service: Orthopedics;  Laterality: Left;  ? VENTRAL HERNIA REPAIR N/A 02/10/2020  ? Procedure: LAPAROSCOPIC ASSISTED VENTRAL HERNIA REPAIR;  Surgeon: Jovita Kussmaul, MD;  Location: [facility];  Service: General;  Laterality: N/A;  ? ? ?Family History  ?Problem Relation Age of Onset  ? Breast cancer Mother 54  ?      currently 8  ? Liver cancer Father   ? Breast cancer Other   ?     pat grandfather's sister; dx 11s  ? ? ?Social History  ? ?Socioeconomic History  ? Marital status: Single  ?  Spouse name: Not on file  ? Number of children: Not on file  ? Years of education: Not on file  ? Highest education level: Not on file  ?Occupational History  ? Not on file  ?Tobacco Use  ? Smoking status: Never  ? Smokeless tobacco: Never  ?Vaping Use  ? Vaping Use: Never used  ?Substance and Sexual Activity  ? Alcohol use: Yes  ?  Comment: occasional  ? Drug use: Not Currently  ? Sexual activity: Not Currently  ?  Birth control/protection: Post-menopausal  ?Other Topics Concern  ? Not on file  ?Social History Narrative  ? Lives with Yolanda Bonine at present  ? ?Social Determinants of Health  ? ?Financial Resource Strain: Not on file  ?Food Insecurity: Not on file  ?Transportation Needs: Not on file  ?Physical Activity: Not on  file  ?Stress: Not on file  ?Social Connections: Not on file  ?Intimate Partner Violence: Not on file  ? ? ?No Known Allergies ? ?Current Outpatient Medications  ?Medication Sig Dispense Refill  ? cholecalciferol (VITAMIN D) 1000 units tablet Take 1,000 Units by mouth daily.    ? HYDROcodone-acetaminophen (NORCO/VICODIN) 5-325 MG tablet Take 1-2 tablets by mouth every 6 (six) hours as needed for moderate pain or severe pain. 15 tablet 0  ? HYDROcodone-acetaminophen (NORCO/VICODIN) 5-325 MG tablet Take 1-2 tablets by mouth every 6 (six) hours as needed for moderate pain or severe pain. 15 tablet 0  ? Multiple Vitamin (MULTIVITAMIN WITH MINERALS) TABS tablet Take 1 tablet by mouth daily.    ? potassium chloride (KLOR-CON) 10 MEQ tablet Take 10 mEq by mouth 3 (three) times daily.     ? spironolactone (ALDACTONE) 100 MG tablet Take 100 mg by mouth daily.    ? SYNTHROID 100 MCG tablet Take 100 mcg by mouth See admin instructions. Take 100 mcg by mouth before breakfast Monday-Thursday    ? SYNTHROID 88 MCG tablet Take 88 mcg by  mouth See admin instructions. Take 88 mcg by mouth Friday, Saturday and Sunday    ? chlorthalidone (HYGROTON) 25 MG tablet Take 25 mg by mouth daily. (Patient not taking: Reported on 10/25/2020)    ? ?N

## 2021-08-29 ENCOUNTER — Encounter: Payer: Self-pay | Admitting: Vascular Surgery

## 2021-08-29 ENCOUNTER — Ambulatory Visit (HOSPITAL_COMMUNITY)
Admission: RE | Admit: 2021-08-29 | Discharge: 2021-08-29 | Disposition: A | Discharge status: Home / Self Care | Payer: Medicare PPO | Source: Ambulatory Visit | Attending: Vascular Surgery | Admitting: Vascular Surgery

## 2021-08-29 ENCOUNTER — Ambulatory Visit: Payer: Medicare PPO | Admitting: Vascular Surgery

## 2021-08-29 VITALS — BP 159/112 | HR 68 | Temp 97.5°F | Resp 16 | Ht 64.0 in | Wt 202.0 lb

## 2021-08-29 DIAGNOSIS — I75023 Atheroembolism of bilateral lower extremities: Secondary | ICD-10-CM | POA: Diagnosis not present

## 2021-08-29 DIAGNOSIS — I872 Venous insufficiency (chronic) (peripheral): Secondary | ICD-10-CM

## 2021-10-25 DIAGNOSIS — E1169 Type 2 diabetes mellitus with other specified complication: Secondary | ICD-10-CM | POA: Diagnosis not present

## 2021-10-25 DIAGNOSIS — I10 Essential (primary) hypertension: Secondary | ICD-10-CM | POA: Diagnosis not present

## 2021-11-29 ENCOUNTER — Ambulatory Visit: Payer: Medicare HMO | Admitting: Plastic Surgery

## 2021-12-05 DIAGNOSIS — C73 Malignant neoplasm of thyroid gland: Secondary | ICD-10-CM | POA: Diagnosis not present

## 2021-12-05 DIAGNOSIS — I10 Essential (primary) hypertension: Secondary | ICD-10-CM | POA: Diagnosis not present

## 2021-12-05 DIAGNOSIS — C50119 Malignant neoplasm of central portion of unspecified female breast: Secondary | ICD-10-CM | POA: Diagnosis not present

## 2021-12-05 DIAGNOSIS — E1169 Type 2 diabetes mellitus with other specified complication: Secondary | ICD-10-CM | POA: Diagnosis not present

## 2021-12-05 DIAGNOSIS — Z0000 Encounter for general adult medical examination without abnormal findings: Secondary | ICD-10-CM | POA: Diagnosis not present

## 2022-01-25 DIAGNOSIS — I10 Essential (primary) hypertension: Secondary | ICD-10-CM | POA: Diagnosis not present

## 2022-01-25 DIAGNOSIS — E1169 Type 2 diabetes mellitus with other specified complication: Secondary | ICD-10-CM | POA: Diagnosis not present

## 2022-01-25 DIAGNOSIS — E039 Hypothyroidism, unspecified: Secondary | ICD-10-CM | POA: Diagnosis not present

## 2022-01-28 DIAGNOSIS — I10 Essential (primary) hypertension: Secondary | ICD-10-CM | POA: Diagnosis not present

## 2022-01-28 DIAGNOSIS — C73 Malignant neoplasm of thyroid gland: Secondary | ICD-10-CM | POA: Diagnosis not present

## 2022-01-28 DIAGNOSIS — E876 Hypokalemia: Secondary | ICD-10-CM | POA: Diagnosis not present

## 2022-01-28 DIAGNOSIS — E890 Postprocedural hypothyroidism: Secondary | ICD-10-CM | POA: Diagnosis not present

## 2022-02-04 DIAGNOSIS — E876 Hypokalemia: Secondary | ICD-10-CM | POA: Diagnosis not present

## 2022-02-04 DIAGNOSIS — N951 Menopausal and female climacteric states: Secondary | ICD-10-CM | POA: Diagnosis not present

## 2022-02-04 DIAGNOSIS — E890 Postprocedural hypothyroidism: Secondary | ICD-10-CM | POA: Diagnosis not present

## 2022-02-04 DIAGNOSIS — Z23 Encounter for immunization: Secondary | ICD-10-CM | POA: Diagnosis not present

## 2022-02-04 DIAGNOSIS — C73 Malignant neoplasm of thyroid gland: Secondary | ICD-10-CM | POA: Diagnosis not present

## 2022-02-04 DIAGNOSIS — I10 Essential (primary) hypertension: Secondary | ICD-10-CM | POA: Diagnosis not present

## 2022-02-14 ENCOUNTER — Other Ambulatory Visit (HOSPITAL_COMMUNITY): Payer: Self-pay | Admitting: Endocrinology

## 2022-02-14 DIAGNOSIS — C73 Malignant neoplasm of thyroid gland: Secondary | ICD-10-CM

## 2022-02-18 NOTE — Written Directive (Addendum)
MOLECULAR IMAGING AND THERAPEUTICS WRITTEN DIRECTIVE   PATIENT NAME: [name]  PT DOB:   1950/07/06                                              MRN: [id number]  ---------------------------------------------------------------------------------------------------------------------  I-131 WHOLE BODY SCAN    RADIOPHARMACEUTICAL: Iodine-131 Capsule for Diagnostic Imaging   PRESCRIBED DOSE FOR ADMINISTRATION: 4 mCi   ROUTE OFADMINISTRATION: PO   DIAGNOSIS: Malignant neoplasm of thyroid gland   REFERRING PHYSICIAN: Jacelyn Pi, MD   THYROGEN STIMULATION OR HORMONE WITHDRAW: yes, Thyrogen Stimulated  DATE OF THYROIDECTOMY:  Right side, 09/22/12                                                    Total thyroidectomy, 10/21/2012   SURGEON:  Izora Gala, MD   TSH:  Collected on 01/28/2022 (0.683)  (Ref. Range:  0.450-4.500 uIU/mL)   Lab Results  Component Value Date   TSH 3.333 11/28/2011     PRIOR I-131 THERAPY (Date and Dose): 01/18/2018   149 mCi 11/08/2012   105.1 mCi  ADDITIONAL PHYSICIAN COMMENTS/NOTES   AUTHORIZED USER SIGNATURE & TIME STAMP: Rennis Golden, MD   02/18/22    2:21 PM

## 2022-02-24 ENCOUNTER — Encounter (HOSPITAL_COMMUNITY)
Admission: RE | Admit: 2022-02-24 | Discharge: 2022-02-24 | Disposition: A | Discharge status: Home / Self Care | Payer: Medicare PPO | Source: Ambulatory Visit | Attending: Endocrinology | Admitting: Endocrinology

## 2022-02-24 DIAGNOSIS — C73 Malignant neoplasm of thyroid gland: Secondary | ICD-10-CM | POA: Insufficient documentation

## 2022-02-24 MED ORDER — STERILE WATER FOR INJECTION IJ SOLN
INTRAMUSCULAR | Status: AC
  Filled 2022-02-24: qty 10

## 2022-02-24 MED ORDER — THYROTROPIN ALFA 0.9 MG IM SOLR
0.9000 mg | INTRAMUSCULAR | Status: AC
  Administered 2022-02-24: 0.9 mg via INTRAMUSCULAR

## 2022-02-25 ENCOUNTER — Encounter (HOSPITAL_COMMUNITY)
Admission: RE | Admit: 2022-02-25 | Discharge: 2022-02-25 | Disposition: A | Discharge status: Home / Self Care | Payer: Medicare PPO | Source: Ambulatory Visit | Attending: Endocrinology | Admitting: Endocrinology

## 2022-02-25 DIAGNOSIS — C73 Malignant neoplasm of thyroid gland: Secondary | ICD-10-CM | POA: Diagnosis not present

## 2022-02-25 MED ORDER — STERILE WATER FOR INJECTION IJ SOLN
INTRAMUSCULAR | Status: AC
  Filled 2022-02-25: qty 10

## 2022-02-25 MED ORDER — THYROTROPIN ALFA 0.9 MG IM SOLR
0.9000 mg | INTRAMUSCULAR | Status: AC
  Administered 2022-02-25: 0.9 mg via INTRAMUSCULAR

## 2022-02-26 ENCOUNTER — Encounter (HOSPITAL_COMMUNITY)
Admission: RE | Admit: 2022-02-26 | Discharge: 2022-02-26 | Disposition: A | Discharge status: Home / Self Care | Payer: Medicare PPO | Source: Ambulatory Visit | Attending: Endocrinology | Admitting: Endocrinology

## 2022-02-26 DIAGNOSIS — Z9011 Acquired absence of right breast and nipple: Secondary | ICD-10-CM | POA: Diagnosis not present

## 2022-02-26 DIAGNOSIS — C73 Malignant neoplasm of thyroid gland: Secondary | ICD-10-CM | POA: Diagnosis not present

## 2022-02-26 DIAGNOSIS — C50912 Malignant neoplasm of unspecified site of left female breast: Secondary | ICD-10-CM | POA: Diagnosis not present

## 2022-02-26 MED ORDER — SODIUM IODIDE I 131 CAPSULE
3.9000 | Freq: Once | INTRAVENOUS | Status: AC | PRN
  Administered 2022-02-26: 3.9 via ORAL

## 2022-02-28 ENCOUNTER — Encounter (HOSPITAL_COMMUNITY)
Admission: RE | Admit: 2022-02-28 | Discharge: 2022-02-28 | Disposition: A | Discharge status: Home / Self Care | Payer: Medicare PPO | Source: Ambulatory Visit | Attending: Endocrinology | Admitting: Endocrinology

## 2022-02-28 DIAGNOSIS — C73 Malignant neoplasm of thyroid gland: Secondary | ICD-10-CM | POA: Diagnosis not present

## 2022-02-28 DIAGNOSIS — E890 Postprocedural hypothyroidism: Secondary | ICD-10-CM | POA: Diagnosis not present

## 2022-02-28 MED ORDER — SODIUM IODIDE I 131 CAPSULE
3.9000 | Freq: Once | INTRAVENOUS | Status: AC | PRN
  Administered 2022-02-28: 3.9 via ORAL

## 2022-03-04 ENCOUNTER — Ambulatory Visit: Payer: Medicare PPO | Admitting: Plastic Surgery

## 2022-03-04 ENCOUNTER — Encounter: Payer: Self-pay | Admitting: Plastic Surgery

## 2022-03-04 DIAGNOSIS — Z09 Encounter for follow-up examination after completed treatment for conditions other than malignant neoplasm: Secondary | ICD-10-CM | POA: Diagnosis not present

## 2022-03-04 DIAGNOSIS — Z9889 Other specified postprocedural states: Secondary | ICD-10-CM

## 2022-03-04 DIAGNOSIS — Z923 Personal history of irradiation: Secondary | ICD-10-CM | POA: Diagnosis not present

## 2022-03-04 DIAGNOSIS — D0512 Intraductal carcinoma in situ of left breast: Secondary | ICD-10-CM

## 2022-03-04 DIAGNOSIS — Z853 Personal history of malignant neoplasm of breast: Secondary | ICD-10-CM | POA: Diagnosis not present

## 2022-03-04 NOTE — Progress Notes (Signed)
Patient ID: [name], female    DOB: 06-Oct-1950, 71 y.o.   MRN: [id number]   Chief Complaint  Patient presents with   Follow-up   Breast Problem    The patient is a 71 year old female here for a yearly follow-up from her breast reconstruction.  She was diagnosed with left breast cancer which was ductal carcinoma in situ.  It was in the upper outer quadrant.  It was estrogen and progesterone positive.  She had right breast cancer 15 years previous to that with a partial mastectomy and radiation.  She ended up with bilateral mastectomies.  She had some complications as expected on the right side and then ended up with a latissimus muscle flap in May 2019.  She then had implants placed in January 2020 and has a Product manager smooth round high-profile gel 700 cc implant on the left.  On the right she has a Mentor smooth round high-profile gel 450 cc implant.  She had lipo filling in October 2022 both breasts.  Since then she has been very diligent about her weight and has decreased it.  She is now 199 pounds.  It is down from 220 pounds.  She is 5 feet 4 inches tall.  She is planning on losing at least 10 more pounds.  Overall she is doing really well.  On exam there are no areas of concern.  Her implants are soft and there does not appear to be any capsular contractures.     Review of Systems  Constitutional: Negative.   HENT: Negative.    Eyes: Negative.   Respiratory: Negative.  Negative for chest tightness.   Cardiovascular: Negative.   Gastrointestinal: Negative.   Endocrine: Negative.   Genitourinary: Negative.   Musculoskeletal: Negative.     Past Medical History:  Diagnosis Date   Anemia    "years ago"   Arthritis    "left hand; was in my knees" (09/24/2017)   Breast cancer, right breast ([place]) 2004   Bruises easily    Chronic lower back pain    Ductal carcinoma in situ (DCIS) of left breast 2018   Genetic testing 04/17/2017   STAT Breast panel with reflex to Multi-Cancer  panel (83 genes) @ [facility] - No pathogenic mutations detected   History of bronchitis    when she was young   Hypertension    no longer taking medications as of August 2019   Hypothyroidism    takes Synthroid daily   Joint pain    Joint swelling    Personal history of chemotherapy 04/2003   Personal history of radiation therapy 2005   Pneumonia    hx of when she was young   Right elbow tendonitis    Sleep apnea    has a machine but does not use (09/24/2017)   Thyroid cancer ([place]) 2012   Urinary frequency     Past Surgical History:  Procedure Laterality Date   ANTERIOR CERVICAL DECOMP/DISCECTOMY FUSION  2008   APPENDECTOMY  2007   BREAST BIOPSY Right 02/28/2003   malignant   BREAST BIOPSY Left 03/2017   BREAST LUMPECTOMY Right 2004   BREAST RECONSTRUCTION WITH PLACEMENT OF TISSUE EXPANDER AND FLEX HD (ACELLULAR HYDRATED DERMIS) Bilateral 06/22/2017   Procedure: IMMEDIATE BILATERAL BREAST RECONSTRUCTION WITH PLACEMENT OF TISSUE EXPANDER AND FLEX HD (ACELLULAR HYDRATED DERMIS);  Surgeon: Wallace Going, DO;  Location: [facility];  Service: Plastics;  Laterality: Bilateral;   [place]   COLONOSCOPY  X 2   INSERTION OF MESH N/A 02/10/2020   Procedure: INSERTION OF MESH;  Surgeon: Jovita Kussmaul, MD;  Location: [facility];  Service: General;  Laterality: N/A;   KNEE ARTHROSCOPY Right 2007   KNEE ARTHROSCOPY  05/02/2011   Procedure: ARTHROSCOPY KNEE;  Surgeon: Johnn Hai;  Location: [facility];  Service: Orthopedics;  Laterality: Right;   LATISSIMUS FLAP TO BREAST Right 09/23/2017   Procedure: RIGHT BREAST LATISSIMUS DORSI FLAP RECONSTRUCTION WITH TISSUE EXPANDER;  Surgeon: Wallace Going, DO;  Location: [facility];  Service: Plastics;  Laterality: Right;   LIPOSUCTION WITH LIPOFILLING Bilateral 02/24/2019   Procedure: Bilateral lipofilling of breasts and liposuction of lateral breasts for symmetry;  Surgeon: Wallace Going, DO;   Location: [facility];  Service: Plastics;  Laterality: Bilateral;  2 hours, please   MASTECTOMY W/ SENTINEL NODE BIOPSY Bilateral 06/22/2017   Procedure: LEFT MASTECTOMY WITH SENTINEL LYMPH NODE BIOPSY AND RIGHT PROPHYLACTIC MASTECTOMY;  Surgeon: Jovita Kussmaul, MD;  Location: [facility];  Service: General;  Laterality: Bilateral;   MENISCUS DEBRIDEMENT  05/02/2011   Procedure: DEBRIDEMENT OF MENISCUS;  Surgeon: Johnn Hai;  Location: [facility];  Service: Orthopedics;  Laterality: Right;   PORTA CATH REMOVAL  2005   PORTACATH PLACEMENT  2004   REMOVAL OF BILATERAL TISSUE EXPANDERS WITH PLACEMENT OF BILATERAL BREAST IMPLANTS Bilateral 05/26/2018   Procedure: REMOVAL OF BILATERAL TISSUE EXPANDERS WITH PLACEMENT OF BILATERAL BREAST IMPLANTS;  Surgeon: Wallace Going, DO;  Location: [facility];  Service: Plastics;  Laterality: Bilateral;   REMOVAL OF TISSUE EXPANDER AND PLACEMENT OF IMPLANT Right 12/12/2017   Procedure: REMOVAL OF TISSUE EXPANDER, RIGHT;  Surgeon: Irene Limbo, MD;  Location: [facility];  Service: Plastics;  Laterality: Right;   THYROIDECTOMY N/A 09/22/2012   Procedure: RIGHT THYROID LOBECTOMY WITH FROZEN SECTION;  Surgeon: Izora Gala, MD;  Location: [facility];  Service: ENT;  Laterality: N/A;   THYROIDECTOMY Left 10/21/2012   Procedure: COMPLETION OF THYROIDECTOMY;  Surgeon: Izora Gala, MD;  Location: [facility];  Service: ENT;  Laterality: Left;   TISSUE EXPANDER PLACEMENT Right 03/03/2018   Procedure: TISSUE EXPANDER PLACEMENT;  Surgeon: Wallace Going, DO;  Location: [employer] [facility];  Service: Plastics;  Laterality: Right;   TOTAL KNEE ARTHROPLASTY  04/14/2012   Procedure: TOTAL KNEE ARTHROPLASTY;  Surgeon: Ninetta Lights, MD;  Location: [facility];  Service: Orthopedics;  Laterality: Right;  RIGHT ARTHROPLASTY KNEE MEDIAL/LATERAL COMPARTMENTS WITH PATELLA RESURFACING   TOTAL KNEE ARTHROPLASTY Left 03/30/2013    Procedure: TOTAL KNEE ARTHROPLASTY;  Surgeon: Ninetta Lights, MD;  Location: [facility];  Service: Orthopedics;  Laterality: Left;   VENTRAL HERNIA REPAIR N/A 02/10/2020   Procedure: LAPAROSCOPIC ASSISTED VENTRAL HERNIA REPAIR;  Surgeon: Jovita Kussmaul, MD;  Location: [facility];  Service: General;  Laterality: N/A;      Current Outpatient Medications:    atorvastatin (LIPITOR) 10 MG tablet, Take by mouth., Disp: , Rfl:    JARDIANCE 25 MG TABS tablet, , Disp: , Rfl:    Multiple Vitamin (MULTIVITAMIN WITH MINERALS) TABS tablet, Take 1 tablet by mouth daily., Disp: , Rfl:    potassium chloride (KLOR-CON) 10 MEQ tablet, Take 10 mEq by mouth 3 (three) times daily. , Disp: , Rfl:    spironolactone (ALDACTONE) 100 MG tablet, Take 100 mg by mouth daily., Disp: , Rfl:    SYNTHROID 100 MCG tablet, Take 100 mcg by mouth See  admin instructions. Take 100 mcg by mouth before breakfast Monday-Thursday, Disp: , Rfl:    SYNTHROID 88 MCG tablet, Take 88 mcg by mouth See admin instructions. Take 88 mcg by mouth Friday, Saturday and Sunday, Disp: , Rfl:    Objective:   There were no vitals filed for this visit.  Physical Exam Vitals and nursing note reviewed.  Constitutional:      Appearance: Normal appearance.  HENT:     Head: Normocephalic and atraumatic.  Cardiovascular:     Rate and Rhythm: Normal rate.     Pulses: Normal pulses.  Pulmonary:     Effort: Pulmonary effort is normal.  Abdominal:     General: There is no distension.     Palpations: Abdomen is soft.     Tenderness: There is no abdominal tenderness.  Skin:    General: Skin is warm.     Capillary Refill: Capillary refill takes less than 2 seconds.     Coloration: Skin is not jaundiced.     Findings: No bruising or lesion.  Neurological:     Mental Status: She is alert and oriented to person, place, and time.  Psychiatric:        Mood and Affect: Mood normal.        Behavior: Behavior normal.        Thought Content: Thought content  normal.        Judgment: Judgment normal.     Assessment & Plan:  History of reconstruction of both breasts  History of breast cancer in female  S/P breast reconstruction, bilateral  Ductal carcinoma in situ (DCIS) of left breast  Continue massage as able.  Can go with regular bra.  Once the patient is down to her desired weight we can do some more adjustments with fat filling and skin tightening.  The patient is happy with this plan and we will see her in follow-up in 1 year.  Pictures were obtained of the patient and placed in the chart with the patient's or guardian's permission.   [place], DO

## 2022-03-06 ENCOUNTER — Telehealth: Payer: Self-pay | Admitting: *Deleted

## 2022-03-06 NOTE — Telephone Encounter (Signed)
Received on (02/26/2022) via of fax DME Standard Written Order from [facility].  Requesting signature and return.  Given to provider to sign and return.     DME Standard Written Order signed and faxed back to [facility].  Confirmation received and copy scanned into the chart.//AB/CMA

## 2022-03-12 ENCOUNTER — Other Ambulatory Visit (HOSPITAL_COMMUNITY): Payer: Self-pay | Admitting: Endocrinology

## 2022-03-12 DIAGNOSIS — Z9011 Acquired absence of right breast and nipple: Secondary | ICD-10-CM | POA: Diagnosis not present

## 2022-03-12 DIAGNOSIS — C50912 Malignant neoplasm of unspecified site of left female breast: Secondary | ICD-10-CM | POA: Diagnosis not present

## 2022-03-12 DIAGNOSIS — C73 Malignant neoplasm of thyroid gland: Secondary | ICD-10-CM

## 2022-03-14 ENCOUNTER — Encounter: Payer: Self-pay | Admitting: Podiatry

## 2022-03-14 ENCOUNTER — Ambulatory Visit: Payer: Medicare PPO | Admitting: Podiatry

## 2022-03-14 DIAGNOSIS — B351 Tinea unguium: Secondary | ICD-10-CM | POA: Diagnosis not present

## 2022-03-14 DIAGNOSIS — M79675 Pain in left toe(s): Secondary | ICD-10-CM | POA: Diagnosis not present

## 2022-03-14 DIAGNOSIS — L309 Dermatitis, unspecified: Secondary | ICD-10-CM

## 2022-03-14 DIAGNOSIS — M79674 Pain in right toe(s): Secondary | ICD-10-CM | POA: Diagnosis not present

## 2022-03-15 DIAGNOSIS — R946 Abnormal results of thyroid function studies: Secondary | ICD-10-CM | POA: Diagnosis not present

## 2022-03-15 DIAGNOSIS — I10 Essential (primary) hypertension: Secondary | ICD-10-CM | POA: Diagnosis not present

## 2022-03-15 DIAGNOSIS — E781 Pure hyperglyceridemia: Secondary | ICD-10-CM | POA: Diagnosis not present

## 2022-03-15 DIAGNOSIS — E1169 Type 2 diabetes mellitus with other specified complication: Secondary | ICD-10-CM | POA: Diagnosis not present

## 2022-03-15 NOTE — Progress Notes (Signed)
Subjective:   Patient ID: [name], female   DOB: 71 y.o.   MRN: [id number]   HPI Patient presents concerned about thickness of her nailbeds and discoloration of the interdigital area.  It is not itchy patient did try Kerasal.  Patient states the nails do get sore and it is difficult for her to cut them   Review of Systems  All other systems reviewed and are negative.       Objective:  Physical Exam Vitals and nursing note reviewed.  Constitutional:      Appearance: She is well-developed.  Pulmonary:     Effort: Pulmonary effort is normal.  Musculoskeletal:        General: Normal range of motion.  Skin:    General: Skin is warm.  Neurological:     Mental Status: She is alert.     Neurovascular status was found to be intact muscle strength found to be adequate range of motion adequate with thickness of the nailbeds 1-5 both feet with some distal discoloration and probable trauma to the second third and fourth toes right with patient remembering injury that had occurred prior to this all starting     Assessment:  Probability that this is more of a traumatic element versus a fungal element and that she does have chronic mycotic nail infection 1-5 both feet with pain     Plan:  H&P discussed this with her discussed trauma also discussed the possibility for vascular Raynaud's type phenomenon if we see other issues.  I went ahead today debrided nailbeds 1-5 both feet no angiogenic bleeding reappoint routine care

## 2022-04-02 ENCOUNTER — Encounter (HOSPITAL_COMMUNITY)
Admission: RE | Admit: 2022-04-02 | Discharge: 2022-04-02 | Disposition: A | Discharge status: Home / Self Care | Payer: Medicare PPO | Source: Ambulatory Visit | Attending: Endocrinology | Admitting: Endocrinology

## 2022-04-02 DIAGNOSIS — C73 Malignant neoplasm of thyroid gland: Secondary | ICD-10-CM | POA: Insufficient documentation

## 2022-04-02 LAB — GLUCOSE, CAPILLARY: Glucose-Capillary: 96 mg/dL (ref 70–99)

## 2022-04-02 MED ORDER — FLUDEOXYGLUCOSE F - 18 (FDG) INJECTION
10.1000 | Freq: Once | INTRAVENOUS | Status: AC
  Administered 2022-04-02: 10 via INTRAVENOUS

## 2022-04-18 DIAGNOSIS — E890 Postprocedural hypothyroidism: Secondary | ICD-10-CM | POA: Diagnosis not present

## 2022-04-18 DIAGNOSIS — C73 Malignant neoplasm of thyroid gland: Secondary | ICD-10-CM | POA: Diagnosis not present

## 2023-03-04 ENCOUNTER — Other Ambulatory Visit: Payer: Self-pay | Admitting: Obstetrics and Gynecology

## 2023-03-06 ENCOUNTER — Ambulatory Visit: Payer: Medicare PPO | Admitting: Plastic Surgery

## 2023-04-03 ENCOUNTER — Ambulatory Visit (HOSPITAL_BASED_OUTPATIENT_CLINIC_OR_DEPARTMENT_OTHER)
Admission: RE | Admit: 2023-04-03 | Payer: Medicare Other | Source: Home / Self Care | Admitting: Obstetrics and Gynecology

## 2023-04-03 SURGERY — DILATATION & CURETTAGE/HYSTEROSCOPY WITH MYOSURE
Anesthesia: General

## 2023-05-11 ENCOUNTER — Ambulatory Visit: Payer: Medicare Other | Admitting: Podiatry

## 2023-05-12 ENCOUNTER — Ambulatory Visit: Payer: Medicare PPO | Admitting: Podiatry

## 2023-05-15 ENCOUNTER — Ambulatory Visit: Payer: Medicare Other | Admitting: Orthopaedic Surgery

## 2023-05-18 ENCOUNTER — Ambulatory Visit: Payer: Medicare Other | Admitting: Podiatry

## 2023-05-18 ENCOUNTER — Encounter (INDEPENDENT_AMBULATORY_CARE_PROVIDER_SITE_OTHER): Payer: Medicare Other | Admitting: Podiatry

## 2023-05-18 DIAGNOSIS — Z91199 Patient's noncompliance with other medical treatment and regimen due to unspecified reason: Secondary | ICD-10-CM

## 2023-05-18 NOTE — Progress Notes (Signed)
Patient was a no-show for her scheduled now patient appointment at 8:30 this morning.

## 2023-05-19 ENCOUNTER — Ambulatory Visit: Payer: Medicare PPO | Admitting: Plastic Surgery

## 2023-05-21 ENCOUNTER — Telehealth: Payer: Self-pay | Admitting: Plastic Surgery

## 2023-05-21 NOTE — Telephone Encounter (Signed)
Called and confirmed she's coming

## 2023-05-22 ENCOUNTER — Ambulatory Visit: Payer: Medicare Other | Admitting: Plastic Surgery

## 2023-05-22 ENCOUNTER — Encounter: Payer: Self-pay | Admitting: Plastic Surgery

## 2023-05-22 ENCOUNTER — Ambulatory Visit: Payer: Medicare Other | Admitting: Orthopaedic Surgery

## 2023-05-22 VITALS — BP 126/77 | HR 83

## 2023-05-22 DIAGNOSIS — N651 Disproportion of reconstructed breast: Secondary | ICD-10-CM | POA: Diagnosis not present

## 2023-05-22 DIAGNOSIS — Z9011 Acquired absence of right breast and nipple: Secondary | ICD-10-CM

## 2023-05-22 DIAGNOSIS — D0512 Intraductal carcinoma in situ of left breast: Secondary | ICD-10-CM

## 2023-05-22 DIAGNOSIS — Z923 Personal history of irradiation: Secondary | ICD-10-CM | POA: Diagnosis not present

## 2023-05-22 DIAGNOSIS — Z853 Personal history of malignant neoplasm of breast: Secondary | ICD-10-CM | POA: Diagnosis not present

## 2023-05-22 DIAGNOSIS — Z9889 Other specified postprocedural states: Secondary | ICD-10-CM

## 2023-05-22 NOTE — Progress Notes (Signed)
   Subjective:    Patient ID: [name], female    DOB: Sep 13, 1950, 73 y.o.   MRN: [id number]  The patient is a 73 year old female here for follow-up on her breast reconstruction.  She was diagnosed with left breast cancer which was ductal carcinoma in situ.  It was estrogen and progesterone positive.  She had right breast cancer in the past for which she had a partial mastectomy and radiation 20 years ago.  She is 5 feet 4 inches tall and I first saw her in 2019 and she weighed 218 pounds.  The patient underwent bilateral mastectomies with reconstruction using expanders in February 2019.  In May she had a latissimus muscle flap to the right breast because of her history of radiation.  Then in November 2019 she had a right breast implant placed which was 455 cc.  In January 2020 she had a 700 cc implant placed on the left side which was a Dance movement psychotherapist smooth round high-profile gel and then on the right she had a 450 cc Mentor smooth round high-profile gel implant placed.  In October 2020 she had lipo filling of both breast for improved symmetry.  She has lost a little bit of weight since her original surgery and has a little bit of asymmetry now with some tightening on the right lateral side and some excess tissue on the left lateral side with some excess skin on the medial left side.  The patient works from home for [employer].      Review of Systems  Constitutional: Negative.   HENT: Negative.    Eyes: Negative.   Respiratory: Negative.    Cardiovascular: Negative.   Gastrointestinal: Negative.   Endocrine: Negative.   Genitourinary: Negative.   Musculoskeletal: Negative.        Objective:   Physical Exam Vitals reviewed.  Constitutional:      Appearance: Normal appearance.  HENT:     Head: Atraumatic.  Cardiovascular:     Rate and Rhythm: Normal rate.     Pulses: Normal pulses.  Pulmonary:     Effort: Pulmonary effort is normal.  Skin:    General: Skin is warm.      Capillary Refill: Capillary refill takes less than 2 seconds.  Neurological:     Mental Status: She is alert and oriented to person, place, and time.  Psychiatric:        Mood and Affect: Mood normal.        Behavior: Behavior normal.        Thought Content: Thought content normal.        Judgment: Judgment normal.           Assessment & Plan:     ICD-10-CM   1. S/P breast reconstruction, bilateral  Z98.890     2. History of reconstruction of both breasts  Z98.890     3. History of breast cancer in female  Z63.3     4. Acquired absence of right breast  Z90.11     5. Ductal carcinoma in situ (DCIS) of left breast  D05.12     6. Breast asymmetry following reconstructive surgery  N65.1       Plan for fat grafting to the right breast with liposuction and excision of excess tissue on the left breast.  Pictures were obtained of the patient and placed in the chart with the patient's or guardian's permission.

## 2023-05-28 ENCOUNTER — Ambulatory Visit: Payer: Medicare Other | Admitting: Orthopaedic Surgery

## 2023-05-29 ENCOUNTER — Ambulatory Visit: Payer: Medicare Other | Admitting: Orthopaedic Surgery

## 2023-06-03 DIAGNOSIS — E890 Postprocedural hypothyroidism: Secondary | ICD-10-CM | POA: Diagnosis not present

## 2023-06-05 DIAGNOSIS — E039 Hypothyroidism, unspecified: Secondary | ICD-10-CM | POA: Diagnosis not present

## 2023-06-05 DIAGNOSIS — E785 Hyperlipidemia, unspecified: Secondary | ICD-10-CM | POA: Diagnosis not present

## 2023-06-05 DIAGNOSIS — I10 Essential (primary) hypertension: Secondary | ICD-10-CM | POA: Diagnosis not present

## 2023-06-05 DIAGNOSIS — R7303 Prediabetes: Secondary | ICD-10-CM | POA: Diagnosis not present

## 2023-06-08 ENCOUNTER — Telehealth: Payer: Self-pay | Admitting: Podiatry

## 2023-06-08 ENCOUNTER — Encounter: Payer: Medicare Other | Admitting: Podiatry

## 2023-06-08 NOTE — Progress Notes (Signed)
 Patient did not show for her scheduled appointment this morning.

## 2023-06-08 NOTE — Telephone Encounter (Signed)
 Pt left message on 2/8 at 904pm stating she got a call about her appt on Monday 2/10 and it was scheduled as a np and she is not a new pt. She will call to r/s  I returned call and left message that I received her message and canceled her appt.

## 2023-06-09 ENCOUNTER — Ambulatory Visit: Payer: Medicare Other | Admitting: Orthopaedic Surgery

## 2023-06-09 DIAGNOSIS — E785 Hyperlipidemia, unspecified: Secondary | ICD-10-CM | POA: Diagnosis not present

## 2023-06-09 DIAGNOSIS — C73 Malignant neoplasm of thyroid gland: Secondary | ICD-10-CM | POA: Diagnosis not present

## 2023-06-09 DIAGNOSIS — Z0001 Encounter for general adult medical examination with abnormal findings: Secondary | ICD-10-CM | POA: Diagnosis not present

## 2023-06-09 DIAGNOSIS — E1169 Type 2 diabetes mellitus with other specified complication: Secondary | ICD-10-CM | POA: Diagnosis not present

## 2023-06-11 ENCOUNTER — Other Ambulatory Visit: Payer: Self-pay | Admitting: Endocrinology

## 2023-06-11 DIAGNOSIS — C73 Malignant neoplasm of thyroid gland: Secondary | ICD-10-CM

## 2023-06-11 DIAGNOSIS — I10 Essential (primary) hypertension: Secondary | ICD-10-CM | POA: Diagnosis not present

## 2023-06-11 DIAGNOSIS — E890 Postprocedural hypothyroidism: Secondary | ICD-10-CM | POA: Diagnosis not present

## 2023-06-11 DIAGNOSIS — E876 Hypokalemia: Secondary | ICD-10-CM | POA: Diagnosis not present

## 2023-06-12 ENCOUNTER — Ambulatory Visit
Admission: RE | Admit: 2023-06-12 | Discharge: 2023-06-12 | Disposition: A | Discharge status: Home / Self Care | Payer: Medicare Other | Source: Ambulatory Visit | Attending: Endocrinology | Admitting: Endocrinology

## 2023-06-12 DIAGNOSIS — E0789 Other specified disorders of thyroid: Secondary | ICD-10-CM | POA: Diagnosis not present

## 2023-06-12 DIAGNOSIS — C73 Malignant neoplasm of thyroid gland: Secondary | ICD-10-CM

## 2023-06-16 ENCOUNTER — Other Ambulatory Visit (INDEPENDENT_AMBULATORY_CARE_PROVIDER_SITE_OTHER): Payer: Medicare Other

## 2023-06-16 ENCOUNTER — Ambulatory Visit: Payer: Medicare Other | Admitting: Orthopaedic Surgery

## 2023-06-16 DIAGNOSIS — G8929 Other chronic pain: Secondary | ICD-10-CM

## 2023-06-16 DIAGNOSIS — M79642 Pain in left hand: Secondary | ICD-10-CM

## 2023-06-16 DIAGNOSIS — M542 Cervicalgia: Secondary | ICD-10-CM

## 2023-06-16 DIAGNOSIS — M25511 Pain in right shoulder: Secondary | ICD-10-CM | POA: Diagnosis not present

## 2023-06-16 MED ORDER — BUPIVACAINE HCL 0.5 % IJ SOLN
3.0000 mL | INTRAMUSCULAR | Status: AC | PRN
  Administered 2023-06-16: 3 mL via INTRA_ARTICULAR

## 2023-06-16 MED ORDER — LIDOCAINE HCL 1 % IJ SOLN
3.0000 mL | INTRAMUSCULAR | Status: AC | PRN
  Administered 2023-06-16: 3 mL

## 2023-06-16 MED ORDER — METHYLPREDNISOLONE ACETATE 40 MG/ML IJ SUSP
40.0000 mg | INTRAMUSCULAR | Status: AC | PRN
  Administered 2023-06-16: 40 mg via INTRA_ARTICULAR

## 2023-06-16 NOTE — Progress Notes (Addendum)
 Office Visit Note   Patient: [name]           Date of Birth: 04-06-1951           MRN: [id number] Visit Date: 06/16/2023              Requested by: Renaye Rakers, MD [address] [place],  [place] [address] PCP: Renaye Rakers, MD   Assessment & Plan: Visit Diagnoses:  1. Pain in left hand   2. Chronic right shoulder pain   3. Neck pain     Plan: [name] is a 73 year old female with right shoulder pain impression is rotator cuff syndrome and advanced left thumb CMC arthritis.  Treatment options were discussed and she will think about [facility] arthroplasty.  For the shoulder she would like to go to outpatient physical therapy.  We also did a subacromial injection today.  Will see her back as needed.  Follow-Up Instructions: No follow-ups on file.   Orders:  Orders Placed This Encounter  Procedures   Large Joint Inj   XR Shoulder Right   XR Cervical Spine 2 or 3 views   XR Hand Complete Left   Ambulatory referral to Physical Therapy   No orders of the defined types were placed in this encounter.     Procedures: Large Joint Inj: R subacromial bursa on 06/16/2023 5:07 PM Indications: pain Details: 22 G needle  Arthrogram: No  Medications: 3 mL lidocaine 1 %; 3 mL bupivacaine 0.5 %; 40 mg methylPREDNISolone acetate 40 MG/ML Outcome: tolerated well, no immediate complications Consent was given by the patient. Patient was prepped and draped in the usual sterile fashion.       Clinical Data: No additional findings.   Subjective: Chief Complaint  Patient presents with   Left Hand - Pain   Neck - Pain   Right Shoulder - Pain    HPI [name] is a 73 year old female here for evaluation of right shoulder and left hand pain.  She has had left hand pain for years.  Tried acupuncture and previous injections provided temporary relief with the most recent injection providing no relief.  For the right shoulder she has had pain for weeks.  Denies any previous  injuries.  Slight occasional pain that radiates to the neck.  Has good range of motion.  Works from home for [employer].  Denies any radicular symptoms. Review of Systems  Constitutional: Negative.   HENT: Negative.    Eyes: Negative.   Respiratory: Negative.    Cardiovascular: Negative.   Endocrine: Negative.   Musculoskeletal: Negative.   Neurological: Negative.   Hematological: Negative.   Psychiatric/Behavioral: Negative.    All other systems reviewed and are negative.    Objective: Vital Signs: There were no vitals taken for this visit.  Physical Exam Vitals and nursing note reviewed.  Constitutional:      Appearance: She is well-developed.  HENT:     Head: Atraumatic.     Nose: Nose normal.  Eyes:     Extraocular Movements: Extraocular movements intact.  Cardiovascular:     Pulses: Normal pulses.  Pulmonary:     Effort: Pulmonary effort is normal.  Abdominal:     Palpations: Abdomen is soft.  Musculoskeletal:     Cervical back: Neck supple.  Skin:    General: Skin is warm.     Capillary Refill: Capillary refill takes less than 2 seconds.  Neurological:     Mental Status: She is alert.  Mental status is at baseline.  Psychiatric:        Behavior: Behavior normal.        Thought Content: Thought content normal.        Judgment: Judgment normal.     Ortho Exam Examination of the right shoulder shows pain with Hawkins and empty can.  There is slight weakness to manual muscle testing of the supraspinatus.  No radicular signs.  Range of motion is normal and functional.  Examination of the left thumb shows pain with CMC grind test. Specialty Comments:  No specialty comments available.  Imaging: XR Hand Complete Left Result Date: 06/16/2023 X-rays of the left hand show advanced thumb CMC arthritis with radial subluxation.  XR Shoulder Right Result Date: 06/16/2023 X-rays of the right shoulder shows degenerative arthritis at the [facility] joint.  Otherwise  unremarkable.  XR Cervical Spine 2 or 3 views Result Date: 06/16/2023 X-rays of the cervical spine show prior ACDF with solid fusion.  Anterior plate with screws without any complications.    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   Genetic testing 04/17/2017   DJD (degenerative joint disease) of knee 03/30/2013   Witnessed episode of apnea 03/23/2013   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   Weakness 11/28/2011   Hypertension 11/28/2011   Hypokalemia 11/28/2011   LUE weakness 11/28/2011   Hypothyroidism 11/28/2011   Thyroid cancer (HCC) 04/28/2010   Past Medical History:  Diagnosis Date   Anemia    "years ago"   Arthritis    "left hand; was in my knees" (09/24/2017)   Breast cancer, right breast (HCC) 2004   Bruises easily    Chronic lower back pain    Ductal carcinoma in situ (DCIS) of left breast 2018   Genetic testing 04/17/2017   STAT Breast panel with reflex to Multi-Cancer panel (83 genes) @ [facility] - No pathogenic mutations detected   History of bronchitis    when she was young   Hypertension    no longer taking medications as of August 2019   Hypothyroidism    takes Synthroid daily   Joint pain    Joint swelling    Personal history of chemotherapy 04/2003   Personal history of radiation therapy 2005   Pneumonia    hx of when she was young   Right elbow tendonitis    Sleep apnea    has a machine but does not use (09/24/2017)   Thyroid cancer (HCC) 2012   Urinary frequency     Family History  Problem Relation Age of Onset   Breast cancer  Mother 47       currently 70   Liver cancer Father    Breast cancer Other        pat grandfather's sister; dx 81s    Past Surgical History:  Procedure Laterality Date   ANTERIOR CERVICAL DECOMP/DISCECTOMY FUSION  2008   APPENDECTOMY  2007   BREAST BIOPSY Right 02/28/2003   malignant   BREAST BIOPSY Left 03/2017   BREAST LUMPECTOMY Right 2004   BREAST RECONSTRUCTION WITH PLACEMENT OF TISSUE EXPANDER AND  FLEX HD (ACELLULAR HYDRATED DERMIS) Bilateral 06/22/2017   Procedure: IMMEDIATE BILATERAL BREAST RECONSTRUCTION WITH PLACEMENT OF TISSUE EXPANDER AND FLEX HD (ACELLULAR HYDRATED DERMIS);  Surgeon: Peggye Form, DO;  Location: [facility];  Service: Plastics;  Laterality: Bilateral;   CESAREAN SECTION  1981   COLONOSCOPY  X 2   INSERTION OF MESH N/A 02/10/2020   Procedure: INSERTION OF MESH;  Surgeon: Griselda Miner, MD;  Location: [facility] OR;  Service: General;  Laterality: N/A;   KNEE ARTHROSCOPY Right 2007   KNEE ARTHROSCOPY  05/02/2011   Procedure: ARTHROSCOPY KNEE;  Surgeon: Javier Docker;  Location: [facility];  Service: Orthopedics;  Laterality: Right;   LATISSIMUS FLAP TO BREAST Right 09/23/2017   Procedure: RIGHT BREAST LATISSIMUS DORSI FLAP RECONSTRUCTION WITH TISSUE EXPANDER;  Surgeon: Peggye Form, DO;  Location: [facility] OR;  Service: Plastics;  Laterality: Right;   LIPOSUCTION WITH LIPOFILLING Bilateral 02/24/2019   Procedure: Bilateral lipofilling of breasts and liposuction of lateral breasts for symmetry;  Surgeon: Peggye Form, DO;  Location: [facility];  Service: Plastics;  Laterality: Bilateral;  2 hours, please   MASTECTOMY W/ SENTINEL NODE BIOPSY Bilateral 06/22/2017   Procedure: LEFT MASTECTOMY WITH SENTINEL LYMPH NODE BIOPSY AND RIGHT PROPHYLACTIC MASTECTOMY;  Surgeon: Griselda Miner, MD;  Location: [facility];  Service: General;  Laterality: Bilateral;   MENISCUS DEBRIDEMENT  05/02/2011   Procedure:  DEBRIDEMENT OF MENISCUS;  Surgeon: Javier Docker;  Location: [facility];  Service: Orthopedics;  Laterality: Right;   PORTA CATH REMOVAL  2005   PORTACATH PLACEMENT  2004   REMOVAL OF BILATERAL TISSUE EXPANDERS WITH PLACEMENT OF BILATERAL BREAST IMPLANTS Bilateral 05/26/2018   Procedure: REMOVAL OF BILATERAL TISSUE EXPANDERS WITH PLACEMENT OF BILATERAL BREAST IMPLANTS;  Surgeon: Peggye Form, DO;  Location: [facility];  Service: Plastics;  Laterality: Bilateral;   REMOVAL OF TISSUE EXPANDER AND PLACEMENT OF IMPLANT Right 12/12/2017   Procedure: REMOVAL OF TISSUE EXPANDER, RIGHT;  Surgeon: Glenna Fellows, MD;  Location: [facility] OR;  Service: Plastics;  Laterality: Right;   THYROIDECTOMY N/A 09/22/2012   Procedure: RIGHT THYROID LOBECTOMY WITH FROZEN SECTION;  Surgeon: Serena Colonel, MD;  Location: [facility] OR;  Service: ENT;  Laterality: N/A;   THYROIDECTOMY Left 10/21/2012   Procedure: COMPLETION OF THYROIDECTOMY;  Surgeon: Serena Colonel, MD;  Location: [facility] OR;  Service: ENT;  Laterality: Left;   TISSUE EXPANDER PLACEMENT Right 03/03/2018   Procedure: TISSUE EXPANDER PLACEMENT;  Surgeon: Peggye Form, DO;  Location: [facility];  Service: Plastics;  Laterality: Right;   TOTAL KNEE ARTHROPLASTY  04/14/2012   Procedure: TOTAL KNEE ARTHROPLASTY;  Surgeon: Loreta Ave, MD;  Location: [facility] OR;  Service: Orthopedics;  Laterality: Right;  RIGHT ARTHROPLASTY KNEE MEDIAL/LATERAL COMPARTMENTS WITH PATELLA RESURFACING   TOTAL KNEE ARTHROPLASTY Left 03/30/2013   Procedure: TOTAL KNEE ARTHROPLASTY;  Surgeon: Loreta Ave, MD;  Location: [facility] OR;  Service: Orthopedics;  Laterality: Left;   VENTRAL HERNIA REPAIR N/A 02/10/2020   Procedure: LAPAROSCOPIC ASSISTED VENTRAL HERNIA REPAIR;  Surgeon: Griselda Miner, MD;  Location: [facility] OR;  Service: General;  Laterality: N/A;   Social History   Occupational History   Not on file  Tobacco Use   Smoking status: Never    Smokeless tobacco: Never  Vaping Use   Vaping status: Never Used  Substance and Sexual Activity   Alcohol use: Yes    Comment: occasional  Drug use: Not Currently   Sexual activity: Not Currently    Birth control/protection: Post-menopausal

## 2023-06-17 ENCOUNTER — Other Ambulatory Visit: Payer: Self-pay

## 2023-06-17 ENCOUNTER — Encounter: Payer: Self-pay | Admitting: Rehabilitative and Restorative Service Providers"

## 2023-06-17 ENCOUNTER — Ambulatory Visit: Payer: Medicare Other | Attending: Orthopaedic Surgery | Admitting: Rehabilitative and Restorative Service Providers"

## 2023-06-17 DIAGNOSIS — M79642 Pain in left hand: Secondary | ICD-10-CM

## 2023-06-17 DIAGNOSIS — G8929 Other chronic pain: Secondary | ICD-10-CM | POA: Insufficient documentation

## 2023-06-17 DIAGNOSIS — M6281 Muscle weakness (generalized): Secondary | ICD-10-CM

## 2023-06-17 DIAGNOSIS — M542 Cervicalgia: Secondary | ICD-10-CM | POA: Diagnosis not present

## 2023-06-17 DIAGNOSIS — M25511 Pain in right shoulder: Secondary | ICD-10-CM | POA: Diagnosis not present

## 2023-06-17 DIAGNOSIS — R252 Cramp and spasm: Secondary | ICD-10-CM | POA: Diagnosis not present

## 2023-06-17 NOTE — Therapy (Signed)
 OUTPATIENT PHYSICAL THERAPY SHOULDER EVALUATION   Patient Name: [name] MRN: [id number] DOB:June 20, 1950, 73 y.o., female Today's Date: 06/17/2023  END OF SESSION:  PT End of Session - 06/17/23 0811     Visit Number 1    Date for PT Re-Evaluation 08/14/23    Authorization Type UMR    Progress Note Due on Visit 10    PT Start Time 0800    PT Stop Time 0840    PT Time Calculation (min) 40 min    Activity Tolerance Patient tolerated treatment well    Behavior During Therapy [facility] for tasks assessed/performed             Past Medical History:  Diagnosis Date   Anemia    "years ago"   Arthritis    "left hand; was in my knees" (09/24/2017)   Breast cancer, right breast (HCC) 2004   Bruises easily    Chronic lower back pain    Ductal carcinoma in situ (DCIS) of left breast 2018   Genetic testing 04/17/2017   STAT Breast panel with reflex to Multi-Cancer panel (83 genes) @ [facility] - No pathogenic mutations detected   History of bronchitis    when she was young   Hypertension    no longer taking medications as of August 2019   Hypothyroidism    takes Synthroid daily   Joint pain    Joint swelling    Personal history of chemotherapy 04/2003   Personal history of radiation therapy 2005   Pneumonia    hx of when she was young   Right elbow tendonitis    Sleep apnea    has a machine but does not use (09/24/2017)   Thyroid cancer (HCC) 2012   Urinary frequency    Past Surgical History:  Procedure Laterality Date   ANTERIOR CERVICAL DECOMP/DISCECTOMY FUSION  2008   APPENDECTOMY  2007   BREAST BIOPSY Right 02/28/2003   malignant   BREAST BIOPSY Left 03/2017   BREAST LUMPECTOMY Right 2004   BREAST RECONSTRUCTION WITH PLACEMENT OF TISSUE EXPANDER AND FLEX HD (ACELLULAR HYDRATED DERMIS) Bilateral 06/22/2017   Procedure: IMMEDIATE BILATERAL BREAST RECONSTRUCTION WITH PLACEMENT OF TISSUE EXPANDER AND FLEX HD (ACELLULAR HYDRATED DERMIS);  Surgeon: Peggye Form,  DO;  Location: [facility];  Service: Plastics;  Laterality: Bilateral;   CESAREAN SECTION  1981   COLONOSCOPY  X 2   INSERTION OF MESH N/A 02/10/2020   Procedure: INSERTION OF MESH;  Surgeon: Griselda Miner, MD;  Location: [facility] OR;  Service: General;  Laterality: N/A;   KNEE ARTHROSCOPY Right 2007   KNEE ARTHROSCOPY  05/02/2011   Procedure: ARTHROSCOPY KNEE;  Surgeon: Javier Docker;  Location: [facility];  Service: Orthopedics;  Laterality: Right;   LATISSIMUS FLAP TO BREAST Right 09/23/2017   Procedure: RIGHT BREAST LATISSIMUS DORSI FLAP RECONSTRUCTION WITH TISSUE EXPANDER;  Surgeon: Peggye Form, DO;  Location: [facility] OR;  Service: Plastics;  Laterality: Right;   LIPOSUCTION WITH LIPOFILLING Bilateral 02/24/2019   Procedure: Bilateral lipofilling of breasts and liposuction of lateral breasts for symmetry;  Surgeon: Peggye Form, DO;  Location: [facility];  Service: Plastics;  Laterality: Bilateral;  2 hours, please   MASTECTOMY W/ SENTINEL NODE BIOPSY Bilateral 06/22/2017   Procedure: LEFT MASTECTOMY WITH SENTINEL LYMPH NODE BIOPSY AND RIGHT PROPHYLACTIC MASTECTOMY;  Surgeon: Griselda Miner, MD;  Location: [facility];  Service: General;  Laterality: Bilateral;   MENISCUS DEBRIDEMENT  05/02/2011   Procedure: DEBRIDEMENT OF MENISCUS;  Surgeon: Javier Docker;  Location: [facility];  Service: Orthopedics;  Laterality: Right;   PORTA CATH REMOVAL  2005   PORTACATH PLACEMENT  2004   REMOVAL OF BILATERAL TISSUE EXPANDERS WITH PLACEMENT OF BILATERAL BREAST IMPLANTS Bilateral 05/26/2018   Procedure: REMOVAL OF BILATERAL TISSUE EXPANDERS WITH PLACEMENT OF BILATERAL BREAST IMPLANTS;  Surgeon: Peggye Form, DO;  Location: [facility];  Service: Plastics;  Laterality: Bilateral;   REMOVAL OF TISSUE EXPANDER AND PLACEMENT OF IMPLANT Right 12/12/2017   Procedure: REMOVAL OF TISSUE EXPANDER, RIGHT;   Surgeon: Glenna Fellows, MD;  Location: [facility] OR;  Service: Plastics;  Laterality: Right;   THYROIDECTOMY N/A 09/22/2012   Procedure: RIGHT THYROID LOBECTOMY WITH FROZEN SECTION;  Surgeon: Serena Colonel, MD;  Location: [facility] OR;  Service: ENT;  Laterality: N/A;   THYROIDECTOMY Left 10/21/2012   Procedure: COMPLETION OF THYROIDECTOMY;  Surgeon: Serena Colonel, MD;  Location: [facility] OR;  Service: ENT;  Laterality: Left;   TISSUE EXPANDER PLACEMENT Right 03/03/2018   Procedure: TISSUE EXPANDER PLACEMENT;  Surgeon: Peggye Form, DO;  Location: [facility];  Service: Plastics;  Laterality: Right;   TOTAL KNEE ARTHROPLASTY  04/14/2012   Procedure: TOTAL KNEE ARTHROPLASTY;  Surgeon: Loreta Ave, MD;  Location: [facility] OR;  Service: Orthopedics;  Laterality: Right;  RIGHT ARTHROPLASTY KNEE MEDIAL/LATERAL COMPARTMENTS WITH PATELLA RESURFACING   TOTAL KNEE ARTHROPLASTY Left 03/30/2013   Procedure: TOTAL KNEE ARTHROPLASTY;  Surgeon: Loreta Ave, MD;  Location: [facility] OR;  Service: Orthopedics;  Laterality: Left;   VENTRAL HERNIA REPAIR N/A 02/10/2020   Procedure: LAPAROSCOPIC ASSISTED VENTRAL HERNIA REPAIR;  Surgeon: Griselda Miner, MD;  Location: [facility] OR;  Service: General;  Laterality: N/A;   Patient Active Problem List   Diagnosis Date Noted   Breast asymmetry following reconstructive surgery 05/22/2023   Torn earlobe, left, initial encounter 12/17/2018   S/P breast reconstruction, bilateral 03/16/2018   History of breast cancer in female 01/27/2018   History of reconstruction of both breasts 01/27/2018   Acquired absence of right breast 09/23/2017   Breast cancer (HCC) 06/22/2017   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   Genetic testing 04/17/2017   DJD (degenerative joint disease) of knee 03/30/2013   Witnessed episode of apnea 03/23/2013   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   Weakness 11/28/2011   Hypertension 11/28/2011   Hypokalemia 11/28/2011   LUE weakness 11/28/2011    Hypothyroidism 11/28/2011   Thyroid cancer (HCC) 04/28/2010    PCP: Renaye Rakers, MD  REFERRING PROVIDER: Tarry Kos, MD  REFERRING DIAG: [id number] (ICD-10-CM) - Pain in left hand M25.511,G89.29 (ICD-10-CM) - Chronic right shoulder pain M54.2 (ICD-10-CM) - Neck pain  THERAPY DIAG:  Right shoulder pain, unspecified chronicity - Plan: PT plan of care cert/re-cert  Cervicalgia - Plan: PT plan of care cert/re-cert  Muscle weakness (generalized) - Plan: PT plan of care cert/re-cert  Cramp and spasm - Plan: PT plan of care cert/re-cert  Pain in left hand - Plan: PT plan of care cert/re-cert  Rationale for Evaluation and Treatment: Rehabilitation  ONSET DATE: Left thumb pain for years, Right shoulder pain for at least 2 months  SUBJECTIVE:  SUBJECTIVE STATEMENT: Patient states that she has had left thumb pain for years.  She is considering a surgical intervention for the thumb pain.  Patient reports that her right shoulder/cervical pain started approximately 2-3 months ago.  States that she is right hand dominant, so this has been more bothersome for her than the left thumb.  Hand dominance: Right  PERTINENT HISTORY: Right side Breast Cancer s/p bilateral mastectomy, ACDF with solid fusion 2008, right TKA 2013, left TKA 2014  PAIN:  Are you having pain? Yes: NPRS scale: 3-8/10 Pain location: left thumb, right shoulder, cervical Pain description: sore, sometimes shooting Aggravating factors: certain movements Relieving factors: sometimes Motrin will improve, heat  PRECAUTIONS: Other: s/p bilateral mastectomy  RED FLAGS: None   WEIGHT BEARING RESTRICTIONS: No  FALLS:  Has patient fallen in last 6 months? 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  NEXT MD VISIT: Will follow up with Dr Roda Shutters  OBJECTIVE:  Note: Objective measures were completed at Evaluation unless otherwise noted.  DIAGNOSTIC FINDINGS:  Cervical Radiograph on 06/16/23: X-rays of the cervical spine show prior ACDF with solid fusion.  Anterior  plate with screws without any complications.   Right shoulder radiograph on 06/16/23: X-rays of the right shoulder shows degenerative arthritis at the [facility] joint.   Otherwise unremarkable.   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   PATIENT SURVEYS:  Eval:  UEFS 28 / 80 = 35.0 %  COGNITION: Overall cognitive status: Within functional limits for tasks assessed     SENSATION: Patient states that she has had some numbness and tingling down her right arm.  POSTURE: Slight forward head and rounded shoulders  UPPER EXTREMITY ROM:   Active ROM Right eval Left eval  Shoulder flexion 123 147  Shoulder extension 45 42  Shoulder abduction 124 144  Shoulder adduction    Shoulder internal rotation    Shoulder external rotation With pain With pain  Elbow flexion    Elbow extension    Wrist flexion    Wrist extension    Wrist ulnar deviation    Wrist radial deviation    Wrist pronation    Wrist supination    (Blank rows = not tested)  UPPER EXTREMITY MMT:  MMT Right eval Left eval  Shoulder flexion 4 5  Shoulder extension    Shoulder abduction 4 5  Shoulder adduction    Shoulder internal rotation 4 4  Shoulder external rotation 4 4  Middle trapezius    Lower trapezius    Elbow flexion    Elbow  extension    Wrist flexion    Wrist extension    Wrist ulnar deviation    Wrist radial deviation    Wrist pronation    Wrist supination    Grip strength (lbs) 50 40  (Blank rows = not tested)  SHOULDER SPECIAL TESTS: Impingement tests: Hawkins/Kennedy impingement test: positive  Rotator cuff assessment: Empty can test: positive    PALPATION:  Some tenderness to palpation along right shoulder  TODAY'S TREATMENT  DATE: 06/17/2023 Issued and reviewed HEP Discussed role of PT   PATIENT EDUCATION: Education details: Issued HEP Person educated: Patient Education method: Explanation, Demonstration, and Handouts Education comprehension: verbalized understanding and returned demonstration  HOME EXERCISE PROGRAM: Access Code: [email or web address] URL: [email or web address] Date: 06/17/2023 Prepared by: Clydie Braun Evangelia Whitaker  Exercises - Seated Scapular Retraction  - 1 x daily - 7 x weekly - 2 sets - 10 reps - Seated Cervical Retraction  - 1 x daily - 7 x weekly - 2 sets - 10 reps - Seated Upper Trapezius Stretch  - 1 x daily - 7 x weekly - 2 reps - 20 sec hold - Seated Levator Scapulae Stretch  - 1 x daily - 7 x weekly - 2 reps - 20 sec hold  ASSESSMENT:  CLINICAL IMPRESSION: Patient is a 73 y.o. female who was seen today for physical therapy evaluation and treatment for right shoulder, cervical, and left thumb pain. Patient presents to PT reporting a long history of left thumb pain, reporting that she will likely be having a surgical repair.  Patient states that right shoulder and cervical pain has been worsening over the past couple of months.  Patient wanted to try PT to assist with decreased pain, as she has had success with PT in the past.  Patient presents with muscle weakness, decreased right shoulder ROM, increased pain, and difficulty performing functional  tasks.  Patient is quite active and enjoys traveling with plans to go on a cruise later this year.  Patient would benefit from skilled PT to progress towards goal related activities to allow her to perform functional tasks without increased pain or difficulty.  OBJECTIVE IMPAIRMENTS: decreased activity tolerance, decreased ROM, decreased strength, increased muscle spasms, impaired UE functional use, postural dysfunction, and pain.   ACTIVITY LIMITATIONS: carrying, lifting, and sleeping  PARTICIPATION LIMITATIONS: meal prep, cleaning, driving, and community activity  PERSONAL FACTORS: Time since onset of injury/illness/exacerbation and 3+ comorbidities: OA, Hx of breast cancer with bilateral mastectomy, Bilateral TKA  are also affecting patient's functional outcome.   REHAB POTENTIAL: Good  CLINICAL DECISION MAKING: Evolving/moderate complexity  EVALUATION COMPLEXITY: Moderate   GOALS: Goals reviewed with patient? Yes  SHORT TERM GOALS: Target date: 07/10/2023  Patient will be independent with initial HEP. Baseline: Goal status: INITIAL  2.  Patient will increase right shoulder A/ROM to [facility] to allow her to put items away in overhead cabinets and wash her back. Baseline:  Goal status: INITIAL   LONG TERM GOALS: Target date: 08/14/2023  Patient will be independent with advanced HEP to allow for self progression post discharge. Baseline:  Goal status: INITIAL  2.  Patient will improve Upper Extremity Functional Scale to no greater than 25% to demonstrate improvements with functional tasks. Baseline: 35% Goal status: INITIAL  3.  Patient will increase upper extremity strength to [facility] to allow her to lift heavier items, such as a suitcase without difficulty. Baseline:  Goal status: INITIAL  4.  Patient will report ability to return to driving and turning to look over her shoulder without increased pain. Baseline:  Goal status: INITIAL  5.  Patient will report ability to  complete all work related tasks with 70% decreased pain by end of shift. Baseline:  Goal status: INITIAL   PLAN:  PT FREQUENCY: 2x/week  PT DURATION: 8 weeks  PLANNED INTERVENTIONS: 97164- PT Re-evaluation, 97110-Therapeutic exercises, 97530- Therapeutic activity, [id number]- Neuromuscular re-education, 97535- Self Care, 16109- Manual therapy, [id number]- Aquatic Therapy, U0454- Electrical stimulation (unattended), [id number]-  Electrical stimulation (manual), [id number]- Vasopneumatic device, [id number]- Ultrasound, [id number]- Traction (mechanical), [id number]- Ionotophoresis 4mg /ml Dexamethasone, Patient/Family education, Taping, Dry Needling, Joint mobilization, Joint manipulation, Spinal manipulation, Spinal mobilization, Scar mobilization, Cryotherapy, and Moist heat  PLAN FOR NEXT SESSION: Assess and progress HEP as indicated, strengthening, flexibility, manual/dry needling as indicated    Reather Laurence, PT, DPT 06/17/23, 10:09 AM  [facility] [address], [address] [place], [place] [address] Phone # [phone] Fax [phone]

## 2023-06-19 ENCOUNTER — Other Ambulatory Visit: Payer: Self-pay | Admitting: Endocrinology

## 2023-06-19 DIAGNOSIS — C73 Malignant neoplasm of thyroid gland: Secondary | ICD-10-CM

## 2023-06-22 DIAGNOSIS — E785 Hyperlipidemia, unspecified: Secondary | ICD-10-CM | POA: Diagnosis not present

## 2023-06-22 DIAGNOSIS — I10 Essential (primary) hypertension: Secondary | ICD-10-CM | POA: Diagnosis not present

## 2023-06-24 ENCOUNTER — Ambulatory Visit: Payer: Medicare Other | Admitting: Physical Therapy

## 2023-06-24 DIAGNOSIS — M6281 Muscle weakness (generalized): Secondary | ICD-10-CM

## 2023-06-24 DIAGNOSIS — M25511 Pain in right shoulder: Secondary | ICD-10-CM

## 2023-06-24 DIAGNOSIS — M79642 Pain in left hand: Secondary | ICD-10-CM | POA: Diagnosis not present

## 2023-06-24 DIAGNOSIS — R252 Cramp and spasm: Secondary | ICD-10-CM | POA: Diagnosis not present

## 2023-06-24 DIAGNOSIS — M542 Cervicalgia: Secondary | ICD-10-CM

## 2023-06-24 DIAGNOSIS — G8929 Other chronic pain: Secondary | ICD-10-CM | POA: Diagnosis not present

## 2023-06-24 NOTE — Patient Instructions (Signed)

## 2023-06-24 NOTE — Therapy (Signed)
 OUTPATIENT PHYSICAL THERAPY SHOULDER PROGRESS NOTE  Patient Name: [name] MRN: [id number] DOB:10-29-1950, 73 y.o., female Today's Date: 06/24/2023  END OF SESSION:  PT End of Session - 06/24/23 0837     Visit Number 2    Date for PT Re-Evaluation 08/14/23    Authorization Type U[employer]    Progress Note Due on Visit 10    PT Start Time [phone]    PT Stop Time 0925    PT Time Calculation (min) 42 min    Activity Tolerance Patient tolerated treatment well             Past Medical History:  Diagnosis Date   Anemia    "years ago"   Arthritis    "left hand; was in my knees" (09/24/2017)   Breast cancer, right breast (HCC) 2004   Bruises easily    Chronic lower back pain    Ductal carcinoma in situ (DCIS) of left breast 2018   Genetic testing 04/17/2017   STAT Breast panel with reflex to Multi-Cancer panel (83 genes) @ [facility] - No pathogenic mutations detected   History of bronchitis    when she was young   Hypertension    no longer taking medications as of August 2019   Hypothyroidism    takes Synthroid daily   Joint pain    Joint swelling    Personal history of chemotherapy 04/2003   Personal history of radiation therapy 2005   Pneumonia    hx of when she was young   Right elbow tendonitis    Sleep apnea    has a machine but does not use (09/24/2017)   Thyroid cancer (HCC) 2012   Urinary frequency    Past Surgical History:  Procedure Laterality Date   ANTERIOR CERVICAL DECOMP/DISCECTOMY FUSION  2008   APPENDECTOMY  2007   BREAST BIOPSY Right 02/28/2003   malignant   BREAST BIOPSY Left 03/2017   BREAST LUMPECTOMY Right 2004   BREAST RECONSTRUCTION WITH PLACEMENT OF TISSUE EXPANDER AND FLEX HD (ACELLULAR HYDRATED DERMIS) Bilateral 06/22/2017   Procedure: IMMEDIATE BILATERAL BREAST RECONSTRUCTION WITH PLACEMENT OF TISSUE EXPANDER AND FLEX HD (ACELLULAR HYDRATED DERMIS);  Surgeon: Peggye Form, DO;  Location: [facility];  Service: Plastics;   Laterality: Bilateral;   CESAREAN SECTION  1981   COLONOSCOPY  X 2   INSERTION OF MESH N/A 02/10/2020   Procedure: INSERTION OF MESH;  Surgeon: Griselda Miner, MD;  Location: [facility] OR;  Service: General;  Laterality: N/A;   KNEE ARTHROSCOPY Right 2007   KNEE ARTHROSCOPY  05/02/2011   Procedure: ARTHROSCOPY KNEE;  Surgeon: Javier Docker;  Location: [facility];  Service: Orthopedics;  Laterality: Right;   LATISSIMUS FLAP TO BREAST Right 09/23/2017   Procedure: RIGHT BREAST LATISSIMUS DORSI FLAP RECONSTRUCTION WITH TISSUE EXPANDER;  Surgeon: Peggye Form, DO;  Location: [facility] OR;  Service: Plastics;  Laterality: Right;   LIPOSUCTION WITH LIPOFILLING Bilateral 02/24/2019   Procedure: Bilateral lipofilling of breasts and liposuction of lateral breasts for symmetry;  Surgeon: Peggye Form, DO;  Location: [facility];  Service: Plastics;  Laterality: Bilateral;  2 hours, please   MASTECTOMY W/ SENTINEL NODE BIOPSY Bilateral 06/22/2017   Procedure: LEFT MASTECTOMY WITH SENTINEL LYMPH NODE BIOPSY AND RIGHT PROPHYLACTIC MASTECTOMY;  Surgeon: Griselda Miner, MD;  Location: [facility];  Service: General;  Laterality: Bilateral;   MENISCUS DEBRIDEMENT  05/02/2011   Procedure: DEBRIDEMENT OF MENISCUS;  Surgeon: Tinnie Gens  C Beane;  Location: [facility];  Service: Orthopedics;  Laterality: Right;   PORTA CATH REMOVAL  2005   PORTACATH PLACEMENT  2004   REMOVAL OF BILATERAL TISSUE EXPANDERS WITH PLACEMENT OF BILATERAL BREAST IMPLANTS Bilateral 05/26/2018   Procedure: REMOVAL OF BILATERAL TISSUE EXPANDERS WITH PLACEMENT OF BILATERAL BREAST IMPLANTS;  Surgeon: Peggye Form, DO;  Location: [facility];  Service: Plastics;  Laterality: Bilateral;   REMOVAL OF TISSUE EXPANDER AND PLACEMENT OF IMPLANT Right 12/12/2017   Procedure: REMOVAL OF TISSUE EXPANDER, RIGHT;  Surgeon: Glenna Fellows, MD;  Location: [facility] OR;  Service: Plastics;   Laterality: Right;   THYROIDECTOMY N/A 09/22/2012   Procedure: RIGHT THYROID LOBECTOMY WITH FROZEN SECTION;  Surgeon: Serena Colonel, MD;  Location: [facility] OR;  Service: ENT;  Laterality: N/A;   THYROIDECTOMY Left 10/21/2012   Procedure: COMPLETION OF THYROIDECTOMY;  Surgeon: Serena Colonel, MD;  Location: [facility] OR;  Service: ENT;  Laterality: Left;   TISSUE EXPANDER PLACEMENT Right 03/03/2018   Procedure: TISSUE EXPANDER PLACEMENT;  Surgeon: Peggye Form, DO;  Location: [facility];  Service: Plastics;  Laterality: Right;   TOTAL KNEE ARTHROPLASTY  04/14/2012   Procedure: TOTAL KNEE ARTHROPLASTY;  Surgeon: Loreta Ave, MD;  Location: [facility] OR;  Service: Orthopedics;  Laterality: Right;  RIGHT ARTHROPLASTY KNEE MEDIAL/LATERAL COMPARTMENTS WITH PATELLA RESURFACING   TOTAL KNEE ARTHROPLASTY Left 03/30/2013   Procedure: TOTAL KNEE ARTHROPLASTY;  Surgeon: Loreta Ave, MD;  Location: [facility] OR;  Service: Orthopedics;  Laterality: Left;   VENTRAL HERNIA REPAIR N/A 02/10/2020   Procedure: LAPAROSCOPIC ASSISTED VENTRAL HERNIA REPAIR;  Surgeon: Griselda Miner, MD;  Location: [facility] OR;  Service: General;  Laterality: N/A;   Patient Active Problem List   Diagnosis Date Noted   Breast asymmetry following reconstructive surgery 05/22/2023   Torn earlobe, left, initial encounter 12/17/2018   S/P breast reconstruction, bilateral 03/16/2018   History of breast cancer in female 01/27/2018   History of reconstruction of both breasts 01/27/2018   Acquired absence of right breast 09/23/2017   Breast cancer (HCC) 06/22/2017   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   Genetic testing 04/17/2017   DJD (degenerative joint disease) of knee 03/30/2013   Witnessed episode of apnea 03/23/2013   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   Weakness 11/28/2011   Hypertension 11/28/2011   Hypokalemia 11/28/2011   LUE weakness 11/28/2011   Hypothyroidism 11/28/2011   Thyroid cancer (HCC) 04/28/2010    PCP:  Renaye Rakers, MD  REFERRING PROVIDER: Tarry Kos, MD  REFERRING DIAG: [phone] (ICD-10-CM) - Pain in left hand M25.511,G89.29 (ICD-10-CM) - Chronic right shoulder pain M54.2 (ICD-10-CM) - Neck pain  THERAPY DIAG:  Right shoulder pain, unspecified chronicity  Cervicalgia  Muscle weakness (generalized)  Cramp and spasm  Pain in left hand  Rationale for Evaluation and Treatment: Rehabilitation  ONSET DATE: Left thumb pain for years, Right shoulder pain for at least 2 months  SUBJECTIVE:  SUBJECTIVE STATEMENT: Cortisone shot really helped my right shoulder (anterior).  Bone on bone in left thumb. Yesterday it really bothered me.  Having thyroid biopsy 3/1    Eval:Patient states that she has had left thumb pain for years.  She is considering a surgical intervention for the thumb pain.  Patient reports that her right shoulder/cervical pain started approximately 2-3 months ago.  States that she is right hand dominant, so this has been more bothersome for her than the left thumb.  Hand dominance: Right  PERTINENT HISTORY: Right side Breast Cancer s/p bilateral mastectomy, ACDF with solid fusion 2008, right TKA 2013, left TKA 2014  PAIN:  Are you having pain? Yes: NPRS scale: 3/10 Pain location: left thumb, right shoulder, cervical Pain description: sore, sometimes shooting Aggravating factors: certain movements Relieving factors: sometimes Motrin will improve, heat  PRECAUTIONS: Other: s/p bilateral mastectomy  RED FLAGS: None   WEIGHT BEARING RESTRICTIONS: No  FALLS:  Has patient fallen in last 6 months? No  LIVING ENVIRONMENT: Lives with: lives alone Lives in: House/apartment Stairs:  2 story home Has following equipment at home: shower chair  OCCUPATION: Works at home for [employer]  PLOF: Independent, Vocation/Vocational requirements: requires computer/ipad and phone work, and Leisure: traveling/cruising  PATIENT GOALS:  To get some relief and walk away with some exercises that I can continue to do independently.  NEXT MD VISIT: Will follow up with Dr Roda Shutters  OBJECTIVE:  Note: Objective measures were completed at Evaluation unless otherwise noted.  DIAGNOSTIC FINDINGS:  Cervical Radiograph on 06/16/23: X-rays of the cervical spine show prior ACDF with solid fusion.  Anterior  plate with screws without any complications.   Right shoulder radiograph on 06/16/23: X-rays of the right shoulder shows degenerative arthritis at the [facility] joint.   Otherwise unremarkable.   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   PATIENT SURVEYS:  Eval:  UEFS 28 / 80 = 35.0 %  COGNITION: Overall cognitive status: Within functional limits for tasks assessed     SENSATION: Patient states that she has had some numbness and tingling down her right arm.  POSTURE: Slight forward head and rounded shoulders  UPPER EXTREMITY ROM:   Active ROM Right eval Left eval  Shoulder flexion 123 147  Shoulder extension 45 42  Shoulder abduction 124 144  Shoulder adduction    Shoulder internal rotation    Shoulder external rotation With pain With pain  Elbow flexion    Elbow extension    Wrist flexion    Wrist extension    Wrist ulnar deviation    Wrist radial deviation    Wrist pronation    Wrist supination    (Blank rows = not tested)  UPPER EXTREMITY MMT:  MMT Right eval Left eval  Shoulder flexion 4 5  Shoulder extension    Shoulder abduction 4 5  Shoulder adduction    Shoulder internal rotation 4 4  Shoulder external rotation 4 4  Middle trapezius    Lower trapezius    Elbow flexion    Elbow extension    Wrist flexion    Wrist extension    Wrist ulnar deviation    Wrist radial  deviation    Wrist pronation    Wrist supination    Grip strength (lbs) 50 40  (Blank rows = not tested)  SHOULDER SPECIAL TESTS: Impingement tests: Hawkins/Kennedy impingement test: positive  Rotator cuff assessment: Empty can  test: positive    PALPATION:  Some tenderness to palpation along right shoulder                                                                                                                             TODAY'S TREATMENT  DATE: 06/24/2023 Supine clasped hands shoulder flexion 10x (added to HEP) Supine dowel external rotation (or clasped hands to top of the head) (added to HEP) Supine dowel horizontal abduction (added to HEP) Thumb circumduction (added to HEP) Seated table flexion slides (added to HEP) Trigger Point Dry Needling Initial Treatment: Pt instructed on Dry Needling rational, procedures, and possible side effects. Pt instructed to expect mild to moderate muscle soreness later in the day and/or into the next day.  Pt instructed in methods to reduce muscle soreness. Pt instructed to continue prescribed HEP. Patient verbalized understanding of these instructions and education.  Patient Verbal Consent Given: Yes Education Handout Provided: Yes Muscles Treated: right upper trap (posterior and anterior approach); left abductor pollicis, flexor pollicis brevis, adductor pollicis Electrical Stimulation Performed: No Treatment Response/Outcome: decreased tender points and improved soft tissue mobility on all treated muscles      DATE: 06/17/2023 Issued and reviewed HEP Discussed role of PT   PATIENT EDUCATION: Education details: Issued HEP Person educated: Patient Education method: Explanation, Facilities manager, and Handouts Education comprehension: verbalized understanding and returned demonstration  HOME EXERCISE PROGRAM: Access Code: [email or web address] URL: [email or web address] Date: 06/24/2023 Prepared by: Lavinia Sharps  Exercises -  Seated Scapular Retraction  - 1 x daily - 7 x weekly - 2 sets - 10 reps - Seated Cervical Retraction  - 1 x daily - 7 x weekly - 2 sets - 10 reps - Seated Upper Trapezius Stretch  - 1 x daily - 7 x weekly - 2 reps - 20 sec hold - Seated Levator Scapulae Stretch  - 1 x daily - 7 x weekly - 2 reps - 20 sec hold - Supine Shoulder Flexion AAROM with Hands Clasped  - 1 x daily - 7 x weekly - 1 sets - 10 reps - Supine Shoulder External Rotation with Dowel  - 1 x daily - 7 x weekly - 1 sets - 10 reps - Supine Shoulder Horizontal Abduction Adduction AAROM with Dowel  - 1 x daily - 7 x weekly - 1 sets - 10 reps - Seated Thumb Circumduction AROM  - 1 x daily - 7 x weekly - 2 sets - 5 reps - Seated Shoulder Flexion Towel Slide at Table Top  - 1 x daily - 7 x weekly - 1 sets - 10 reps  ASSESSMENT:  CLINICAL IMPRESSION: The patient had a positive initial response to DN with much improved soft tissue mobility and decreased size and number of tender points.  Therapist monitoring response and educating patient on what to expect following DN and how to optimize benefit with specific exercise.  Anticipate pain intensity and mobility will continue to improve over the  next few days.   The patient was encouraged in regular performance of HEP post DN including soft tissue lengthening and strengthening exercises to enhance long term benefit.    OBJECTIVE IMPAIRMENTS: decreased activity tolerance, decreased ROM, decreased strength, increased muscle spasms, impaired UE functional use, postural dysfunction, and pain.   ACTIVITY LIMITATIONS: carrying, lifting, and sleeping  PARTICIPATION LIMITATIONS: meal prep, cleaning, driving, and community activity  PERSONAL FACTORS: Time since onset of injury/illness/exacerbation and 3+ comorbidities: OA, Hx of breast cancer with bilateral mastectomy, Bilateral TKA  are also affecting patient's functional outcome.   REHAB POTENTIAL: Good  CLINICAL DECISION MAKING:  Evolving/moderate complexity  EVALUATION COMPLEXITY: Moderate   GOALS: Goals reviewed with patient? Yes  SHORT TERM GOALS: Target date: 07/10/2023  Patient will be independent with initial HEP. Baseline: Goal status: INITIAL  2.  Patient will increase right shoulder A/ROM to [place] to allow her to put items away in overhead cabinets and wash her back. Baseline:  Goal status: INITIAL   LONG TERM GOALS: Target date: 08/14/2023  Patient will be independent with advanced HEP to allow for self progression post discharge. Baseline:  Goal status: INITIAL  2.  Patient will improve Upper Extremity Functional Scale to no greater than 25% to demonstrate improvements with functional tasks. Baseline: 35% Goal status: INITIAL  3.  Patient will increase upper extremity strength to [facility] to allow her to lift heavier items, such as a suitcase without difficulty. Baseline:  Goal status: INITIAL  4.  Patient will report ability to return to driving and turning to look over her shoulder without increased pain. Baseline:  Goal status: INITIAL  5.  Patient will report ability to complete all work related tasks with 70% decreased pain by end of shift. Baseline:  Goal status: INITIAL   PLAN:  PT FREQUENCY: 2x/week  PT DURATION: 8 weeks  PLANNED INTERVENTIONS: 97164- PT Re-evaluation, 97110-Therapeutic exercises, 97530- Therapeutic activity, 97112- Neuromuscular re-education, 97535- Self Care, 78295- Manual therapy, [id number]- Aquatic Therapy, A2130- Electrical stimulation (unattended), [id number]- Electrical stimulation (manual), [id number]- Vasopneumatic device, [id number]- Ultrasound, [id number]- Traction (mechanical), [id number]- Ionotophoresis 4mg /ml Dexamethasone, Patient/Family education, Taping, Dry Needling, Joint mobilization, Joint manipulation, Spinal manipulation, Spinal mobilization, Scar mobilization, Cryotherapy, and Moist heat  PLAN FOR NEXT SESSION: assess response to DN#1 to right upper traps and left thumb  muscles; Assess and progress HEP as indicated, strengthening, flexibility,    Lavinia Sharps, PT 06/24/23 6:58 PM Phone: [phone] Fax: [phone]  [facility] [address], [address] [place], [place] [address] Phone # [phone] Fax [phone]

## 2023-06-25 ENCOUNTER — Encounter: Payer: Self-pay | Admitting: Rehabilitative and Restorative Service Providers"

## 2023-06-25 DIAGNOSIS — M1812 Unilateral primary osteoarthritis of first carpometacarpal joint, left hand: Secondary | ICD-10-CM | POA: Diagnosis not present

## 2023-07-01 ENCOUNTER — Ambulatory Visit: Payer: Medicare Other | Attending: Orthopaedic Surgery | Admitting: Rehabilitative and Restorative Service Providers"

## 2023-07-01 ENCOUNTER — Encounter: Payer: Self-pay | Admitting: Rehabilitative and Restorative Service Providers"

## 2023-07-01 DIAGNOSIS — M79642 Pain in left hand: Secondary | ICD-10-CM | POA: Insufficient documentation

## 2023-07-01 DIAGNOSIS — M25511 Pain in right shoulder: Secondary | ICD-10-CM | POA: Insufficient documentation

## 2023-07-01 DIAGNOSIS — R252 Cramp and spasm: Secondary | ICD-10-CM | POA: Insufficient documentation

## 2023-07-01 DIAGNOSIS — M542 Cervicalgia: Secondary | ICD-10-CM | POA: Diagnosis not present

## 2023-07-01 DIAGNOSIS — M6281 Muscle weakness (generalized): Secondary | ICD-10-CM | POA: Diagnosis not present

## 2023-07-01 NOTE — Therapy (Signed)
 OUTPATIENT PHYSICAL THERAPY SHOULDER PROGRESS NOTE  Patient Name: [name] MRN: [id number] DOB:1950/07/03, 73 y.o., female Today's Date: 07/01/2023  END OF SESSION:  PT End of Session - 07/01/23 0852     Visit Number 3    Date for PT Re-Evaluation 08/14/23    Authorization Type UMR    Progress Note Due on Visit 10    PT Start Time 0845    PT Stop Time 0925    PT Time Calculation (min) 40 min    Activity Tolerance Patient tolerated treatment well    Behavior During Therapy [facility] for tasks assessed/performed             Past Medical History:  Diagnosis Date   Anemia    "years ago"   Arthritis    "left hand; was in my knees" (09/24/2017)   Breast cancer, right breast (HCC) 2004   Bruises easily    Chronic lower back pain    Ductal carcinoma in situ (DCIS) of left breast 2018   Genetic testing 04/17/2017   STAT Breast panel with reflex to Multi-Cancer panel (83 genes) @ [facility] - No pathogenic mutations detected   History of bronchitis    when she was young   Hypertension    no longer taking medications as of August 2019   Hypothyroidism    takes Synthroid daily   Joint pain    Joint swelling    Personal history of chemotherapy 04/2003   Personal history of radiation therapy 2005   Pneumonia    hx of when she was young   Right elbow tendonitis    Sleep apnea    has a machine but does not use (09/24/2017)   Thyroid cancer (HCC) 2012   Urinary frequency    Past Surgical History:  Procedure Laterality Date   ANTERIOR CERVICAL DECOMP/DISCECTOMY FUSION  2008   APPENDECTOMY  2007   BREAST BIOPSY Right 02/28/2003   malignant   BREAST BIOPSY Left 03/2017   BREAST LUMPECTOMY Right 2004   BREAST RECONSTRUCTION WITH PLACEMENT OF TISSUE EXPANDER AND FLEX HD (ACELLULAR HYDRATED DERMIS) Bilateral 06/22/2017   Procedure: IMMEDIATE BILATERAL BREAST RECONSTRUCTION WITH PLACEMENT OF TISSUE EXPANDER AND FLEX HD (ACELLULAR HYDRATED DERMIS);  Surgeon: Peggye Form,  DO;  Location: [facility];  Service: Plastics;  Laterality: Bilateral;   CESAREAN SECTION  1981   COLONOSCOPY  X 2   INSERTION OF MESH N/A 02/10/2020   Procedure: INSERTION OF MESH;  Surgeon: Griselda Miner, MD;  Location: [facility] OR;  Service: General;  Laterality: N/A;   KNEE ARTHROSCOPY Right 2007   KNEE ARTHROSCOPY  05/02/2011   Procedure: ARTHROSCOPY KNEE;  Surgeon: Javier Docker;  Location: [facility];  Service: Orthopedics;  Laterality: Right;   LATISSIMUS FLAP TO BREAST Right 09/23/2017   Procedure: RIGHT BREAST LATISSIMUS DORSI FLAP RECONSTRUCTION WITH TISSUE EXPANDER;  Surgeon: Peggye Form, DO;  Location: [facility] OR;  Service: Plastics;  Laterality: Right;   LIPOSUCTION WITH LIPOFILLING Bilateral 02/24/2019   Procedure: Bilateral lipofilling of breasts and liposuction of lateral breasts for symmetry;  Surgeon: Peggye Form, DO;  Location: [facility];  Service: Plastics;  Laterality: Bilateral;  2 hours, please   MASTECTOMY W/ SENTINEL NODE BIOPSY Bilateral 06/22/2017   Procedure: LEFT MASTECTOMY WITH SENTINEL LYMPH NODE BIOPSY AND RIGHT PROPHYLACTIC MASTECTOMY;  Surgeon: Griselda Miner, MD;  Location: [facility];  Service: General;  Laterality: Bilateral;   MENISCUS DEBRIDEMENT  05/02/2011   Procedure: DEBRIDEMENT OF MENISCUS;  Surgeon: Javier Docker;  Location: [facility];  Service: Orthopedics;  Laterality: Right;   PORTA CATH REMOVAL  2005   PORTACATH PLACEMENT  2004   REMOVAL OF BILATERAL TISSUE EXPANDERS WITH PLACEMENT OF BILATERAL BREAST IMPLANTS Bilateral 05/26/2018   Procedure: REMOVAL OF BILATERAL TISSUE EXPANDERS WITH PLACEMENT OF BILATERAL BREAST IMPLANTS;  Surgeon: Peggye Form, DO;  Location: [facility];  Service: Plastics;  Laterality: Bilateral;   REMOVAL OF TISSUE EXPANDER AND PLACEMENT OF IMPLANT Right 12/12/2017   Procedure: REMOVAL OF TISSUE EXPANDER, RIGHT;   Surgeon: Glenna Fellows, MD;  Location: [facility] OR;  Service: Plastics;  Laterality: Right;   THYROIDECTOMY N/A 09/22/2012   Procedure: RIGHT THYROID LOBECTOMY WITH FROZEN SECTION;  Surgeon: Serena Colonel, MD;  Location: [facility] OR;  Service: ENT;  Laterality: N/A;   THYROIDECTOMY Left 10/21/2012   Procedure: COMPLETION OF THYROIDECTOMY;  Surgeon: Serena Colonel, MD;  Location: [facility] OR;  Service: ENT;  Laterality: Left;   TISSUE EXPANDER PLACEMENT Right 03/03/2018   Procedure: TISSUE EXPANDER PLACEMENT;  Surgeon: Peggye Form, DO;  Location: [facility];  Service: Plastics;  Laterality: Right;   TOTAL KNEE ARTHROPLASTY  04/14/2012   Procedure: TOTAL KNEE ARTHROPLASTY;  Surgeon: Loreta Ave, MD;  Location: [facility] OR;  Service: Orthopedics;  Laterality: Right;  RIGHT ARTHROPLASTY KNEE MEDIAL/LATERAL COMPARTMENTS WITH PATELLA RESURFACING   TOTAL KNEE ARTHROPLASTY Left 03/30/2013   Procedure: TOTAL KNEE ARTHROPLASTY;  Surgeon: Loreta Ave, MD;  Location: [facility] OR;  Service: Orthopedics;  Laterality: Left;   VENTRAL HERNIA REPAIR N/A 02/10/2020   Procedure: LAPAROSCOPIC ASSISTED VENTRAL HERNIA REPAIR;  Surgeon: Griselda Miner, MD;  Location: [facility] OR;  Service: General;  Laterality: N/A;   Patient Active Problem List   Diagnosis Date Noted   Breast asymmetry following reconstructive surgery 05/22/2023   Torn earlobe, left, initial encounter 12/17/2018   S/P breast reconstruction, bilateral 03/16/2018   History of breast cancer in female 01/27/2018   History of reconstruction of both breasts 01/27/2018   Acquired absence of right breast 09/23/2017   Breast cancer (HCC) 06/22/2017   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   Genetic testing 04/17/2017   DJD (degenerative joint disease) of knee 03/30/2013   Witnessed episode of apnea 03/23/2013   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   Weakness 11/28/2011   Hypertension 11/28/2011   Hypokalemia 11/28/2011   LUE weakness 11/28/2011    Hypothyroidism 11/28/2011   Thyroid cancer (HCC) 04/28/2010    PCP: Renaye Rakers, MD  REFERRING PROVIDER: Tarry Kos, MD  REFERRING DIAG: [phone] (ICD-10-CM) - Pain in left hand M25.511,G89.29 (ICD-10-CM) - Chronic right shoulder pain M54.2 (ICD-10-CM) - Neck pain  THERAPY DIAG:  Right shoulder pain, unspecified chronicity  Cervicalgia  Muscle weakness (generalized)  Cramp and spasm  Pain in left hand  Rationale for Evaluation and Treatment: Rehabilitation  ONSET DATE: Left thumb pain for years, Right shoulder pain for at least 2 months  SUBJECTIVE:  SUBJECTIVE STATEMENT: Patient reports that the dry needling helped a lot, especially in her thumb.   Hand dominance: Right  PERTINENT HISTORY: Having thyroid biopsy 3/1 Right side Breast Cancer s/p bilateral mastectomy, ACDF with solid fusion 2008, right TKA 2013, left TKA 2014  PAIN:  Are you having pain? Yes: NPRS scale: 2-3/10 Pain location: left thumb, right shoulder, cervical Pain description: sore, sometimes shooting Aggravating factors: certain movements Relieving factors: sometimes Motrin will improve, heat  PRECAUTIONS: Other: s/p bilateral mastectomy  RED FLAGS: None   WEIGHT BEARING RESTRICTIONS: No  FALLS:  Has patient fallen in last 6 months? No  LIVING ENVIRONMENT: Lives with: lives alone Lives in: House/apartment Stairs:  2 story home Has following equipment at home: shower chair  OCCUPATION: Works at home for [employer]  PLOF: Independent, Vocation/Vocational requirements: requires computer/ipad and phone work, and Leisure: traveling/cruising  PATIENT GOALS:  To get some relief and walk away with some exercises that I can continue to do independently.  NEXT MD VISIT: Will follow up with Dr Roda Shutters  OBJECTIVE:   Note: Objective measures were completed at Evaluation unless otherwise noted.  DIAGNOSTIC FINDINGS:  Cervical Radiograph on 06/16/23: X-rays of the cervical spine show prior ACDF with solid fusion.  Anterior  plate with screws without any complications.   Right shoulder radiograph on 06/16/23: X-rays of the right shoulder shows degenerative arthritis at the [facility] joint.   Otherwise unremarkable.   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   PATIENT SURVEYS:  Eval:  UEFS 28 / 80 = 35.0 %  COGNITION: Overall cognitive status: Within functional limits for tasks assessed     SENSATION: Patient states that she has had some numbness and tingling down her right arm.  POSTURE: Slight forward head and rounded shoulders  UPPER EXTREMITY ROM:   Active ROM Right eval Left eval  Shoulder flexion 123 147  Shoulder extension 45 42  Shoulder abduction 124 144  Shoulder adduction    Shoulder internal rotation    Shoulder external rotation With pain With pain  Elbow flexion    Elbow extension    Wrist flexion    Wrist extension    Wrist ulnar deviation    Wrist radial deviation    Wrist pronation    Wrist supination    (Blank rows = not tested)  UPPER EXTREMITY MMT:  MMT Right eval Left eval  Shoulder flexion 4 5  Shoulder extension    Shoulder abduction 4 5  Shoulder adduction    Shoulder internal rotation 4 4  Shoulder external rotation 4 4  Middle trapezius    Lower trapezius    Elbow flexion    Elbow extension    Wrist flexion    Wrist extension    Wrist ulnar deviation    Wrist radial deviation    Wrist pronation    Wrist supination    Grip strength (lbs) 50 40  (Blank rows = not tested)  SHOULDER SPECIAL TESTS: Impingement tests: Hawkins/Kennedy impingement test: positive  Rotator cuff assessment: Empty can test: positive    PALPATION:  Some tenderness to palpation along right  shoulder  TODAY'S TREATMENT   DATE: 07/01/2023 Shoulder pulleys for flexion and abduction x2 min each Seated clasped hands shoulder flexion x30 Seated scapular retraction 2x10 Seated upper trap stretch x20 sec bilat Seated levator stretch x20 sec bilat Standing right shoulder ER stretch with towel 2x20 sec Standing right shoulder IR stretch with towel 2x20 sec Supine shoulder flexion with 1# weight on cane 2x10 Supine chest press with 1# weight on cane 2x10 Supine serratus punch with 1# weight 2x10 bilat Supine tracing of alphabet with 1# weight A-Z bilat Supine shoulder horizontal abduction with yellow tband 2x10   DATE: 06/24/2023 Supine clasped hands shoulder flexion 10x (added to HEP) Supine dowel external rotation (or clasped hands to top of the head) (added to HEP) Supine dowel horizontal abduction (added to HEP) Thumb circumduction (added to HEP) Seated table flexion slides (added to HEP) Trigger Point Dry Needling Initial Treatment: Pt instructed on Dry Needling rational, procedures, and possible side effects. Pt instructed to expect mild to moderate muscle soreness later in the day and/or into the next day.  Pt instructed in methods to reduce muscle soreness. Pt instructed to continue prescribed HEP. Patient verbalized understanding of these instructions and education.  Patient Verbal Consent Given: Yes Education Handout Provided: Yes Muscles Treated: right upper trap (posterior and anterior approach); left abductor pollicis, flexor pollicis brevis, adductor pollicis Electrical Stimulation Performed: No Treatment Response/Outcome: decreased tender points and improved soft tissue mobility on all treated muscles      DATE: 06/17/2023 Issued and reviewed HEP Discussed role of PT   PATIENT EDUCATION: Education details: Issued HEP Person educated:  Patient Education method: Explanation, Facilities manager, and Handouts Education comprehension: verbalized understanding and returned demonstration  HOME EXERCISE PROGRAM: Access Code: [email or web address] URL: [email or web address] Date: 07/01/2023 Prepared by: Clydie Braun [employer]  Exercises - Seated Scapular Retraction  - 1 x daily - 7 x weekly - 2 sets - 10 reps - Seated Cervical Retraction  - 1 x daily - 7 x weekly - 2 sets - 10 reps - Seated Upper Trapezius Stretch  - 1 x daily - 7 x weekly - 2 reps - 20 sec hold - Seated Levator Scapulae Stretch  - 1 x daily - 7 x weekly - 2 reps - 20 sec hold - Seated Shoulder Flexion Towel Slide at Table Top  - 1 x daily - 7 x weekly - 1 sets - 10 reps - Seated Thumb Circumduction AROM  - 1 x daily - 7 x weekly - 2 sets - 5 reps - Supine Shoulder External Rotation with Dowel  - 1 x daily - 7 x weekly - 1 sets - 10 reps - Supine Shoulder Horizontal Abduction Adduction AAROM with Dowel  - 1 x daily - 7 x weekly - 1 sets - 10 reps - Supine Shoulder Flexion Extension AAROM with Dowel  - 1 x daily - 7 x weekly - 2 sets - 10 reps - Supine Shoulder Press AAROM in Abduction with Dowel  - 1 x daily - 7 x weekly - 2 sets - 10 reps - Single Arm Serratus Punches in Supine with Dumbbell  - 1 x daily - 7 x weekly - 2 sets - 10 reps - Supine Shoulder Alphabet  - 1 x daily - 7 x weekly - 1-2 reps  ASSESSMENT:  CLINICAL IMPRESSION: Ms Agent presents to skilled PT reporting that the dry needling helped and she has been doing her exercises.  Patient with great participation throughout session.  Patient good using cane during  exercises and states that she has one at home that she can utilize.  Educated patient that she does not have to use the weights at home.  Patient able to progress with scapular strengthening exercises during session.  Updated HEP to include new exercises and add in scapular stabilization exercises.   OBJECTIVE IMPAIRMENTS: decreased activity  tolerance, decreased ROM, decreased strength, increased muscle spasms, impaired UE functional use, postural dysfunction, and pain.   ACTIVITY LIMITATIONS: carrying, lifting, and sleeping  PARTICIPATION LIMITATIONS: meal prep, cleaning, driving, and community activity  PERSONAL FACTORS: Time since onset of injury/illness/exacerbation and 3+ comorbidities: OA, Hx of breast cancer with bilateral mastectomy, Bilateral TKA  are also affecting patient's functional outcome.   REHAB POTENTIAL: Good  CLINICAL DECISION MAKING: Evolving/moderate complexity  EVALUATION COMPLEXITY: Moderate   GOALS: Goals reviewed with patient? Yes  SHORT TERM GOALS: Target date: 07/10/2023  Patient will be independent with initial HEP. Baseline: Goal status: Met on 07/01/23  2.  Patient will increase right shoulder A/ROM to [facility] to allow her to put items away in overhead cabinets and wash her back. Baseline:  Goal status: Ongoing   LONG TERM GOALS: Target date: 08/14/2023  Patient will be independent with advanced HEP to allow for self progression post discharge. Baseline:  Goal status: Ongoing  2.  Patient will improve Upper Extremity Functional Scale to no greater than 25% to demonstrate improvements with functional tasks. Baseline: 35% Goal status: INITIAL  3.  Patient will increase upper extremity strength to [facility] to allow her to lift heavier items, such as a suitcase without difficulty. Baseline:  Goal status: INITIAL  4.  Patient will report ability to return to driving and turning to look over her shoulder without increased pain. Baseline:  Goal status: INITIAL  5.  Patient will report ability to complete all work related tasks with 70% decreased pain by end of shift. Baseline:  Goal status: INITIAL   PLAN:  PT FREQUENCY: 2x/week  PT DURATION: 8 weeks  PLANNED INTERVENTIONS: 97164- PT Re-evaluation, 97110-Therapeutic exercises, 97530- Therapeutic activity, 97112- Neuromuscular  re-education, 97535- Self Care, 16109- Manual therapy, [phone]- Aquatic Therapy, [phone]- Electrical stimulation (unattended), [phone]- Electrical stimulation (manual), 97016- Vasopneumatic device, [id number]- Ultrasound, [id number]- Traction (mechanical), [id number]- Ionotophoresis 4mg /ml Dexamethasone, Patient/Family education, Taping, Dry Needling, Joint mobilization, Joint manipulation, Spinal manipulation, Spinal mobilization, Scar mobilization, Cryotherapy, and Moist heat  PLAN FOR NEXT SESSION: Assess and progress HEP as indicated, strengthening, flexibility, manual/dry needling as indicated    Reather Laurence, PT, DPT 07/01/23, 9:38 AM  [facility] Site [address] [place], [place] [address] Phone # [phone] Fax [phone]

## 2023-07-03 ENCOUNTER — Ambulatory Visit: Payer: Medicare Other | Admitting: Physical Therapy

## 2023-07-03 DIAGNOSIS — R252 Cramp and spasm: Secondary | ICD-10-CM | POA: Diagnosis not present

## 2023-07-03 DIAGNOSIS — M542 Cervicalgia: Secondary | ICD-10-CM

## 2023-07-03 DIAGNOSIS — M6281 Muscle weakness (generalized): Secondary | ICD-10-CM | POA: Diagnosis not present

## 2023-07-03 DIAGNOSIS — M25511 Pain in right shoulder: Secondary | ICD-10-CM

## 2023-07-03 DIAGNOSIS — M79642 Pain in left hand: Secondary | ICD-10-CM | POA: Diagnosis not present

## 2023-07-03 NOTE — Therapy (Signed)
 OUTPATIENT PHYSICAL THERAPY SHOULDER PROGRESS NOTE  Patient Name: [name] MRN: [id number] DOB:[birth year], 73 y.o., female Today's Date: 07/03/2023  END OF SESSION:  PT End of Session - 07/03/23 0843     Visit Number 4    Date for PT Re-Evaluation 08/14/23    Authorization Type U[employer]    Progress Note Due on Visit 10    PT Start Time 0845    PT Stop Time 0925    PT Time Calculation (min) 40 min    Activity Tolerance Patient tolerated treatment well             Past Medical History:  Diagnosis Date   Anemia    "years ago"   Arthritis    "left hand; was in my knees" (09/24/2017)   Breast cancer, right breast (HCC) 2004   Bruises easily    Chronic lower back pain    Ductal carcinoma in situ (DCIS) of left breast 2018   Genetic testing 04/17/2017   STAT Breast panel with reflex to Multi-Cancer panel (83 genes) @ [facility] - No pathogenic mutations detected   History of bronchitis    when she was young   Hypertension    no longer taking medications as of August 2019   Hypothyroidism    takes Synthroid daily   Joint pain    Joint swelling    Personal history of chemotherapy 04/2003   Personal history of radiation therapy 2005   Pneumonia    hx of when she was young   Right elbow tendonitis    Sleep apnea    has a machine but does not use (09/24/2017)   Thyroid cancer (HCC) 2012   Urinary frequency    Past Surgical History:  Procedure Laterality Date   ANTERIOR CERVICAL DECOMP/DISCECTOMY FUSION  2008   APPENDECTOMY  2007   BREAST BIOPSY Right 02/28/2003   malignant   BREAST BIOPSY Left 03/2017   BREAST LUMPECTOMY Right 2004   BREAST RECONSTRUCTION WITH PLACEMENT OF TISSUE EXPANDER AND FLEX HD (ACELLULAR HYDRATED DERMIS) Bilateral 06/22/2017   Procedure: IMMEDIATE BILATERAL BREAST RECONSTRUCTION WITH PLACEMENT OF TISSUE EXPANDER AND FLEX HD (ACELLULAR HYDRATED DERMIS);  Surgeon: Peggye Form, DO;  Location: [facility];  Service: Plastics;   Laterality: Bilateral;   CESAREAN SECTION  1981   COLONOSCOPY  X 2   INSERTION OF MESH N/A 02/10/2020   Procedure: INSERTION OF MESH;  Surgeon: Griselda Miner, MD;  Location: [facility] OR;  Service: General;  Laterality: N/A;   KNEE ARTHROSCOPY Right 2007   KNEE ARTHROSCOPY  05/02/2011   Procedure: ARTHROSCOPY KNEE;  Surgeon: Javier Docker;  Location: [facility];  Service: Orthopedics;  Laterality: Right;   LATISSIMUS FLAP TO BREAST Right 09/23/2017   Procedure: RIGHT BREAST LATISSIMUS DORSI FLAP RECONSTRUCTION WITH TISSUE EXPANDER;  Surgeon: Peggye Form, DO;  Location: [facility] OR;  Service: Plastics;  Laterality: Right;   LIPOSUCTION WITH LIPOFILLING Bilateral 02/24/2019   Procedure: Bilateral lipofilling of breasts and liposuction of lateral breasts for symmetry;  Surgeon: Peggye Form, DO;  Location: [facility];  Service: Plastics;  Laterality: Bilateral;  2 hours, please   MASTECTOMY W/ SENTINEL NODE BIOPSY Bilateral 06/22/2017   Procedure: LEFT MASTECTOMY WITH SENTINEL LYMPH NODE BIOPSY AND RIGHT PROPHYLACTIC MASTECTOMY;  Surgeon: Griselda Miner, MD;  Location:  SURGERY CENTER;  Service: General;  Laterality: Bilateral;   MENISCUS DEBRIDEMENT  05/02/2011   Procedure: DEBRIDEMENT OF MENISCUS;  Surgeon: Tinnie Gens  C Beane;  Location: [facility];  Service: Orthopedics;  Laterality: Right;   PORTA CATH REMOVAL  2005   PORTACATH PLACEMENT  2004   REMOVAL OF BILATERAL TISSUE EXPANDERS WITH PLACEMENT OF BILATERAL BREAST IMPLANTS Bilateral 05/26/2018   Procedure: REMOVAL OF BILATERAL TISSUE EXPANDERS WITH PLACEMENT OF BILATERAL BREAST IMPLANTS;  Surgeon: Peggye Form, DO;  Location: [facility];  Service: Plastics;  Laterality: Bilateral;   REMOVAL OF TISSUE EXPANDER AND PLACEMENT OF IMPLANT Right 12/12/2017   Procedure: REMOVAL OF TISSUE EXPANDER, RIGHT;  Surgeon: Glenna Fellows, MD;  Location: [facility] OR;  Service: Plastics;   Laterality: Right;   THYROIDECTOMY N/A 09/22/2012   Procedure: RIGHT THYROID LOBECTOMY WITH FROZEN SECTION;  Surgeon: Serena Colonel, MD;  Location: [facility] OR;  Service: ENT;  Laterality: N/A;   THYROIDECTOMY Left 10/21/2012   Procedure: COMPLETION OF THYROIDECTOMY;  Surgeon: Serena Colonel, MD;  Location: [facility] OR;  Service: ENT;  Laterality: Left;   TISSUE EXPANDER PLACEMENT Right 03/03/2018   Procedure: TISSUE EXPANDER PLACEMENT;  Surgeon: Peggye Form, DO;  Location: [facility];  Service: Plastics;  Laterality: Right;   TOTAL KNEE ARTHROPLASTY  04/14/2012   Procedure: TOTAL KNEE ARTHROPLASTY;  Surgeon: Loreta Ave, MD;  Location: [facility] OR;  Service: Orthopedics;  Laterality: Right;  RIGHT ARTHROPLASTY KNEE MEDIAL/LATERAL COMPARTMENTS WITH PATELLA RESURFACING   TOTAL KNEE ARTHROPLASTY Left 03/30/2013   Procedure: TOTAL KNEE ARTHROPLASTY;  Surgeon: Loreta Ave, MD;  Location: [facility] OR;  Service: Orthopedics;  Laterality: Left;   VENTRAL HERNIA REPAIR N/A 02/10/2020   Procedure: LAPAROSCOPIC ASSISTED VENTRAL HERNIA REPAIR;  Surgeon: Griselda Miner, MD;  Location: [facility] OR;  Service: General;  Laterality: N/A;   Patient Active Problem List   Diagnosis Date Noted   Breast asymmetry following reconstructive surgery 05/22/2023   Torn earlobe, left, initial encounter 12/17/2018   S/P breast reconstruction, bilateral 03/16/2018   History of breast cancer in female 01/27/2018   History of reconstruction of both breasts 01/27/2018   Acquired absence of right breast 09/23/2017   Breast cancer (HCC) 06/22/2017   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   Genetic testing 04/17/2017   DJD (degenerative joint disease) of knee 03/30/2013   Witnessed episode of apnea 03/23/2013   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   Weakness 11/28/2011   Hypertension 11/28/2011   Hypokalemia 11/28/2011   LUE weakness 11/28/2011   Hypothyroidism 11/28/2011   Thyroid cancer (HCC) 04/28/2010    PCP:  Renaye Rakers, MD  REFERRING PROVIDER: Tarry Kos, MD  REFERRING DIAG: [phone] (ICD-10-CM) - Pain in left hand M25.511,G89.29 (ICD-10-CM) - Chronic right shoulder pain M54.2 (ICD-10-CM) - Neck pain  THERAPY DIAG:  Right shoulder pain, unspecified chronicity  Cervicalgia  Muscle weakness (generalized)  Pain in left hand  Rationale for Evaluation and Treatment: Rehabilitation  ONSET DATE: Left thumb pain for years, Right shoulder pain for at least 2 months  SUBJECTIVE:  SUBJECTIVE STATEMENT: I haven't had as much of the thumb pain at all this week.  The DN really helps my shoulder too. Some upper trap, upper am pain, left thumb  Hand dominance: Right  PERTINENT HISTORY: Having thyroid biopsy 3/1 Right side Breast Cancer s/p bilateral mastectomy, ACDF with solid fusion 2008, right TKA 2013, left TKA 2014  PAIN:  Are you having pain? Yes: NPRS scale: 2-3/10 Pain location: left thumb, right shoulder, cervical Pain description: sore, sometimes shooting Aggravating factors: certain movements Relieving factors: sometimes Motrin will improve, heat  PRECAUTIONS: Other: s/p bilateral mastectomy  RED FLAGS: None   WEIGHT BEARING RESTRICTIONS: No  FALLS:  Has patient fallen in last 6 months? No  LIVING ENVIRONMENT: Lives with: lives alone Lives in: House/apartment Stairs:  2 story home Has following equipment at home: shower chair  OCCUPATION: Works at home for [employer]  PLOF: Independent, Vocation/Vocational requirements: requires computer/ipad and phone work, and Leisure: traveling/cruising  PATIENT GOALS:  To get some relief and walk away with some exercises that I can continue to do independently.  NEXT MD VISIT: Will follow up with Dr Roda Shutters  OBJECTIVE:  Note: Objective measures  were completed at Evaluation unless otherwise noted.  DIAGNOSTIC FINDINGS:  Cervical Radiograph on 06/16/23: X-rays of the cervical spine show prior ACDF with solid fusion.  Anterior  plate with screws without any complications.   Right shoulder radiograph on 06/16/23: X-rays of the right shoulder shows degenerative arthritis at the [facility] joint.   Otherwise unremarkable.   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   PATIENT SURVEYS:  Eval:  UEFS 28 / 80 = 35.0 %  COGNITION: Overall cognitive status: Within functional limits for tasks assessed     SENSATION: Patient states that she has had some numbness and tingling down her right arm.  POSTURE: Slight forward head and rounded shoulders  UPPER EXTREMITY ROM:   Active ROM Right eval Left eval  Shoulder flexion 123 147  Shoulder extension 45 42  Shoulder abduction 124 144  Shoulder adduction    Shoulder internal rotation    Shoulder external rotation With pain With pain  Elbow flexion    Elbow extension    Wrist flexion    Wrist extension    Wrist ulnar deviation    Wrist radial deviation    Wrist pronation    Wrist supination    (Blank rows = not tested)  UPPER EXTREMITY MMT:  MMT Right eval Left eval  Shoulder flexion 4 5  Shoulder extension    Shoulder abduction 4 5  Shoulder adduction    Shoulder internal rotation 4 4  Shoulder external rotation 4 4  Middle trapezius    Lower trapezius    Elbow flexion    Elbow extension    Wrist flexion    Wrist extension    Wrist ulnar deviation    Wrist radial deviation    Wrist pronation    Wrist supination    Grip strength (lbs) 50 40  (Blank rows = not tested)  SHOULDER SPECIAL TESTS: Impingement tests: Hawkins/Kennedy impingement test: positive  Rotator cuff assessment: Empty can test: positive    PALPATION:  Some tenderness to palpation along right shoulder  TODAY'S TREATMENT  DATE: 07/03/2023 Supine clasped hand shoulder flexion  Seated upper trap stretch 30 sec hold 2x Standing overhead press alternating 1# 10x right/left (added to HEP) Standing shoulder flexion bil 1# 10x  (pt has pair of 1# weights at home never used) (added to HEP) Manual therapy: soft tissue mobilization to left thumb and right cervical/shoulder muscules Trigger Point Dry Needling Initial Treatment: Pt instructed on Dry Needling rational, procedures, and possible side effects. Pt instructed to expect mild to moderate muscle soreness later in the day and/or into the next day.  Pt instructed in methods to reduce muscle soreness. Pt instructed to continue prescribed HEP. Patient verbalized understanding of these instructions and education.  Patient Verbal Consent Given: Yes Education Handout Provided: Yes Muscles Treated: right supraspinatus; right upper trap (posterior and anterior approach -significant twitch with anterior approach); left abductor pollicis, flexor pollicis brevis, adductor pollicis Electrical Stimulation Performed: No Treatment Response/Outcome: decreased tender points and improved soft tissue mobility on all treated muscles DATE: 07/01/2023 Shoulder pulleys for flexion and abduction x2 min each Seated clasped hands shoulder flexion x30 Seated scapular retraction 2x10 Seated upper trap stretch x20 sec bilat Seated levator stretch x20 sec bilat Standing right shoulder ER stretch with towel 2x20 sec Standing right shoulder IR stretch with towel 2x20 sec Supine shoulder flexion with 1# weight on cane 2x10 Supine chest press with 1# weight on cane 2x10 Supine serratus punch with 1# weight 2x10 bilat Supine tracing of alphabet with 1# weight A-Z bilat Supine shoulder horizontal abduction with yellow tband 2x10   DATE: 06/24/2023 Supine clasped hands  shoulder flexion 10x (added to HEP) Supine dowel external rotation (or clasped hands to top of the head) (added to HEP) Supine dowel horizontal abduction (added to HEP) Thumb circumduction (added to HEP) Seated table flexion slides (added to HEP) Trigger Point Dry Needling Initial Treatment: Pt instructed on Dry Needling rational, procedures, and possible side effects. Pt instructed to expect mild to moderate muscle soreness later in the day and/or into the next day.  Pt instructed in methods to reduce muscle soreness. Pt instructed to continue prescribed HEP. Patient verbalized understanding of these instructions and education.  Patient Verbal Consent Given: Yes Education Handout Provided: Yes Muscles Treated: right upper trap (posterior and anterior approach); left abductor pollicis, flexor pollicis brevis, adductor pollicis Electrical Stimulation Performed: No Treatment Response/Outcome: decreased tender points and improved soft tissue mobility on all treated muscles      DATE: 06/17/2023 Issued and reviewed HEP Discussed role of PT   PATIENT EDUCATION: Education details: Issued HEP Person educated: Patient Education method: Explanation, Facilities manager, and Handouts Education comprehension: verbalized understanding and returned demonstration  HOME EXERCISE PROGRAM: Access Code: [email or web address] URL: [email or web address] Date: 07/03/2023 Prepared by: Lavinia Sharps  Exercises - Seated Scapular Retraction  - 1 x daily - 7 x weekly - 2 sets - 10 reps - Seated Cervical Retraction  - 1 x daily - 7 x weekly - 2 sets - 10 reps - Seated Upper Trapezius Stretch  - 1 x daily - 7 x weekly - 2 reps - 20 sec hold - Seated Levator Scapulae Stretch  - 1 x daily - 7 x weekly - 2 reps - 20 sec hold - Seated Shoulder Flexion Towel Slide at Table Top  - 1 x daily - 7 x weekly - 1 sets - 10 reps - Seated Thumb Circumduction AROM  - 1 x daily - 7 x weekly - 2 sets - 5 reps - Supine  Shoulder External Rotation with Dowel  - 1 x daily - 7 x weekly - 1 sets - 10 reps - Supine Shoulder Horizontal Abduction Adduction AAROM with Dowel  - 1 x daily - 7 x weekly - 1 sets - 10 reps - Supine Shoulder Flexion Extension AAROM with Dowel  - 1 x daily - 7 x weekly - 2 sets - 10 reps - Supine Shoulder Press AAROM in Abduction with Dowel  - 1 x daily - 7 x weekly - 2 sets - 10 reps - Single Arm Serratus Punches in Supine with Dumbbell  - 1 x daily - 7 x weekly - 2 sets - 10 reps - Supine Shoulder Alphabet  - 1 x daily - 7 x weekly - 1-2 reps - Standing Overhead Press with Dumbbells at [employer]  - 1 x daily - 7 x weekly - 1 sets - 10 reps - Standing Shoulder Flexion to 90 Degrees with Dumbbells  - 1 x daily - 7 x weekly - 1 sets - 10 reps ASSESSMENT:  CLINICAL IMPRESSION: Good response to DN with fewer tender points in thenar muscles compared to initial visits.  The patient had  muscle twitches produced during dry needling of upper traps in an anterior approach which is a good prognostic indicator for benefit as it is associated with positive neuromotor and nervous system changes.  She was encouraged in regular performance of HEP post DN including soft tissue lengthening and strengthening exercises to enhance long term benefit. Therapist progressing and updating HEP for increased intensity and challenge level for further strengthening and functional mobility.     OBJECTIVE IMPAIRMENTS: decreased activity tolerance, decreased ROM, decreased strength, increased muscle spasms, impaired UE functional use, postural dysfunction, and pain.   ACTIVITY LIMITATIONS: carrying, lifting, and sleeping  PARTICIPATION LIMITATIONS: meal prep, cleaning, driving, and community activity  PERSONAL FACTORS: Time since onset of injury/illness/exacerbation and 3+ comorbidities: OA, Hx of breast cancer with bilateral mastectomy, Bilateral TKA  are also affecting patient's functional outcome.   REHAB POTENTIAL:  Good  CLINICAL DECISION MAKING: Evolving/moderate complexity  EVALUATION COMPLEXITY: Moderate   GOALS: Goals reviewed with patient? Yes  SHORT TERM GOALS: Target date: 07/10/2023  Patient will be independent with initial HEP. Baseline: Goal status: Met on 07/01/23  2.  Patient will increase right shoulder A/ROM to [facility] to allow her to put items away in overhead cabinets and wash her back. Baseline:  Goal status: Ongoing   LONG TERM GOALS: Target date: 08/14/2023  Patient will be independent with advanced HEP to allow for self progression post discharge. Baseline:  Goal status: Ongoing  2.  Patient will improve Upper Extremity Functional Scale to no greater than 25% to demonstrate improvements with functional tasks. Baseline: 35% Goal status: INITIAL  3.  Patient will increase upper extremity strength to [facility] to allow her to lift heavier items, such as a suitcase without difficulty. Baseline:  Goal status: INITIAL  4.  Patient will report ability to return to driving and turning to look over her shoulder without increased pain. Baseline:  Goal status: INITIAL  5.  Patient will report ability to complete all work related tasks with 70% decreased pain by end of shift. Baseline:  Goal status: INITIAL   PLAN:  PT FREQUENCY: 2x/week  PT DURATION: 8 weeks  PLANNED INTERVENTIONS: 97164- PT Re-evaluation, 97110-Therapeutic exercises, 97530- Therapeutic activity, [id number]- Neuromuscular re-education, 97535- Self Care, 40981- Manual therapy, [id number]- Aquatic Therapy, [id number]- Electrical stimulation (unattended), [id number]- Electrical stimulation (manual), 97016- Vasopneumatic device,  40981- Ultrasound, 19147- Traction (mechanical), [id number]- Ionotophoresis 4mg /ml Dexamethasone, Patient/Family education, Taping, Dry Needling, Joint mobilization, Joint manipulation, Spinal manipulation, Spinal mobilization, Scar mobilization, Cryotherapy, and Moist heat  PLAN FOR NEXT SESSION: Assess and progress  HEP as indicated, strengthening, flexibility, manual/dry needling as indicated; has 1# weights at home  Lavinia Sharps, PT 07/03/23 10:41 AM Phone: [phone] Fax: [phone]   [facility] [address], [address] [place], [place] [address] Phone # [phone] Fax [phone]

## 2023-07-06 ENCOUNTER — Ambulatory Visit
Admission: RE | Admit: 2023-07-06 | Discharge: 2023-07-06 | Disposition: A | Discharge status: Home / Self Care | Source: Ambulatory Visit | Attending: Endocrinology | Admitting: Endocrinology

## 2023-07-06 ENCOUNTER — Other Ambulatory Visit (HOSPITAL_COMMUNITY)
Admission: RE | Admit: 2023-07-06 | Discharge: 2023-07-06 | Disposition: A | Discharge status: Home / Self Care | Source: Ambulatory Visit | Attending: Endocrinology | Admitting: Endocrinology

## 2023-07-06 ENCOUNTER — Ambulatory Visit: Payer: Medicare Other | Admitting: Rehabilitative and Restorative Service Providers"

## 2023-07-06 DIAGNOSIS — Z85850 Personal history of malignant neoplasm of thyroid: Secondary | ICD-10-CM | POA: Diagnosis not present

## 2023-07-06 DIAGNOSIS — E041 Nontoxic single thyroid nodule: Secondary | ICD-10-CM | POA: Diagnosis not present

## 2023-07-06 DIAGNOSIS — Z9089 Acquired absence of other organs: Secondary | ICD-10-CM | POA: Diagnosis not present

## 2023-07-06 DIAGNOSIS — C73 Malignant neoplasm of thyroid gland: Secondary | ICD-10-CM | POA: Insufficient documentation

## 2023-07-06 DIAGNOSIS — R221 Localized swelling, mass and lump, neck: Secondary | ICD-10-CM | POA: Diagnosis not present

## 2023-07-08 ENCOUNTER — Ambulatory Visit: Payer: Medicare Other | Admitting: Rehabilitative and Restorative Service Providers"

## 2023-07-08 ENCOUNTER — Telehealth: Payer: Self-pay | Admitting: Orthopaedic Surgery

## 2023-07-08 LAB — CYTOLOGY - NON PAP

## 2023-07-08 NOTE — Telephone Encounter (Signed)
 Pt called requesting a call back and pt states Dr. Roda Shutters for hand surgery. Pt is asking for medical sheet to move forward with surgery. Please submit to insurance with pre auth surgery sheet. Pt phone number is [phone].

## 2023-07-13 ENCOUNTER — Other Ambulatory Visit: Payer: Medicare Other

## 2023-07-14 ENCOUNTER — Ambulatory Visit: Payer: Medicare Other | Admitting: Physical Therapy

## 2023-07-14 DIAGNOSIS — M79642 Pain in left hand: Secondary | ICD-10-CM | POA: Diagnosis not present

## 2023-07-14 DIAGNOSIS — M6281 Muscle weakness (generalized): Secondary | ICD-10-CM

## 2023-07-14 DIAGNOSIS — M25511 Pain in right shoulder: Secondary | ICD-10-CM

## 2023-07-14 DIAGNOSIS — M542 Cervicalgia: Secondary | ICD-10-CM | POA: Diagnosis not present

## 2023-07-14 DIAGNOSIS — R252 Cramp and spasm: Secondary | ICD-10-CM | POA: Diagnosis not present

## 2023-07-14 NOTE — Therapy (Signed)
 OUTPATIENT PHYSICAL THERAPY SHOULDER PROGRESS NOTE  Patient Name: [name] MRN: [id number] DOB:01/18/1951, 73 y.o., female Today's Date: 07/14/2023  END OF SESSION:  PT End of Session - 07/14/23 0749     Visit Number 5    Date for PT Re-Evaluation 08/14/23    Authorization Type UMR    Progress Note Due on Visit 10    PT Start Time 0750    PT Stop Time 0835    PT Time Calculation (min) 45 min    Activity Tolerance Patient tolerated treatment well             Past Medical History:  Diagnosis Date   Anemia    "years ago"   Arthritis    "left hand; was in my knees" (09/24/2017)   Breast cancer, right breast (HCC) 2004   Bruises easily    Chronic lower back pain    Ductal carcinoma in situ (DCIS) of left breast 2018   Genetic testing 04/17/2017   STAT Breast panel with reflex to Multi-Cancer panel (83 genes) @ [facility] - No pathogenic mutations detected   History of bronchitis    when she was young   Hypertension    no longer taking medications as of August 2019   Hypothyroidism    takes Synthroid daily   Joint pain    Joint swelling    Personal history of chemotherapy 04/2003   Personal history of radiation therapy 2005   Pneumonia    hx of when she was young   Right elbow tendonitis    Sleep apnea    has a machine but does not use (09/24/2017)   Thyroid cancer (HCC) 2012   Urinary frequency    Past Surgical History:  Procedure Laterality Date   ANTERIOR CERVICAL DECOMP/DISCECTOMY FUSION  2008   APPENDECTOMY  2007   BREAST BIOPSY Right 02/28/2003   malignant   BREAST BIOPSY Left 03/2017   BREAST LUMPECTOMY Right 2004   BREAST RECONSTRUCTION WITH PLACEMENT OF TISSUE EXPANDER AND FLEX HD (ACELLULAR HYDRATED DERMIS) Bilateral 06/22/2017   Procedure: IMMEDIATE BILATERAL BREAST RECONSTRUCTION WITH PLACEMENT OF TISSUE EXPANDER AND FLEX HD (ACELLULAR HYDRATED DERMIS);  Surgeon: Peggye Form, DO;  Location: [facility];  Service: Plastics;   Laterality: Bilateral;   CESAREAN SECTION  1981   COLONOSCOPY  X 2   INSERTION OF MESH N/A 02/10/2020   Procedure: INSERTION OF MESH;  Surgeon: Griselda Miner, MD;  Location: [facility] OR;  Service: General;  Laterality: N/A;   KNEE ARTHROSCOPY Right 2007   KNEE ARTHROSCOPY  05/02/2011   Procedure: ARTHROSCOPY KNEE;  Surgeon: Javier Docker;  Location: [facility];  Service: Orthopedics;  Laterality: Right;   LATISSIMUS FLAP TO BREAST Right 09/23/2017   Procedure: RIGHT BREAST LATISSIMUS DORSI FLAP RECONSTRUCTION WITH TISSUE EXPANDER;  Surgeon: Peggye Form, DO;  Location: [facility] OR;  Service: Plastics;  Laterality: Right;   LIPOSUCTION WITH LIPOFILLING Bilateral 02/24/2019   Procedure: Bilateral lipofilling of breasts and liposuction of lateral breasts for symmetry;  Surgeon: Peggye Form, DO;  Location: [facility];  Service: Plastics;  Laterality: Bilateral;  2 hours, please   MASTECTOMY W/ SENTINEL NODE BIOPSY Bilateral 06/22/2017   Procedure: LEFT MASTECTOMY WITH SENTINEL LYMPH NODE BIOPSY AND RIGHT PROPHYLACTIC MASTECTOMY;  Surgeon: Griselda Miner, MD;  Location:  SURGERY CENTER;  Service: General;  Laterality: Bilateral;   MENISCUS DEBRIDEMENT  05/02/2011   Procedure: DEBRIDEMENT OF MENISCUS;  Surgeon: Tinnie Gens  C Beane;  Location: [facility];  Service: Orthopedics;  Laterality: Right;   PORTA CATH REMOVAL  2005   PORTACATH PLACEMENT  2004   REMOVAL OF BILATERAL TISSUE EXPANDERS WITH PLACEMENT OF BILATERAL BREAST IMPLANTS Bilateral 05/26/2018   Procedure: REMOVAL OF BILATERAL TISSUE EXPANDERS WITH PLACEMENT OF BILATERAL BREAST IMPLANTS;  Surgeon: Peggye Form, DO;  Location: [facility];  Service: Plastics;  Laterality: Bilateral;   REMOVAL OF TISSUE EXPANDER AND PLACEMENT OF IMPLANT Right 12/12/2017   Procedure: REMOVAL OF TISSUE EXPANDER, RIGHT;  Surgeon: Glenna Fellows, MD;  Location: [facility];  Service: Plastics;   Laterality: Right;   THYROIDECTOMY N/A 09/22/2012   Procedure: RIGHT THYROID LOBECTOMY WITH FROZEN SECTION;  Surgeon: Serena Colonel, MD;  Location: [facility] OR;  Service: ENT;  Laterality: N/A;   THYROIDECTOMY Left 10/21/2012   Procedure: COMPLETION OF THYROIDECTOMY;  Surgeon: Serena Colonel, MD;  Location: [facility];  Service: ENT;  Laterality: Left;   TISSUE EXPANDER PLACEMENT Right 03/03/2018   Procedure: TISSUE EXPANDER PLACEMENT;  Surgeon: Peggye Form, DO;  Location: [facility];  Service: Plastics;  Laterality: Right;   TOTAL KNEE ARTHROPLASTY  04/14/2012   Procedure: TOTAL KNEE ARTHROPLASTY;  Surgeon: Loreta Ave, MD;  Location: [facility] OR;  Service: Orthopedics;  Laterality: Right;  RIGHT ARTHROPLASTY KNEE MEDIAL/LATERAL COMPARTMENTS WITH PATELLA RESURFACING   TOTAL KNEE ARTHROPLASTY Left 03/30/2013   Procedure: TOTAL KNEE ARTHROPLASTY;  Surgeon: Loreta Ave, MD;  Location: [facility] OR;  Service: Orthopedics;  Laterality: Left;   VENTRAL HERNIA REPAIR N/A 02/10/2020   Procedure: LAPAROSCOPIC ASSISTED VENTRAL HERNIA REPAIR;  Surgeon: Griselda Miner, MD;  Location: [facility] OR;  Service: General;  Laterality: N/A;   Patient Active Problem List   Diagnosis Date Noted   Breast asymmetry following reconstructive surgery 05/22/2023   Torn earlobe, left, initial encounter 12/17/2018   S/P breast reconstruction, bilateral 03/16/2018   History of breast cancer in female 01/27/2018   History of reconstruction of both breasts 01/27/2018   Acquired absence of right breast 09/23/2017   Breast cancer (HCC) 06/22/2017   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   Genetic testing 04/17/2017   DJD (degenerative joint disease) of knee 03/30/2013   Witnessed episode of apnea 03/23/2013   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   Weakness 11/28/2011   Hypertension 11/28/2011   Hypokalemia 11/28/2011   LUE weakness 11/28/2011   Hypothyroidism 11/28/2011   Thyroid cancer (HCC) 04/28/2010    PCP:  Renaye Rakers, MD  REFERRING PROVIDER: Tarry Kos, MD  REFERRING DIAG: [phone] (ICD-10-CM) - Pain in left hand M25.511,G89.29 (ICD-10-CM) - Chronic right shoulder pain M54.2 (ICD-10-CM) - Neck pain  THERAPY DIAG:  Right shoulder pain, unspecified chronicity  Cervicalgia  Muscle weakness (generalized)  Pain in left hand  Rationale for Evaluation and Treatment: Rehabilitation  ONSET DATE: Left thumb pain for years, Right shoulder pain for at least 2 months  SUBJECTIVE:  SUBJECTIVE STATEMENT: I'm having thumb surgery next week. The past week has not been as bad.  No radiating pain down the arm.  I think I'd like to DN on Thursday.  Hand dominance: Right  PERTINENT HISTORY: Having thyroid biopsy 3/1 Right side Breast Cancer s/p bilateral mastectomy, ACDF with solid fusion 2008, right TKA 2013, left TKA 2014  PAIN:  Are you having pain? Yes: NPRS scale: 1-2/10 Pain location: left thumb, right shoulder, cervical Pain description: sore, sometimes shooting Aggravating factors: certain movements Relieving factors: sometimes Motrin will improve, heat  PRECAUTIONS: Other: s/p bilateral mastectomy  RED FLAGS: None   WEIGHT BEARING RESTRICTIONS: No  FALLS:  Has patient fallen in last 6 months? No  LIVING ENVIRONMENT: Lives with: lives alone Lives in: House/apartment Stairs:  2 story home Has following equipment at home: shower chair  OCCUPATION: Works at home for [employer]  PLOF: Independent, Vocation/Vocational requirements: requires computer/ipad and phone work, and Leisure: traveling/cruising  PATIENT GOALS:  To get some relief and walk away with some exercises that I can continue to do independently.  NEXT MD VISIT: Will follow up with Dr Roda Shutters  OBJECTIVE:  Note: Objective measures  were completed at Evaluation unless otherwise noted.  DIAGNOSTIC FINDINGS:  Cervical Radiograph on 06/16/23: X-rays of the cervical spine show prior ACDF with solid fusion.  Anterior  plate with screws without any complications.   Right shoulder radiograph on 06/16/23: X-rays of the right shoulder shows degenerative arthritis at the [facility] joint.   Otherwise unremarkable.   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   PATIENT SURVEYS:  Eval:  UEFS 28 / 80 = 35.0 %  COGNITION: Overall cognitive status: Within functional limits for tasks assessed     SENSATION: Patient states that she has had some numbness and tingling down her right arm.  POSTURE: Slight forward head and rounded shoulders  UPPER EXTREMITY ROM:   Active ROM Right eval Left eval  Shoulder flexion 123 147  Shoulder extension 45 42  Shoulder abduction 124 144  Shoulder adduction    Shoulder internal rotation    Shoulder external rotation With pain With pain  Elbow flexion    Elbow extension    Wrist flexion    Wrist extension    Wrist ulnar deviation    Wrist radial deviation    Wrist pronation    Wrist supination    (Blank rows = not tested)  UPPER EXTREMITY MMT:  MMT Right eval Left eval  Shoulder flexion 4 5  Shoulder extension    Shoulder abduction 4 5  Shoulder adduction    Shoulder internal rotation 4 4  Shoulder external rotation 4 4  Middle trapezius    Lower trapezius    Elbow flexion    Elbow extension    Wrist flexion    Wrist extension    Wrist ulnar deviation    Wrist radial deviation    Wrist pronation    Wrist supination    Grip strength (lbs) 50 40  (Blank rows = not tested)  SHOULDER SPECIAL TESTS: Impingement tests: Hawkins/Kennedy impingement test: positive  Rotator cuff assessment: Empty can test: positive    PALPATION:  Some tenderness to palpation along right shoulder  TODAY'S TREATMENT  DATE: 07/14/2023 UBE forward only 5 min while discussing status Blue loop around wrists wall slides: up wall 5x, up wall with lift offs (very challenging 5-6/10) Blue loop clocks on wall 5x (very challenging 5-6/10) Green band rows with handles 2x10 (added to HEP) Green band shoulder extension 2x10 (added to HEP) Bicep curls 1# 10x Pronation/supination 1# 10x Wrist extension/flexion 1# 10x Discussion of upcoming thumb surgery and modifications to exercise post-surgically  DATE: 07/03/2023 Supine clasped hand shoulder flexion  Seated upper trap stretch 30 sec hold 2x Standing overhead press alternating 1# 10x right/left (added to HEP) Standing shoulder flexion bil 1# 10x  (pt has pair of 1# weights at home never used) (added to HEP) Manual therapy: soft tissue mobilization to left thumb and right cervical/shoulder muscules Trigger Point Dry Needling Initial Treatment: Pt instructed on Dry Needling rational, procedures, and possible side effects. Pt instructed to expect mild to moderate muscle soreness later in the day and/or into the next day.  Pt instructed in methods to reduce muscle soreness. Pt instructed to continue prescribed HEP. Patient verbalized understanding of these instructions and education.  Patient Verbal Consent Given: Yes Education Handout Provided: Yes Muscles Treated: right supraspinatus; right upper trap (posterior and anterior approach -significant twitch with anterior approach); left abductor pollicis, flexor pollicis brevis, adductor pollicis Electrical Stimulation Performed: No Treatment Response/Outcome: decreased tender points and improved soft tissue mobility on all treated muscles DATE: 07/01/2023 Shoulder pulleys for flexion and abduction x2 min each Seated clasped hands shoulder flexion x30 Seated scapular retraction 2x10 Seated upper  trap stretch x20 sec bilat Seated levator stretch x20 sec bilat Standing right shoulder ER stretch with towel 2x20 sec Standing right shoulder IR stretch with towel 2x20 sec Supine shoulder flexion with 1# weight on cane 2x10 Supine chest press with 1# weight on cane 2x10 Supine serratus punch with 1# weight 2x10 bilat Supine tracing of alphabet with 1# weight A-Z bilat Supine shoulder horizontal abduction with yellow tband 2x10   DATE: 06/24/2023 Supine clasped hands shoulder flexion 10x (added to HEP) Supine dowel external rotation (or clasped hands to top of the head) (added to HEP) Supine dowel horizontal abduction (added to HEP) Thumb circumduction (added to HEP) Seated table flexion slides (added to HEP) Trigger Point Dry Needling Initial Treatment: Pt instructed on Dry Needling rational, procedures, and possible side effects. Pt instructed to expect mild to moderate muscle soreness later in the day and/or into the next day.  Pt instructed in methods to reduce muscle soreness. Pt instructed to continue prescribed HEP. Patient verbalized understanding of these instructions and education.  Patient Verbal Consent Given: Yes Education Handout Provided: Yes Muscles Treated: right upper trap (posterior and anterior approach); left abductor pollicis, flexor pollicis brevis, adductor pollicis Electrical Stimulation Performed: No Treatment Response/Outcome: decreased tender points and improved soft tissue mobility on all treated muscles      DATE: 06/17/2023 Issued and reviewed HEP Discussed role of PT   PATIENT EDUCATION: Education details: Issued HEP Person educated: Patient Education method: Explanation, Facilities manager, and Handouts Education comprehension: verbalized understanding and returned demonstration  HOME EXERCISE PROGRAM: Access Code: [email or web address] URL: [email or web address] Date: 07/14/2023 Prepared by: Lavinia Sharps  Exercises - Seated Scapular  Retraction  - 1 x daily - 7 x weekly - 2 sets - 10 reps - Seated Cervical Retraction  - 1 x daily - 7 x weekly - 2 sets - 10 reps - Seated Upper Trapezius Stretch  - 1 x daily - 7 x  weekly - 2 reps - 20 sec hold - Seated Levator Scapulae Stretch  - 1 x daily - 7 x weekly - 2 reps - 20 sec hold - Seated Shoulder Flexion Towel Slide at Table Top  - 1 x daily - 7 x weekly - 1 sets - 10 reps - Seated Thumb Circumduction AROM  - 1 x daily - 7 x weekly - 2 sets - 5 reps - Supine Shoulder External Rotation with Dowel  - 1 x daily - 7 x weekly - 1 sets - 10 reps - Supine Shoulder Horizontal Abduction Adduction AAROM with Dowel  - 1 x daily - 7 x weekly - 1 sets - 10 reps - Supine Shoulder Flexion Extension AAROM with Dowel  - 1 x daily - 7 x weekly - 2 sets - 10 reps - Supine Shoulder Press AAROM in Abduction with Dowel  - 1 x daily - 7 x weekly - 2 sets - 10 reps - Single Arm Serratus Punches in Supine with Dumbbell  - 1 x daily - 7 x weekly - 2 sets - 10 reps - Supine Shoulder Alphabet  - 1 x daily - 7 x weekly - 1-2 reps - Standing Overhead Press with Dumbbells at [employer]  - 1 x daily - 7 x weekly - 1 sets - 10 reps - Standing Shoulder Flexion to 90 Degrees with Dumbbells  - 1 x daily - 7 x weekly - 1 sets - 10 reps - Standing Row with Anchored Resistance  - 1 x daily - 7 x weekly - 2 sets - 10 reps - Shoulder extension with resistance - Neutral  - 1 x daily - 7 x weekly - 2 sets - 10 reps ASSESSMENT:  CLINICAL IMPRESSION: Patient having thumb surgery next week.  We discussed benefit of bil upper quarter strengthening pre-surgery and modifications for exercise post-operatively. Therapist providing verbal cues to optimize technique with  exercises in order to achieve the greatest benefit.  Quick muscle fatigue with middle, lower trap and serratus ex's on the wall.  HEP continues to be progressed and updated.    OBJECTIVE IMPAIRMENTS: decreased activity tolerance, decreased ROM, decreased strength,  increased muscle spasms, impaired UE functional use, postural dysfunction, and pain.   ACTIVITY LIMITATIONS: carrying, lifting, and sleeping  PARTICIPATION LIMITATIONS: meal prep, cleaning, driving, and community activity  PERSONAL FACTORS: Time since onset of injury/illness/exacerbation and 3+ comorbidities: OA, Hx of breast cancer with bilateral mastectomy, Bilateral TKA  are also affecting patient's functional outcome.   REHAB POTENTIAL: Good  CLINICAL DECISION MAKING: Evolving/moderate complexity  EVALUATION COMPLEXITY: Moderate   GOALS: Goals reviewed with patient? Yes  SHORT TERM GOALS: Target date: 07/10/2023  Patient will be independent with initial HEP. Baseline: Goal status: Met on 07/01/23  2.  Patient will increase right shoulder A/ROM to [facility] to allow her to put items away in overhead cabinets and wash her back. Baseline:  Goal status: Ongoing   LONG TERM GOALS: Target date: 08/14/2023  Patient will be independent with advanced HEP to allow for self progression post discharge. Baseline:  Goal status: Ongoing  2.  Patient will improve Upper Extremity Functional Scale to no greater than 25% to demonstrate improvements with functional tasks. Baseline: 35% Goal status: INITIAL  3.  Patient will increase upper extremity strength to [facility] to allow her to lift heavier items, such as a suitcase without difficulty. Baseline:  Goal status: INITIAL  4.  Patient will report ability to return to driving and turning  to look over her shoulder without increased pain. Baseline:  Goal status: INITIAL  5.  Patient will report ability to complete all work related tasks with 70% decreased pain by end of shift. Baseline:  Goal status: INITIAL   PLAN:  PT FREQUENCY: 2x/week  PT DURATION: 8 weeks  PLANNED INTERVENTIONS: 97164- PT Re-evaluation, 97110-Therapeutic exercises, 97530- Therapeutic activity, 97112- Neuromuscular re-education, 97535- Self Care, 30865- Manual therapy,  [phone]- Aquatic Therapy, [phone]- Electrical stimulation (unattended), [phone]- Electrical stimulation (manual), 97016- Vasopneumatic device, [id number]- Ultrasound, [id number]- Traction (mechanical), [id number]- Ionotophoresis 4mg /ml Dexamethasone, Patient/Family education, Taping, Dry Needling, Joint mobilization, Joint manipulation, Spinal manipulation, Spinal mobilization, Scar mobilization, Cryotherapy, and Moist heat  PLAN FOR NEXT SESSION: check shoulder ROM; seated thoracic extension; Assess and progress HEP as indicated, strengthening, flexibility, manual/dry needling as indicated; has 1# weights at home;  having thumb surgery next Thursday   Lavinia Sharps, PT 07/14/23 8:39 AM Phone: [phone] Fax: [phone]  [facility] [address], [address] [place], [place] [address] Phone # [phone] Fax [phone]

## 2023-07-16 ENCOUNTER — Telehealth: Payer: Self-pay | Admitting: Orthopaedic Surgery

## 2023-07-16 ENCOUNTER — Encounter: Payer: Self-pay | Admitting: Physical Therapy

## 2023-07-16 ENCOUNTER — Ambulatory Visit: Payer: Medicare Other | Admitting: Physical Therapy

## 2023-07-16 DIAGNOSIS — M25511 Pain in right shoulder: Secondary | ICD-10-CM

## 2023-07-16 DIAGNOSIS — M6281 Muscle weakness (generalized): Secondary | ICD-10-CM | POA: Diagnosis not present

## 2023-07-16 DIAGNOSIS — M542 Cervicalgia: Secondary | ICD-10-CM | POA: Diagnosis not present

## 2023-07-16 DIAGNOSIS — M79642 Pain in left hand: Secondary | ICD-10-CM | POA: Diagnosis not present

## 2023-07-16 DIAGNOSIS — R252 Cramp and spasm: Secondary | ICD-10-CM | POA: Diagnosis not present

## 2023-07-16 NOTE — Telephone Encounter (Signed)
 Notified patient.

## 2023-07-16 NOTE — Telephone Encounter (Signed)
 yes

## 2023-07-16 NOTE — Telephone Encounter (Signed)
 Pt called requesting a call back from Lauren G. Pt states she still doing physical therapy and asking if she should stop until after surgery. Pt phone number is [phone].

## 2023-07-16 NOTE — Therapy (Signed)
 OUTPATIENT PHYSICAL THERAPY SHOULDER PROGRESS NOTE/DISCHARGE SUMMARY  Patient Name: [name] MRN: [id number] DOB:26-Nov-1950, 73 y.o., female Today's Date: 07/16/2023  END OF SESSION:  PT End of Session - 07/16/23 0756     Visit Number 6    Date for PT Re-Evaluation 08/14/23    Authorization Type UMR    Progress Note Due on Visit 10    PT Start Time 0758    PT Stop Time 0838    PT Time Calculation (min) 40 min    Activity Tolerance Patient tolerated treatment well             Past Medical History:  Diagnosis Date   Anemia    "years ago"   Arthritis    "left hand; was in my knees" (09/24/2017)   Breast cancer, right breast (HCC) 2004   Bruises easily    Chronic lower back pain    Ductal carcinoma in situ (DCIS) of left breast 2018   Genetic testing 04/17/2017   STAT Breast panel with reflex to Multi-Cancer panel (83 genes) @ [facility] - No pathogenic mutations detected   History of bronchitis    when she was young   Hypertension    no longer taking medications as of August 2019   Hypothyroidism    takes Synthroid daily   Joint pain    Joint swelling    Personal history of chemotherapy 04/2003   Personal history of radiation therapy 2005   Pneumonia    hx of when she was young   Right elbow tendonitis    Sleep apnea    has a machine but does not use (09/24/2017)   Thyroid cancer (HCC) 2012   Urinary frequency    Past Surgical History:  Procedure Laterality Date   ANTERIOR CERVICAL DECOMP/DISCECTOMY FUSION  2008   APPENDECTOMY  2007   BREAST BIOPSY Right 02/28/2003   malignant   BREAST BIOPSY Left 03/2017   BREAST LUMPECTOMY Right 2004   BREAST RECONSTRUCTION WITH PLACEMENT OF TISSUE EXPANDER AND FLEX HD (ACELLULAR HYDRATED DERMIS) Bilateral 06/22/2017   Procedure: IMMEDIATE BILATERAL BREAST RECONSTRUCTION WITH PLACEMENT OF TISSUE EXPANDER AND FLEX HD (ACELLULAR HYDRATED DERMIS);  Surgeon: Peggye Form, DO;  Location: [facility];   Service: Plastics;  Laterality: Bilateral;   CESAREAN SECTION  1981   COLONOSCOPY  X 2   INSERTION OF MESH N/A 02/10/2020   Procedure: INSERTION OF MESH;  Surgeon: Griselda Miner, MD;  Location: [facility] OR;  Service: General;  Laterality: N/A;   KNEE ARTHROSCOPY Right 2007   KNEE ARTHROSCOPY  05/02/2011   Procedure: ARTHROSCOPY KNEE;  Surgeon: Javier Docker;  Location: [facility];  Service: Orthopedics;  Laterality: Right;   LATISSIMUS FLAP TO BREAST Right 09/23/2017   Procedure: RIGHT BREAST LATISSIMUS DORSI FLAP RECONSTRUCTION WITH TISSUE EXPANDER;  Surgeon: Peggye Form, DO;  Location: [facility] OR;  Service: Plastics;  Laterality: Right;   LIPOSUCTION WITH LIPOFILLING Bilateral 02/24/2019   Procedure: Bilateral lipofilling of breasts and liposuction of lateral breasts for symmetry;  Surgeon: Peggye Form, DO;  Location: [facility];  Service: Plastics;  Laterality: Bilateral;  2 hours, please   MASTECTOMY W/ SENTINEL NODE BIOPSY Bilateral 06/22/2017   Procedure: LEFT MASTECTOMY WITH SENTINEL LYMPH NODE BIOPSY AND RIGHT PROPHYLACTIC MASTECTOMY;  Surgeon: Griselda Miner, MD;  Location: [facility];  Service: General;  Laterality: Bilateral;   MENISCUS DEBRIDEMENT  05/02/2011   Procedure: DEBRIDEMENT OF MENISCUS;  Surgeon:  Javier Docker;  Location: [facility];  Service: Orthopedics;  Laterality: Right;   PORTA CATH REMOVAL  2005   PORTACATH PLACEMENT  2004   REMOVAL OF BILATERAL TISSUE EXPANDERS WITH PLACEMENT OF BILATERAL BREAST IMPLANTS Bilateral 05/26/2018   Procedure: REMOVAL OF BILATERAL TISSUE EXPANDERS WITH PLACEMENT OF BILATERAL BREAST IMPLANTS;  Surgeon: Peggye Form, DO;  Location: [facility];  Service: Plastics;  Laterality: Bilateral;   REMOVAL OF TISSUE EXPANDER AND PLACEMENT OF IMPLANT Right 12/12/2017   Procedure: REMOVAL OF TISSUE EXPANDER, RIGHT;  Surgeon: Glenna Fellows, MD;  Location: [facility] OR;   Service: Plastics;  Laterality: Right;   THYROIDECTOMY N/A 09/22/2012   Procedure: RIGHT THYROID LOBECTOMY WITH FROZEN SECTION;  Surgeon: Serena Colonel, MD;  Location: [facility] OR;  Service: ENT;  Laterality: N/A;   THYROIDECTOMY Left 10/21/2012   Procedure: COMPLETION OF THYROIDECTOMY;  Surgeon: Serena Colonel, MD;  Location: [facility] OR;  Service: ENT;  Laterality: Left;   TISSUE EXPANDER PLACEMENT Right 03/03/2018   Procedure: TISSUE EXPANDER PLACEMENT;  Surgeon: Peggye Form, DO;  Location: [facility];  Service: Plastics;  Laterality: Right;   TOTAL KNEE ARTHROPLASTY  04/14/2012   Procedure: TOTAL KNEE ARTHROPLASTY;  Surgeon: Loreta Ave, MD;  Location: [facility] OR;  Service: Orthopedics;  Laterality: Right;  RIGHT ARTHROPLASTY KNEE MEDIAL/LATERAL COMPARTMENTS WITH PATELLA RESURFACING   TOTAL KNEE ARTHROPLASTY Left 03/30/2013   Procedure: TOTAL KNEE ARTHROPLASTY;  Surgeon: Loreta Ave, MD;  Location: [facility] OR;  Service: Orthopedics;  Laterality: Left;   VENTRAL HERNIA REPAIR N/A 02/10/2020   Procedure: LAPAROSCOPIC ASSISTED VENTRAL HERNIA REPAIR;  Surgeon: Griselda Miner, MD;  Location: [facility] OR;  Service: General;  Laterality: N/A;   Patient Active Problem List   Diagnosis Date Noted   Breast asymmetry following reconstructive surgery 05/22/2023   Torn earlobe, left, initial encounter 12/17/2018   S/P breast reconstruction, bilateral 03/16/2018   History of breast cancer in female 01/27/2018   History of reconstruction of both breasts 01/27/2018   Acquired absence of right breast 09/23/2017   Breast cancer (HCC) 06/22/2017   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   Genetic testing 04/17/2017   DJD (degenerative joint disease) of knee 03/30/2013   Witnessed episode of apnea 03/23/2013   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   Weakness 11/28/2011   Hypertension 11/28/2011   Hypokalemia 11/28/2011   LUE weakness 11/28/2011   Hypothyroidism 11/28/2011   Thyroid cancer (HCC)  04/28/2010    PCP: Renaye Rakers, MD  REFERRING PROVIDER: Tarry Kos, MD  REFERRING DIAG: [phone] (ICD-10-CM) - Pain in left hand M25.511,G89.29 (ICD-10-CM) - Chronic right shoulder pain M54.2 (ICD-10-CM) - Neck pain  THERAPY DIAG:  Right shoulder pain, unspecified chronicity  Cervicalgia  Muscle weakness (generalized)  Pain in left hand  Rationale for Evaluation and Treatment: Rehabilitation  ONSET DATE: Left thumb pain for years, Right shoulder pain for at least 2 months  SUBJECTIVE:  SUBJECTIVE STATEMENT: This morning I was wondering how I'm going to drive with one hand. Thumb surgery next week.    Hand dominance: Right  PERTINENT HISTORY: Having thyroid biopsy 3/1 Right side Breast Cancer s/p bilateral mastectomy, ACDF with solid fusion 2008, right TKA 2013, left TKA 2014  PAIN:  Are you having pain? Yes: NPRS scale: 1-2/10 mild upper arm Pain location: left thumb, right shoulder, cervical Pain description: sore, sometimes shooting Aggravating factors: certain movements Relieving factors: sometimes Motrin will improve, heat  PRECAUTIONS: Other: s/p bilateral mastectomy  RED FLAGS: None   WEIGHT BEARING RESTRICTIONS: No  FALLS:  Has patient fallen in last 6 months? No  LIVING ENVIRONMENT: Lives with: lives alone Lives in: House/apartment Stairs:  2 story home Has following equipment at home: shower chair  OCCUPATION: Works at home for [employer]  PLOF: Independent, Vocation/Vocational requirements: requires computer/ipad and phone work, and Leisure: traveling/cruising  PATIENT GOALS:  To get some relief and walk away with some exercises that I can continue to do independently.  NEXT MD VISIT: Will follow up with Dr Roda Shutters  OBJECTIVE:  Note: Objective measures were  completed at Evaluation unless otherwise noted.  DIAGNOSTIC FINDINGS:  Cervical Radiograph on 06/16/23: X-rays of the cervical spine show prior ACDF with solid fusion.  Anterior  plate with screws without any complications.   Right shoulder radiograph on 06/16/23: X-rays of the right shoulder shows degenerative arthritis at the [facility] joint.   Otherwise unremarkable.   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   PATIENT SURVEYS:  Eval:  UEFS 28 / 80 = 35.0 %  COGNITION: Overall cognitive status: Within functional limits for tasks assessed     SENSATION: Patient states that she has had some numbness and tingling down her right arm.  POSTURE: Slight forward head and rounded shoulders  UPPER EXTREMITY ROM:   Active ROM Right eval Left eval 3/20 right  Shoulder flexion 123 147 150  Shoulder extension 45 42   Shoulder abduction 124 144 157  Shoulder adduction     Shoulder internal rotation   sacrum  Shoulder external rotation With pain With pain 72 pain  Elbow flexion     Elbow extension     Wrist flexion     Wrist extension     Wrist ulnar deviation     Wrist radial deviation     Wrist pronation     Wrist supination     (Blank rows = not tested)  UPPER EXTREMITY MMT:  MMT Right eval Left eval  Shoulder flexion 4 5  Shoulder extension    Shoulder abduction 4 5  Shoulder adduction    Shoulder internal rotation 4 4  Shoulder external rotation 4 4  Middle trapezius    Lower trapezius    Elbow flexion    Elbow extension    Wrist flexion    Wrist extension    Wrist ulnar deviation    Wrist radial deviation    Wrist pronation    Wrist supination    Grip strength (lbs) 50 40  (Blank rows = not tested)  SHOULDER SPECIAL TESTS: Impingement tests: Hawkins/Kennedy impingement test: positive  Rotator cuff assessment: Empty can test: positive    PALPATION:  Some tenderness to palpation  along right shoulder  TODAY'S TREATMENT  DATE: 07/16/2023 Shoulder ROM Seated thoracic extension with ball 10x with hands supporting back of the neck (discussed modifications after surgery) Encouraged using her home 1# weights for shoulder/elbow ex prior to surgery Manual therapy: soft tissue mobilization to right cervical/shoulder muscules Trigger Point Dry Needling Subsequent Treatment: Pt instructed on Dry Needling rational, procedures, and possible side effects. Pt instructed to expect mild to moderate muscle soreness later in the day and/or into the next day.  Pt instructed in methods to reduce muscle soreness. Pt instructed to continue prescribed HEP. Patient verbalized understanding of these instructions and education.  Patient Verbal Consent Given: Yes Education Handout Provided: Yes Muscles Treated: right supraspinatus; right infraspinatus, right lateral deltoid;  right upper trap (posterior and anterior approach  Electrical Stimulation Performed: No Treatment Response/Outcome: decreased tender points and improved soft tissue mobility on all treated muscles DATE: 07/14/2023 UBE forward only 5 min while discussing status Blue loop around wrists wall slides: up wall 5x, up wall with lift offs (very challenging 5-6/10) Blue loop clocks on wall 5x (very challenging 5-6/10) Green band rows with handles 2x10 (added to HEP) Green band shoulder extension 2x10 (added to HEP) Bicep curls 1# 10x Pronation/supination 1# 10x Wrist extension/flexion 1# 10x Discussion of upcoming thumb surgery and modifications to exercise post-surgically  DATE: 07/03/2023 Supine clasped hand shoulder flexion  Seated upper trap stretch 30 sec hold 2x Standing overhead press alternating 1# 10x right/left (added to HEP) Standing shoulder flexion bil 1# 10x  (pt has pair of 1# weights at  home never used) (added to HEP) Manual therapy: soft tissue mobilization to left thumb and right cervical/shoulder muscules Trigger Point Dry Needling Initial Treatment: Pt instructed on Dry Needling rational, procedures, and possible side effects. Pt instructed to expect mild to moderate muscle soreness later in the day and/or into the next day.  Pt instructed in methods to reduce muscle soreness. Pt instructed to continue prescribed HEP. Patient verbalized understanding of these instructions and education.  Patient Verbal Consent Given: Yes Education Handout Provided: Yes Muscles Treated: right supraspinatus; right upper trap (posterior and anterior approach -significant twitch with anterior approach); left abductor pollicis, flexor pollicis brevis, adductor pollicis Electrical Stimulation Performed: No Treatment Response/Outcome: decreased tender points and improved soft tissue mobility on all treated muscles DATE: 07/01/2023 Shoulder pulleys for flexion and abduction x2 min each Seated clasped hands shoulder flexion x30 Seated scapular retraction 2x10 Seated upper trap stretch x20 sec bilat Seated levator stretch x20 sec bilat Standing right shoulder ER stretch with towel 2x20 sec Standing right shoulder IR stretch with towel 2x20 sec Supine shoulder flexion with 1# weight on cane 2x10 Supine chest press with 1# weight on cane 2x10 Supine serratus punch with 1# weight 2x10 bilat Supine tracing of alphabet with 1# weight A-Z bilat Supine shoulder horizontal abduction with yellow tband 2x10    PATIENT EDUCATION: Education details: Issued HEP Person educated: Patient Education method: Programmer, multimedia, Facilities manager, and Handouts Education comprehension: verbalized understanding and returned demonstration  HOME EXERCISE PROGRAM: Access Code: [email or web address] URL: [email or web address] Date: 07/16/2023 Prepared by: Lavinia Sharps  Exercises - Seated Scapular Retraction  - 1 x  daily - 7 x weekly - 2 sets - 10 reps - Seated Cervical Retraction  - 1 x daily - 7 x weekly - 2 sets - 10 reps - Seated Upper Trapezius Stretch  - 1 x daily - 7 x weekly - 2 reps - 20 sec hold - Seated Levator Scapulae Stretch  - 1 x daily -  7 x weekly - 2 reps - 20 sec hold - Seated Shoulder Flexion Towel Slide at Table Top  - 1 x daily - 7 x weekly - 1 sets - 10 reps - Seated Thumb Circumduction AROM  - 1 x daily - 7 x weekly - 2 sets - 5 reps - Supine Shoulder External Rotation with Dowel  - 1 x daily - 7 x weekly - 1 sets - 10 reps - Supine Shoulder Horizontal Abduction Adduction AAROM with Dowel  - 1 x daily - 7 x weekly - 1 sets - 10 reps - Supine Shoulder Flexion Extension AAROM with Dowel  - 1 x daily - 7 x weekly - 2 sets - 10 reps - Supine Shoulder Press AAROM in Abduction with Dowel  - 1 x daily - 7 x weekly - 2 sets - 10 reps - Single Arm Serratus Punches in Supine with Dumbbell  - 1 x daily - 7 x weekly - 2 sets - 10 reps - Supine Shoulder Alphabet  - 1 x daily - 7 x weekly - 1-2 reps - Standing Overhead Press with Dumbbells at [employer]  - 1 x daily - 7 x weekly - 1 sets - 10 reps - Standing Shoulder Flexion to 90 Degrees with Dumbbells  - 1 x daily - 7 x weekly - 1 sets - 10 reps - Standing Row with Anchored Resistance  - 1 x daily - 7 x weekly - 2 sets - 10 reps - Shoulder extension with resistance - Neutral  - 1 x daily - 7 x weekly - 2 sets - 10 reps - Seated Thoracic Lumbar Extension with Pectoralis Stretch  - 1 x daily - 7 x weekly - 1 sets - 10 reps ASSESSMENT:  CLINICAL IMPRESSION: The patient has much improved shoulder ROM since start of care with decreasing symptom intensity. She benefits  from dry needling and manual therapy to stimulate underlying myofascial trigger points and muscular tissue for management of neuromusculoskeletal pain and address movement impairments.  Discussion on HEP and modifications for post thumb surgery.       OBJECTIVE IMPAIRMENTS: decreased  activity tolerance, decreased ROM, decreased strength, increased muscle spasms, impaired UE functional use, postural dysfunction, and pain.   ACTIVITY LIMITATIONS: carrying, lifting, and sleeping  PARTICIPATION LIMITATIONS: meal prep, cleaning, driving, and community activity  PERSONAL FACTORS: Time since onset of injury/illness/exacerbation and 3+ comorbidities: OA, Hx of breast cancer with bilateral mastectomy, Bilateral TKA  are also affecting patient's functional outcome.   REHAB POTENTIAL: Good  CLINICAL DECISION MAKING: Evolving/moderate complexity  EVALUATION COMPLEXITY: Moderate   GOALS: Goals reviewed with patient? Yes  SHORT TERM GOALS: Target date: 07/10/2023  Patient will be independent with initial HEP. Baseline: Goal status: Met on 07/01/23  2.  Patient will increase right shoulder A/ROM to [facility] to allow her to put items away in overhead cabinets and wash her back. Baseline:  Goal status: Ongoing   LONG TERM GOALS: Target date: 08/14/2023  Patient will be independent with advanced HEP to allow for self progression post discharge. Baseline:  Goal status: Ongoing  2.  Patient will improve Upper Extremity Functional Scale to no greater than 25% to demonstrate improvements with functional tasks. Baseline: 35% Goal status: INITIAL  3.  Patient will increase upper extremity strength to [facility] to allow her to lift heavier items, such as a suitcase without difficulty. Baseline:  Goal status: INITIAL  4.  Patient will report ability to return to driving and turning to  look over her shoulder without increased pain. Baseline:  Goal status: INITIAL  5.  Patient will report ability to complete all work related tasks with 70% decreased pain by end of shift. Baseline:  Goal status: INITIAL   PLAN:  PT FREQUENCY: 2x/week  PT DURATION: 8 weeks  PLANNED INTERVENTIONS: 97164- PT Re-evaluation, 97110-Therapeutic exercises, 97530- Therapeutic activity, 97112- Neuromuscular  re-education, 97535- Self Care, 52841- Manual therapy, [phone]- Aquatic Therapy, [phone]- Electrical stimulation (unattended), [phone]- Electrical stimulation (manual), 97016- Vasopneumatic device, [id number]- Ultrasound, [id number]- Traction (mechanical), [id number]- Ionotophoresis 4mg /ml Dexamethasone, Patient/Family education, Taping, Dry Needling, Joint mobilization, Joint manipulation, Spinal manipulation, Spinal mobilization, Scar mobilization, Cryotherapy, and Moist heat  PLAN FOR NEXT SESSION: seated thoracic extension; Assess and progress HEP as indicated, strengthening, flexibility, manual/dry needling as indicated; has 1# weights at home;  having thumb surgery next Thursday  PHYSICAL THERAPY DISCHARGE SUMMARY  Visits from Start of Care: 6  Current functional level related to goals / functional outcomes: Patient called to request discharge per Dr. Roda Shutters secondary to upcoming thumb surgery. She will be referred to OT post op.    Remaining deficits: As above   Education / Equipment: HEP   Patient agrees to discharge. Patient goals were partially met. Patient is being discharged due to the physician's request.    Lavinia Sharps, PT 07/16/23 7:54 PM Phone: [phone] Fax: [phone]  [facility] [address] [place], [place] [address] Phone # [phone] Fax [phone]

## 2023-07-19 DIAGNOSIS — H524 Presbyopia: Secondary | ICD-10-CM | POA: Diagnosis not present

## 2023-07-19 DIAGNOSIS — H2513 Age-related nuclear cataract, bilateral: Secondary | ICD-10-CM | POA: Diagnosis not present

## 2023-07-21 ENCOUNTER — Ambulatory Visit: Payer: Medicare Other | Admitting: Physical Therapy

## 2023-07-21 ENCOUNTER — Other Ambulatory Visit: Payer: Self-pay | Admitting: Physician Assistant

## 2023-07-21 MED ORDER — HYDROCODONE-ACETAMINOPHEN 5-325 MG PO TABS: 1.0000 | ORAL_TABLET | Freq: Three times a day (TID) | ORAL | 0 refills | Status: AC | PRN

## 2023-07-21 MED ORDER — ONDANSETRON HCL 4 MG PO TABS: 4.0000 mg | ORAL_TABLET | Freq: Three times a day (TID) | ORAL | 0 refills | Status: AC | PRN

## 2023-07-23 ENCOUNTER — Encounter: Payer: Medicare Other | Admitting: Physical Therapy

## 2023-07-23 DIAGNOSIS — G8918 Other acute postprocedural pain: Secondary | ICD-10-CM | POA: Diagnosis not present

## 2023-07-23 DIAGNOSIS — M1812 Unilateral primary osteoarthritis of first carpometacarpal joint, left hand: Secondary | ICD-10-CM | POA: Diagnosis not present

## 2023-07-24 ENCOUNTER — Ambulatory Visit: Payer: Medicare Other | Admitting: Physical Therapy

## 2023-07-28 ENCOUNTER — Encounter: Payer: Medicare Other | Admitting: Physical Therapy

## 2023-07-30 ENCOUNTER — Encounter: Payer: Medicare Other | Admitting: Physical Therapy

## 2023-07-30 ENCOUNTER — Encounter: Admitting: Physician Assistant

## 2023-07-31 ENCOUNTER — Other Ambulatory Visit (INDEPENDENT_AMBULATORY_CARE_PROVIDER_SITE_OTHER): Payer: Self-pay

## 2023-07-31 ENCOUNTER — Ambulatory Visit (INDEPENDENT_AMBULATORY_CARE_PROVIDER_SITE_OTHER): Admitting: Physician Assistant

## 2023-07-31 DIAGNOSIS — M1812 Unilateral primary osteoarthritis of first carpometacarpal joint, left hand: Secondary | ICD-10-CM

## 2023-07-31 NOTE — Progress Notes (Signed)
 Post-Op Visit Note   Patient: [name]           Date of Birth: May 16, 1950           MRN: [id number] Visit Date: 07/31/2023 PCP: Renaye Rakers, MD   Assessment & Plan:  Chief Complaint:  Chief Complaint  Patient presents with   Left Thumb - Pain   Visit Diagnoses:  1. Arthritis of carpometacarpal [facility]) joint of left thumb     Plan: Patient is a pleasant 73 year old female who comes in today 1 week status post left thumb CMC arthroplasty 07/23/2023.  She has been compliant wearing a thumb spica splint.  She is in no pain.  She did note some paresthesias to the palm of her hand yesterday but nothing into the fingers.  Overall, doing very well.  Examination of the left hand: Well-fitting thumb spica splint.  Fingers warm well-perfused.  She is neurovascular intact distally.  Today, new Ace wrap was applied to the splint.  X-rays are stable.  She will follow-up next week for suture removal and will transition to a Velcro thumb spica splint and put in an order for OT to be fitted for a thermoplastic splint and to start OT at 4 weeks po.  Follow-Up Instructions: Return in about 1 week (around 08/07/2023).   Orders:  Orders Placed This Encounter  Procedures   XR Hand Complete Left   No orders of the defined types were placed in this encounter.   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   Genetic testing 04/17/2017   DJD (degenerative joint disease)  of knee 03/30/2013   Witnessed episode of apnea 03/23/2013   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   Weakness 11/28/2011   Hypertension 11/28/2011   Hypokalemia 11/28/2011   LUE weakness 11/28/2011   Hypothyroidism 11/28/2011   Thyroid cancer (HCC) 04/28/2010   Past Medical History:  Diagnosis Date   Anemia    "years ago"   Arthritis    "left hand; was in my knees" (09/24/2017)   Breast cancer, right breast (HCC) 2004   Bruises easily    Chronic lower back pain    Ductal carcinoma in situ (DCIS) of left breast 2018   Genetic testing 04/17/2017   STAT Breast panel with reflex to Multi-Cancer panel (83 genes) @ [facility] - No pathogenic mutations detected   History of bronchitis    when she was young   Hypertension    no longer taking medications as of August 2019   Hypothyroidism    takes Synthroid daily   Joint pain    Joint swelling    Personal history of chemotherapy 04/2003   Personal history of radiation therapy 2005   Pneumonia    hx of when she was young   Right elbow tendonitis    Sleep apnea    has a machine but does not use (09/24/2017)   Thyroid cancer (HCC) 2012   Urinary frequency     Family History  Problem Relation  Age of Onset   Breast cancer Mother 33       currently 80   Liver cancer Father    Breast cancer Other        pat grandfather's sister; dx 33s    Past Surgical History:  Procedure Laterality Date   ANTERIOR CERVICAL DECOMP/DISCECTOMY FUSION  2008   APPENDECTOMY  2007   BREAST BIOPSY Right 02/28/2003   malignant   BREAST BIOPSY Left 03/2017   BREAST LUMPECTOMY Right 2004   BREAST RECONSTRUCTION WITH PLACEMENT OF TISSUE EXPANDER AND FLEX HD (ACELLULAR HYDRATED DERMIS) Bilateral 06/22/2017   Procedure: IMMEDIATE BILATERAL BREAST RECONSTRUCTION WITH PLACEMENT OF TISSUE EXPANDER AND FLEX HD (ACELLULAR HYDRATED DERMIS);  Surgeon: Peggye Form, DO;  Location: [facility];  Service: Plastics;  Laterality: Bilateral;   CESAREAN SECTION   1981   COLONOSCOPY  X 2   INSERTION OF MESH N/A 02/10/2020   Procedure: INSERTION OF MESH;  Surgeon: Griselda Miner, MD;  Location: [facility] OR;  Service: General;  Laterality: N/A;   KNEE ARTHROSCOPY Right 2007   KNEE ARTHROSCOPY  05/02/2011   Procedure: ARTHROSCOPY KNEE;  Surgeon: Javier Docker;  Location: [facility];  Service: Orthopedics;  Laterality: Right;   LATISSIMUS FLAP TO BREAST Right 09/23/2017   Procedure: RIGHT BREAST LATISSIMUS DORSI FLAP RECONSTRUCTION WITH TISSUE EXPANDER;  Surgeon: Peggye Form, DO;  Location: [facility];  Service: Plastics;  Laterality: Right;   LIPOSUCTION WITH LIPOFILLING Bilateral 02/24/2019   Procedure: Bilateral lipofilling of breasts and liposuction of lateral breasts for symmetry;  Surgeon: Peggye Form, DO;  Location: [facility];  Service: Plastics;  Laterality: Bilateral;  2 hours, please   MASTECTOMY W/ SENTINEL NODE BIOPSY Bilateral 06/22/2017   Procedure: LEFT MASTECTOMY WITH SENTINEL LYMPH NODE BIOPSY AND RIGHT PROPHYLACTIC MASTECTOMY;  Surgeon: Griselda Miner, MD;  Location: [facility];  Service: General;  Laterality: Bilateral;   MENISCUS DEBRIDEMENT  05/02/2011   Procedure: DEBRIDEMENT OF MENISCUS;  Surgeon: Javier Docker;  Location: [facility];  Service: Orthopedics;  Laterality: Right;   PORTA CATH REMOVAL  2005   PORTACATH PLACEMENT  2004   REMOVAL OF BILATERAL TISSUE EXPANDERS WITH PLACEMENT OF BILATERAL BREAST IMPLANTS Bilateral 05/26/2018   Procedure: REMOVAL OF BILATERAL TISSUE EXPANDERS WITH PLACEMENT OF BILATERAL BREAST IMPLANTS;  Surgeon: Peggye Form, DO;  Location: [facility];  Service: Plastics;  Laterality: Bilateral;   REMOVAL OF TISSUE EXPANDER AND PLACEMENT OF IMPLANT Right 12/12/2017   Procedure: REMOVAL OF TISSUE EXPANDER, RIGHT;  Surgeon: Glenna Fellows, MD;  [facility];  Service: Plastics;  Laterality: Right;   THYROIDECTOMY N/A  09/22/2012   Procedure: RIGHT THYROID LOBECTOMY WITH FROZEN SECTION;  Surgeon: Serena Colonel, MD;  Location: [facility] OR;  Service: ENT;  Laterality: N/A;   THYROIDECTOMY Left 10/21/2012   Procedure: COMPLETION OF THYROIDECTOMY;  Surgeon: Serena Colonel, MD;  Location: [facility] OR;  Service: ENT;  Laterality: Left;   TISSUE EXPANDER PLACEMENT Right 03/03/2018   Procedure: TISSUE EXPANDER PLACEMENT;  Surgeon: Peggye Form, DO;  Location: [facility];  Service: Plastics;  Laterality: Right;   TOTAL KNEE ARTHROPLASTY  04/14/2012   Procedure: TOTAL KNEE ARTHROPLASTY;  Surgeon: Loreta Ave, MD;  Location: [facility] OR;  Service: Orthopedics;  Laterality: Right;  RIGHT ARTHROPLASTY KNEE MEDIAL/LATERAL COMPARTMENTS WITH PATELLA RESURFACING   TOTAL KNEE ARTHROPLASTY Left 03/30/2013   Procedure: TOTAL KNEE ARTHROPLASTY;  Surgeon: Loreta Ave,  MD;  Location[facility];  Service: Orthopedics;  Laterality: Left;   VENTRAL HERNIA REPAIR N/A 02/10/2020   Procedure: LAPAROSCOPIC ASSISTED VENTRAL HERNIA REPAIR;  Surgeon: Griselda Miner, MD;  [facility];  Service: General;  Laterality: N/A;   Social History   Occupational History   Not on file  Tobacco Use   Smoking status: Never   Smokeless tobacco: Never  Vaping Use   Vaping status: Never Used  Substance and Sexual Activity   Alcohol use: Yes    Comment: occasional   Drug use: Not Currently   Sexual activity: Not Currently    Birth control/protection: Post-menopausal

## 2023-08-04 ENCOUNTER — Encounter: Payer: Medicare Other | Admitting: Physical Therapy

## 2023-08-05 ENCOUNTER — Ambulatory Visit (INDEPENDENT_AMBULATORY_CARE_PROVIDER_SITE_OTHER): Admitting: Physician Assistant

## 2023-08-05 ENCOUNTER — Encounter: Admitting: Physician Assistant

## 2023-08-05 DIAGNOSIS — M1812 Unilateral primary osteoarthritis of first carpometacarpal joint, left hand: Secondary | ICD-10-CM

## 2023-08-05 NOTE — Progress Notes (Signed)
 Post-Op Visit Note   Patient: [name]           Date of Birth: 09/30/1950           MRN: [id number] Visit Date: 08/05/2023 PCP: Renaye Rakers, MD   Assessment & Plan:  Chief Complaint:  Chief Complaint  Patient presents with   Left Thumb - Pain   Visit Diagnoses:  1. Arthritis of carpometacarpal [facility]) joint of left thumb     Plan: Patient is a pleasant 73 year old female who comes in today 2 weeks status post left thumb CMC arthroplasty 07/23/2023.  She has been doing great.  Very minimal pain which is relieved with over-the-counter Tylenol/NSAIDs.  Examination of her left hand out of the splint reveals well-healing surgical incisions with nylon sutures in place.  No evidence of infection or cellulitis.  Fingers are warm and well-perfused.  She is neurovascularly intact distally.  Today, her sutures were removed and Steri-Strips applied.  She was transition to a Velcro thumb spica splint.  I have sent in a referral to occupational therapy where they will mold a thermoplastic splint and start exercises at 4 weeks postop.  She will follow-up with [place] in 4 weeks for [facility] evaluation.  Call with concerns or questions.  Follow-Up Instructions: Return in about 4 weeks (around 09/02/2023).   Orders:  Orders Placed This Encounter  Procedures   Ambulatory referral to Occupational Therapy   No orders of the defined types were placed in this encounter.   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   Genetic testing 04/17/2017   DJD  (degenerative joint disease) of knee 03/30/2013   Witnessed episode of apnea 03/23/2013   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   Weakness 11/28/2011   Hypertension 11/28/2011   Hypokalemia 11/28/2011   LUE weakness 11/28/2011   Hypothyroidism 11/28/2011   Thyroid cancer (HCC) 04/28/2010   Past Medical History:  Diagnosis Date   Anemia    "years ago"   Arthritis    "left hand; was in my knees" (09/24/2017)   Breast cancer, right breast (HCC) 2004   Bruises easily    Chronic lower back pain    Ductal carcinoma in situ (DCIS) of left breast 2018   Genetic testing 04/17/2017   STAT Breast panel with reflex to Multi-Cancer panel (83 genes) @ [facility] - No pathogenic mutations detected   History of bronchitis    when she was young   Hypertension    no longer taking medications as of August 2019   Hypothyroidism    takes Synthroid daily   Joint pain    Joint swelling    Personal history of chemotherapy 04/2003   Personal history of radiation therapy 2005   Pneumonia    hx of when she was young   Right elbow tendonitis    Sleep apnea    has a machine but does not use (09/24/2017)   Thyroid cancer (HCC) 2012   Urinary frequency     Family History  Problem Relation Age of Onset   Breast cancer Mother  72       currently 85   Liver cancer Father    Breast cancer Other        pat grandfather's sister; dx 44s    Past Surgical History:  Procedure Laterality Date   ANTERIOR CERVICAL DECOMP/DISCECTOMY FUSION  2008   APPENDECTOMY  2007   BREAST BIOPSY Right 02/28/2003   malignant   BREAST BIOPSY Left 03/2017   BREAST LUMPECTOMY Right 2004   BREAST RECONSTRUCTION WITH PLACEMENT OF TISSUE EXPANDER AND FLEX HD (ACELLULAR HYDRATED DERMIS) Bilateral 06/22/2017   Procedure: IMMEDIATE BILATERAL BREAST RECONSTRUCTION WITH PLACEMENT OF TISSUE EXPANDER AND FLEX HD (ACELLULAR HYDRATED DERMIS);  Surgeon: Peggye Form, DO;  Location: [facility];  Service: Plastics;  Laterality:  Bilateral;   CESAREAN SECTION  1981   COLONOSCOPY  X 2   INSERTION OF MESH N/A 02/10/2020   Procedure: INSERTION OF MESH;  Surgeon: Griselda Miner, MD;  Location: [facility] OR;  Service: General;  Laterality: N/A;   KNEE ARTHROSCOPY Right 2007   KNEE ARTHROSCOPY  05/02/2011   Procedure: ARTHROSCOPY KNEE;  Surgeon: Javier Docker;  Location: [facility];  Service: Orthopedics;  Laterality: Right;   LATISSIMUS FLAP TO BREAST Right 09/23/2017   Procedure: RIGHT BREAST LATISSIMUS DORSI FLAP RECONSTRUCTION WITH TISSUE EXPANDER;  Surgeon: Peggye Form, DO;  Location[facility];  Service: Plastics;  Laterality: Right;   LIPOSUCTION WITH LIPOFILLING Bilateral 02/24/2019   Procedure: Bilateral lipofilling of breasts and liposuction of lateral breasts for symmetry;  Surgeon: Peggye Form, DO;  Location: [facility];  Service: Plastics;  Laterality: Bilateral;  2 hours, please   MASTECTOMY W/ SENTINEL NODE BIOPSY Bilateral 06/22/2017   Procedure: LEFT MASTECTOMY WITH SENTINEL LYMPH NODE BIOPSY AND RIGHT PROPHYLACTIC MASTECTOMY;  Surgeon: Griselda Miner, MD;  Location: [facility];  Service: General;  Laterality: Bilateral;   MENISCUS DEBRIDEMENT  05/02/2011   Procedure: DEBRIDEMENT OF MENISCUS;  Surgeon: Javier Docker;  Location: [facility];  Service: Orthopedics;  Laterality: Right;   PORTA CATH REMOVAL  2005   PORTACATH PLACEMENT  2004   REMOVAL OF BILATERAL TISSUE EXPANDERS WITH PLACEMENT OF BILATERAL BREAST IMPLANTS Bilateral 05/26/2018   Procedure: REMOVAL OF BILATERAL TISSUE EXPANDERS WITH PLACEMENT OF BILATERAL BREAST IMPLANTS;  Surgeon: Peggye Form, DO;  Location: [facility];  Service: Plastics;  Laterality: Bilateral;   REMOVAL OF TISSUE EXPANDER AND PLACEMENT OF IMPLANT Right 12/12/2017   Procedure: REMOVAL OF TISSUE EXPANDER, RIGHT;  Surgeon: Glenna Fellows, MD;  Location[facility];  Service: Plastics;   Laterality: Right;   THYROIDECTOMY N/A 09/22/2012   Procedure: RIGHT THYROID LOBECTOMY WITH FROZEN SECTION;  Surgeon: Serena Colonel, MD;  Location: [facility] OR;  Service: ENT;  Laterality: N/A;   THYROIDECTOMY Left 10/21/2012   Procedure: COMPLETION OF THYROIDECTOMY;  Surgeon: Serena Colonel, MD;  Location: [facility] OR;  Service: ENT;  Laterality: Left;   TISSUE EXPANDER PLACEMENT Right 03/03/2018   Procedure: TISSUE EXPANDER PLACEMENT;  Surgeon: Peggye Form, DO;  Location: [facility];  Service: Plastics;  Laterality: Right;   TOTAL KNEE ARTHROPLASTY  04/14/2012   Procedure: TOTAL KNEE ARTHROPLASTY;  Surgeon: Loreta Ave, MD;  Location: [facility] OR;  Service: Orthopedics;  Laterality: Right;  RIGHT ARTHROPLASTY KNEE MEDIAL/LATERAL COMPARTMENTS WITH PATELLA RESURFACING   TOTAL KNEE ARTHROPLASTY Left 03/30/2013   Procedure: TOTAL KNEE ARTHROPLASTY;  Surgeon: Loreta Ave, MD;  Location: [facility] OR;  Service: Orthopedics;  Laterality: Left;   VENTRAL HERNIA REPAIR N/A 02/10/2020   Procedure: LAPAROSCOPIC ASSISTED VENTRAL HERNIA REPAIR;  Surgeon: Griselda Miner, MD;  Location: [facility];  Service: General;  Laterality: N/A;   Social History   Occupational History   Not on file  Tobacco Use   Smoking status: Never   Smokeless tobacco: Never  Vaping Use   Vaping status: Never Used  Substance and Sexual Activity   Alcohol use: Yes    Comment: occasional   Drug use: Not Currently   Sexual activity: Not Currently    Birth control/protection: Post-menopausal

## 2023-08-06 ENCOUNTER — Encounter: Payer: Medicare Other | Admitting: Physical Therapy

## 2023-08-11 ENCOUNTER — Encounter: Payer: Medicare Other | Admitting: Rehabilitative and Restorative Service Providers"

## 2023-08-12 NOTE — Therapy (Signed)
 OUTPATIENT OCCUPATIONAL THERAPY ORTHO EVALUATION  Patient Name: [name] MRN: [id number] DOB:23-Oct-1950, 73 y.o., female Today's Date: 08/13/2023  PCP: Lowella Bandy MD REFERRING PROVIDER: Cristie Hem, PA-C   END OF SESSION:  OT End of Session - 08/13/23 0804     Visit Number 1    Number of Visits 7    Date for OT Re-Evaluation 09/25/23    Authorization Type [employer]    OT Start Time 0804    OT Stop Time 0851    OT Time Calculation (min) 47 min    Equipment Utilized During Treatment Orthotic materials    Activity Tolerance Patient tolerated treatment well;No increased pain;Patient limited by fatigue;Patient limited by pain    Behavior During Therapy [facility] for tasks assessed/performed             Past Medical History:  Diagnosis Date   Anemia    "years ago"   Arthritis    "left hand; was in my knees" (09/24/2017)   Breast cancer, right breast (HCC) 2004   Bruises easily    Chronic lower back pain    Ductal carcinoma in situ (DCIS) of left breast 2018   Genetic testing 04/17/2017   STAT Breast panel with reflex to Multi-Cancer panel (83 genes) @ [facility] - No pathogenic mutations detected   History of bronchitis    when she was young   Hypertension    no longer taking medications as of August 2019   Hypothyroidism    takes Synthroid daily   Joint pain    Joint swelling    Personal history of chemotherapy 04/2003   Personal history of radiation therapy 2005   Pneumonia    hx of when she was young   Right elbow tendonitis    Sleep apnea    has a machine but does not use (09/24/2017)   Thyroid cancer (HCC) 2012   Urinary frequency    Past Surgical History:  Procedure Laterality Date   ANTERIOR CERVICAL DECOMP/DISCECTOMY FUSION  2008   APPENDECTOMY  2007   BREAST BIOPSY Right 02/28/2003   malignant   BREAST BIOPSY Left 03/2017   BREAST LUMPECTOMY Right 2004   BREAST RECONSTRUCTION WITH PLACEMENT OF TISSUE EXPANDER AND FLEX HD (ACELLULAR HYDRATED DERMIS)  Bilateral 06/22/2017   Procedure: IMMEDIATE BILATERAL BREAST RECONSTRUCTION WITH PLACEMENT OF TISSUE EXPANDER AND FLEX HD (ACELLULAR HYDRATED DERMIS);  Surgeon: Peggye Form, DO;  Location: [facility];  Service: Plastics;  Laterality: Bilateral;   CESAREAN SECTION  1981   COLONOSCOPY  X 2   INSERTION OF MESH N/A 02/10/2020   Procedure: INSERTION OF MESH;  Surgeon: Griselda Miner, MD;  Location: [facility] OR;  Service: General;  Laterality: N/A;   KNEE ARTHROSCOPY Right 2007   KNEE ARTHROSCOPY  05/02/2011   Procedure: ARTHROSCOPY KNEE;  Surgeon: Javier Docker;  Location: [facility];  Service: Orthopedics;  Laterality: Right;   LATISSIMUS FLAP TO BREAST Right 09/23/2017   Procedure: RIGHT BREAST LATISSIMUS DORSI FLAP RECONSTRUCTION WITH TISSUE EXPANDER;  Surgeon: Peggye Form, DO;  Location: [facility] OR;  Service: Plastics;  Laterality: Right;   LIPOSUCTION WITH LIPOFILLING Bilateral 02/24/2019   Procedure: Bilateral lipofilling of breasts and liposuction of lateral breasts for symmetry;  Surgeon: Peggye Form, DO;  Location: [facility];  Service: Plastics;  Laterality: Bilateral;  2 hours, please   MASTECTOMY W/ SENTINEL NODE BIOPSY Bilateral 06/22/2017   Procedure: LEFT MASTECTOMY WITH SENTINEL LYMPH NODE BIOPSY AND  RIGHT PROPHYLACTIC MASTECTOMY;  Surgeon: Griselda Miner, MD;  Location: [facility];  Service: General;  Laterality: Bilateral;   MENISCUS DEBRIDEMENT  05/02/2011   Procedure: DEBRIDEMENT OF MENISCUS;  Surgeon: Javier Docker;  Location: [facility];  Service: Orthopedics;  Laterality: Right;   PORTA CATH REMOVAL  2005   PORTACATH PLACEMENT  2004   REMOVAL OF BILATERAL TISSUE EXPANDERS WITH PLACEMENT OF BILATERAL BREAST IMPLANTS Bilateral 05/26/2018   Procedure: REMOVAL OF BILATERAL TISSUE EXPANDERS WITH PLACEMENT OF BILATERAL BREAST IMPLANTS;  Surgeon: Peggye Form, DO;  Location: [facility];  Service: Plastics;  Laterality: Bilateral;   REMOVAL OF TISSUE EXPANDER AND PLACEMENT OF IMPLANT Right 12/12/2017   Procedure: REMOVAL OF TISSUE EXPANDER, RIGHT;  Surgeon: Glenna Fellows, MD;  Location: [facility] OR;  Service: Plastics;  Laterality: Right;   THYROIDECTOMY N/A 09/22/2012   Procedure: RIGHT THYROID LOBECTOMY WITH FROZEN SECTION;  Surgeon: Serena Colonel, MD;  Location: [facility] OR;  Service: ENT;  Laterality: N/A;   THYROIDECTOMY Left 10/21/2012   Procedure: COMPLETION OF THYROIDECTOMY;  Surgeon: Serena Colonel, MD;  Location: [facility] OR;  Service: ENT;  Laterality: Left;   TISSUE EXPANDER PLACEMENT Right 03/03/2018   Procedure: TISSUE EXPANDER PLACEMENT;  Surgeon: Peggye Form, DO;  Location: [facility];  Service: Plastics;  Laterality: Right;   TOTAL KNEE ARTHROPLASTY  04/14/2012   Procedure: TOTAL KNEE ARTHROPLASTY;  Surgeon: Loreta Ave, MD;  Location[facility];  Service: Orthopedics;  Laterality: Right;  RIGHT ARTHROPLASTY KNEE MEDIAL/LATERAL COMPARTMENTS WITH PATELLA RESURFACING   TOTAL KNEE ARTHROPLASTY Left 03/30/2013   Procedure: TOTAL KNEE ARTHROPLASTY;  Surgeon: Loreta Ave, MD;  Location: [facility] OR;  Service: Orthopedics;  Laterality: Left;   VENTRAL HERNIA REPAIR N/A 02/10/2020   Procedure: LAPAROSCOPIC ASSISTED VENTRAL HERNIA REPAIR;  Surgeon: Griselda Miner, MD;  Location: [facility] OR;  Service: General;  Laterality: N/A;   Patient Active Problem List   Diagnosis Date Noted   Breast asymmetry following reconstructive surgery 05/22/2023   Torn earlobe, left, initial encounter 12/17/2018   S/P breast reconstruction, bilateral 03/16/2018   History of breast cancer in female 01/27/2018   History of reconstruction of both breasts 01/27/2018   Acquired absence of right breast 09/23/2017   Breast cancer (HCC) 06/22/2017   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   Genetic testing 04/17/2017   DJD (degenerative joint disease) of knee  03/30/2013   Witnessed episode of apnea 03/23/2013   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   Weakness 11/28/2011   Hypertension 11/28/2011   Hypokalemia 11/28/2011   LUE weakness 11/28/2011   Hypothyroidism 11/28/2011   Thyroid cancer (HCC) 04/28/2010    ONSET DATE: DOS 07/23/23  REFERRING DIAG: Z61.09 (ICD-10-CM) - Arthritis of carpometacarpal (CMC) joint of left thumb   THERAPY DIAG:  Pain in left hand - Plan: Ot plan of care cert/re-cert  Localized edema - Plan: Ot plan of care cert/re-cert  Muscle weakness (generalized) - Plan: Ot plan of care cert/re-cert  Rationale for Evaluation and Treatment: Rehabilitation  SUBJECTIVE:   SUBJECTIVE STATEMENT: ~3 weeks s/p Lt thumb CMC J arthroplasty. She states working from home for [employer], but will retire in May. She states no significant pain now.      PERTINENT HISTORY: Arthritis, breast cancer survivor, chronic low back pain, history of thyroid issues and cancer as well.  PRECAUTIONS: None  RED FLAGS: None   WEIGHT BEARING RESTRICTIONS: Yes no weightbearing in the left  hand and thumb now  PAIN:  Are you having pain? Yes: NPRS scale: none now but up to 3/10 at worst in past week  Pain location: Left thumb and wrist Pain description: Aching and sore Aggravating factors: Motion and weightbearing Relieving factors: Rest  FALLS: Has patient fallen in last 6 months? No  PLOF: Independent  PATIENT GOALS: Improve use of left hand and arm safely to return to normal activities  NEXT MD VISIT: As needed    OBJECTIVE: (All objective assessments below are from initial evaluation on: 08/13/23 unless otherwise specified.)   HAND DOMINANCE: Right   ADLs: Overall ADLs: States decreased ability to grab, hold household objects, pain and difficulty to open containers, perform FMS tasks (manipulate fasteners on clothing), mild to moderate bathing problems as well.    FUNCTIONAL OUTCOME MEASURES: Eval: Patient Specific Functional Scale: 5.3  (grab objects, dressed, open a jar)  (Higher Score  =  Better Ability for the Selected Tasks)       UPPER EXTREMITY ROM     Shoulder to Wrist AROM Left eval  Shoulder flexion   Shoulder abduction   Shoulder extension   Shoulder internal rotation   Shoulder external rotation   Elbow flexion   Elbow extension   Forearm supination 77  Forearm pronation  80  Wrist flexion 45  Wrist extension 49  Wrist ulnar deviation   Wrist radial deviation   Functional dart thrower's motion (F-DTM) in ulnar flexion   F-DTM in radial extension    (Blank rows = not tested)   Hand AROM Left eval  Full Fist Ability (or Gap to Distal Palmar Crease) Full but loose   Thumb Opposition  (Kapandji Scale)    Thumb MCP (0-60) 0- 27  Thumb IP (0-80) +15 - 20  Thumb Radial Abduction Span    Thumb Palmar Abduction Span    Index MCP (0-90)    Index PIP (0-100)    Index DIP (0-70)    Long MCP (0-90)    Long PIP (0-100)    Long DIP (0-70)    Ring MCP (0-90)    Ring PIP (0-100)    Ring DIP (0-70)    Little MCP (0-90)    Little PIP (0-100)    Little DIP (0-70)    (Blank rows = not tested)   UPPER EXTREMITY MMT:    Eval:  NT at eval due to recent and still healing injuries, but apparently 3 -/5 for most motions at the hand and wrist. Will be tested when appropriate.   MMT Left TBD  Shoulder flexion   Shoulder abduction   Shoulder adduction   Shoulder extension   Shoulder internal rotation   Shoulder external rotation   Middle trapezius   Lower trapezius   Elbow flexion   Elbow extension   Forearm supination   Forearm pronation   Wrist flexion   Wrist extension   Wrist ulnar deviation   Wrist radial deviation   (Blank rows = not tested)  HAND FUNCTION: Eval: Observed weakness in affected left hand.  Details will be tested when safe and appropriate Grip strength Right: TBD lbs, Left: TBD lbs   COORDINATION: Eval: Observed coordination impairments with affected left hand, as seen  by inability to functionally oppose her thumb to her fingers 9 Hole Peg Test Left: TBD sec (approximately 25 seconds is WFL)   SENSATION: Eval:  Light touch intact today, though diminished around sx area    EDEMA:   Eval:  Mildly  swollen in left hand and wrist today  COGNITION: Eval: Overall cognitive status: WFL for evaluation today   OBSERVATIONS:   Eval: Surgical area looking well-healing, no Steri-Strips on now, not overly hypersensitive but some mix of paresthesia present. Left thumb CMC arthroplasty   TODAY'S TREATMENT:  Post-evaluation treatment:    Custom orthotic fabrication was indicated due to pt's healing thumb surgery and need for safe, functional positioning. OT fabricated custom forearm-based thumb spica orthosis for pt today to immobilize the wrist and base of the thumb. It fit well with no areas of pressure, pt states a comfortable fit. Pt was educated on the wearing schedule (on at all times except for hygiene and exercises), to avoid exposing it to sources of heat, to wipe clean as needed (do not wash, use harsh detergents), to call or come in ASAP if it is causing any irritation or is not achieving desired function. It will be checked/adjusted in upcoming sessions, as needed. Pt states understanding all directions.     For safety/self-care she was educated on no weightbearing gripping pushing or pulling through the left hand and arm now.  She should be careful not to use her hand in the shower, but she can remove her new orthosis to shower.  She should perform scar mobilizations for desensitization several times a day.  She should perform the following home exercise program approximately 4-5 times a day to keep her tissues moving and gliding, decrease pain and stiffness, but these should not be done in a painful way or forcefully.  She demonstrates some back to show understanding today and leaves with no significant pain    Exercises - Reach arms upward   - 4 x daily  - 10 reps - Turn [employer] Facing Up & Down  - 4-6 x daily - 10-15 reps - Bend and Pull Back Wrist SLOWLY  - 4 x daily - 10-15 reps - Tendon Glides  - 4-6 x daily - 3-5 reps - 2-3 seconds hold - Thumb AROM IP Blocking  - 4-6 x daily - 10-15 reps  Patient Education - Scar Massage     PATIENT EDUCATION: Education details: See tx section above for details  Person educated: Patient Education method: Verbal Instruction, Teach back, Handouts  Education comprehension: States and demonstrates understanding, Additional Education required    HOME EXERCISE PROGRAM: Access Code: [email or web address] URL: [email or web address] Date: 08/13/2023 Prepared by: Leartis Proud   GOALS: Goals reviewed with patient? Yes   SHORT TERM GOALS: (STG required if POC>30 days) Target Date: 08/28/2023  Pt will obtain protective, custom orthotic. Goal status: 08/13/2023: MET   2.  Pt will demo/state understanding of initial HEP to improve pain levels and prerequisite motion. Goal status: INITIAL   LONG TERM GOALS: Target Date: 09/25/2023  Pt will improve functional ability by decreased impairment per PSFS assessment from 5.3 to 7.3 or better, for better quality of life. Goal status: INITIAL  2.  Pt will improve grip strength in left nondominant hand hand from unsafe to test lbs to at least 25 lbs for functional use at home and in IADLs. Goal status: INITIAL  3.  Pt will improve A/ROM in left wrist flexion/extension from 45/49 respectively to at least 60 degrees each, to have functional motion for tasks like reach and grasp.  Goal status: INITIAL  4.  Pt will improve strength in left wrist flexion/extension from apparent 3 -/5 MMT to at least 4+/5 MMT to have increased functional ability to carry out selfcare and higher-level  homecare tasks with less difficulty. Goal status: INITIAL  5.  Pt will improve coordination skills in left hand, as seen by within functional limit score on nine-hole peg  testing to have increased functional ability to carry out fine motor tasks (fasteners, etc.) and more complex, coordinated IADLs (meal prep, sports, etc.).  Goal status: INITIAL  6.  Pt will decrease pain at worst from 3/10 to 1/10 or better to have better sleep and occupational participation in daily roles. Goal status: INITIAL     ASSESSMENT:  CLINICAL IMPRESSION: Patient is a 73 y.o. female who was seen today for occupational therapy evaluation for left thumb arthritis and pain and subsequent CMC joint arthroplasty.  She has swelling, pain, weakness, stiffness and decreased functional ability for which she will benefit from outpatient occupational therapy to improve and get back to a good quality of life.   PERFORMANCE DEFICITS: in functional skills including ADLs, IADLs, coordination, dexterity, sensation, edema, ROM, strength, pain, fascial restrictions, flexibility, Fine motor control, body mechanics, endurance, decreased knowledge of precautions, wound, and UE functional use, cognitive skills including problem solving and safety awareness, and psychosocial skills including coping strategies and habits.   IMPAIRMENTS: are limiting patient from ADLs, IADLs, work, and leisure.   COMORBIDITIES: may have co-morbidities  that affects occupational performance. Patient will benefit from skilled OT to address above impairments and improve overall function.  MODIFICATION OR ASSISTANCE TO COMPLETE EVALUATION: No modification of tasks or assist necessary to complete an evaluation.  OT OCCUPATIONAL PROFILE AND HISTORY: Problem focused assessment: Including review of records relating to presenting problem.  CLINICAL DECISION MAKING: Moderate - several treatment options, min-mod task modification necessary  REHAB POTENTIAL: Excellent  EVALUATION COMPLEXITY: Low      PLAN:  OT FREQUENCY: 1x/week  OT DURATION: 6 weeks through 09/25/2023 and up to 7 total visits as needed  PLANNED  INTERVENTIONS: 97168 OT Re-evaluation, 97535 self care/ADL training, 95621 therapeutic exercise, 97530 therapeutic activity, 97112 neuromuscular re-education, 97140 manual therapy, 97035 ultrasound, 97039 fluidotherapy, 97010 moist heat, 97010 cryotherapy, 97760 Orthotic Initial, 97763 Orthotic/Prosthetic subsequent, scar mobilization, compression bandaging, coping strategies training, and patient/family education  RECOMMENDED OTHER SERVICES: None now  CONSULTED AND AGREED WITH PLAN OF CARE: Patient  PLAN FOR NEXT SESSION:   Check orthosis as needed, check initial recommendations and home exercise program and upgrade to light thumb CMC joint motion and MCP joint motion.   Fannie Knee, OTR/L,  CHT 08/13/2023, 12:41 PM   Date of referral: 08/05/23 Referring provider:  Cristie Hem, PA-C   Referring diagnosis? M18.12 (ICD-10-CM) - Arthritis of carpometacarpal (CMC) joint of left thumb  Treatment diagnosis? (if different than referring diagnosis) M79.642, M62.81  What was this (referring dx) caused by? Surgery (Type: Lt thumb CMC joint arthroplasty)  Nature of Condition: Initial Onset (within last 3 months)   Laterality: Lt  Current Functional Measure Score: Other patient specific functional scale: 5.3   Objective measurements identify impairments when they are compared to normal values, the uninvolved extremity, and prior level of function.  [x]  Yes  []  No  Objective assessment of functional ability: Moderate functional limitations   Briefly describe symptoms: Stiffness, soreness, weakness and decreased function  How did symptoms start: Initially was painful and arthritic, finally having the surgery on 23 July 2023  Average pain intensity:  Last 24 hours: 1-2/10  Past week: 1-2/10  How often does the pt experience symptoms? Occasionally  How much have the symptoms interfered with usual daily activities? Moderately  How  has condition changed since care began at this  facility? NA - initial visit  In general, how is the patients overall health? Good   BACK PAIN (STarT Back Screening Tool) No

## 2023-08-13 ENCOUNTER — Ambulatory Visit: Admitting: Rehabilitative and Restorative Service Providers"

## 2023-08-13 ENCOUNTER — Encounter: Payer: Medicare Other | Admitting: Physical Therapy

## 2023-08-13 ENCOUNTER — Encounter: Payer: Self-pay | Admitting: Rehabilitative and Restorative Service Providers"

## 2023-08-13 DIAGNOSIS — R600 Localized edema: Secondary | ICD-10-CM

## 2023-08-13 DIAGNOSIS — M6281 Muscle weakness (generalized): Secondary | ICD-10-CM | POA: Diagnosis not present

## 2023-08-13 DIAGNOSIS — M79642 Pain in left hand: Secondary | ICD-10-CM

## 2023-08-19 NOTE — Therapy (Signed)
 OUTPATIENT OCCUPATIONAL THERAPY TREATMENT NOTE   Patient Name: [name] MRN: [id number] DOB:April 20, 1951, 73 y.o., female Today's Date: 08/20/2023  PCP: Bascom Bossier MD REFERRING PROVIDER: Sandie Cross, PA-C   END OF SESSION:  OT End of Session - 08/20/23 1426     Visit Number 2    Number of Visits 7    Date for OT Re-Evaluation 09/25/23    Authorization Type [employer]    OT Start Time 1429    OT Stop Time 1515    OT Time Calculation (min) 46 min    Equipment Utilized During Treatment Orthotic materials    Activity Tolerance Patient tolerated treatment well;No increased pain;Patient limited by fatigue;Patient limited by pain    Behavior During Therapy [facility] for tasks assessed/performed              Past Medical History:  Diagnosis Date   Anemia    "years ago"   Arthritis    "left hand; was in my knees" (09/24/2017)   Breast cancer, right breast (HCC) 2004   Bruises easily    Chronic lower back pain    Ductal carcinoma in situ (DCIS) of left breast 2018   Genetic testing 04/17/2017   STAT Breast panel with reflex to Multi-Cancer panel (83 genes) @ [facility] - No pathogenic mutations detected   History of bronchitis    when she was young   Hypertension    no longer taking medications as of August 2019   Hypothyroidism    takes Synthroid  daily   Joint pain    Joint swelling    Personal history of chemotherapy 04/2003   Personal history of radiation therapy 2005   Pneumonia    hx of when she was young   Right elbow tendonitis    Sleep apnea    has a machine but does not use (09/24/2017)   Thyroid  cancer (HCC) 2012   Urinary frequency    Past Surgical History:  Procedure Laterality Date   ANTERIOR CERVICAL DECOMP/DISCECTOMY FUSION  2008   APPENDECTOMY  2007   BREAST BIOPSY Right 02/28/2003   malignant   BREAST BIOPSY Left 03/2017   BREAST LUMPECTOMY Right 2004   BREAST RECONSTRUCTION WITH PLACEMENT OF TISSUE EXPANDER AND FLEX HD (ACELLULAR HYDRATED DERMIS)  Bilateral 06/22/2017   Procedure: IMMEDIATE BILATERAL BREAST RECONSTRUCTION WITH PLACEMENT OF TISSUE EXPANDER AND FLEX HD (ACELLULAR HYDRATED DERMIS);  Surgeon: Thornell Flirt, DO;  Location: [facility];  Service: Plastics;  Laterality: Bilateral;   CESAREAN SECTION  1981   COLONOSCOPY  X 2   INSERTION OF MESH N/A 02/10/2020   Procedure: INSERTION OF MESH;  Surgeon: Caralyn Chandler, MD;  Location: [facility] OR;  Service: General;  Laterality: N/A;   KNEE ARTHROSCOPY Right 2007   KNEE ARTHROSCOPY  05/02/2011   Procedure: ARTHROSCOPY KNEE;  Surgeon: Loel Ring;  Location: [facility];  Service: Orthopedics;  Laterality: Right;   LATISSIMUS FLAP TO BREAST Right 09/23/2017   Procedure: RIGHT BREAST LATISSIMUS DORSI FLAP RECONSTRUCTION WITH TISSUE EXPANDER;  Surgeon: Thornell Flirt, DO;  Location: [facility] OR;  Service: Plastics;  Laterality: Right;   LIPOSUCTION WITH LIPOFILLING Bilateral 02/24/2019   Procedure: Bilateral lipofilling of breasts and liposuction of lateral breasts for symmetry;  Surgeon: Thornell Flirt, DO;  Location: [facility];  Service: Plastics;  Laterality: Bilateral;  2 hours, please   MASTECTOMY W/ SENTINEL NODE BIOPSY Bilateral 06/22/2017   Procedure: LEFT MASTECTOMY WITH SENTINEL LYMPH NODE  BIOPSY AND RIGHT PROPHYLACTIC MASTECTOMY;  Surgeon: Caralyn Chandler, MD;  Location: [facility];  Service: General;  Laterality: Bilateral;   MENISCUS DEBRIDEMENT  05/02/2011   Procedure: DEBRIDEMENT OF MENISCUS;  Surgeon: Loel Ring;  Location: [facility];  Service: Orthopedics;  Laterality: Right;   PORTA CATH REMOVAL  2005   PORTACATH PLACEMENT  2004   REMOVAL OF BILATERAL TISSUE EXPANDERS WITH PLACEMENT OF BILATERAL BREAST IMPLANTS Bilateral 05/26/2018   Procedure: REMOVAL OF BILATERAL TISSUE EXPANDERS WITH PLACEMENT OF BILATERAL BREAST IMPLANTS;  Surgeon: Thornell Flirt, DO;  Location: [facility];  Service: Plastics;  Laterality: Bilateral;   REMOVAL OF TISSUE EXPANDER AND PLACEMENT OF IMPLANT Right 12/12/2017   Procedure: REMOVAL OF TISSUE EXPANDER, RIGHT;  Surgeon: Alger Infield, MD;  Location: [facility] OR;  Service: Plastics;  Laterality: Right;   THYROIDECTOMY N/A 09/22/2012   Procedure: RIGHT THYROID  LOBECTOMY WITH FROZEN SECTION;  Surgeon: Janita Mellow, MD;  Location: [facility] OR;  Service: ENT;  Laterality: N/A;   THYROIDECTOMY Left 10/21/2012   Procedure: COMPLETION OF THYROIDECTOMY;  Surgeon: Janita Mellow, MD;  Location: [facility] OR;  Service: ENT;  Laterality: Left;   TISSUE EXPANDER PLACEMENT Right 03/03/2018   Procedure: TISSUE EXPANDER PLACEMENT;  Surgeon: Thornell Flirt, DO;  Location: [facility];  Service: Plastics;  Laterality: Right;   TOTAL KNEE ARTHROPLASTY  04/14/2012   Procedure: TOTAL KNEE ARTHROPLASTY;  Surgeon: Ferd Householder, MD;  Location: [facility] OR;  Service: Orthopedics;  Laterality: Right;  RIGHT ARTHROPLASTY KNEE MEDIAL/LATERAL COMPARTMENTS WITH PATELLA RESURFACING   TOTAL KNEE ARTHROPLASTY Left 03/30/2013   Procedure: TOTAL KNEE ARTHROPLASTY;  Surgeon: Ferd Householder, MD;  Location: [facility] OR;  Service: Orthopedics;  Laterality: Left;   VENTRAL HERNIA REPAIR N/A 02/10/2020   Procedure: LAPAROSCOPIC ASSISTED VENTRAL HERNIA REPAIR;  Surgeon: Caralyn Chandler, MD;  Location: [facility] OR;  Service: General;  Laterality: N/A;   Patient Active Problem List   Diagnosis Date Noted   Breast asymmetry following reconstructive surgery 05/22/2023   Torn earlobe, left, initial encounter 12/17/2018   S/P breast reconstruction, bilateral 03/16/2018   History of breast cancer in female 01/27/2018   History of reconstruction of both breasts 01/27/2018   Acquired absence of right breast 09/23/2017   Breast cancer (HCC) 06/22/2017   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   Genetic testing 04/17/2017   DJD (degenerative joint disease) of knee  03/30/2013   Witnessed episode of apnea 03/23/2013   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   Weakness 11/28/2011   Hypertension 11/28/2011   Hypokalemia 11/28/2011   LUE weakness 11/28/2011   Hypothyroidism 11/28/2011   Thyroid  cancer (HCC) 04/28/2010    ONSET DATE: DOS 07/23/23  REFERRING DIAG: M18.12 (ICD-10-CM) - Arthritis of carpometacarpal (CMC) joint of left thumb   THERAPY DIAG:  Localized edema  Pain in left hand  Muscle weakness (generalized)  Rationale for Evaluation and Treatment: Rehabilitation  PERTINENT HISTORY: Arthritis, breast cancer survivor, chronic low back pain, history of thyroid  issues and cancer as well. She states working from home for [employer], but will retire in May. She states no significant pain now.     PRECAUTIONS: None  RED FLAGS: None   WEIGHT BEARING RESTRICTIONS: Yes no weightbearing in the left hand and thumb now    SUBJECTIVE:   SUBJECTIVE STATEMENT: 4 weeks s/p Lt thumb CMC J arthroplasty. She states that she has some questions about her home exercise program.  She  is not having any significant pain now and has been doing well    PAIN:  Are you having pain? Yes: NPRS scale: none now but up to 0/10 at worst in past week  Pain location: Left thumb and wrist Pain description: Aching and sore Aggravating factors: Motion and weightbearing Relieving factors: Rest   PATIENT GOALS: Improve use of left hand and arm safely to return to normal activities  NEXT MD VISIT: As needed    OBJECTIVE: (All objective assessments below are from initial evaluation on: 08/13/23 unless otherwise specified.)   HAND DOMINANCE: Right   ADLs: Overall ADLs: States decreased ability to grab, hold household objects, pain and difficulty to open containers, perform FMS tasks (manipulate fasteners on clothing), mild to moderate bathing problems as well.    FUNCTIONAL OUTCOME MEASURES: Eval: Patient Specific Functional Scale: 5.3 (grab objects, dressed, open a jar)   (Higher Score  =  Better Ability for the Selected Tasks)       UPPER EXTREMITY ROM     Shoulder to Wrist AROM Left eval Lt 08/20/23  Shoulder flexion    Shoulder abduction    Shoulder extension    Shoulder internal rotation    Shoulder external rotation    Elbow flexion    Elbow extension    Forearm supination 77   Forearm pronation  80   Wrist flexion 45 34  Wrist extension 49 53  Wrist ulnar deviation    Wrist radial deviation    Functional dart thrower's motion (F-DTM) in ulnar flexion    F-DTM in radial extension     (Blank rows = not tested)   Hand AROM Left eval Lt 08/20/23  Full Fist Ability (or Gap to Distal Palmar Crease) Full but loose    Thumb Opposition  (Kapandji Scale)   8/10  Thumb MCP (0-60) 0- 27 0 - 30  Thumb IP (0-80) +15 - 20 (+15) - 52  Thumb Radial Abduction Span     Thumb Palmar Abduction Span     Index MCP (0-90)     Index PIP (0-100)     Index DIP (0-70)     Long MCP (0-90)     Long PIP (0-100)     Long DIP (0-70)     Ring MCP (0-90)     Ring PIP (0-100)     Ring DIP (0-70)     Little MCP (0-90)     Little PIP (0-100)     Little DIP (0-70)     (Blank rows = not tested)   UPPER EXTREMITY MMT:    Eval:  NT at eval due to recent and still healing injuries, but apparently 3 -/5 for most motions at the hand and wrist. Will be tested when appropriate.   MMT Left TBD  Shoulder flexion   Shoulder abduction   Shoulder adduction   Shoulder extension   Shoulder internal rotation   Shoulder external rotation   Middle trapezius   Lower trapezius   Elbow flexion   Elbow extension   Forearm supination   Forearm pronation   Wrist flexion   Wrist extension   Wrist ulnar deviation   Wrist radial deviation   (Blank rows = not tested)  HAND FUNCTION: Eval: Observed weakness in affected left hand.  Details will be tested when safe and appropriate Grip strength Right: TBD lbs, Left: TBD lbs   COORDINATION: Eval: Observed coordination  impairments with affected left hand, as seen by inability to functionally oppose her  thumb to her fingers 9 Hole Peg Test Left: TBD sec (approximately 25 seconds is WFL)   SENSATION: Eval:  Light touch intact today, though diminished around sx area    EDEMA:   Eval:  Mildly swollen in left hand and wrist today  COGNITION: Eval: Overall cognitive status: WFL for evaluation today   OBSERVATIONS:   Eval: Surgical area looking well-healing, no Steri-Strips on now, not overly hypersensitive but some mix of paresthesia present. Left thumb CMC arthroplasty   TODAY'S TREATMENT:  08/20/23: She performs active range of motion for exercise as well as new measures which shows her wrist flexion is a bit stiffer and she had not been doing that exercise correctly at home.  We carefully review her home exercises, and she performs them back for understanding today.  OT also adds several new exercises now that she is 4 weeks postop including CMC joint thumb motion (as bolded below).  Her orthosis is working well and not irritating her, and OT will try to upgrade her to a hand-based orthosis now as tolerated reflecting her low pain.  OT custom fabricates a hand-based thumb spica orthosis with IP joint free to allow wrist motion to hopefully improve these motions.  She was carefully educated, however, that she should immediately return to the forearm-based orthosis if she has any pain, and she should wear the forearm-based orthosis at night.  She states understanding and this was written down for her.  We discussed her self-care, not picking up anything that weighs more than a pen or 1 pound, as she does states she has been trying to lift a cup without her brace on.  She also has not been in the shower yet, so OT reminds her that she can be getting in the shower, washing her arm, etc.  She washes her arm during the session today, applies lotion in her skin looks much better and healthier.   Exercises Done Today:  -  Bend and Pull Back Wrist SLOWLY  - 4 x daily - 10-15 reps - "Windshield Wipers"   - 4 x daily - 10-15 reps - Tendon Glides  - 4-6 x daily - 3-5 reps - 2-3 seconds hold - Seated Thumb Circumduction AROM  - 4-6 x daily - 10-15 reps - Thumb Opposition  - 4-6 x daily - 10 reps Patient Education - Scar Massage     PATIENT EDUCATION: Education details: See tx section above for details  Person educated: Patient Education method: Verbal Instruction, Teach back, Handouts  Education comprehension: States and demonstrates understanding, Additional Education required    HOME EXERCISE PROGRAM: Access Code: [email or web address] URL: [email or web address] Date: 08/13/2023 Prepared by: Leartis Proud   GOALS: Goals reviewed with patient? Yes   SHORT TERM GOALS: (STG required if POC>30 days) Target Date: 08/28/2023  Pt will obtain protective, custom orthotic. Goal status: 08/13/2023: MET   2.  Pt will demo/state understanding of initial HEP to improve pain levels and prerequisite motion. Goal status: INITIAL   LONG TERM GOALS: Target Date: 09/25/2023  Pt will improve functional ability by decreased impairment per PSFS assessment from 5.3 to 7.3 or better, for better quality of life. Goal status: INITIAL  2.  Pt will improve grip strength in left nondominant hand hand from unsafe to test lbs to at least 25 lbs for functional use at home and in IADLs. Goal status: INITIAL  3.  Pt will improve A/ROM in left wrist flexion/extension from 45/49 respectively to at least 60 degrees each, to  have functional motion for tasks like reach and grasp.  Goal status: INITIAL  4.  Pt will improve strength in left wrist flexion/extension from apparent 3 -/5 MMT to at least 4+/5 MMT to have increased functional ability to carry out selfcare and higher-level homecare tasks with less difficulty. Goal status: INITIAL  5.  Pt will improve coordination skills in left hand, as seen by within functional  limit score on nine-hole peg testing to have increased functional ability to carry out fine motor tasks (fasteners, etc.) and more complex, coordinated IADLs (meal prep, sports, etc.).  Goal status: INITIAL  6.  Pt will decrease pain at worst from 3/10 to 1/10 or better to have better sleep and occupational participation in daily roles. Goal status: INITIAL     ASSESSMENT:  CLINICAL IMPRESSION: 08/20/23: She continues to do well, though she did not follow all directions completely, had some questions that were hopefully answered and solved today.  We will try to start weaning the longer forearm-based brace and moved to a hand-based brace now.  We will start at some light functional activities in the next week.  Patient is a 73 y.o. female who was seen today for occupational therapy evaluation for left thumb arthritis and pain and subsequent CMC joint arthroplasty.  She has swelling, pain, weakness, stiffness and decreased functional ability for which she will benefit from outpatient occupational therapy to improve and get back to a good quality of life.     PLAN:  OT FREQUENCY: 1x/week  OT DURATION: 6 weeks through 09/25/2023 and up to 7 total visits as needed  PLANNED INTERVENTIONS: 97168 OT Re-evaluation, 97535 self care/ADL training, 01027 therapeutic exercise, 97530 therapeutic activity, 97112 neuromuscular re-education, 97140 manual therapy, 97035 ultrasound, 97039 fluidotherapy, 97010 moist heat, 97010 cryotherapy, 97760 Orthotic Initial, 97763 Orthotic/Prosthetic subsequent, scar mobilization, compression bandaging, coping strategies training, and patient/family education  RECOMMENDED OTHER SERVICES: None now  CONSULTED AND AGREED WITH PLAN OF CARE: Patient  PLAN FOR NEXT SESSION:   We will try to start weaning the longer forearm-based brace and moved to a hand-based brace now.  We will start at some light functional activities in the next week.   Leartis Proud, OTR/L,   CHT 08/20/2023, 4:48 PM

## 2023-08-20 ENCOUNTER — Encounter: Payer: Self-pay | Admitting: Rehabilitative and Restorative Service Providers"

## 2023-08-20 ENCOUNTER — Ambulatory Visit: Admitting: Rehabilitative and Restorative Service Providers"

## 2023-08-20 DIAGNOSIS — M6281 Muscle weakness (generalized): Secondary | ICD-10-CM

## 2023-08-20 DIAGNOSIS — M79642 Pain in left hand: Secondary | ICD-10-CM

## 2023-08-20 DIAGNOSIS — R600 Localized edema: Secondary | ICD-10-CM

## 2023-08-21 NOTE — Therapy (Signed)
 OUTPATIENT OCCUPATIONAL THERAPY TREATMENT NOTE   Patient Name: [name] MRN: [id number] DOB:Feb 15, 1951, 73 y.o., female Today's Date: 08/24/2023  PCP: Bascom Bossier MD REFERRING PROVIDER: Sandie Cross, PA-C   END OF SESSION:  OT End of Session - 08/24/23 0931     Visit Number 3    Number of Visits 7    Date for OT Re-Evaluation 09/25/23    Authorization Type [employer]    OT Start Time 0931    OT Stop Time 1009    OT Time Calculation (min) 38 min    Activity Tolerance Patient tolerated treatment well;No increased pain;Patient limited by fatigue;Patient limited by pain    Behavior During Therapy [facility] for tasks assessed/performed               Past Medical History:  Diagnosis Date   Anemia    "years ago"   Arthritis    "left hand; was in my knees" (09/24/2017)   Breast cancer, right breast (HCC) 2004   Bruises easily    Chronic lower back pain    Ductal carcinoma in situ (DCIS) of left breast 2018   Genetic testing 04/17/2017   STAT Breast panel with reflex to Multi-Cancer panel (83 genes) @ [facility] - No pathogenic mutations detected   History of bronchitis    when she was young   Hypertension    no longer taking medications as of August 2019   Hypothyroidism    takes Synthroid  daily   Joint pain    Joint swelling    Personal history of chemotherapy 04/2003   Personal history of radiation therapy 2005   Pneumonia    hx of when she was young   Right elbow tendonitis    Sleep apnea    has a machine but does not use (09/24/2017)   Thyroid  cancer (HCC) 2012   Urinary frequency    Past Surgical History:  Procedure Laterality Date   ANTERIOR CERVICAL DECOMP/DISCECTOMY FUSION  2008   APPENDECTOMY  2007   BREAST BIOPSY Right 02/28/2003   malignant   BREAST BIOPSY Left 03/2017   BREAST LUMPECTOMY Right 2004   BREAST RECONSTRUCTION WITH PLACEMENT OF TISSUE EXPANDER AND FLEX HD (ACELLULAR HYDRATED DERMIS) Bilateral 06/22/2017   Procedure: IMMEDIATE BILATERAL  BREAST RECONSTRUCTION WITH PLACEMENT OF TISSUE EXPANDER AND FLEX HD (ACELLULAR HYDRATED DERMIS);  Surgeon: Thornell Flirt, DO;  Location: [facility];  Service: Plastics;  Laterality: Bilateral;   CESAREAN SECTION  1981   COLONOSCOPY  X 2   INSERTION OF MESH N/A 02/10/2020   Procedure: INSERTION OF MESH;  Surgeon: Caralyn Chandler, MD;  Location: [facility] OR;  Service: General;  Laterality: N/A;   KNEE ARTHROSCOPY Right 2007   KNEE ARTHROSCOPY  05/02/2011   Procedure: ARTHROSCOPY KNEE;  Surgeon: Loel Ring;  Location: [facility];  Service: Orthopedics;  Laterality: Right;   LATISSIMUS FLAP TO BREAST Right 09/23/2017   Procedure: RIGHT BREAST LATISSIMUS DORSI FLAP RECONSTRUCTION WITH TISSUE EXPANDER;  Surgeon: Thornell Flirt, DO;  Location: [facility] OR;  Service: Plastics;  Laterality: Right;   LIPOSUCTION WITH LIPOFILLING Bilateral 02/24/2019   Procedure: Bilateral lipofilling of breasts and liposuction of lateral breasts for symmetry;  Surgeon: Thornell Flirt, DO;  Location:  SURGERY CENTER;  Service: Plastics;  Laterality: Bilateral;  2 hours, please   MASTECTOMY W/ SENTINEL NODE BIOPSY Bilateral 06/22/2017   Procedure: LEFT MASTECTOMY WITH SENTINEL LYMPH NODE BIOPSY AND RIGHT PROPHYLACTIC MASTECTOMY;  Surgeon: Alethea Andes,  Nancyann Aye, MD;  Location: [facility];  Service: General;  Laterality: Bilateral;   MENISCUS DEBRIDEMENT  05/02/2011   Procedure: DEBRIDEMENT OF MENISCUS;  Surgeon: Loel Ring;  Location: [facility];  Service: Orthopedics;  Laterality: Right;   PORTA CATH REMOVAL  2005   PORTACATH PLACEMENT  2004   REMOVAL OF BILATERAL TISSUE EXPANDERS WITH PLACEMENT OF BILATERAL BREAST IMPLANTS Bilateral 05/26/2018   Procedure: REMOVAL OF BILATERAL TISSUE EXPANDERS WITH PLACEMENT OF BILATERAL BREAST IMPLANTS;  Surgeon: Thornell Flirt, DO;  Location: [facility];  Service: Plastics;  Laterality:  Bilateral;   REMOVAL OF TISSUE EXPANDER AND PLACEMENT OF IMPLANT Right 12/12/2017   Procedure: REMOVAL OF TISSUE EXPANDER, RIGHT;  Surgeon: Alger Infield, MD;  Location: [facility] OR;  Service: Plastics;  Laterality: Right;   THYROIDECTOMY N/A 09/22/2012   Procedure: RIGHT THYROID  LOBECTOMY WITH FROZEN SECTION;  Surgeon: Janita Mellow, MD;  Location: [facility] OR;  Service: ENT;  Laterality: N/A;   THYROIDECTOMY Left 10/21/2012   Procedure: COMPLETION OF THYROIDECTOMY;  Surgeon: Janita Mellow, MD;  Location: [facility] OR;  Service: ENT;  Laterality: Left;   TISSUE EXPANDER PLACEMENT Right 03/03/2018   Procedure: TISSUE EXPANDER PLACEMENT;  Surgeon: Thornell Flirt, DO;  Location: [facility];  Service: Plastics;  Laterality: Right;   TOTAL KNEE ARTHROPLASTY  04/14/2012   Procedure: TOTAL KNEE ARTHROPLASTY;  Surgeon: Ferd Householder, MD;  Location: [facility] OR;  Service: Orthopedics;  Laterality: Right;  RIGHT ARTHROPLASTY KNEE MEDIAL/LATERAL COMPARTMENTS WITH PATELLA RESURFACING   TOTAL KNEE ARTHROPLASTY Left 03/30/2013   Procedure: TOTAL KNEE ARTHROPLASTY;  Surgeon: Ferd Householder, MD;  Location: [facility] OR;  Service: Orthopedics;  Laterality: Left;   VENTRAL HERNIA REPAIR N/A 02/10/2020   Procedure: LAPAROSCOPIC ASSISTED VENTRAL HERNIA REPAIR;  Surgeon: Caralyn Chandler, MD;  Location: [facility] OR;  Service: General;  Laterality: N/A;   Patient Active Problem List   Diagnosis Date Noted   Breast asymmetry following reconstructive surgery 05/22/2023   Torn earlobe, left, initial encounter 12/17/2018   S/P breast reconstruction, bilateral 03/16/2018   History of breast cancer in female 01/27/2018   History of reconstruction of both breasts 01/27/2018   Acquired absence of right breast 09/23/2017   Breast cancer (HCC) 06/22/2017   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   Genetic testing 04/17/2017   DJD (degenerative joint disease) of knee 03/30/2013   Witnessed episode of apnea 03/23/2013    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   Weakness 11/28/2011   Hypertension 11/28/2011   Hypokalemia 11/28/2011   LUE weakness 11/28/2011   Hypothyroidism 11/28/2011   Thyroid  cancer (HCC) 04/28/2010    ONSET DATE: DOS 07/23/23  REFERRING DIAG: M18.12 (ICD-10-CM) - Arthritis of carpometacarpal (CMC) joint of left thumb   THERAPY DIAG:  Localized edema  Muscle weakness (generalized)  Pain in left hand  Rationale for Evaluation and Treatment: Rehabilitation  PERTINENT HISTORY: Arthritis, breast cancer survivor, chronic low back pain, history of thyroid  issues and cancer as well. She states working from home for [employer], but will retire in May. She states no significant pain now.     PRECAUTIONS: None  RED FLAGS: None   WEIGHT BEARING RESTRICTIONS: Yes no weightbearing in the left hand and thumb now    SUBJECTIVE:   SUBJECTIVE STATEMENT: 4.5 weeks s/p Lt thumb CMC J arthroplasty. She states doing very well, no pain, doing her exercises about 4 times a day if she can.  PAIN:  Are you having pain? Yes: NPRS scale: none now but up to  0/10 at worst in past week  Pain location: Left thumb and wrist Pain description: Aching and sore Aggravating factors: Motion and weightbearing Relieving factors: Rest   PATIENT GOALS: Improve use of left hand and arm safely to return to normal activities  NEXT MD VISIT: As needed    OBJECTIVE: (All objective assessments below are from initial evaluation on: 08/13/23 unless otherwise specified.)   HAND DOMINANCE: Right   ADLs: Overall ADLs: States decreased ability to grab, hold household objects, pain and difficulty to open containers, perform FMS tasks (manipulate fasteners on clothing), mild to moderate bathing problems as well.    FUNCTIONAL OUTCOME MEASURES: Eval: Patient Specific Functional Scale: 5.3 (grab objects, dressed, open a jar)  (Higher Score  =  Better Ability for the Selected Tasks)       UPPER EXTREMITY ROM     Shoulder  to Wrist AROM Left eval Lt 08/20/23 Lt 08/24/23  Shoulder flexion     Shoulder abduction     Shoulder extension     Shoulder internal rotation     Shoulder external rotation     Elbow flexion     Elbow extension     Forearm supination 77    Forearm pronation  80    Wrist flexion 45 34 35  Wrist extension 49 53 55  Wrist ulnar deviation     Wrist radial deviation     Functional dart thrower's motion (F-DTM) in ulnar flexion     F-DTM in radial extension      (Blank rows = not tested)   Hand AROM Left eval Lt 08/20/23 Lt 08/24/23  Full Fist Ability (or Gap to Distal Palmar Crease) Full but loose     Thumb Opposition  (Kapandji Scale)   8/10 8/ 10  Thumb MCP (0-60) 0- 27 0 - 30 0 - 38  Thumb IP (0-80) +15 - 20 (+15) - 52 (+15) - 47  Thumb Radial Abduction Span      Thumb Palmar Abduction Span      Index MCP (0-90)      Index PIP (0-100)      Index DIP (0-70)      Long MCP (0-90)      Long PIP (0-100)      Long DIP (0-70)      Ring MCP (0-90)      Ring PIP (0-100)      Ring DIP (0-70)      Little MCP (0-90)      Little PIP (0-100)      Little DIP (0-70)      (Blank rows = not tested)   UPPER EXTREMITY MMT:    Eval:  NT at eval due to recent and still healing injuries, but apparently 3 -/5 for most motions at the hand and wrist. Will be tested when appropriate.   MMT Left TBD  Shoulder flexion   Shoulder abduction   Shoulder adduction   Shoulder extension   Shoulder internal rotation   Shoulder external rotation   Middle trapezius   Lower trapezius   Elbow flexion   Elbow extension   Forearm supination   Forearm pronation   Wrist flexion   Wrist extension   Wrist ulnar deviation   Wrist radial deviation   (Blank rows = not tested)  HAND FUNCTION: Eval: Observed weakness in affected left hand.  Details will be tested when safe and appropriate  Grip strength Right: TBD lbs, Left: TBD lbs   COORDINATION: Eval: Observed coordination impairments with  affected left hand, as seen by inability to functionally oppose her thumb to her fingers 9 Hole Peg Test Left: TBD sec (approximately 25 seconds is WFL)   SENSATION: Eval:  Light touch intact today, though diminished around sx area    EDEMA:   Eval:  Mildly swollen in left hand and wrist today  OBSERVATIONS:   Eval: Surgical area looking well-healing, no Steri-Strips on now, not overly hypersensitive but some mix of paresthesia present. Left thumb CMC arthroplasty   TODAY'S TREATMENT:  08/24/23: With active range of motion for new measures as well as exercises which show stiffness in wrist flexion, and some ulnar drooping.  Thumb is improving.  OT educates her to watch for ulnar gripping, and all new stretches at the wrist in flexion and extension.  OT explains these to her while she is on moist heat for about 3 minutes, and then OT does manual therapy IASTM to loosen up tight tissues in perform myofascial release.  She has better motion and less soreness after these things.  Next, we perform her exercises together to ensure she can do them without pain, and she states they are tolerable though she has some problems relaxing her hand and her wrist.  We discussed using hand based brace in the daytime now, and the long arm brace at night still.  She states understanding and leaves without any pain.   Exercises - Turn [employer] Facing Up & Down  - 4-6 x daily - 10-15 reps - Bend and Pull Back Wrist SLOWLY  - 4 x daily - 10-15 reps - "Windshield Wipers"   - 4 x daily - 10-15 reps - Wrist Flexion Stretch  - 4 x daily - 3-5 reps - 15 sec hold - Wrist Prayer Stretch  - 4 x daily - 3-5 reps - 15 sec hold - Tendon Glides  - 4-6 x daily - 3-5 reps - 2-3 seconds hold - Seated Thumb Circumduction AROM  - 4-6 x daily - 10-15 reps - Thumb Opposition  - 4-6 x daily - 10 reps  Patient Education - Scar Massage     PATIENT EDUCATION: Education details: See tx section above for details  Person educated:  Patient Education method: Verbal Instruction, Teach back, Handouts  Education comprehension: States and demonstrates understanding, Additional Education required    HOME EXERCISE PROGRAM: Access Code: [email or web address] URL: [email or web address] Date: 08/13/2023 Prepared by: Leartis Proud   GOALS: Goals reviewed with patient? Yes   SHORT TERM GOALS: (STG required if POC>30 days) Target Date: 08/28/2023  Pt will obtain protective, custom orthotic. Goal status: 08/13/2023: MET   2.  Pt will demo/state understanding of initial HEP to improve pain levels and prerequisite motion. Goal status: 08/24/23: MET   LONG TERM GOALS: Target Date: 09/25/2023  Pt will improve functional ability by decreased impairment per PSFS assessment from 5.3 to 7.3 or better, for better quality of life. Goal status: INITIAL  2.  Pt will improve grip strength in left nondominant hand hand from unsafe to test lbs to at least 25 lbs for functional use at home and in IADLs. Goal status: INITIAL  3.  Pt will improve A/ROM in left wrist flexion/extension from 45/49 respectively to at least 60 degrees each, to have functional motion for tasks like reach and grasp.  Goal status: INITIAL  4.  Pt will improve strength in left wrist flexion/extension from apparent 3 -/  5 MMT to at least 4+/5 MMT to have increased functional ability to carry out selfcare and higher-level homecare tasks with less difficulty. Goal status: INITIAL  5.  Pt will improve coordination skills in left hand, as seen by within functional limit score on nine-hole peg testing to have increased functional ability to carry out fine motor tasks (fasteners, etc.) and more complex, coordinated IADLs (meal prep, sports, etc.).  Goal status: INITIAL  6.  Pt will decrease pain at worst from 3/10 to 1/10 or better to have better sleep and occupational participation in daily roles. Goal status: INITIAL     ASSESSMENT:  CLINICAL  IMPRESSION: 08/24/23: Tolerating hand-based brace, tolerating light stretches now.  Continue on  08/20/23: She continues to do well, though she did not follow all directions completely, had some questions that were hopefully answered and solved today.  We will try to start weaning the longer forearm-based brace and moved to a hand-based brace now.  We will start at some light functional activities in the next week.    PLAN:  OT FREQUENCY: 1x/week  OT DURATION: 6 weeks through 09/25/2023 and up to 7 total visits as needed  PLANNED INTERVENTIONS: 97168 OT Re-evaluation, 97535 self care/ADL training, 82956 therapeutic exercise, 97530 therapeutic activity, 97112 neuromuscular re-education, 97140 manual therapy, 97035 ultrasound, 97039 fluidotherapy, 97010 moist heat, 97010 cryotherapy, 97760 Orthotic Initial, 97763 Orthotic/Prosthetic subsequent, scar mobilization, compression bandaging, coping strategies training, and patient/family education   CONSULTED AND AGREED WITH PLAN OF CARE: Patient  PLAN FOR NEXT SESSION:   Upgrade protocol next week, continue with manual therapy as helpful   Leartis Proud, OTR/L,  CHT 08/24/2023, 10:17 AM

## 2023-08-24 ENCOUNTER — Encounter: Payer: Self-pay | Admitting: Rehabilitative and Restorative Service Providers"

## 2023-08-24 ENCOUNTER — Ambulatory Visit: Admitting: Rehabilitative and Restorative Service Providers"

## 2023-08-24 DIAGNOSIS — R600 Localized edema: Secondary | ICD-10-CM

## 2023-08-24 DIAGNOSIS — M79642 Pain in left hand: Secondary | ICD-10-CM | POA: Diagnosis not present

## 2023-08-24 DIAGNOSIS — M6281 Muscle weakness (generalized): Secondary | ICD-10-CM | POA: Diagnosis not present

## 2023-08-28 ENCOUNTER — Encounter: Admitting: Rehabilitative and Restorative Service Providers"

## 2023-08-31 DIAGNOSIS — I10 Essential (primary) hypertension: Secondary | ICD-10-CM | POA: Diagnosis not present

## 2023-08-31 DIAGNOSIS — G4730 Sleep apnea, unspecified: Secondary | ICD-10-CM | POA: Diagnosis not present

## 2023-08-31 DIAGNOSIS — R7309 Other abnormal glucose: Secondary | ICD-10-CM | POA: Diagnosis not present

## 2023-08-31 DIAGNOSIS — E785 Hyperlipidemia, unspecified: Secondary | ICD-10-CM | POA: Diagnosis not present

## 2023-08-31 DIAGNOSIS — R7303 Prediabetes: Secondary | ICD-10-CM | POA: Diagnosis not present

## 2023-09-02 ENCOUNTER — Encounter: Admitting: Rehabilitative and Restorative Service Providers"

## 2023-09-04 ENCOUNTER — Encounter: Admitting: Rehabilitative and Restorative Service Providers"

## 2023-09-08 NOTE — Therapy (Signed)
 OUTPATIENT OCCUPATIONAL THERAPY TREATMENT NOTE   Patient Name: [name] MRN: [id number] DOB:1950-06-06, 73 y.o., female Today's Date: 09/09/2023  PCP: Bascom Bossier MD REFERRING PROVIDER: Sandie Cross, PA-C   END OF SESSION:  OT End of Session - 09/09/23 0933     Visit Number 4    Number of Visits 7    Date for OT Re-Evaluation 09/25/23    Authorization Type [employer]    OT Start Time 0933    OT Stop Time 1018    OT Time Calculation (min) 45 min    Equipment Utilized During Treatment Orthotic materials    Activity Tolerance Patient tolerated treatment well;No increased pain;Patient limited by fatigue;Patient limited by pain    Behavior During Therapy [facility] for tasks assessed/performed             Past Medical History:  Diagnosis Date   Anemia    "years ago"   Arthritis    "left hand; was in my knees" (09/24/2017)   Breast cancer, right breast (HCC) 2004   Bruises easily    Chronic lower back pain    Ductal carcinoma in situ (DCIS) of left breast 2018   Genetic testing 04/17/2017   STAT Breast panel with reflex to Multi-Cancer panel (83 genes) @ [facility] - No pathogenic mutations detected   History of bronchitis    when she was young   Hypertension    no longer taking medications as of August 2019   Hypothyroidism    takes Synthroid  daily   Joint pain    Joint swelling    Personal history of chemotherapy 04/2003   Personal history of radiation therapy 2005   Pneumonia    hx of when she was young   Right elbow tendonitis    Sleep apnea    has a machine but does not use (09/24/2017)   Thyroid  cancer (HCC) 2012   Urinary frequency    Past Surgical History:  Procedure Laterality Date   ANTERIOR CERVICAL DECOMP/DISCECTOMY FUSION  2008   APPENDECTOMY  2007   BREAST BIOPSY Right 02/28/2003   malignant   BREAST BIOPSY Left 03/2017   BREAST LUMPECTOMY Right 2004   BREAST RECONSTRUCTION WITH PLACEMENT OF TISSUE EXPANDER AND FLEX HD (ACELLULAR HYDRATED DERMIS)  Bilateral 06/22/2017   Procedure: IMMEDIATE BILATERAL BREAST RECONSTRUCTION WITH PLACEMENT OF TISSUE EXPANDER AND FLEX HD (ACELLULAR HYDRATED DERMIS);  Surgeon: Thornell Flirt, DO;  Location: [facility];  Service: Plastics;  Laterality: Bilateral;   CESAREAN SECTION  1981   COLONOSCOPY  X 2   INSERTION OF MESH N/A 02/10/2020   Procedure: INSERTION OF MESH;  Surgeon: Caralyn Chandler, MD;  Location: [facility] OR;  Service: General;  Laterality: N/A;   KNEE ARTHROSCOPY Right 2007   KNEE ARTHROSCOPY  05/02/2011   Procedure: ARTHROSCOPY KNEE;  Surgeon: Loel Ring;  Location:  SURGERY CENTER;  Service: Orthopedics;  Laterality: Right;   LATISSIMUS FLAP TO BREAST Right 09/23/2017   Procedure: RIGHT BREAST LATISSIMUS DORSI FLAP RECONSTRUCTION WITH TISSUE EXPANDER;  Surgeon: Thornell Flirt, DO;  Location: [facility] OR;  Service: Plastics;  Laterality: Right;   LIPOSUCTION WITH LIPOFILLING Bilateral 02/24/2019   Procedure: Bilateral lipofilling of breasts and liposuction of lateral breasts for symmetry;  Surgeon: Thornell Flirt, DO;  Location: [facility];  Service: Plastics;  Laterality: Bilateral;  2 hours, please   MASTECTOMY W/ SENTINEL NODE BIOPSY Bilateral 06/22/2017   Procedure: LEFT MASTECTOMY WITH SENTINEL LYMPH NODE BIOPSY  AND RIGHT PROPHYLACTIC MASTECTOMY;  Surgeon: Caralyn Chandler, MD;  Location: [facility];  Service: General;  Laterality: Bilateral;   MENISCUS DEBRIDEMENT  05/02/2011   Procedure: DEBRIDEMENT OF MENISCUS;  Surgeon: Loel Ring;  Location: [facility];  Service: Orthopedics;  Laterality: Right;   PORTA CATH REMOVAL  2005   PORTACATH PLACEMENT  2004   REMOVAL OF BILATERAL TISSUE EXPANDERS WITH PLACEMENT OF BILATERAL BREAST IMPLANTS Bilateral 05/26/2018   Procedure: REMOVAL OF BILATERAL TISSUE EXPANDERS WITH PLACEMENT OF BILATERAL BREAST IMPLANTS;  Surgeon: Thornell Flirt, DO;  Location: [facility];  Service: Plastics;  Laterality: Bilateral;   REMOVAL OF TISSUE EXPANDER AND PLACEMENT OF IMPLANT Right 12/12/2017   Procedure: REMOVAL OF TISSUE EXPANDER, RIGHT;  Surgeon: Alger Infield, MD;  Location: [facility] OR;  Service: Plastics;  Laterality: Right;   THYROIDECTOMY N/A 09/22/2012   Procedure: RIGHT THYROID  LOBECTOMY WITH FROZEN SECTION;  Surgeon: Janita Mellow, MD;  Location: [facility] OR;  Service: ENT;  Laterality: N/A;   THYROIDECTOMY Left 10/21/2012   Procedure: COMPLETION OF THYROIDECTOMY;  Surgeon: Janita Mellow, MD;  Location: [facility] OR;  Service: ENT;  Laterality: Left;   TISSUE EXPANDER PLACEMENT Right 03/03/2018   Procedure: TISSUE EXPANDER PLACEMENT;  Surgeon: Thornell Flirt, DO;  Location: [facility];  Service: Plastics;  Laterality: Right;   TOTAL KNEE ARTHROPLASTY  04/14/2012   Procedure: TOTAL KNEE ARTHROPLASTY;  Surgeon: Ferd Householder, MD;  Location: [facility] OR;  Service: Orthopedics;  Laterality: Right;  RIGHT ARTHROPLASTY KNEE MEDIAL/LATERAL COMPARTMENTS WITH PATELLA RESURFACING   TOTAL KNEE ARTHROPLASTY Left 03/30/2013   Procedure: TOTAL KNEE ARTHROPLASTY;  Surgeon: Ferd Householder, MD;  Location: [facility] OR;  Service: Orthopedics;  Laterality: Left;   VENTRAL HERNIA REPAIR N/A 02/10/2020   Procedure: LAPAROSCOPIC ASSISTED VENTRAL HERNIA REPAIR;  Surgeon: Caralyn Chandler, MD;  Location: [facility] OR;  Service: General;  Laterality: N/A;   Patient Active Problem List   Diagnosis Date Noted   Breast asymmetry following reconstructive surgery 05/22/2023   Torn earlobe, left, initial encounter 12/17/2018   S/P breast reconstruction, bilateral 03/16/2018   History of breast cancer in female 01/27/2018   History of reconstruction of both breasts 01/27/2018   Acquired absence of right breast 09/23/2017   Breast cancer (HCC) 06/22/2017   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   Genetic testing 04/17/2017   DJD (degenerative joint disease) of knee  03/30/2013   Witnessed episode of apnea 03/23/2013   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   Weakness 11/28/2011   Hypertension 11/28/2011   Hypokalemia 11/28/2011   LUE weakness 11/28/2011   Hypothyroidism 11/28/2011   Thyroid  cancer (HCC) 04/28/2010    ONSET DATE: DOS 07/23/23  REFERRING DIAG: M18.12 (ICD-10-CM) - Arthritis of carpometacarpal (CMC) joint of left thumb   THERAPY DIAG:  Localized edema  Muscle weakness (generalized)  Pain in left hand  Rationale for Evaluation and Treatment: Rehabilitation  PERTINENT HISTORY: Arthritis, breast cancer survivor, chronic low back pain, history of thyroid  issues and cancer as well. She states working from home for [employer], but will retire in May. She states no significant pain now.     PRECAUTIONS: None  RED FLAGS: None   WEIGHT BEARING RESTRICTIONS: Yes no weightbearing in the left hand and thumb now    SUBJECTIVE:   SUBJECTIVE STATEMENT: 7 weeks s/p Lt thumb CMC J arthroplasty. She states she was away last week on a trip to [place]  and unfortunately lost  her new hand-based brace.  She has not been having much pain, has been trying to wean herself from her braces a bit.    PAIN:  Are you having pain? Yes: NPRS scale: none now but up to   0/10 at worst in past week  Pain location: Left thumb and wrist Pain description: Aching and sore Aggravating factors: Motion and weightbearing Relieving factors: Rest   PATIENT GOALS: Improve use of left hand and arm safely to return to normal activities  NEXT MD VISIT: As needed    OBJECTIVE: (All objective assessments below are from initial evaluation on: 08/13/23 unless otherwise specified.)   HAND DOMINANCE: Right   ADLs: Overall ADLs: States decreased ability to grab, hold household objects, pain and difficulty to open containers, perform FMS tasks (manipulate fasteners on clothing), mild to moderate bathing problems as well.    FUNCTIONAL OUTCOME MEASURES: Eval: Patient Specific  Functional Scale: 5.3 (grab objects, dressed, open a jar)  (Higher Score  =  Better Ability for the Selected Tasks)       UPPER EXTREMITY ROM     Shoulder to Wrist AROM Left eval Lt 08/20/23 Lt 08/24/23 Lt 09/09/23  Shoulder flexion      Shoulder abduction      Shoulder extension      Shoulder internal rotation      Shoulder external rotation      Elbow flexion      Elbow extension      Forearm supination 77     Forearm pronation  80     Wrist flexion 45 34 35 34  Wrist extension 49 53 55 54  Wrist ulnar deviation      Wrist radial deviation      Functional dart thrower's motion (F-DTM) in ulnar flexion      F-DTM in radial extension       (Blank rows = not tested)   Hand AROM Left eval Lt 08/20/23 Lt 08/24/23 Lt 09/09/23  Full Fist Ability (or Gap to Distal Palmar Crease) Full but loose      Thumb Opposition  (Kapandji Scale)   8/10 8/ 10 8/10  Thumb MCP (0-60) 0- 27 0 - 30 0 - 38 0 - 34  Thumb IP (0-80) +15 - 20 (+15) - 52 (+15) - 47 (+15) - 45  Thumb Radial Abduction Span       Thumb Palmar Abduction Span       Index MCP (0-90)       Index PIP (0-100)       Index DIP (0-70)       Long MCP (0-90)       Long PIP (0-100)       Long DIP (0-70)       Ring MCP (0-90)       Ring PIP (0-100)       Ring DIP (0-70)       Little MCP (0-90)       Little PIP (0-100)       Little DIP (0-70)       (Blank rows = not tested)   UPPER EXTREMITY MMT:    Eval:  NT at eval due to recent and still healing injuries, but apparently 3 -/5 for most motions at the hand and wrist. Will be tested when appropriate.   MMT Left TBD  Shoulder flexion   Shoulder abduction   Shoulder adduction   Shoulder extension   Shoulder internal rotation   Shoulder external rotation  Middle trapezius   Lower trapezius   Elbow flexion   Elbow extension   Forearm supination   Forearm pronation   Wrist flexion   Wrist extension   Wrist ulnar deviation   Wrist radial deviation   (Blank rows =  not tested)  HAND FUNCTION: Eval: Observed weakness in affected left hand.  Details will be tested when safe and appropriate Grip strength Right: TBD lbs, Left: TBD lbs   COORDINATION: TBD: 9 Hole Peg Test Left: TBD sec (approximately 25 seconds is WFL)    SENSATION: Eval:  Light touch intact today, though diminished around sx area    EDEMA:   Eval:  Mildly swollen in left hand and wrist today  OBSERVATIONS:   Eval: Surgical area looking well-healing, no Steri-Strips on now, not overly hypersensitive but some mix of paresthesia present. Left thumb CMC arthroplasty   TODAY'S TREATMENT:  09/09/23: A check of range of motion unfortunately shows lingering stiffness at the wrist and thumb, could be due to being away on a trip, not wearing corrective orthosis because she lost it, not being consistent with exercises.  She was also not here to get necessary upgrades.    Today OT does upgrade her home exercise program to include thumb stretches as bolded below which she tolerates well with explanation and demonstration.  Next, we discussed her starting to wean from orthoses for light functional tasks during the day that way less than 5 pounds, do not hurt, but have her get moving and get some healthy fatigue.  She states understanding this.    Lastly, OT adjusts her old forearm-based orthosis down to a hand-based orthosis to compensate for the 1 that she lost in [place]  recently.  When completed, she states it fits well, applies no pressure, does feel supportive to her thumb.    She leaves in no significant pain stating understanding the need to continue to stretch and exercise and slowly wean out of her orthoses for light functional activities.     Exercises - Wrist Flexion Stretch  - 4 x daily - 3-5 reps - 15 sec hold - Wrist Prayer Stretch  - 4 x daily - 3-5 reps - 15 sec hold - Stretch thumb toward base of small finger (put hand in LAP)  - 2-3 x daily - 3-5 reps - 15 sec hold - Thumb PROM  Composite Extension  - 2-3 x daily - 3 reps - 15 sec hold - Thumb Opposition  - 4-6 x daily - 10 reps    PATIENT EDUCATION: Education details: See tx section above for details  Person educated: Patient Education method: Verbal Instruction, Teach back, Handouts  Education comprehension: States and demonstrates understanding, Additional Education required    HOME EXERCISE PROGRAM: Access Code: [email or web address] URL: [email or web address] Date: 08/13/2023 Prepared by: Leartis Proud   GOALS: Goals reviewed with patient? Yes   SHORT TERM GOALS: (STG required if POC>30 days) Target Date: 08/28/2023  Pt will obtain protective, custom orthotic. Goal status: 08/13/2023: MET   2.  Pt will demo/state understanding of initial HEP to improve pain levels and prerequisite motion. Goal status: 08/24/23: MET   LONG TERM GOALS: Target Date: 09/25/2023  Pt will improve functional ability by decreased impairment per PSFS assessment from 5.3 to 7.3 or better, for better quality of life. Goal status: INITIAL  2.  Pt will improve grip strength in left nondominant hand hand from unsafe to test lbs to at least 25 lbs for functional use at home and in  IADLs. Goal status: INITIAL  3.  Pt will improve A/ROM in left wrist flexion/extension from 45/49 respectively to at least 60 degrees each, to have functional motion for tasks like reach and grasp.  Goal status: INITIAL  4.  Pt will improve strength in left wrist flexion/extension from apparent 3 -/5 MMT to at least 4+/5 MMT to have increased functional ability to carry out selfcare and higher-level homecare tasks with less difficulty. Goal status: INITIAL  5.  Pt will improve coordination skills in left hand, as seen by within functional limit score on nine-hole peg testing to have increased functional ability to carry out fine motor tasks (fasteners, etc.) and more complex, coordinated IADLs (meal prep, sports, etc.).  Goal status:  INITIAL  6.  Pt will decrease pain at worst from 3/10 to 1/10 or better to have better sleep and occupational participation in daily roles. Goal status: INITIAL     ASSESSMENT:  CLINICAL IMPRESSION: 09/09/23: Unfortunately she is a bit stiff from missing at least a week of therapy and losing her recent brace.  OT will try to get her caught back up and upgraded her to light isometrics or pinching and gripping in the next session as tolerated.     PLAN:  OT FREQUENCY: 1x/week  OT DURATION: 6 weeks through 09/25/2023 and up to 7 total visits as needed  PLANNED INTERVENTIONS: 97168 OT Re-evaluation, 97535 self care/ADL training, 16109 therapeutic exercise, 97530 therapeutic activity, 97112 neuromuscular re-education, 97140 manual therapy, 97035 ultrasound, 97039 fluidotherapy, 97010 moist heat, 97010 cryotherapy, 97760 Orthotic Initial, 97763 Orthotic/Prosthetic subsequent, scar mobilization, compression bandaging, coping strategies training, and patient/family education   CONSULTED AND AGREED WITH PLAN OF CARE: Patient  PLAN FOR NEXT SESSION:   Check Hand-based brace, check motion and stretching and consider manual therapy.  Upgrade to light thumb isometrics or light strengthening soon as tolerated.   Leartis Proud, OTR/L,  CHT 09/09/2023, 12:36 PM

## 2023-09-09 ENCOUNTER — Ambulatory Visit (INDEPENDENT_AMBULATORY_CARE_PROVIDER_SITE_OTHER): Admitting: Rehabilitative and Restorative Service Providers"

## 2023-09-09 ENCOUNTER — Encounter: Payer: Self-pay | Admitting: Rehabilitative and Restorative Service Providers"

## 2023-09-09 DIAGNOSIS — M6281 Muscle weakness (generalized): Secondary | ICD-10-CM | POA: Diagnosis not present

## 2023-09-09 DIAGNOSIS — M79642 Pain in left hand: Secondary | ICD-10-CM | POA: Diagnosis not present

## 2023-09-09 DIAGNOSIS — R600 Localized edema: Secondary | ICD-10-CM

## 2023-09-10 NOTE — Therapy (Signed)
 OUTPATIENT OCCUPATIONAL THERAPY TREATMENT NOTE   Patient Name: [name] MRN: [id number] DOB:02-10-1951, 73 y.o., female Today's Date: 09/14/2023  PCP: Bascom Bossier MD REFERRING PROVIDER: Sandie Cross, PA-C   END OF SESSION:  OT End of Session - 09/14/23 [id number]     Visit Number 5    Number of Visits 7    Date for OT Re-Evaluation 09/25/23    Authorization Type [employer]    OT Start Time [phone]    OT Stop Time 1023    OT Time Calculation (min) 45 min    Equipment Utilized During Treatment --    Activity Tolerance Patient tolerated treatment well;No increased pain;Patient limited by fatigue;Patient limited by pain    Behavior During Therapy [facility] for tasks assessed/performed             Past Medical History:  Diagnosis Date   Anemia    "years ago"   Arthritis    "left hand; was in my knees" (09/24/2017)   Breast cancer, right breast (HCC) 2004   Bruises easily    Chronic lower back pain    Ductal carcinoma in situ (DCIS) of left breast 2018   Genetic testing 04/17/2017   STAT Breast panel with reflex to Multi-Cancer panel (83 genes) @ [facility] - No pathogenic mutations detected   History of bronchitis    when she was young   Hypertension    no longer taking medications as of August 2019   Hypothyroidism    takes Synthroid  daily   Joint pain    Joint swelling    Personal history of chemotherapy 04/2003   Personal history of radiation therapy 2005   Pneumonia    hx of when she was young   Right elbow tendonitis    Sleep apnea    has a machine but does not use (09/24/2017)   Thyroid  cancer (HCC) 2012   Urinary frequency    Past Surgical History:  Procedure Laterality Date   ANTERIOR CERVICAL DECOMP/DISCECTOMY FUSION  2008   APPENDECTOMY  2007   BREAST BIOPSY Right 02/28/2003   malignant   BREAST BIOPSY Left 03/2017   BREAST LUMPECTOMY Right 2004   BREAST RECONSTRUCTION WITH PLACEMENT OF TISSUE EXPANDER AND FLEX HD (ACELLULAR HYDRATED DERMIS) Bilateral 06/22/2017    Procedure: IMMEDIATE BILATERAL BREAST RECONSTRUCTION WITH PLACEMENT OF TISSUE EXPANDER AND FLEX HD (ACELLULAR HYDRATED DERMIS);  Surgeon: Thornell Flirt, DO;  Location: [facility];  Service: Plastics;  Laterality: Bilateral;   CESAREAN SECTION  1981   COLONOSCOPY  X 2   INSERTION OF MESH N/A 02/10/2020   Procedure: INSERTION OF MESH;  Surgeon: Caralyn Chandler, MD;  Location: [facility] OR;  Service: General;  Laterality: N/A;   KNEE ARTHROSCOPY Right 2007   KNEE ARTHROSCOPY  05/02/2011   Procedure: ARTHROSCOPY KNEE;  Surgeon: Loel Ring;  Location: [facility];  Service: Orthopedics;  Laterality: Right;   LATISSIMUS FLAP TO BREAST Right 09/23/2017   Procedure: RIGHT BREAST LATISSIMUS DORSI FLAP RECONSTRUCTION WITH TISSUE EXPANDER;  Surgeon: Thornell Flirt, DO;  Location: [facility] OR;  Service: Plastics;  Laterality: Right;   LIPOSUCTION WITH LIPOFILLING Bilateral 02/24/2019   Procedure: Bilateral lipofilling of breasts and liposuction of lateral breasts for symmetry;  Surgeon: Thornell Flirt, DO;  Location: [facility];  Service: Plastics;  Laterality: Bilateral;  2 hours, please   MASTECTOMY W/ SENTINEL NODE BIOPSY Bilateral 06/22/2017   Procedure: LEFT MASTECTOMY WITH SENTINEL LYMPH NODE BIOPSY AND  RIGHT PROPHYLACTIC MASTECTOMY;  Surgeon: Caralyn Chandler, MD;  Location: [facility];  Service: General;  Laterality: Bilateral;   MENISCUS DEBRIDEMENT  05/02/2011   Procedure: DEBRIDEMENT OF MENISCUS;  Surgeon: Loel Ring;  Location: [facility];  Service: Orthopedics;  Laterality: Right;   PORTA CATH REMOVAL  2005   PORTACATH PLACEMENT  2004   REMOVAL OF BILATERAL TISSUE EXPANDERS WITH PLACEMENT OF BILATERAL BREAST IMPLANTS Bilateral 05/26/2018   Procedure: REMOVAL OF BILATERAL TISSUE EXPANDERS WITH PLACEMENT OF BILATERAL BREAST IMPLANTS;  Surgeon: Thornell Flirt, DO;  Location: [facility];   Service: Plastics;  Laterality: Bilateral;   REMOVAL OF TISSUE EXPANDER AND PLACEMENT OF IMPLANT Right 12/12/2017   Procedure: REMOVAL OF TISSUE EXPANDER, RIGHT;  Surgeon: Alger Infield, MD;  Location: [facility] OR;  Service: Plastics;  Laterality: Right;   THYROIDECTOMY N/A 09/22/2012   Procedure: RIGHT THYROID  LOBECTOMY WITH FROZEN SECTION;  Surgeon: Janita Mellow, MD;  Location: [facility] OR;  Service: ENT;  Laterality: N/A;   THYROIDECTOMY Left 10/21/2012   Procedure: COMPLETION OF THYROIDECTOMY;  Surgeon: Janita Mellow, MD;  Location: [facility] OR;  Service: ENT;  Laterality: Left;   TISSUE EXPANDER PLACEMENT Right 03/03/2018   Procedure: TISSUE EXPANDER PLACEMENT;  Surgeon: Thornell Flirt, DO;  Location:  SUR[facility]ERY [facility];  Service: Plastics;  Laterality: Right;   TOTAL KNEE ARTHROPLASTY  04/14/2012   Procedure: TOTAL KNEE ARTHROPLASTY;  Surgeon: Ferd Householder, MD;  Location: [facility] OR;  Service: Orthopedics;  Laterality: Right;  RIGHT ARTHROPLASTY KNEE MEDIAL/LATERAL COMPARTMENTS WITH PATELLA RESURFACING   TOTAL KNEE ARTHROPLASTY Left 03/30/2013   Procedure: TOTAL KNEE ARTHROPLASTY;  Surgeon: Ferd Householder, MD;  Location: [facility] OR;  Service: Orthopedics;  Laterality: Left;   VENTRAL HERNIA REPAIR N/A 02/10/2020   Procedure: LAPAROSCOPIC ASSISTED VENTRAL HERNIA REPAIR;  Surgeon: Caralyn Chandler, MD;  Location: [facility] OR;  Service: General;  Laterality: N/A;   Patient Active Problem List   Diagnosis Date Noted   Breast asymmetry following reconstructive surgery 05/22/2023   Torn earlobe, left, initial encounter 12/17/2018   S/P breast reconstruction, bilateral 03/16/2018   History of breast cancer in female 01/27/2018   History of reconstruction of both breasts 01/27/2018   Acquired absence of right breast 09/23/2017   Breast cancer (HCC) 06/22/2017   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   Genetic testing 04/17/2017   DJD (degenerative joint disease) of knee 03/30/2013   Witnessed  episode of apnea 03/23/2013   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   Weakness 11/28/2011   Hypertension 11/28/2011   Hypokalemia 11/28/2011   LUE weakness 11/28/2011   Hypothyroidism 11/28/2011   Thyroid  cancer (HCC) 04/28/2010    ONSET DATE: DOS 07/23/23  REFERRING DIAG: M18.12 (ICD-10-CM) - Arthritis of carpometacarpal (CMC) joint of left thumb   THERAPY DIAG:  Localized edema  Muscle weakness (generalized)  Pain in left hand  Rationale for Evaluation and Treatment: Rehabilitation  PERTINENT HISTORY: Arthritis, breast cancer survivor, chronic low back pain, history of thyroid  issues and cancer as well. She states working from home for [employer], but will retire in May. She states no significant pain now.     PRECAUTIONS: None  RED FLAGS: None   WEIGHT BEARING RESTRICTIONS: Yes no weightbearing in the left hand and thumb now    SUBJECTIVE:   SUBJECTIVE STATEMENT: 7+ weeks s/p Lt thumb CMC J arthroplasty. She states she is trying to weaning from braces for light things and having some soreness,  which is normal through 10 weeks postop  PAIN:  Are you having pain? None now    PATIENT GOALS: Improve use of left hand and arm safely to return to normal activities  NEXT MD VISIT: As needed    OBJECTIVE: (All objective assessments below are from initial evaluation on: 08/13/23 unless otherwise specified.)   HAND DOMINANCE: Right   ADLs: Overall ADLs: States decreased ability to grab, hold household objects, pain and difficulty to open containers, perform FMS tasks (manipulate fasteners on clothing), mild to moderate bathing problems as well.    FUNCTIONAL OUTCOME MEASURES: Eval: Patient Specific Functional Scale: 5.3 (grab objects, dressed, open a jar)  (Higher Score  =  Better Ability for the Selected Tasks)       UPPER EXTREMITY ROM     Shoulder to Wrist AROM Left eval Lt 08/20/23 Lt 08/24/23 Lt 09/09/23 Lt 09/14/23  Forearm supination 77      Forearm pronation  80       Wrist flexion 45 34 35 34 25  Wrist extension 49 53 55 54 54  (Blank rows = not tested)   Hand AROM Left eval Lt 08/20/23 Lt 08/24/23 Lt 09/09/23 Lt 09/14/23  Full Fist Ability (or Gap to Distal Palmar Crease) Full but loose       Thumb Opposition  (Kapandji Scale)   8/10 8/ 10 8/10   Thumb MCP (0-60) 0- 27 0 - 30 0 - 38 0 - 34 0 - 42  Thumb IP (0-80) +15 - 20 (+15) - 52 (+15) - 47 (+15) - 45 (+15) -51  Thumb Radial Abduction Span        Thumb Palmar Abduction Span        (Blank rows = not tested)   UPPER EXTREMITY MMT:    Eval:  NT at eval due to recent and still healing injuries, but apparently 3 -/5 for most motions at the hand and wrist. Will be tested when appropriate.   MMT Left TBD  Shoulder flexion   Shoulder abduction   Shoulder adduction   Shoulder extension   Shoulder internal rotation   Shoulder external rotation   Middle trapezius   Lower trapezius   Elbow flexion   Elbow extension   Forearm supination   Forearm pronation   Wrist flexion   Wrist extension   Wrist ulnar deviation   Wrist radial deviation   (Blank rows = not tested)  HAND FUNCTION: Eval: Observed weakness in affected left hand.  Details will be tested when safe and appropriate Grip strength Right: TBD lbs, Left: TBD lbs   COORDINATION: TBD: 9 Hole Peg Test Left: TBD sec (approximately 25 seconds is WFL)    SENSATION: Eval:  Light touch intact today, though diminished around sx area    EDEMA:   Eval:  Mildly swollen in left hand and wrist today  OBSERVATIONS:   Eval: Surgical area looking well-healing, no Steri-Strips on now, not overly hypersensitive but some mix of paresthesia present. Left thumb CMC arthroplasty   TODAY'S TREATMENT:  09/14/23: She performs active range of motion today for new measures showing continued tightness in wrist flexion.  Thumb is improving which is fortunate but we discussed her continuing to stretch her wrist in flexion and extension without pain  several times a day.  These were performed with her and she tolerated them well today.  Her new thumb stretches were also reviewed as well as weaning from orthosis for light functional activities.  If she is  having increased pain she was courage to continue to wear her orthosis more often, but light gentle motion is encouraged if not very repetitive.  As her MCP joints of the left hand digits 2 and 3 are somewhat swollen today and tender, OT educates Antony Baumgartner back knuckle stretch that she can perform several times a day to help with arthritis that may be there.  Lastly, OT educates on new isometric exercises to do for thumb abduction, adduction, flexion, for dynamic stabilization of the thumb joint.  She should do these things 3 times a day at most, 5 times each position, holding it for 5 to 10 seconds as tolerated.  She can also add these in for wrist flexion and extension in a similar fashion.  She tolerated them well without pain today and leaves stating understanding.    PATIENT EDUCATION: Education details: See tx section above for details  Person educated: Patient Education method: Verbal Instruction, Teach back, Handouts  Education comprehension: States and demonstrates understanding, Additional Education required    HOME EXERCISE PROGRAM: Access Code: [email or web address] URL: [email or web address] Date: 08/13/2023 Prepared by: Leartis Proud   GOALS: Goals reviewed with patient? Yes   SHORT TERM GOALS: (STG required if POC>30 days) Target Date: 08/28/2023  Pt will obtain protective, custom orthotic. Goal status: 08/13/2023: MET   2.  Pt will demo/state understanding of initial HEP to improve pain levels and prerequisite motion. Goal status: 08/24/23: MET   LONG TERM GOALS: Target Date: 09/25/2023  Pt will improve functional ability by decreased impairment per PSFS assessment from 5.3 to 7.3 or better, for better quality of life. Goal status: INITIAL  2.  Pt will improve  grip strength in left nondominant hand hand from unsafe to test lbs to at least 25 lbs for functional use at home and in IADLs. Goal status: INITIAL  3.  Pt will improve A/ROM in left wrist flexion/extension from 45/49 respectively to at least 60 degrees each, to have functional motion for tasks like reach and grasp.  Goal status: INITIAL  4.  Pt will improve strength in left wrist flexion/extension from apparent 3 -/5 MMT to at least 4+/5 MMT to have increased functional ability to carry out selfcare and higher-level homecare tasks with less difficulty. Goal status: INITIAL  5.  Pt will improve coordination skills in left hand, as seen by within functional limit score on nine-hole peg testing to have increased functional ability to carry out fine motor tasks (fasteners, etc.) and more complex, coordinated IADLs (meal prep, sports, etc.).  Goal status: INITIAL  6.  Pt will decrease pain at worst from 3/10 to 1/10 or better to have better sleep and occupational participation in daily roles. Goal status: INITIAL     ASSESSMENT:  CLINICAL IMPRESSION: 09/14/23: Need to perform a progress note in the next session, also consider updating the plan of care to include some right shoulder complaints that she has had recently if the doctor agrees.     PLAN:  OT FREQUENCY: 1x/week  OT DURATION: 6 weeks through 09/25/2023 and up to 7 total visits as needed  PLANNED INTERVENTIONS: 97168 OT Re-evaluation, 97535 self care/ADL training, 65784 therapeutic exercise, 97530 therapeutic activity, 97112 neuromuscular re-education, 97140 manual therapy, 97035 ultrasound, 97039 fluidotherapy, 97010 moist heat, 97010 cryotherapy, 97760 Orthotic Initial, 97763 Orthotic/Prosthetic subsequent, scar mobilization, compression bandaging, coping strategies training, and patient/family education   CONSULTED AND AGREED WITH PLAN OF CARE: Patient  PLAN FOR NEXT SESSION:   Progress note and check on new isometric  strengthening exercises.  Upgrade to therapy putty as tolerated.   Leartis Proud, OTR/L,  CHT 09/14/2023, 10:31 AM

## 2023-09-11 ENCOUNTER — Ambulatory Visit: Admitting: Orthopaedic Surgery

## 2023-09-14 ENCOUNTER — Encounter: Payer: Self-pay | Admitting: Rehabilitative and Restorative Service Providers"

## 2023-09-14 ENCOUNTER — Ambulatory Visit: Admitting: Rehabilitative and Restorative Service Providers"

## 2023-09-14 DIAGNOSIS — R600 Localized edema: Secondary | ICD-10-CM

## 2023-09-14 DIAGNOSIS — M6281 Muscle weakness (generalized): Secondary | ICD-10-CM

## 2023-09-14 DIAGNOSIS — M79642 Pain in left hand: Secondary | ICD-10-CM | POA: Diagnosis not present

## 2023-09-22 ENCOUNTER — Ambulatory Visit: Payer: No Typology Code available for payment source | Admitting: Dermatology

## 2023-09-22 ENCOUNTER — Encounter: Payer: Self-pay | Admitting: Dermatology

## 2023-09-22 ENCOUNTER — Ambulatory Visit: Admitting: Orthopaedic Surgery

## 2023-09-22 DIAGNOSIS — L729 Follicular cyst of the skin and subcutaneous tissue, unspecified: Secondary | ICD-10-CM

## 2023-09-22 DIAGNOSIS — L811 Chloasma: Secondary | ICD-10-CM

## 2023-09-22 MED ORDER — SAFETY SEAL MISCELLANEOUS MISC: 2 refills | Status: AC

## 2023-09-22 NOTE — Patient Instructions (Addendum)
 Date: Tue Sep 22 2023  Hello Aarna,  Thank you for visiting today. Here is a summary of the key instructions:  - Skin Care:   - Apply prescription melasminic cream (tranexamic acid) to face:     - First month: Focus on dark patches     - After first month: Apply to whole face   - Use sunscreen every day, even on cloudy days   - For direct sun exposure, use Neutrogena Sheer Mineral Sunscreen Stick  - Medications:   - Prescription for melasminic cream will be sent to a special pharmacy  - Follow-up:   - Genevia Kern will review after-visit summary and provide more information  - Samples:   - Will receive samples of different sunscreens to try   - Will receive samples of facial cleansers by La Roche-Posay and CeraVe  We look forward to seeing the positive changes in your next visit. If you have any questions or concerns before then, please do not hesitate to contact our office.  Warm regards,  Dr. Louana Roup, Dermatology   Important Information  Due to recent changes in healthcare laws, you may see results of your pathology and/or laboratory studies on MyChart before the doctors have had a chance to review them. We understand that in some cases there may be results that are confusing or concerning to you. Please understand that not all results are received at the same time and often the doctors may need to interpret multiple results in order to provide you with the best plan of care or course of treatment. Therefore, we ask that you please give us  2 business days to thoroughly review all your results before contacting the office for clarification. Should we see a critical lab result, you will be contacted sooner.   If You Need Anything After Your Visit  If you have any questions or concerns for your doctor, please call our main line at [phone] If no one answers, please leave a voicemail as directed and we will return your call as soon as possible. Messages left after 4 pm will  be answered the following business day.   You may also send us  a message via MyChart. We typically respond to MyChart messages within 1-2 business days.  For prescription refills, please ask your pharmacy to contact our office. Our fax number is [phone].  If you have an urgent issue when the clinic is closed that cannot wait until the next business day, you can page your doctor at the number below.    Please note that while we do our best to be available for urgent issues outside of office hours, we are not available 24/7.   If you have an urgent issue and are unable to reach us , you may choose to seek medical care at your doctor's office, retail clinic, urgent care center, or emergency room.  If you have a medical emergency, please immediately call 911 or go to the emergency department. In the event of inclement weather, please call our main line at [phone] for an update on the status of any delays or closures.  Dermatology Medication Tips: Please keep the boxes that topical medications come in in order to help keep track of the instructions about where and how to use these. Pharmacies typically print the medication instructions only on the boxes and not directly on the medication tubes.   If your medication is too expensive, please contact our office at [phone] or send us  a message through MyChart.  We are unable to tell what your co-pay for medications will be in advance as this is different depending on your insurance coverage. However, we may be able to find a substitute medication at lower cost or fill out paperwork to get insurance to cover a needed medication.   If a prior authorization is required to get your medication covered by your insurance company, please allow us  1-2 business days to complete this process.  Drug prices often vary depending on where the prescription is filled and some pharmacies may offer cheaper prices.  The website www.goodrx.com contains  coupons for medications through different pharmacies. The prices here do not account for what the cost may be with help from insurance (it may be cheaper with your insurance), but the website can give you the price if you did not use any insurance.  - You can print the associated coupon and take it with your prescription to the pharmacy.  - You may also stop by our office during regular business hours and pick up a GoodRx coupon card.  - If you need your prescription sent electronically to a different pharmacy, notify our office through [facility] or by phone at [phone]

## 2023-09-22 NOTE — Progress Notes (Signed)
 New Patient Visit   Subjective  [name] is a 73 y.o. female who presents for the following: New Pt - Cyst  Patient states she has a cyst located at the L Cheek that she would like to have examined. Patient reports the areas have been there for 40 years. She reports the areas are not bothersome stating "its just there". Patient rates irritation 0 out of 10. Pt mentioned that sometimes she is able to get the contents out but it eventually comes back. She states that the areas have not spread. Patient reports she has not previously been treated for these areas. Patient denied Hx of bx. Patient denied family history of skin cancer(s).   The following portions of the chart were reviewed this encounter and updated as appropriate: medications, allergies, medical history  Review of Systems:  No other skin or systemic complaints except as noted in HPI or Assessment and Plan.  Objective  Well appearing patient in no apparent distress; mood and affect are within normal limits.  A full examination was performed including scalp, head, eyes, ears, nose, lips, neck, chest, axillae, abdomen, back, buttocks, bilateral upper extremities, bilateral lower extremities, hands, feet, fingers, toes, fingernails, and toenails. All findings within normal limits unless otherwise noted below.    A focused examination was performed of the following areas: face   Relevant exam findings are noted in the Assessment and Plan.        Assessment & Plan    1. Sebaceous cyst - Assessment:  Patient has a long-standing sebaceous cyst present for 40 years. Cyst comes and goes, with patient squeezing out contents periodically, most recently about a month to a month and a half ago.  Cyst flattens with pressure but can still be felt underneath. Presentation consistent with benign sebaceous cyst  - Plan:    Reassurance provided about benign nature of the cyst    Patient education:     - Explained etiology and  natural history of sebaceous cysts    Recommend observation as the cyst flattens with expression    Surgical removal discussed as option for permanent resolution, but not pursued due to potential scarring    Patient decided to leave the cyst as is without further intervention  2. Melasma - Assessment:  Patient presents with brown hyperpigmented patches on bilateral cheeks, temples, and forehead, consistent with melasma. Condition developed over past 6 months, worsening after a cruise in December. Timing suggests potential trigger from sun exposure and possible hormonal changes. Melasma known to be highly sensitive to sun exposure and can be exacerbated by hormonal fluctuations.  - Plan:    Emphasize importance of daily sunscreen use, even on cloudy days    Prescribe tranexamic acid-containing melasminic cream from specialty pharmacy     - Patient education on application: focus on dark patches for first month, then whole face for blending    Recommend Neutrogena Sheer Mineral Sunscreen Stick for direct sun exposure    Patient education:     - Emphasize need for consistent sun protection to prevent worsening and recurrence    Provide samples of facial cleansers (La Roche-Posay and CeraVe brands)    Include pictures of recommended products in after-visit summary MELASMA   Related Medications Safety Seal Miscellaneous MISC Melaxemic Cream with tranexamic acid 5% kojic acid usp 2% vit c USP 2.5% hyaluronic acid excp 0.1% - apply to face twice daily.  Return in about 4 months (around 01/23/2024) for melasma & cyst.  Documentation: I have reviewed the above documentation for accuracy and completeness, and I agree with the above.  I, Shirron Louanne Roussel, CMA, am acting as scribe for [employer], DO.   Louana Roup, DO

## 2023-09-23 ENCOUNTER — Ambulatory Visit: Admitting: Orthopaedic Surgery

## 2023-09-23 ENCOUNTER — Encounter: Admitting: Rehabilitative and Restorative Service Providers"

## 2023-09-25 NOTE — Therapy (Signed)
 OUTPATIENT OCCUPATIONAL THERAPY TREATMENT & progress NOTE   Patient Name: [name] MRN: [id number] DOB:1950-10-19, 73 y.o., female Today's Date: 09/28/2023  PCP: Bascom Bossier MD REFERRING PROVIDER: Sandie Cross, PA-C             Progress Note Reporting Period 09/25/23 to 11/06/23 ASSESSMENT:  CLINICAL IMPRESSION: 09/28/23: She is meant to out of 6 of her long-term goals and has made progress towards the remaining 4.  Unfortunately she has missed a couple of therapy sessions, and she could use additional sessions, so OT will request additional authorization.  We will also be addressing some acute on chronic right sided shoulder stiffness and pain that may be a sequela of not using her hand and arm much on the left side.    PLAN:  OT FREQUENCY: 1-2 / week  OT DURATION: 6 weeks through 09/25/2023 - 11/06/23  and up to 14 total visits as needed  See note below for Objective Data and Assessment of Progress/Goals.           END OF SESSION:  OT End of Session - 09/28/23 1422     Visit Number 6    Number of Visits 14    Date for OT Re-Evaluation 11/06/23    Authorization Type [employer]    OT Start Time 1423    OT Stop Time 1518    OT Time Calculation (min) 55 min    Activity Tolerance Patient tolerated treatment well;No increased pain;Patient limited by fatigue;Patient limited by pain    Behavior During Therapy [facility] for tasks assessed/performed              Past Medical History:  Diagnosis Date   Anemia    "years ago"   Arthritis    "left hand; was in my knees" (09/24/2017)   Breast cancer, right breast (HCC) 2004   Bruises easily    Chronic lower back pain    Ductal carcinoma in situ (DCIS) of left breast 2018   Genetic testing 04/17/2017   STAT Breast panel with reflex to Multi-Cancer panel (83 genes) @ [facility] - No pathogenic mutations detected   History of bronchitis    when she was young   Hypertension    no longer taking medications as of August  2019   Hypothyroidism    takes Synthroid  daily   Joint pain    Joint swelling    Personal history of chemotherapy 04/2003   Personal history of radiation therapy 2005   Pneumonia    hx of when she was young   Right elbow tendonitis    Sleep apnea    has a machine but does not use (09/24/2017)   Thyroid  cancer (HCC) 2012   Urinary frequency    Past Surgical History:  Procedure Laterality Date   ANTERIOR CERVICAL DECOMP/DISCECTOMY FUSION  2008   APPENDECTOMY  2007   BREAST BIOPSY Right 02/28/2003   malignant   BREAST BIOPSY Left 03/2017   BREAST LUMPECTOMY Right 2004   BREAST RECONSTRUCTION WITH PLACEMENT OF TISSUE EXPANDER AND FLEX HD (ACELLULAR HYDRATED DERMIS) Bilateral 06/22/2017   Procedure: IMMEDIATE BILATERAL BREAST RECONSTRUCTION WITH PLACEMENT OF TISSUE EXPANDER AND FLEX HD (ACELLULAR HYDRATED DERMIS);  Surgeon: Thornell Flirt, DO;  Location: [facility];  Service: Plastics;  Laterality: Bilateral;   CESAREAN SECTION  1981   COLONOSCOPY  X 2   INSERTION OF MESH N/A 02/10/2020   Procedure: INSERTION OF MESH;  Surgeon: Caralyn Chandler, MD;  Location: [facility]  OR;  Service: General;  Laterality: N/A;   KNEE ARTHROSCOPY Right 2007   KNEE ARTHROSCOPY  05/02/2011   Procedure: ARTHROSCOPY KNEE;  Surgeon: Loel Ring;  Location: [facility];  Service: Orthopedics;  Laterality: Right;   LATISSIMUS FLAP TO BREAST Right 09/23/2017   Procedure: RIGHT BREAST LATISSIMUS DORSI FLAP RECONSTRUCTION WITH TISSUE EXPANDER;  Surgeon: Thornell Flirt, DO;  Location: [facility] OR;  Service: Plastics;  Laterality: Right;   LIPOSUCTION WITH LIPOFILLING Bilateral 02/24/2019   Procedure: Bilateral lipofilling of breasts and liposuction of lateral breasts for symmetry;  Surgeon: Thornell Flirt, DO;  Location: [facility];  Service: Plastics;  Laterality: Bilateral;  2 hours, please   MASTECTOMY W/ SENTINEL NODE BIOPSY Bilateral 06/22/2017   Procedure: LEFT  MASTECTOMY WITH SENTINEL LYMPH NODE BIOPSY AND RIGHT PROPHYLACTIC MASTECTOMY;  Surgeon: Caralyn Chandler, MD;  Location: [facility];  Service: General;  Laterality: Bilateral;   MENISCUS DEBRIDEMENT  05/02/2011   Procedure: DEBRIDEMENT OF MENISCUS;  Surgeon: Loel Ring;  Location: [facility];  Service: Orthopedics;  Laterality: Right;   PORTA CATH REMOVAL  2005   PORTACATH PLACEMENT  2004   REMOVAL OF BILATERAL TISSUE EXPANDERS WITH PLACEMENT OF BILATERAL BREAST IMPLANTS Bilateral 05/26/2018   Procedure: REMOVAL OF BILATERAL TISSUE EXPANDERS WITH PLACEMENT OF BILATERAL BREAST IMPLANTS;  Surgeon: Thornell Flirt, DO;  Location: [facility];  Service: Plastics;  Laterality: Bilateral;   REMOVAL OF TISSUE EXPANDER AND PLACEMENT OF IMPLANT Right 12/12/2017   Procedure: REMOVAL OF TISSUE EXPANDER, RIGHT;  Surgeon: Alger Infield, MD;  Location: [facility] OR;  Service: Plastics;  Laterality: Right;   THYROIDECTOMY N/A 09/22/2012   Procedure: RIGHT THYROID  LOBECTOMY WITH FROZEN SECTION;  Surgeon: Janita Mellow, MD;  Location: [facility] OR;  Service: ENT;  Laterality: N/A;   THYROIDECTOMY Left 10/21/2012   Procedure: COMPLETION OF THYROIDECTOMY;  Surgeon: Janita Mellow, MD;  Location: [facility] OR;  Service: ENT;  Laterality: Left;   TISSUE EXPANDER PLACEMENT Right 03/03/2018   Procedure: TISSUE EXPANDER PLACEMENT;  Surgeon: Thornell Flirt, DO;  Location: [facility];  Service: Plastics;  Laterality: Right;   TOTAL KNEE ARTHROPLASTY  04/14/2012   Procedure: TOTAL KNEE ARTHROPLASTY;  Surgeon: Ferd Householder, MD;  Location: [facility] OR;  Service: Orthopedics;  Laterality: Right;  RIGHT ARTHROPLASTY KNEE MEDIAL/LATERAL COMPARTMENTS WITH PATELLA RESURFACING   TOTAL KNEE ARTHROPLASTY Left 03/30/2013   Procedure: TOTAL KNEE ARTHROPLASTY;  Surgeon: Ferd Householder, MD;  Location: [facility] OR;  Service: Orthopedics;  Laterality: Left;   VENTRAL HERNIA REPAIR N/A 02/10/2020    Procedure: LAPAROSCOPIC ASSISTED VENTRAL HERNIA REPAIR;  Surgeon: Caralyn Chandler, MD;  Location: [facility] OR;  Service: General;  Laterality: N/A;   Patient Active Problem List   Diagnosis Date Noted   Breast asymmetry following reconstructive surgery 05/22/2023   Torn earlobe, left, initial encounter 12/17/2018   S/P breast reconstruction, bilateral 03/16/2018   History of breast cancer in female 01/27/2018   History of reconstruction of both breasts 01/27/2018   Acquired absence of right breast 09/23/2017   Breast cancer (HCC) 06/22/2017   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   Genetic testing 04/17/2017   DJD (degenerative joint disease) of knee 03/30/2013   Witnessed episode of apnea 03/23/2013   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   Weakness 11/28/2011   Hypertension 11/28/2011   Hypokalemia 11/28/2011   LUE weakness 11/28/2011   Hypothyroidism 11/28/2011   Thyroid  cancer (  HCC) 04/28/2010    ONSET DATE: DOS 07/23/23  REFERRING DIAG: X09.60 (ICD-10-CM) - Arthritis of carpometacarpal (CMC) joint of left thumb   THERAPY DIAG:  Pain in left hand - Plan: Ot plan of care cert/re-cert  Localized edema - Plan: Ot plan of care cert/re-cert  Muscle weakness (generalized) - Plan: Ot plan of care cert/re-cert  Stiffness of right shoulder, not elsewhere classified - Plan: Ot plan of care cert/re-cert  Chronic right shoulder pain - Plan: Ot plan of care cert/re-cert  Rationale for Evaluation and Treatment: Rehabilitation  PERTINENT HISTORY: Arthritis, breast cancer survivor, chronic low back pain, history of thyroid  issues and cancer as well. She states working from home for [employer], but will retire in May. She states no significant pain now.     PRECAUTIONS: None  RED FLAGS: None   WEIGHT BEARING RESTRICTIONS: Yes no weightbearing in the left hand and thumb now    SUBJECTIVE:   SUBJECTIVE STATEMENT: 8 weeks s/p Lt thumb CMC J arthroplasty. She states she had to miss  her last session due to a plumber being at her home.  She states her thumb is doing better but she does have some new paresthesia-like symptoms at times in the small finger.  She also states that her right shoulder has been problematic, and though it is chronic for her, it may have been exacerbated by less use now with the left hand during this recovery.  OT will briefly assess it and add it to the plan of care today if the referring provider agrees    PAIN:  Are you having pain? 6.5/10 in ulnar side of Lt hand today (shooting, nerve-like)   PATIENT GOALS: Improve use of left hand and arm safely to return to normal activities  NEXT MD VISIT: As needed    OBJECTIVE: (All objective assessments below are from initial evaluation on: 08/13/23 unless otherwise specified.)   HAND DOMINANCE: Right   ADLs: Overall ADLs: States decreased ability to grab, hold household objects, pain and difficulty to open containers, perform FMS tasks (manipulate fasteners on clothing), mild to moderate bathing problems as well.    FUNCTIONAL OUTCOME MEASURES: 09/28/23: PSFS: 8.3  Eval: Patient Specific Functional Scale: 5.3 (grab objects, dressed, open a jar)  (Higher Score  =  Better Ability for the Selected Tasks)       UPPER EXTREMITY ROM     Shoulder to Wrist AROM Left eval Lt 08/20/23 Lt 08/24/23 Lt 09/09/23 Lt 09/14/23 Lt 09/28/23  Shoulder Flexion      124   (146 in Rt)   Sh ER      60  Sh IR      23  Forearm supination 77       Forearm pronation  80       Wrist flexion 45 34 35 34 25 49  Wrist extension 49 53 55 54 54 68  (Blank rows = not tested)   Hand AROM Left eval Lt 08/20/23 Lt 08/24/23 Lt 09/09/23 Lt 09/14/23 Lt 09/28/23  Full Fist Ability (or Gap to Distal Palmar Crease) Full but loose      full  Thumb Opposition  (Kapandji Scale)   8/10 8/ 10 8/10  9/10  Thumb MCP (0-60) 0- 27 0 - 30 0 - 38 0 - 34 0 - 42 0 - 41  Thumb IP (0-80) +15 - 20 (+15) - 52 (+15) - 47 (+15) - 45 (+15) -51  (+15) - 54  Thumb Radial Abduction Span  Thumb Palmar Abduction Span         (Blank rows = not tested)    HAND FUNCTION: 09/28/23: Grip strength Right: 52 lbs, Left: 22 lbs  no significant pain   COORDINATION: 09/28/23: 9 Hole Peg Test Left: 27 sec (approximately 27 seconds is WFL)    SENSATION: Eval:  Light touch intact today, though diminished around sx area    EDEMA:   Eval:  Mildly swollen in left hand and wrist today  OBSERVATIONS:   Eval: Surgical area looking well-healing, no Steri-Strips on now, not overly hypersensitive but some mix of paresthesia present. Left thumb CMC arthroplasty   TODAY'S TREATMENT:  09/28/23: Pt performs AROM, gripping, and strength with left thumb/hand/arm against therapist's resistance for exercise/activities as well as new measures today. OT also discusses home and functional tasks with the pt and reviews goals.  She rates her ability much higher now per PSFS, but is still rating pain rather high and describes it almost like ulnar nerve neuropathy.  To help with that, OT as ulnar nerve glides and "waitress carry" nerve glides to her plan today, removes isometric thumb strengthening, and adds new dynamic therapy putty strengthening to the plan of care and HEP today.  These were explained, demonstrated and she demonstrates back with no significant pain or problems.  For her safety/self-care she was recommended to still avoid any painful heavy pushing pulling or gripping with the left hand for at least 2 more weeks.  She performs active range of motion of the right shoulder for new baseline measures which does show a pattern of tight internal rotation and weak/tight posterior capsule.  This could possibly have been an acute on chronic exacerbation due to overuse from avoiding using the left hand and arm.  OT will address it in the next session if the referring provider agrees that this is appropriate.   Exercises - Wrist Flexion Stretch  - 4 x daily -  3-5 reps - 15 sec hold - Wrist Prayer Stretch  - 4 x daily - 3-5 reps - 15 sec hold - Stretch thumb toward base of small finger (put hand in LAP)  - 2-3 x daily - 3-5 reps - 15 sec hold - Thumb PROM Composite Extension  - 2-3 x daily - 3 reps - 15 sec hold - Thumb Opposition  - 4-6 x daily - 10 reps - Finger Pinch and Pull with Putty  - 2-3 x daily - 5 reps - Full Fist  - 2-3 x daily - 5 reps - Thumb Opposition with Putty  - 2-3 x daily - 5 reps   -Ulnar nerve gliding   PATIENT EDUCATION: Education details: See tx section above for details  Person educated: Patient Education method: Verbal Instruction, Teach back, Handouts  Education comprehension: States and demonstrates understanding, Additional Education required    HOME EXERCISE PROGRAM: Access Code: [email or web address] URL: [email or web address] Date: 08/13/2023 Prepared by: Leartis Proud   GOALS: Goals reviewed with patient? Yes   SHORT TERM GOALS: (STG required if POC>30 days) Target Date: 08/28/2023  Pt will obtain protective, custom orthotic. Goal status: 08/13/2023: MET   2.  Pt will demo/state understanding of initial HEP to improve pain levels and prerequisite motion. Goal status: 08/24/23: MET   LONG TERM GOALS: Target Date: 09/25/2023  Pt will improve functional ability by decreased impairment per PSFS assessment from 5.3 to 7.3 or better, for better quality of life. Goal status:09/28/23: Goal met  2.  Pt will improve grip strength in  left nondominant hand hand from unsafe to test lbs to at least 25 lbs for functional use at home and in IADLs. Goal status: 09/28/23: Now 22 pounds partially met  3.  Pt will improve A/ROM in left wrist flexion/extension from 45/49 respectively to at least 60 degrees each, to have functional motion for tasks like reach and grasp.  Goal status: 09/28/23: Improving to 49/68 respectively  4.  Pt will improve strength in left wrist flexion/extension from apparent 3 -/5 MMT to at  least 4+/5 MMT to have increased functional ability to carry out selfcare and higher-level homecare tasks with less difficulty. Goal status: 09/28/23: Partially met, 4/5 MMT when tested today in both planes of motion  5.  Pt will improve coordination skills in left hand, as seen by within functional limit score on nine-hole peg testing to have increased functional ability to carry out fine motor tasks (fasteners, etc.) and more complex, coordinated IADLs (meal prep, sports, etc.).  Goal status: 09/28/23: Goal met  6.  Pt will decrease pain at worst from 3/10 to 1/10 or better to have better sleep and occupational participation in daily roles. Goal status: 09/28/23: Not met-still complains of relatively high resting pain, though this may be a perception issue, as she has no significant pain posturing or outward pain signs during sessions     ASSESSMENT:  CLINICAL IMPRESSION: 09/28/23: She is meant to out of 6 of her long-term goals and has made progress towards the remaining 4.  Unfortunately she has missed a couple of therapy sessions, and she could use additional sessions, so OT will request additional authorization.  We will also be addressing some acute on chronic right sided shoulder stiffness and pain that may be a sequela of not using her hand and arm much on the left side.    PLAN:  OT FREQUENCY: 1-2 / week  OT DURATION: 6 weeks through 09/25/2023 - 11/06/23  and up to 14 total visits as needed  PLANNED INTERVENTIONS: 97168 OT Re-evaluation, 97535 self care/ADL training, 40981 therapeutic exercise, 97530 therapeutic activity, 97112 neuromuscular re-education, 97140 manual therapy, 97035 ultrasound, 97039 fluidotherapy, 97010 moist heat, 97010 cryotherapy, 97760 Orthotic Initial, 97763 Orthotic/Prosthetic subsequent, scar mobilization, compression bandaging, coping strategies training, and patient/family education   CONSULTED AND AGREED WITH PLAN OF CARE: Patient  PLAN FOR NEXT SESSION:    Check new therapy putty activities, assign right shoulder mobilization program for HEP, do manual therapy as helpful  Leartis Proud, OTR/L,  CHT 09/28/2023, 4:41 PM   Date of referral: 08/05/23 Referring provider:  Sandie Cross, PA-C    Referring diagnosis? M18.12 (ICD-10-CM) - Arthritis of carpometacarpal (CMC) joint of left thumb  Treatment diagnosis? (if different than referring diagnosis) M79.642, M62.81   What was this (referring dx) caused by? Surgery (Type: Lt thumb CMC joint arthroplasty)   Nature of Condition: Initial Onset (within last 3 months)              Laterality: Lt hand and thumb, right shoulder  Current Functional Measure Score: Patient Specific Functional Scale 8.3  Objective measurements identify impairments when they are compared to normal values, the uninvolved extremity, and prior level of function.  [x]  Yes  []  No  Objective assessment of functional ability: Minimal functional limitations   Briefly describe symptoms: Stiffness, soreness, weakness and decreased function in the left hand and thumb.  She has also had an acute on chronic exacerbation of right shoulder pain and stiffness from overuse.   How did symptoms  start: Initially was painful and arthritic, finally having the surgery on 23 July 2023   Average pain intensity:             Last 24 hours: 5-6/10             Past week: 4-5/10   How often does the pt experience symptoms? Occasionally  How much have the symptoms interfered with usual daily activities? A little bit  How has condition changed since care began at this facility? Better (functional score improved from 5.3 now to 8.3.)  In general, how is the patients overall health? Very Good   BACK PAIN (STarT Back Screening Tool) No

## 2023-09-28 ENCOUNTER — Encounter: Payer: Self-pay | Admitting: Rehabilitative and Restorative Service Providers"

## 2023-09-28 ENCOUNTER — Ambulatory Visit (INDEPENDENT_AMBULATORY_CARE_PROVIDER_SITE_OTHER): Admitting: Rehabilitative and Restorative Service Providers"

## 2023-09-28 DIAGNOSIS — M79642 Pain in left hand: Secondary | ICD-10-CM | POA: Diagnosis not present

## 2023-09-28 DIAGNOSIS — M25511 Pain in right shoulder: Secondary | ICD-10-CM

## 2023-09-28 DIAGNOSIS — R600 Localized edema: Secondary | ICD-10-CM

## 2023-09-28 DIAGNOSIS — M6281 Muscle weakness (generalized): Secondary | ICD-10-CM | POA: Diagnosis not present

## 2023-09-28 DIAGNOSIS — M25611 Stiffness of right shoulder, not elsewhere classified: Secondary | ICD-10-CM

## 2023-09-28 DIAGNOSIS — G8929 Other chronic pain: Secondary | ICD-10-CM

## 2023-09-29 ENCOUNTER — Ambulatory Visit (INDEPENDENT_AMBULATORY_CARE_PROVIDER_SITE_OTHER): Admitting: Orthopaedic Surgery

## 2023-09-29 DIAGNOSIS — M1812 Unilateral primary osteoarthritis of first carpometacarpal joint, left hand: Secondary | ICD-10-CM | POA: Insufficient documentation

## 2023-09-29 DIAGNOSIS — G8929 Other chronic pain: Secondary | ICD-10-CM | POA: Insufficient documentation

## 2023-09-29 DIAGNOSIS — M25511 Pain in right shoulder: Secondary | ICD-10-CM

## 2023-09-29 NOTE — Progress Notes (Signed)
 Office Visit Note   Patient: [name]           Date of Birth: [birth year]           MRN: [id number] Visit Date: 09/29/2023              Requested by: Jonathon Neighbors, MD [address], [address] [place],  [place] [address] PCP: Jonathon Neighbors, MD   Assessment & Plan: Visit Diagnoses:  1. Arthritis of carpometacarpal (CMC) joint of left thumb   2. Chronic right shoulder pain     Plan: History of Present Illness [name] is a 73 year old female who presents with left wrist discomfort and right shoulder tightness.  She experiences ongoing wrist discomfort, particularly at night, with tingling sensations that sometimes wake her. The sensation is described as aching, without numbness or tingling in her fingers.  Her shoulder tightness has seen some relief from a subacromial injection in February. Pain occurs during flexion and abduction. She attributes some tightness to her sleeping position. She has scheduled new therapy sessions to address both hand and shoulder issues.  Physical Exam MUSCULOSKELETAL: Pain on wrist movement.  Surgical scar is fully healed.  Excellent range of motion of the thumb.  Right shoulder flexion to 100 degrees with pain at 90 degrees flexion. Right shoulder abduction to 90 degrees. Pain with impingement signs in right shoulder. Rotator cuff strength intact in right shoulder.  Assessment and Plan Wrist discomfort status post [facility] arthroplasty Post-surgical wrist discomfort with aching and nocturnal tingling, likely due to normal symptoms of recovery. - Continue current therapy regimen.  Shoulder pain with impingement signs Shoulder pain with impingement and tightness, possible rotator cuff inflammation. Previous subacromial injection provided some relief. - Refer to physical therapy for shoulder rehabilitation. - Schedule follow-up in six weeks.  Follow-Up Instructions: Return in about 6 weeks (around 11/10/2023).   Orders:  Orders Placed This Encounter   Procedures   Ambulatory referral to Physical Therapy   No orders of the defined types were placed in this encounter.     Procedures: No procedures performed   Clinical Data: No additional findings.   Subjective: Chief Complaint  Patient presents with   Left Hand - Pain   Neck - Pain   Right Shoulder - Pain     PMFS History: Patient Active Problem List   Diagnosis Date Noted   Arthritis of carpometacarpal [facility]) joint of left thumb 09/29/2023   Chronic right shoulder pain 09/29/2023   Breast asymmetry following reconstructive surgery 05/22/2023   Torn earlobe, left, initial encounter 12/17/2018   S/P breast reconstruction, bilateral 03/16/2018   History of breast cancer in female 01/27/2018   History of reconstruction of both breasts 01/27/2018   Acquired absence of right breast 09/23/2017   Breast cancer (HCC) 06/22/2017   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   Genetic testing 04/17/2017   DJD (degenerative joint disease) of knee 03/30/2013   Witnessed episode of apnea 03/23/2013   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   Weakness 11/28/2011   Hypertension 11/28/2011   Hypokalemia 11/28/2011   LUE weakness 11/28/2011   Hypothyroidism 11/28/2011   Thyroid  cancer (HCC) 04/28/2010   Past Medical History:  Diagnosis Date   Anemia    "years ago"   Arthritis    "left hand; was in my knees" (09/24/2017)   Breast cancer, right breast (HCC) 2004   Bruises easily    Chronic lower back pain    Ductal  carcinoma in situ (DCIS) of left breast 2018   Genetic testing 04/17/2017   STAT Breast panel with reflex to Multi-Cancer panel (83 genes) @ [facility] - No pathogenic mutations detected   History of bronchitis    when she was young   Hypertension    no longer taking medications as of August 2019   Hypothyroidism    takes Synthroid  daily   Joint pain    Joint swelling    Personal history of chemotherapy 04/2003   Personal history of radiation therapy 2005    Pneumonia    hx of when she was young   Right elbow tendonitis    Sleep apnea    has a machine but does not use (09/24/2017)   Thyroid  cancer (HCC) 2012   Urinary frequency     Family History  Problem Relation Age of Onset   Breast cancer Mother 75       currently 11   Liver cancer Father    Breast cancer Other        pat grandfather's sister; dx 35s    Past Surgical History:  Procedure Laterality Date   ANTERIOR CERVICAL DECOMP/DISCECTOMY FUSION  2008   APPENDECTOMY  2007   BREAST BIOPSY Right 02/28/2003   malignant   BREAST BIOPSY Left 03/2017   BREAST LUMPECTOMY Right 2004   BREAST RECONSTRUCTION WITH PLACEMENT OF TISSUE EXPANDER AND FLEX HD (ACELLULAR HYDRATED DERMIS) Bilateral 06/22/2017   Procedure: IMMEDIATE BILATERAL BREAST RECONSTRUCTION WITH PLACEMENT OF TISSUE EXPANDER AND FLEX HD (ACELLULAR HYDRATED DERMIS);  Surgeon: Thornell Flirt, DO;  Location: [facility];  Service: Plastics;  Laterality: Bilateral;   CESAREAN SECTION  1981   COLONOSCOPY  X 2   INSERTION OF MESH N/A 02/10/2020   Procedure: INSERTION OF MESH;  Surgeon: Caralyn Chandler, MD;  Location[facility] OR;  Service: General;  Laterality: N/A;   KNEE ARTHROSCOPY Right 2007   KNEE ARTHROSCOPY  05/02/2011   Procedure: ARTHROSCOPY KNEE;  Surgeon: Loel Ring;  Location: [facility];  Service: Orthopedics;  Laterality: Right;   LATISSIMUS FLAP TO BREAST Right 09/23/2017   Procedure: RIGHT BREAST LATISSIMUS DORSI FLAP RECONSTRUCTION WITH TISSUE EXPANDER;  Surgeon: Thornell Flirt, DO;  Location[facility] OR;  Service: Plastics;  Laterality: Right;   LIPOSUCTION WITH LIPOFILLING Bilateral 02/24/2019   Procedure: Bilateral lipofilling of breasts and liposuction of lateral breasts for symmetry;  Surgeon: Thornell Flirt, DO;  Location: [facility];  Service: Plastics;  Laterality: Bilateral;  2 hours, please   MASTECTOMY W/ SENTINEL NODE BIOPSY Bilateral 06/22/2017    Procedure: LEFT MASTECTOMY WITH SENTINEL LYMPH NODE BIOPSY AND RIGHT PROPHYLACTIC MASTECTOMY;  Surgeon: Caralyn Chandler, MD;  Location: [facility];  Service: General;  Laterality: Bilateral;   MENISCUS DEBRIDEMENT  05/02/2011   Procedure: DEBRIDEMENT OF MENISCUS;  Surgeon: Loel Ring;  Location: [facility];  Service: Orthopedics;  Laterality: Right;   PORTA CATH REMOVAL  2005   PORTACATH PLACEMENT  2004   REMOVAL OF BILATERAL TISSUE EXPANDERS WITH PLACEMENT OF BILATERAL BREAST IMPLANTS Bilateral 05/26/2018   Procedure: REMOVAL OF BILATERAL TISSUE EXPANDERS WITH PLACEMENT OF BILATERAL BREAST IMPLANTS;  Surgeon: Thornell Flirt, DO;  Location: [facility];  Service: Plastics;  Laterality: Bilateral;   REMOVAL OF TISSUE EXPANDER AND PLACEMENT OF IMPLANT Right 12/12/2017   Procedure: REMOVAL OF TISSUE EXPANDER, RIGHT;  Surgeon: Alger Infield, MD;  Location: [facility];  Service: Plastics;  Laterality: Right;   THYROIDECTOMY N/A 09/22/2012   Procedure: RIGHT THYROID  LOBECTOMY WITH FROZEN SECTION;  Surgeon: Janita Mellow, MD;  Location: [facility] OR;  Service: ENT;  Laterality: N/A;   THYROIDECTOMY Left 10/21/2012   Procedure: COMPLETION OF THYROIDECTOMY;  Surgeon: Janita Mellow, MD;  Location: [facility] OR;  Service: ENT;  Laterality: Left;   TISSUE EXPANDER PLACEMENT Right 03/03/2018   Procedure: TISSUE EXPANDER PLACEMENT;  Surgeon: Thornell Flirt, DO;  Location: [facility];  Service: Plastics;  Laterality: Right;   TOTAL KNEE ARTHROPLASTY  04/14/2012   Procedure: TOTAL KNEE ARTHROPLASTY;  Surgeon: Ferd Householder, MD;  Location: [facility] OR;  Service: Orthopedics;  Laterality: Right;  RIGHT ARTHROPLASTY KNEE MEDIAL/LATERAL COMPARTMENTS WITH PATELLA RESURFACING   TOTAL KNEE ARTHROPLASTY Left 03/30/2013   Procedure: TOTAL KNEE ARTHROPLASTY;  Surgeon: Ferd Householder, MD;  Location: [facility] OR;  Service: Orthopedics;  Laterality: Left;   VENTRAL HERNIA REPAIR N/A  02/10/2020   Procedure: LAPAROSCOPIC ASSISTED VENTRAL HERNIA REPAIR;  Surgeon: Caralyn Chandler, MD;  Location: [facility];  Service: General;  Laterality: N/A;   Social History   Occupational History   Not on file  Tobacco Use   Smoking status: Never   Smokeless tobacco: Never  Vaping Use   Vaping status: Never Used  Substance and Sexual Activity   Alcohol use: Yes    Comment: occasional   Drug use: Not Currently   Sexual activity: Not Currently    Birth control/protection: Post-menopausal

## 2023-10-06 NOTE — Therapy (Signed)
 OUTPATIENT OCCUPATIONAL THERAPY TREATMENT NOTE   Patient Name: [name] MRN: [id number] DOB:1951/03/18, 73 y.o., female Today's Date: 10/07/2023  PCP: Bascom Bossier MD REFERRING PROVIDER: Sandie Cross, PA-C    END OF SESSION:  OT End of Session - 10/07/23 1608     Visit Number 7    Number of Visits 14    Date for OT Re-Evaluation 11/06/23    Authorization Type [employer]    OT Start Time 1607    OT Stop Time 1645    OT Time Calculation (min) 38 min    Activity Tolerance Patient tolerated treatment well;No increased pain;Patient limited by fatigue;Patient limited by pain    Behavior During Therapy [facility] for tasks assessed/performed               Past Medical History:  Diagnosis Date   Anemia    years ago   Arthritis    left hand; was in my knees (09/24/2017)   Breast cancer, right breast (HCC) 2004   Bruises easily    Chronic lower back pain    Ductal carcinoma in situ (DCIS) of left breast 2018   Genetic testing 04/17/2017   STAT Breast panel with reflex to Multi-Cancer panel (83 genes) @ [facility] - No pathogenic mutations detected   History of bronchitis    when she was young   Hypertension    no longer taking medications as of August 2019   Hypothyroidism    takes Synthroid  daily   Joint pain    Joint swelling    Personal history of chemotherapy 04/2003   Personal history of radiation therapy 2005   Pneumonia    hx of when she was young   Right elbow tendonitis    Sleep apnea    has a machine but does not use (09/24/2017)   Thyroid  cancer (HCC) 2012   Urinary frequency    Past Surgical History:  Procedure Laterality Date   ANTERIOR CERVICAL DECOMP/DISCECTOMY FUSION  2008   APPENDECTOMY  2007   BREAST BIOPSY Right 02/28/2003   malignant   BREAST BIOPSY Left 03/2017   BREAST LUMPECTOMY Right 2004   BREAST RECONSTRUCTION WITH PLACEMENT OF TISSUE EXPANDER AND FLEX HD (ACELLULAR HYDRATED DERMIS) Bilateral 06/22/2017   Procedure: IMMEDIATE BILATERAL  BREAST RECONSTRUCTION WITH PLACEMENT OF TISSUE EXPANDER AND FLEX HD (ACELLULAR HYDRATED DERMIS);  Surgeon: Thornell Flirt, DO;  Location: [facility];  Service: Plastics;  Laterality: Bilateral;   CESAREAN SECTION  1981   COLONOSCOPY  X 2   INSERTION OF MESH N/A 02/10/2020   Procedure: INSERTION OF MESH;  Surgeon: Caralyn Chandler, MD;  Location: [facility] OR;  Service: General;  Laterality: N/A;   KNEE ARTHROSCOPY Right 2007   KNEE ARTHROSCOPY  05/02/2011   Procedure: ARTHROSCOPY KNEE;  Surgeon: Loel Ring;  Location: [facility];  Service: Orthopedics;  Laterality: Right;   LATISSIMUS FLAP TO BREAST Right 09/23/2017   Procedure: RIGHT BREAST LATISSIMUS DORSI FLAP RECONSTRUCTION WITH TISSUE EXPANDER;  Surgeon: Thornell Flirt, DO;  Location[facility];  Service: Plastics;  Laterality: Right;   LIPOSUCTION WITH LIPOFILLING Bilateral 02/24/2019   Procedure: Bilateral lipofilling of breasts and liposuction of lateral breasts for symmetry;  Surgeon: Thornell Flirt, DO;  Location: [facility];  Service: Plastics;  Laterality: Bilateral;  2 hours, please   MASTECTOMY W/ SENTINEL NODE BIOPSY Bilateral 06/22/2017   Procedure: LEFT MASTECTOMY WITH SENTINEL LYMPH NODE BIOPSY AND RIGHT PROPHYLACTIC MASTECTOMY;  Surgeon:  Caralyn Chandler, MD;  Location: [facility];  Service: General;  Laterality: Bilateral;   MENISCUS DEBRIDEMENT  05/02/2011   Procedure: DEBRIDEMENT OF MENISCUS;  Surgeon: Loel Ring;  Location: [facility];  Service: Orthopedics;  Laterality: Right;   PORTA CATH REMOVAL  2005   PORTACATH PLACEMENT  2004   REMOVAL OF BILATERAL TISSUE EXPANDERS WITH PLACEMENT OF BILATERAL BREAST IMPLANTS Bilateral 05/26/2018   Procedure: REMOVAL OF BILATERAL TISSUE EXPANDERS WITH PLACEMENT OF BILATERAL BREAST IMPLANTS;  Surgeon: Thornell Flirt, DO;  Location: [facility];  Service: Plastics;  Laterality:  Bilateral;   REMOVAL OF TISSUE EXPANDER AND PLACEMENT OF IMPLANT Right 12/12/2017   Procedure: REMOVAL OF TISSUE EXPANDER, RIGHT;  Surgeon: Alger Infield, MD;  Location[facility];  Service: Plastics;  Laterality: Right;   THYROIDECTOMY N/A 09/22/2012   Procedure: RIGHT THYROID  LOBECTOMY WITH FROZEN SECTION;  Surgeon: Janita Mellow, MD;  Location: [facility] OR;  Service: ENT;  Laterality: N/A;   THYROIDECTOMY Left 10/21/2012   Procedure: COMPLETION OF THYROIDECTOMY;  Surgeon: Janita Mellow, MD;  Location: [facility] OR;  Service: ENT;  Laterality: Left;   TISSUE EXPANDER PLACEMENT Right 03/03/2018   Procedure: TISSUE EXPANDER PLACEMENT;  Surgeon: Thornell Flirt, DO;  Location: [facility];  Service: Plastics;  Laterality: Right;   TOTAL KNEE ARTHROPLASTY  04/14/2012   Procedure: TOTAL KNEE ARTHROPLASTY;  Surgeon: Ferd Householder, MD;  Location: [facility] OR;  Service: Orthopedics;  Laterality: Right;  RIGHT ARTHROPLASTY KNEE MEDIAL/LATERAL COMPARTMENTS WITH PATELLA RESURFACING   TOTAL KNEE ARTHROPLASTY Left 03/30/2013   Procedure: TOTAL KNEE ARTHROPLASTY;  Surgeon: Ferd Householder, MD;  Location: [facility] OR;  Service: Orthopedics;  Laterality: Left;   VENTRAL HERNIA REPAIR N/A 02/10/2020   Procedure: LAPAROSCOPIC ASSISTED VENTRAL HERNIA REPAIR;  Surgeon: Caralyn Chandler, MD;  Location: [facility] OR;  Service: General;  Laterality: N/A;   Patient Active Problem List   Diagnosis Date Noted   Arthritis of carpometacarpal [facility]) joint of left thumb 09/29/2023   Chronic right shoulder pain 09/29/2023   Breast asymmetry following reconstructive surgery 05/22/2023   Torn earlobe, left, initial encounter 12/17/2018   S/P breast reconstruction, bilateral 03/16/2018   History of breast cancer in female 01/27/2018   History of reconstruction of both breasts 01/27/2018   Acquired absence of right breast 09/23/2017   Breast cancer (HCC) 06/22/2017   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   Genetic testing  04/17/2017   DJD (degenerative joint disease) of knee 03/30/2013   Witnessed episode of apnea 03/23/2013   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   Weakness 11/28/2011   Hypertension 11/28/2011   Hypokalemia 11/28/2011   LUE weakness 11/28/2011   Hypothyroidism 11/28/2011   Thyroid  cancer (HCC) 04/28/2010    ONSET DATE: DOS 07/23/23  REFERRING DIAG: M18.12 (ICD-10-CM) - Arthritis of carpometacarpal (CMC) joint of left thumb   THERAPY DIAG:  Localized edema  Muscle weakness (generalized)  Chronic right shoulder pain  Stiffness of right shoulder, not elsewhere classified  Rationale for Evaluation and Treatment: Rehabilitation  PERTINENT HISTORY: Arthritis, breast cancer survivor, chronic low back pain, history of thyroid  issues and cancer as well. She states working from home for [employer], but will retire in May. She states no significant pain now.     PRECAUTIONS: None  RED FLAGS: None   WEIGHT BEARING RESTRICTIONS: Yes no weightbearing in the left hand and thumb now    SUBJECTIVE:   SUBJECTIVE STATEMENT: 9 weeks s/p  Lt thumb CMC J arthroplasty. She states not wearing orthosis at all for most of the time now, has been doing grip training and brings grip trainers in for the OT to recommend.  She also would like to start working on some shoulder stretches which were added to the plan of care last session.   PAIN:  Are you having pain?  No significant pain at rest today but her right shoulder is a bit aching in her thumb gets mildly tired at times.  PATIENT GOALS: Improve use of left hand and arm safely to return to normal activities  NEXT MD VISIT: As needed    OBJECTIVE: (All objective assessments below are from initial evaluation on: 08/13/23 unless otherwise specified.)   HAND DOMINANCE: Right   ADLs: Overall ADLs: States decreased ability to grab, hold household objects, pain and difficulty to open containers, perform FMS tasks (manipulate fasteners on clothing), mild to  moderate bathing problems as well.    FUNCTIONAL OUTCOME MEASURES: 09/28/23: PSFS: 8.3  Eval: Patient Specific Functional Scale: 5.3 (grab objects, dressed, open a jar)  (Higher Score  =  Better Ability for the Selected Tasks)       UPPER EXTREMITY ROM     Shoulder to Wrist AROM Left eval Lt 08/20/23 Lt 08/24/23 Lt 09/09/23 Lt 09/14/23 Lt 09/28/23  Shoulder Flexion      124   (146 in Rt)   Sh ER      60  Sh IR      23  Forearm supination 77       Forearm pronation  80       Wrist flexion 45 34 35 34 25 49  Wrist extension 49 53 55 54 54 68  (Blank rows = not tested)   Hand AROM Left eval Lt 08/20/23 Lt 08/24/23 Lt 09/09/23 Lt 09/14/23 Lt 09/28/23  Full Fist Ability (or Gap to Distal Palmar Crease) Full but loose      full  Thumb Opposition  (Kapandji Scale)   8/10 8/ 10 8/10  9/10  Thumb MCP (0-60) 0- 27 0 - 30 0 - 38 0 - 34 0 - 42 0 - 41  Thumb IP (0-80) +15 - 20 (+15) - 52 (+15) - 47 (+15) - 45 (+15) -51 (+15) - 54  Thumb Radial Abduction Span         Thumb Palmar Abduction Span         (Blank rows = not tested)    HAND FUNCTION: 10/07/23: grip: Lt 27.5#  09/28/23: Grip strength Right: 52 lbs, Left: 22 lbs  no significant pain   COORDINATION: 09/28/23: 9 Hole Peg Test Left: 27 sec (approximately 27 seconds is WFL)    SENSATION: Eval:  Light touch intact today, though diminished around sx area    EDEMA:   Eval:  Mildly swollen in left hand and wrist today  OBSERVATIONS:   Eval: Surgical area looking well-healing, no Steri-Strips on now, not overly hypersensitive but some mix of paresthesia present. Left thumb CMC arthroplasty   TODAY'S TREATMENT:  10/07/23: We reviewed her home exercise program including wrist stretches, thumb stretches and therapy putty squeezing activities.  OT also takes a look at her grip trainers that she purchased and recommends staying with therapy putty for another 1 to 2 weeks until she can tolerate the 10 pound grip trainer that she  purchased.  She was also recommended to continue to wean from her orthosis during the day and also carefully at night  now as tolerated.  She should try to include her hand in more activities especially ones that are less than 10 pounds now.   Next, to help with shoulder tight area in the lateral anterior aspect of the right shoulder, OT assigns her new stretch called sleeper stretch.  This targets that area and the tight external rotator.  This is demonstrated to her, she demonstrates back for understanding and after she stands up she moves her shoulder and states that it feels much better and that all of her pain is currently gone.  OT asks her to do these 2 or 3 times a day, 3 times each session for 15 seconds stretches.  She states understanding all directions today and leaves in no significant or increased pain.   Exercises - Wrist Flexion Stretch  - 4 x daily - 3-5 reps - 15 sec hold - Wrist Prayer Stretch  - 4 x daily - 3-5 reps - 15 sec hold - Stretch thumb toward base of small finger (put hand in LAP)  - 2-3 x daily - 3-5 reps - 15 sec hold - Thumb PROM Composite Extension  - 2-3 x daily - 3 reps - 15 sec hold - Thumb Opposition  - 4-6 x daily - 10 reps - Finger Pinch and Pull with Putty  - 2-3 x daily - 5 reps - Full Fist  - 2-3 x daily - 5 reps - Thumb Opposition with Putty  - 2-3 x daily - 5 reps - Sleeper Stretch  - 3-4 x daily - 3-5 reps - 15 hold   PATIENT EDUCATION: Education details: See tx section above for details  Person educated: Patient Education method: Verbal Instruction, Teach back, Handouts  Education comprehension: States and demonstrates understanding, Additional Education required    HOME EXERCISE PROGRAM: Access Code: [email or web address] URL: [email or web address] Date: 08/13/2023 Prepared by: Leartis Proud   GOALS: Goals reviewed with patient? Yes   SHORT TERM GOALS: (STG required if POC>30 days) Target Date: 08/28/2023  Pt will obtain  protective, custom orthotic. Goal status: 08/13/2023: MET   2.  Pt will demo/state understanding of initial HEP to improve pain levels and prerequisite motion. Goal status: 08/24/23: MET   LONG TERM GOALS: Target Date: 11/06/2023  Pt will improve functional ability by decreased impairment per PSFS assessment from 5.3 to 7.3 or better, for better quality of life. Goal status:09/28/23: Goal met  2.  Pt will improve grip strength in left nondominant hand hand from unsafe to test lbs to at least 25 lbs for functional use at home and in IADLs. Goal status: 09/28/23: Now 22 pounds partially met  3.  Pt will improve A/ROM in left wrist flexion/extension from 45/49 respectively to at least 60 degrees each, to have functional motion for tasks like reach and grasp.  Goal status: 09/28/23: Improving to 49/68 respectively  4.  Pt will improve strength in left wrist flexion/extension from apparent 3 -/5 MMT to at least 4+/5 MMT to have increased functional ability to carry out selfcare and higher-level homecare tasks with less difficulty. Goal status: 09/28/23: Partially met, 4/5 MMT when tested today in both planes of motion  5.  Pt will improve coordination skills in left hand, as seen by within functional limit score on nine-hole peg testing to have increased functional ability to carry out fine motor tasks (fasteners, etc.) and more complex, coordinated IADLs (meal prep, sports, etc.).  Goal status: 09/28/23: Goal met  6.  Pt will decrease pain at worst from  3/10 to 1/10 or better to have better sleep and occupational participation in daily roles. Goal status: 09/28/23: Not met-still complains of relatively high resting pain, though this may be a perception issue, as she has no significant pain posturing or outward pain signs during sessions     ASSESSMENT:  CLINICAL IMPRESSION: 10/07/23: She has turned a corner since the last appointment, has very little to no pain now, no more ulnar nerve paresthesia (or  it is very improved), and now is managing her shoulder symptoms well with only 1 stretch.  She may only need 1-2 more weeks/1-2 more visits in therapy to achieve all of her goals.  09/28/23: She is meant to out of 6 of her long-term goals and has made progress towards the remaining 4.  Unfortunately she has missed a couple of therapy sessions, and she could use additional sessions, so OT will request additional authorization.  We will also be addressing some acute on chronic right sided shoulder stiffness and pain that may be a sequela of not using her hand and arm much on the left side.    PLAN:  OT FREQUENCY: 1-2 / week  OT DURATION: 6 weeks through 09/25/2023 - 11/06/23  and up to 14 total visits as needed  PLANNED INTERVENTIONS: 97168 OT Re-evaluation, 97535 self care/ADL training, 16109 therapeutic exercise, 97530 therapeutic activity, 97112 neuromuscular re-education, 97140 manual therapy, 97035 ultrasound, 97039 fluidotherapy, 97010 moist heat, 97010 cryotherapy, 97760 Orthotic Initial, 97763 Orthotic/Prosthetic subsequent, scar mobilization, compression bandaging, coping strategies training, and patient/family education   CONSULTED AND AGREED WITH PLAN OF CARE: Patient  PLAN FOR NEXT SESSION:   Continue to check hand and thumb motion, grip strength and tolerance to functional activities, and new shoulder stretches.  Be wary of ulnar nerve irritation coming back  [employer], OTR/L,  CHT 10/07/2023, 4:59 PM

## 2023-10-07 ENCOUNTER — Encounter: Payer: Self-pay | Admitting: Rehabilitative and Restorative Service Providers"

## 2023-10-07 ENCOUNTER — Ambulatory Visit: Admitting: Rehabilitative and Restorative Service Providers"

## 2023-10-07 DIAGNOSIS — M25511 Pain in right shoulder: Secondary | ICD-10-CM | POA: Diagnosis not present

## 2023-10-07 DIAGNOSIS — M25611 Stiffness of right shoulder, not elsewhere classified: Secondary | ICD-10-CM

## 2023-10-07 DIAGNOSIS — G8929 Other chronic pain: Secondary | ICD-10-CM

## 2023-10-07 DIAGNOSIS — R600 Localized edema: Secondary | ICD-10-CM | POA: Diagnosis not present

## 2023-10-07 DIAGNOSIS — M6281 Muscle weakness (generalized): Secondary | ICD-10-CM

## 2023-10-15 NOTE — Therapy (Signed)
 OUTPATIENT OCCUPATIONAL THERAPY TREATMENT AND DISCHARGE NOTE   Patient Name: [name] MRN: [id number] DOB:[birth year], 73 y.o., female Today's Date: 10/16/2023  PCP: Benjamine ROCKFORD MD REFERRING PROVIDER: Jule Ronal CROME, PA-C                    OCCUPATIONAL THERAPY DISCHARGE SUMMARY  Visits from Start of Care: 8  Current functional level related to goals / functional outcomes: 10/16/23: She has now met all of her long-term goals with the exception of wrist flexion and extension which is a little tighter than it was last week.  OT cautions are to keep on stretching for at least 2 more weeks and manage the symptoms.  Other than that, she has no pain, no paresthesia, left shoulder feels great, she felt happy to discharge therapy today.  Education / Equipment: Pt has all needed materials and education. Pt understands how to continue on with self-management. See tx notes for more details.   Patient agrees to discharge due to max benefits received from outpatient occupational therapy / hand therapy at this time.   Melvenia Ada, OTR/L, CHT 10/16/2023             END OF SESSION:  OT End of Session - 10/16/23 1010     Visit Number 8    Number of Visits 14    Date for OT Re-Evaluation 11/06/23    Authorization Type [employer]    OT Start Time 1010    OT Stop Time 1042    OT Time Calculation (min) 32 min    Activity Tolerance Patient tolerated treatment well;No increased pain;Patient limited by fatigue;Patient limited by pain    Behavior During Therapy [facility] for tasks assessed/performed             Past Medical History:  Diagnosis Date   Anemia    years ago   Arthritis    left hand; was in my knees (09/24/2017)   Breast cancer, right breast (HCC) 2004   Bruises easily    Chronic lower back pain    Ductal carcinoma in situ (DCIS) of left breast 2018   Genetic testing 04/17/2017   STAT Breast panel with reflex to Multi-Cancer panel (83 genes) @  [facility] - No pathogenic mutations detected   History of bronchitis    when she was young   Hypertension    no longer taking medications as of August 2019   Hypothyroidism    takes Synthroid  daily   Joint pain    Joint swelling    Personal history of chemotherapy 04/2003   Personal history of radiation therapy 2005   Pneumonia    hx of when she was young   Right elbow tendonitis    Sleep apnea    has a machine but does not use (09/24/2017)   Thyroid  cancer (HCC) 2012   Urinary frequency    Past Surgical History:  Procedure Laterality Date   ANTERIOR CERVICAL DECOMP/DISCECTOMY FUSION  2008   APPENDECTOMY  2007   BREAST BIOPSY Right 02/28/2003   malignant   BREAST BIOPSY Left 03/2017   BREAST LUMPECTOMY Right 2004   BREAST RECONSTRUCTION WITH PLACEMENT OF TISSUE EXPANDER AND FLEX HD (ACELLULAR HYDRATED DERMIS) Bilateral 06/22/2017   Procedure: IMMEDIATE BILATERAL BREAST RECONSTRUCTION WITH PLACEMENT OF TISSUE EXPANDER AND FLEX HD (ACELLULAR HYDRATED DERMIS);  Surgeon: Lowery Estefana RAMAN, DO;  Location: [facility];  Service: Plastics;  Laterality: Bilateral;   CESAREAN SECTION  1981   COLONOSCOPY  X 2   INSERTION OF MESH N/A 02/10/2020   Procedure: INSERTION OF MESH;  Surgeon: Curvin Deward MOULD, MD;  Location: [facility];  Service: General;  Laterality: N/A;   KNEE ARTHROSCOPY Right 2007   KNEE ARTHROSCOPY  05/02/2011   Procedure: ARTHROSCOPY KNEE;  Surgeon: Reyes JAYSON Billing;  Location: [facility];  Service: Orthopedics;  Laterality: Right;   LATISSIMUS FLAP TO BREAST Right 09/23/2017   Procedure: RIGHT BREAST LATISSIMUS DORSI FLAP RECONSTRUCTION WITH TISSUE EXPANDER;  Surgeon: Lowery Estefana RAMAN, DO;  [facility];  Service: Plastics;  Laterality: Right;   LIPOSUCTION WITH LIPOFILLING Bilateral 02/24/2019   Procedure: Bilateral lipofilling of breasts and liposuction of lateral breasts for symmetry;  Surgeon: Lowery Estefana RAMAN, DO;  Location: [facility];  Service: Plastics;  Laterality: Bilateral;  2 hours, please   MASTECTOMY W/ SENTINEL NODE BIOPSY Bilateral 06/22/2017   Procedure: LEFT MASTECTOMY WITH SENTINEL LYMPH NODE BIOPSY AND RIGHT PROPHYLACTIC MASTECTOMY;  Surgeon: Curvin Deward MOULD, MD;  Location: [facility];  Service: General;  Laterality: Bilateral;   MENISCUS DEBRIDEMENT  05/02/2011   Procedure: DEBRIDEMENT OF MENISCUS;  Surgeon: Reyes JAYSON Billing;  Location: [facility];  Service: Orthopedics;  Laterality: Right;   PORTA CATH REMOVAL  2005   PORTACATH PLACEMENT  2004   REMOVAL OF BILATERAL TISSUE EXPANDERS WITH PLACEMENT OF BILATERAL BREAST IMPLANTS Bilateral 05/26/2018   Procedure: REMOVAL OF BILATERAL TISSUE EXPANDERS WITH PLACEMENT OF BILATERAL BREAST IMPLANTS;  Surgeon: Lowery Estefana RAMAN, DO;  Location: [facility];  Service: Plastics;  Laterality: Bilateral;   REMOVAL OF TISSUE EXPANDER AND PLACEMENT OF IMPLANT Right 12/12/2017   Procedure: REMOVAL OF TISSUE EXPANDER, RIGHT;  Surgeon: Arelia Filippo, MD;  [facility];  Service: Plastics;  Laterality: Right;   THYROIDECTOMY N/A 09/22/2012   Procedure: RIGHT THYROID  LOBECTOMY WITH FROZEN SECTION;  Surgeon: Ida Loader, MD;  Location: [facility] OR;  Service: ENT;  Laterality: N/A;   THYROIDECTOMY Left 10/21/2012   Procedure: COMPLETION OF THYROIDECTOMY;  Surgeon: Ida Loader, MD;  Location: [facility] OR;  Service: ENT;  Laterality: Left;   TISSUE EXPANDER PLACEMENT Right 03/03/2018   Procedure: TISSUE EXPANDER PLACEMENT;  Surgeon: Lowery Estefana RAMAN, DO;  Location: [facility];  Service: Plastics;  Laterality: Right;   TOTAL KNEE ARTHROPLASTY  04/14/2012   Procedure: TOTAL KNEE ARTHROPLASTY;  Surgeon: Toribio JULIANNA Chancy, MD;  Location: [facility] OR;  Service: Orthopedics;  Laterality: Right;  RIGHT ARTHROPLASTY KNEE MEDIAL/LATERAL COMPARTMENTS WITH PATELLA RESURFACING   TOTAL KNEE ARTHROPLASTY Left 03/30/2013   Procedure: TOTAL KNEE  ARTHROPLASTY;  Surgeon: Toribio JULIANNA Chancy, MD;  [facility];  Service: Orthopedics;  Laterality: Left;   VENTRAL HERNIA REPAIR N/A 02/10/2020   Procedure: LAPAROSCOPIC ASSISTED VENTRAL HERNIA REPAIR;  Surgeon: Curvin Deward MOULD, MD;  Location: [facility] OR;  Service: General;  Laterality: N/A;   Patient Active Problem List   Diagnosis Date Noted   Arthritis of carpometacarpal [facility]) joint of left thumb 09/29/2023   Chronic right shoulder pain 09/29/2023   Breast asymmetry following reconstructive surgery 05/22/2023   Torn earlobe, left, initial encounter 12/17/2018   S/P breast reconstruction, bilateral 03/16/2018   History of breast cancer in female 01/27/2018   History of reconstruction of both breasts 01/27/2018   Acquired absence of right breast 09/23/2017   Breast cancer (HCC) 06/22/2017   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   Genetic testing 04/17/2017   DJD (degenerative joint  disease) of knee 03/30/2013   Witnessed episode of apnea 03/23/2013   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   Weakness 11/28/2011   Hypertension 11/28/2011   Hypokalemia 11/28/2011   LUE weakness 11/28/2011   Hypothyroidism 11/28/2011   Thyroid  cancer (HCC) 04/28/2010    ONSET DATE: DOS 07/23/23  REFERRING DIAG: M18.12 (ICD-10-CM) - Arthritis of carpometacarpal (CMC) joint of left thumb   THERAPY DIAG:  Localized edema  Muscle weakness (generalized)  Stiffness of right shoulder, not elsewhere classified  Chronic right shoulder pain  Right shoulder pain, unspecified chronicity  Pain in left hand  Rationale for Evaluation and Treatment: Rehabilitation  PERTINENT HISTORY: Arthritis, breast cancer survivor, chronic low back pain, history of thyroid  issues and cancer as well. She states working from home for [employer], but will retire in May. She states no significant pain now.     PRECAUTIONS: None  RED FLAGS: None   WEIGHT BEARING RESTRICTIONS: Yes no weightbearing in the left hand and thumb  now    SUBJECTIVE:   SUBJECTIVE STATEMENT: 10 weeks s/p Lt thumb CMC J arthroplasty. She states hand and thumb not having pain or issues now, she states her shoulder is feeling much better after performing shoulder stretches and watching her sleep postures.  She feels like she may not need any more therapy    PAIN:  Are you having pain?  No significant pain at rest today but her right shoulder is a bit aching in her thumb gets mildly tired at times.  PATIENT GOALS: Improve use of left hand and arm safely to return to normal activities  NEXT MD VISIT: As needed    OBJECTIVE: (All objective assessments below are from initial evaluation on: 08/13/23 unless otherwise specified.)   HAND DOMINANCE: Right   ADLs: Overall ADLs: States decreased ability to grab, hold household objects, pain and difficulty to open containers, perform FMS tasks (manipulate fasteners on clothing), mild to moderate bathing problems as well.    FUNCTIONAL OUTCOME MEASURES: 09/28/23: PSFS: 8.3  Eval: Patient Specific Functional Scale: 5.3 (grab objects, dressed, open a jar)  (Higher Score  =  Better Ability for the Selected Tasks)       UPPER EXTREMITY ROM     Shoulder to Wrist AROM Left eval Lt 09/28/23 Lt 10/16/23  Shoulder Flexion  124   (146 in Rt)  131  Sh ER  60 57  Sh IR  23 37  Forearm supination 77    Forearm pronation  80    Wrist flexion 45 49 40  Wrist extension 49 68 58  (Blank rows = not tested)   Hand AROM Left eval Lt 08/20/23 Lt 08/24/23 Lt 09/09/23 Lt 09/14/23 Lt 09/28/23  Full Fist Ability (or Gap to Distal Palmar Crease) Full but loose      full  Thumb Opposition  (Kapandji Scale)   8/10 8/ 10 8/10  9/10  Thumb MCP (0-60) 0- 27 0 - 30 0 - 38 0 - 34 0 - 42 0 - 41  Thumb IP (0-80) +15 - 20 (+15) - 52 (+15) - 47 (+15) - 45 (+15) -51 (+15) - 54  Thumb Radial Abduction Span         Thumb Palmar Abduction Span         (Blank rows = not tested)    HAND FUNCTION: 10/16/23: Grip:  Lt: 28.6#   09/28/23: Grip strength Right: 52 lbs, Left: 22 lbs  no significant pain   COORDINATION: 09/28/23: 9 Hole Peg Test  Left: 27 sec (approximately 27 seconds is WFL)    SENSATION: Eval:  Light touch intact today, though diminished around sx area    EDEMA:   Eval:  Mildly swollen in left hand and wrist today  OBSERVATIONS:   Eval: Surgical area looking well-healing, no Steri-Strips on now, not overly hypersensitive but some mix of paresthesia present. Left thumb CMC arthroplasty   TODAY'S TREATMENT:  10/16/23: Pt performs AROM, gripping, and strength with Lt hand/arm and Rt shoulder against therapist's resistance for exercise/activities as well as new measures today. OT also discusses home and functional tasks with the pt and reviews goals.  She continues to state no significant functional problems, and we discussed her returning back to the gym and she was recommended to get a pair of weightlifting gloves.  Using the complied data, OT also reviews home exercises and provides updated recommendations and upgrades including adding shoulder rows with green therapy band to her plan of care once or twice a day most days of the week to help her postural ability.  We reviewed sleep postures which she states are much better and she is having less pain through her hands and her shoulders now.  Pt states understanding all directions and is happy to discharge therapy now.  She was told to use caution for 2 more weeks until 12 weeks postop, but to consider this last 2 weeks as training to return to all normal functional activities.  Exercises reviewed and performed today: - Wrist Flexion Stretch  - 4 x daily - 3-5 reps - 15 sec hold - Wrist Prayer Stretch  - 4 x daily - 3-5 reps - 15 sec hold - Stretch thumb toward base of small finger (put hand in LAP)  - 2-3 x daily - 3-5 reps - 15 sec hold - Thumb PROM Composite Extension  - 2-3 x daily - 3 reps - 15 sec hold - Finger Pinch and Pull with Putty   - 2-3 x daily - 5 reps - Full Fist  - 2-3 x daily - 5 reps - Thumb Opposition with Putty  - 2-3 x daily - 5 reps - Sleeper Stretch  - 3-4 x daily - 3-5 reps - 15 hold - Standing Shoulder Row with Anchored Resistance  - 2-4 x daily - 1-2 sets - 10-15 reps    PATIENT EDUCATION: Education details: See tx section above for details  Person educated: Patient Education method: Verbal Instruction, Teach back, Handouts  Education comprehension: States and demonstrates understanding   HOME EXERCISE PROGRAM: Access Code: [email or web address] URL: [email or web address] Date: 08/13/2023 Prepared by: Melvenia Ada   GOALS: Goals reviewed with patient? Yes   SHORT TERM GOALS: (STG required if POC>30 days) Target Date: 08/28/2023  Pt will obtain protective, custom orthotic. Goal status: 08/13/2023: MET   2.  Pt will demo/state understanding of initial HEP to improve pain levels and prerequisite motion. Goal status: 08/24/23: MET   LONG TERM GOALS: Target Date: 11/06/2023  Pt will improve functional ability by decreased impairment per PSFS assessment from 5.3 to 7.3 or better, for better quality of life. Goal status:09/28/23: Goal met  2.  Pt will improve grip strength in left nondominant hand hand from unsafe to test lbs to at least 25 lbs for functional use at home and in IADLs. Goal status: 10/16/2023: Met  3.  Pt will improve A/ROM in left wrist flexion/extension from 45/49 respectively to at least 60 degrees each, to have functional motion for tasks like reach and grasp.  Goal status: 10/16/2023: Not met, but we discussed her continuing on her stretches cautiously for the next 2 weeks.  4.  Pt will improve strength in left wrist flexion/extension from apparent 3 -/5 MMT to at least 4+/5 MMT to have increased functional ability to carry out selfcare and higher-level homecare tasks with less difficulty. Goal status: 10/16/2023: Goal met  5.  Pt will improve coordination skills in left  hand, as seen by within functional limit score on nine-hole peg testing to have increased functional ability to carry out fine motor tasks (fasteners, etc.) and more complex, coordinated IADLs (meal prep, sports, etc.).  Goal status: 09/28/23: Goal met  6.  Pt will decrease pain at worst from 3/10 to 1/10 or better to have better sleep and occupational participation in daily roles. Goal status: 10/16/2023: Goal met     ASSESSMENT:  CLINICAL IMPRESSION: 10/16/23: She has now met all of her long-term goals with the exception of wrist flexion and extension which is a little tighter than it was last week.  OT cautions are to keep on stretching for at least 2 more weeks and manage the symptoms.  Other than that, she has no pain, no paresthesia, left shoulder feels great, she felt happy to discharge therapy today.   PLAN:  OT FREQUENCY: D/C  OT DURATION: D/C  PLANNED INTERVENTIONS: 97168 OT Re-evaluation, 97535 self care/ADL training, 02889 therapeutic exercise, 97530 therapeutic activity, 97112 neuromuscular re-education, 97140 manual therapy, 97035 ultrasound, 97039 fluidotherapy, 97010 moist heat, 97010 cryotherapy, 97760 Orthotic Initial, 97763 Orthotic/Prosthetic subsequent, scar mobilization, compression bandaging, coping strategies training, and patient/family education   CONSULTED AND AGREED WITH PLAN OF CARE: Patient  PLAN FOR NEXT SESSION:   N/A, DC   Melvenia Ada, OTR/L,  CHT 10/16/2023, 10:49 AM

## 2023-10-16 ENCOUNTER — Ambulatory Visit: Admitting: Rehabilitative and Restorative Service Providers"

## 2023-10-16 ENCOUNTER — Ambulatory Visit: Admitting: Physical Therapy

## 2023-10-16 ENCOUNTER — Encounter: Payer: Self-pay | Admitting: Rehabilitative and Restorative Service Providers"

## 2023-10-16 DIAGNOSIS — M6281 Muscle weakness (generalized): Secondary | ICD-10-CM

## 2023-10-16 DIAGNOSIS — R600 Localized edema: Secondary | ICD-10-CM

## 2023-10-16 DIAGNOSIS — G8929 Other chronic pain: Secondary | ICD-10-CM | POA: Diagnosis not present

## 2023-10-16 DIAGNOSIS — M79642 Pain in left hand: Secondary | ICD-10-CM

## 2023-10-16 DIAGNOSIS — M25611 Stiffness of right shoulder, not elsewhere classified: Secondary | ICD-10-CM

## 2023-10-16 DIAGNOSIS — M25511 Pain in right shoulder: Secondary | ICD-10-CM

## 2023-10-23 ENCOUNTER — Encounter: Admitting: Rehabilitative and Restorative Service Providers"

## 2023-10-27 ENCOUNTER — Encounter: Admitting: Rehabilitative and Restorative Service Providers"

## 2023-11-05 ENCOUNTER — Other Ambulatory Visit: Payer: Self-pay | Admitting: "Endocrinology

## 2023-11-05 DIAGNOSIS — Z9889 Other specified postprocedural states: Secondary | ICD-10-CM

## 2023-11-06 ENCOUNTER — Ambulatory Visit
Admission: RE | Admit: 2023-11-06 | Discharge: 2023-11-06 | Disposition: A | Discharge status: Home / Self Care | Source: Ambulatory Visit | Attending: "Endocrinology | Admitting: "Endocrinology

## 2023-11-06 ENCOUNTER — Other Ambulatory Visit: Payer: Self-pay | Admitting: "Endocrinology

## 2023-11-06 DIAGNOSIS — Z9089 Acquired absence of other organs: Secondary | ICD-10-CM

## 2023-11-06 DIAGNOSIS — E041 Nontoxic single thyroid nodule: Secondary | ICD-10-CM | POA: Diagnosis not present

## 2023-11-09 DIAGNOSIS — E063 Autoimmune thyroiditis: Secondary | ICD-10-CM | POA: Diagnosis not present

## 2023-11-27 ENCOUNTER — Telehealth: Payer: Self-pay | Admitting: Plastic Surgery

## 2023-11-27 NOTE — Telephone Encounter (Signed)
 Patient is needing to cancel surgery, please reach out

## 2023-12-01 ENCOUNTER — Encounter: Admitting: Physician Assistant

## 2023-12-03 ENCOUNTER — Encounter: Admitting: Physician Assistant

## 2023-12-29 ENCOUNTER — Ambulatory Visit (INDEPENDENT_AMBULATORY_CARE_PROVIDER_SITE_OTHER): Admitting: Orthopaedic Surgery

## 2023-12-29 ENCOUNTER — Encounter: Payer: Self-pay | Admitting: Orthopaedic Surgery

## 2023-12-29 ENCOUNTER — Encounter: Admitting: Plastic Surgery

## 2023-12-29 DIAGNOSIS — M1812 Unilateral primary osteoarthritis of first carpometacarpal joint, left hand: Secondary | ICD-10-CM

## 2023-12-29 NOTE — Progress Notes (Signed)
 Post-Op Visit Note   Patient: [name]           Date of Birth: Sep 18, 1950           MRN: [id number] Visit Date: 12/29/2023 PCP: Benjamine Aland, MD   Assessment & Plan:  Chief Complaint:  Chief Complaint  Patient presents with   Left Hand - Follow-up    Left thumb [facility] arthroplasty 07/23/2023   Visit Diagnoses:  1. Arthritis of carpometacarpal (CMC) joint of left thumb     Plan: History of Present Illness [name] is a 73 year old female who presents for a six-month follow-up after left thumb CMC arthroplasty.  She experiences an intermittent sensation in her hand, particularly with movement. She performs daily activities but avoids heavy lifting, choosing weights based on comfort. She continues hand exercises to maintain movement and strength.  Physical Exam MUSCULOSKELETAL: Thumb opposition excellent. Range of motion great. SKIN: Scar well healed.  Assessment and Plan Status post left thumb CMC arthroplasty Six months post-surgery with intermittent, non-disruptive discomfort. Good function, excellent range of motion, and well-healed scar. - Continue hand exercises. - No further appointments unless issues arise.  Follow-Up Instructions: No follow-ups on file.   Orders:  No orders of the defined types were placed in this encounter.  No orders of the defined types were placed in this encounter.   Imaging: No results found.  PMFS History: Patient Active Problem List   Diagnosis Date Noted   Arthritis of carpometacarpal [facility]) joint of left thumb 09/29/2023   Chronic right shoulder pain 09/29/2023   Breast asymmetry following reconstructive surgery 05/22/2023   Torn earlobe, left, initial encounter 12/17/2018   S/P breast reconstruction, bilateral 03/16/2018   History of breast cancer in female 01/27/2018   History of reconstruction of both breasts 01/27/2018   Acquired absence of right breast 09/23/2017   Breast cancer (HCC) 06/22/2017   Ductal  carcinoma in situ (DCIS) of left breast 04/30/2017   Genetic testing 04/17/2017   DJD (degenerative joint disease) of knee 03/30/2013   Witnessed episode of apnea 03/23/2013   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   Weakness 11/28/2011   Hypertension 11/28/2011   Hypokalemia 11/28/2011   LUE weakness 11/28/2011   Hypothyroidism 11/28/2011   Thyroid  cancer (HCC) 04/28/2010   Past Medical History:  Diagnosis Date   Anemia    years ago   Arthritis    left hand; was in my knees (09/24/2017)   Breast cancer, right breast (HCC) 2004   Bruises easily    Chronic lower back pain    Ductal carcinoma in situ (DCIS) of left breast 2018   Genetic testing 04/17/2017   STAT Breast panel with reflex to Multi-Cancer panel (83 genes) @ [facility] - No pathogenic mutations detected   History of bronchitis    when she was young   Hypertension    no longer taking medications as of August 2019   Hypothyroidism    takes Synthroid  daily   Joint pain    Joint swelling    Personal history of chemotherapy 04/2003   Personal history of radiation therapy 2005   Pneumonia    hx of when she was young   Right elbow tendonitis    Sleep apnea    has a machine but does not use (09/24/2017)   Thyroid  cancer (HCC) 2012   Urinary frequency     Family History  Problem Relation Age of Onset   Breast cancer Mother 83  currently 77   Liver cancer Father    Breast cancer Other        pat grandfather's sister; dx 61s    Past Surgical History:  Procedure Laterality Date   ANTERIOR CERVICAL DECOMP/DISCECTOMY FUSION  2008   APPENDECTOMY  2007   BREAST BIOPSY Right 02/28/2003   malignant   BREAST BIOPSY Left 03/2017   BREAST LUMPECTOMY Right 2004   BREAST RECONSTRUCTION WITH PLACEMENT OF TISSUE EXPANDER AND FLEX HD (ACELLULAR HYDRATED DERMIS) Bilateral 06/22/2017   Procedure: IMMEDIATE BILATERAL BREAST RECONSTRUCTION WITH PLACEMENT OF TISSUE EXPANDER AND FLEX HD (ACELLULAR HYDRATED DERMIS);  Surgeon: Lowery Estefana RAMAN, DO;  Location: [facility];  Service: Plastics;  Laterality: Bilateral;   CESAREAN SECTION  1981   COLONOSCOPY  X 2   INSERTION OF MESH N/A 02/10/2020   Procedure: INSERTION OF MESH;  Surgeon: Curvin Deward MOULD, MD;  Location: [facility] OR;  Service: General;  Laterality: N/A;   KNEE ARTHROSCOPY Right 2007   KNEE ARTHROSCOPY  05/02/2011   Procedure: ARTHROSCOPY KNEE;  Surgeon: Reyes JAYSON Billing;  Location: [facility];  Service: Orthopedics;  Laterality: Right;   LATISSIMUS FLAP TO BREAST Right 09/23/2017   Procedure: RIGHT BREAST LATISSIMUS DORSI FLAP RECONSTRUCTION WITH TISSUE EXPANDER;  Surgeon: Lowery Estefana RAMAN, DO;  Location: [facility] OR;  Service: Plastics;  Laterality: Right;   LIPOSUCTION WITH LIPOFILLING Bilateral 02/24/2019   Procedure: Bilateral lipofilling of breasts and liposuction of lateral breasts for symmetry;  Surgeon: Lowery Estefana RAMAN, DO;  Location: [facility];  Service: Plastics;  Laterality: Bilateral;  2 hours, please   MASTECTOMY W/ SENTINEL NODE BIOPSY Bilateral 06/22/2017   Procedure: LEFT MASTECTOMY WITH SENTINEL LYMPH NODE BIOPSY AND RIGHT PROPHYLACTIC MASTECTOMY;  Surgeon: Curvin Deward MOULD, MD;  Location: [facility];  Service: General;  Laterality: Bilateral;   MENISCUS DEBRIDEMENT  05/02/2011   Procedure: DEBRIDEMENT OF MENISCUS;  Surgeon: Reyes JAYSON Billing;  Location: [facility];  Service: Orthopedics;  Laterality: Right;   PORTA CATH REMOVAL  2005   PORTACATH PLACEMENT  2004   REMOVAL OF BILATERAL TISSUE EXPANDERS WITH PLACEMENT OF BILATERAL BREAST IMPLANTS Bilateral 05/26/2018   Procedure: REMOVAL OF BILATERAL TISSUE EXPANDERS WITH PLACEMENT OF BILATERAL BREAST IMPLANTS;  Surgeon: Lowery Estefana RAMAN, DO;  Location: [facility];  Service: Plastics;  Laterality: Bilateral;   REMOVAL OF TISSUE EXPANDER AND PLACEMENT OF IMPLANT Right 12/12/2017   Procedure: REMOVAL OF TISSUE EXPANDER,  RIGHT;  Surgeon: Arelia Filippo, MD;  Location: [facility];  Service: Plastics;  Laterality: Right;   THYROIDECTOMY N/A 09/22/2012   Procedure: RIGHT THYROID  LOBECTOMY WITH FROZEN SECTION;  Surgeon: Ida Loader, MD;  Location: [facility] OR;  Service: ENT;  Laterality: N/A;   THYROIDECTOMY Left 10/21/2012   Procedure: COMPLETION OF THYROIDECTOMY;  Surgeon: Ida Loader, MD;  Location: [facility] OR;  Service: ENT;  Laterality: Left;   TISSUE EXPANDER PLACEMENT Right 03/03/2018   Procedure: TISSUE EXPANDER PLACEMENT;  Surgeon: Lowery Estefana RAMAN, DO;  Location: [facility];  Service: Plastics;  Laterality: Right;   TOTAL KNEE ARTHROPLASTY  04/14/2012   Procedure: TOTAL KNEE ARTHROPLASTY;  Surgeon: Toribio JULIANNA Chancy, MD;  Location: [facility] OR;  Service: Orthopedics;  Laterality: Right;  RIGHT ARTHROPLASTY KNEE MEDIAL/LATERAL COMPARTMENTS WITH PATELLA RESURFACING   TOTAL KNEE ARTHROPLASTY Left 03/30/2013   Procedure: TOTAL KNEE ARTHROPLASTY;  Surgeon: Toribio JULIANNA Chancy, MD;  Location[facility] OR;  Service: Orthopedics;  Laterality: Left;   VENTRAL HERNIA  REPAIR N/A 02/10/2020   Procedure: LAPAROSCOPIC ASSISTED VENTRAL HERNIA REPAIR;  Surgeon: Curvin Deward MOULD, MD;  Location[facility];  Service: General;  Laterality: N/A;   Social History   Occupational History   Not on file  Tobacco Use   Smoking status: Never   Smokeless tobacco: Never  Vaping Use   Vaping status: Never Used  Substance and Sexual Activity   Alcohol use: Yes    Comment: occasional   Drug use: Not Currently   Sexual activity: Not Currently    Birth control/protection: Post-menopausal

## 2024-01-06 DIAGNOSIS — Z780 Asymptomatic menopausal state: Secondary | ICD-10-CM | POA: Diagnosis not present

## 2024-01-06 DIAGNOSIS — E890 Postprocedural hypothyroidism: Secondary | ICD-10-CM | POA: Diagnosis not present

## 2024-01-06 DIAGNOSIS — E876 Hypokalemia: Secondary | ICD-10-CM | POA: Diagnosis not present

## 2024-01-06 DIAGNOSIS — C73 Malignant neoplasm of thyroid gland: Secondary | ICD-10-CM | POA: Diagnosis not present

## 2024-01-06 DIAGNOSIS — I10 Essential (primary) hypertension: Secondary | ICD-10-CM | POA: Diagnosis not present

## 2024-01-07 DIAGNOSIS — R0683 Snoring: Secondary | ICD-10-CM | POA: Diagnosis not present

## 2024-01-07 DIAGNOSIS — E039 Hypothyroidism, unspecified: Secondary | ICD-10-CM | POA: Diagnosis not present

## 2024-01-07 DIAGNOSIS — E7800 Pure hypercholesterolemia, unspecified: Secondary | ICD-10-CM | POA: Diagnosis not present

## 2024-01-07 DIAGNOSIS — I10 Essential (primary) hypertension: Secondary | ICD-10-CM | POA: Diagnosis not present

## 2024-01-07 DIAGNOSIS — R7309 Other abnormal glucose: Secondary | ICD-10-CM | POA: Diagnosis not present

## 2024-01-12 ENCOUNTER — Encounter: Admitting: Physician Assistant

## 2024-01-13 LAB — LAB REPORT - SCANNED
A1c: 5.5
EGFR: 58

## 2024-01-20 ENCOUNTER — Encounter: Admitting: Student

## 2024-01-25 DIAGNOSIS — G4730 Sleep apnea, unspecified: Secondary | ICD-10-CM | POA: Diagnosis not present

## 2024-01-26 ENCOUNTER — Encounter: Admitting: Physician Assistant

## 2024-01-27 ENCOUNTER — Ambulatory Visit: Admitting: Dermatology

## 2024-02-02 ENCOUNTER — Encounter: Payer: Self-pay | Admitting: *Deleted

## 2024-02-02 NOTE — Progress Notes (Signed)
 [name]                                          MRN: [id number]   02/02/2024   The [facility] Quality Team Specialist reviewed this patient medical record for the purposes of chart review for care gap closure. The following were reviewed: chart review for care gap closure-kidney health evaluation for diabetes:eGFR  and uACR.  No uACR completed to close KED gap  [facility] Quality Team

## 2024-02-10 ENCOUNTER — Encounter: Payer: Self-pay | Admitting: *Deleted

## 2024-02-10 ENCOUNTER — Ambulatory Visit (HOSPITAL_COMMUNITY): Admit: 2024-02-10 | Admitting: Plastic Surgery

## 2024-02-10 ENCOUNTER — Encounter (HOSPITAL_BASED_OUTPATIENT_CLINIC_OR_DEPARTMENT_OTHER): Payer: Self-pay

## 2024-02-10 SURGERY — EXCISION, CYST, BREAST
Anesthesia: Choice | Site: Breast | Laterality: Right

## 2024-02-10 NOTE — Progress Notes (Signed)
 KASI LASKY                                          MRN: [id number]   02/10/2024   The [facility] Quality Team Specialist reviewed this patient medical record for the purposes of chart review for care gap closure. The following were reviewed: chart review for care gap closure-kidney health evaluation for diabetes:eGFR  and uACR.    [facility] Quality Team

## 2024-02-19 ENCOUNTER — Encounter: Admitting: Surgical

## 2024-02-29 ENCOUNTER — Encounter: Payer: Self-pay | Admitting: Radiology

## 2024-03-01 ENCOUNTER — Encounter: Admitting: Surgical

## 2024-03-15 ENCOUNTER — Encounter: Admitting: Surgical

## 2024-03-29 NOTE — Progress Notes (Signed)
 [name]                                          MRN: [id number]   03/29/2024   The [facility] Quality Team Specialist reviewed this patient medical record for the purposes of chart review for care gap closure. The following were reviewed: chart review for care gap closure-kidney health evaluation for diabetes:eGFR  and uACR.    [facility] Quality Team

## 2024-03-30 ENCOUNTER — Ambulatory Visit: Admitting: Orthopaedic Surgery

## 2024-03-31 ENCOUNTER — Encounter (HOSPITAL_BASED_OUTPATIENT_CLINIC_OR_DEPARTMENT_OTHER): Payer: Self-pay | Admitting: Internal Medicine

## 2024-03-31 DIAGNOSIS — G4730 Sleep apnea, unspecified: Secondary | ICD-10-CM

## 2024-04-04 ENCOUNTER — Other Ambulatory Visit (HOSPITAL_COMMUNITY)
Admission: RE | Admit: 2024-04-04 | Discharge: 2024-04-04 | Disposition: A | Source: Ambulatory Visit | Attending: Family Medicine | Admitting: Family Medicine

## 2024-04-04 ENCOUNTER — Other Ambulatory Visit: Payer: Self-pay | Admitting: Family Medicine

## 2024-04-04 ENCOUNTER — Other Ambulatory Visit (HOSPITAL_COMMUNITY): Admit: 2024-04-04

## 2024-04-06 NOTE — Progress Notes (Signed)
 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 Report date is 03/14/2024

## 2024-04-07 ENCOUNTER — Other Ambulatory Visit: Payer: Self-pay

## 2024-04-07 ENCOUNTER — Emergency Department (HOSPITAL_COMMUNITY)

## 2024-04-07 ENCOUNTER — Emergency Department (HOSPITAL_COMMUNITY)
Admission: EM | Admit: 2024-04-07 | Discharge: 2024-04-07 | Disposition: A | Discharge status: Home / Self Care | Attending: Emergency Medicine | Admitting: Emergency Medicine

## 2024-04-07 ENCOUNTER — Encounter (HOSPITAL_COMMUNITY): Payer: Self-pay

## 2024-04-07 DIAGNOSIS — Z853 Personal history of malignant neoplasm of breast: Secondary | ICD-10-CM | POA: Insufficient documentation

## 2024-04-07 DIAGNOSIS — Z79899 Other long term (current) drug therapy: Secondary | ICD-10-CM | POA: Insufficient documentation

## 2024-04-07 DIAGNOSIS — I10 Essential (primary) hypertension: Secondary | ICD-10-CM | POA: Diagnosis not present

## 2024-04-07 DIAGNOSIS — R55 Syncope and collapse: Secondary | ICD-10-CM | POA: Insufficient documentation

## 2024-04-07 LAB — CBC
HCT: 42.9 % (ref 36.0–46.0)
Hemoglobin: 14.3 g/dL (ref 12.0–15.0)
MCH: 30.6 pg (ref 26.0–34.0)
MCHC: 33.3 g/dL (ref 30.0–36.0)
MCV: 91.9 fL (ref 80.0–100.0)
Platelets: 258 K/uL (ref 150–400)
RBC: 4.67 MIL/uL (ref 3.87–5.11)
RDW: 11.8 % (ref 11.5–15.5)
WBC: 7.5 K/uL (ref 4.0–10.5)
nRBC: 0 % (ref 0.0–0.2)

## 2024-04-07 LAB — TROPONIN I (HIGH SENSITIVITY)
Troponin I (High Sensitivity): 3 ng/L (ref <18)
Troponin I (High Sensitivity): <2 ng/L (ref <18)

## 2024-04-07 LAB — CBG MONITORING, ED: Glucose-Capillary: 114 mg/dL — ABNORMAL HIGH (ref 70–99)

## 2024-04-07 LAB — COMPREHENSIVE METABOLIC PANEL WITH GFR
ALT: 18 U/L (ref 0–44)
AST: 21 U/L (ref 15–41)
Albumin: 3.8 g/dL (ref 3.5–5.0)
Alkaline Phosphatase: 83 U/L (ref 38–126)
Anion gap: 9 (ref 5–15)
BUN: 12 mg/dL (ref 8–23)
CO2: 26 mmol/L (ref 22–32)
Calcium: 8.9 mg/dL (ref 8.9–10.3)
Chloride: 102 mmol/L (ref 98–111)
Creatinine, Ser: 0.98 mg/dL (ref 0.44–1.00)
GFR, Estimated: >60 mL/min (ref >60)
Glucose, Bld: 114 mg/dL — ABNORMAL HIGH (ref 70–99)
Potassium: 3.6 mmol/L (ref 3.5–5.1)
Sodium: 137 mmol/L (ref 135–145)
Total Bilirubin: 0.8 mg/dL (ref 0.0–1.2)
Total Protein: 7.1 g/dL (ref 6.5–8.1)

## 2024-04-07 LAB — CYTOLOGY - PAP: Diagnosis: NEGATIVE

## 2024-04-07 MED ORDER — LACTATED RINGERS IV BOLUS
1000.0000 mL | Freq: Once | INTRAVENOUS | Status: AC
  Administered 2024-04-07: 1000 mL via INTRAVENOUS

## 2024-04-07 NOTE — ED Provider Triage Note (Signed)
 Emergency Medicine Provider Triage Evaluation Note  [name] , a 73 y.o. female  was evaluated in triage.  Pt complains of near syncope.  Woke up this morning standing in bathroom became very lightheaded, fell back and hit head on rug, couldn't get up due to knee replacements in the past.  Still feels nauseous and lightheaded.  No CP.  No headache or neck pain.  No LOC. Not on blood thinners  Review of Systems  Positive: Lightheaded Negative: Chest pain, SOB, headache  Physical Exam  BP (!) 146/77 (BP Location: Left Arm)   Pulse 95   Temp 97.7 F (36.5 C) (Oral)   Resp 16   Ht 5' 4 (1.626 m)   Wt 88.5 kg   SpO2 99%   BMI 33.47 kg/m  Gen:   Awake, no distress   Resp:  Normal effort  MSK:   Moves extremities without difficulty    Medical Decision Making  Medically screening exam initiated at 8:21 AM.  Appropriate orders placed.  PARISS HOMMES was informed that the remainder of the evaluation will be completed by another provider, this initial triage assessment does not replace that evaluation, and the importance of remaining in the ED until their evaluation is complete.  Near syncope evaluation  No significant head trauma, not on a/c, no emergent indication for neuroimaging. Doubt SAH.   Cottie Donnice JINNY, MD 04/07/24 [phone]

## 2024-04-07 NOTE — ED Triage Notes (Signed)
 Patient BIB [facility] from due to near syncopal episode. Patient reports she went to the bathroom this am upon waking up and upon standing up from toilet she get dizzy and lightheaded. Patient fell backwards hitting on ground covered with bath mat. Patient states she recalls entire event. No noticeable injury upon arrival. Patient denies pain, just nausea. No blood thinner. VSS en route, A&Ox4. 4mg  zofran  given in route.

## 2024-04-07 NOTE — ED Provider Notes (Signed)
 [place] EMERGENCY DEPARTMENT AT [facility] Provider Note   CSN: [id number] Arrival date & time: 04/07/24  0700     Patient presents with: Near Syncope   [name] is a 73 y.o. female. Hx of anemia, breast cancer, hypothyroidism, HTN, HLD p/w near syncopal episode today.  History per patient.  Endorses that she went to the bathroom this morning upon waking up, was standing up from the toilet when she got dizzy and lightheaded.  States that she fell backwards, hitting her head on the ground where her bath mat was. Pt states that she recalls the entire episode, no LOC. Denies any injuries following the fall. Endorses nausea for several hours after the fall. Denies any blood thinners.  States that with EMS, she continued to feel nauseous, however did not have any episodes of vomiting.  No other falls.  She endorses that she was on the ground for approximately 1 hour, she was able to scoot over to her phone (unable to stand up secondary to prior history of bilateral knee replacements), and was able to call her friend.  Her friend's husband helped her up, and then called EMS.  Endorses that her nausea feels significantly improved at this time.  She states that she has not been drinking as much water  recently, and feels that she may be dehydrated.  She is actively eating and drinking throughout evaluation.  Denies any pain at this time.  Does note that she is currently being evaluated with her PCP and OB/GYN for intermittent bleeding in a postmenopausal patient, has an appointment with her OB/GYN and sonography to further evaluate.  Denies pain or burning with urination.  Denies active bleeding, denies discharge.    Near Syncope       Prior to Admission medications  Medication Sig Start Date End Date Taking? Authorizing Provider  atorvastatin  (LIPITOR) 10 MG tablet Take by mouth. 12/26/21   [provider]  doxazosin (CARDURA) 2 MG tablet Take 2 mg by mouth daily.     Benjamine Aland, MD  HYDROcodone -acetaminophen  (NORCO/VICODIN) 5-325 MG tablet Take 1 tablet by mouth 3 (three) times daily as needed for moderate pain (pain score 4-6). To be taken after surgery 07/21/23   Stanbery, Mary L, PA-C  JARDIANCE 25 MG TABS tablet  12/09/21   [provider]  Multiple Vitamin (MULTIVITAMIN WITH MINERALS) TABS tablet Take 1 tablet by mouth daily.    [provider]  ondansetron  (ZOFRAN ) 4 MG tablet Take 1 tablet (4 mg total) by mouth every 8 (eight) hours as needed for nausea or vomiting. 07/21/23   Jule Ronal CROME, PA-C  potassium chloride  (KLOR-CON ) 10 MEQ tablet Take 10 mEq by mouth 3 (three) times daily.    [provider]  Safety Seal Miscellaneous MISC Melaxemic Cream with tranexamic acid 5% kojic acid usp 2% vit c USP 2.5% hyaluronic acid excp 0.1% - apply to face twice daily. 09/22/23   Alm Delon SAILOR, DO  spironolactone (ALDACTONE) 100 MG tablet Take 100 mg by mouth daily.    [provider]  SYNTHROID  100 MCG tablet Take 100 mcg by mouth See admin instructions. Take 100 mcg by mouth before breakfast Monday-Thursday 04/29/18   [provider]  SYNTHROID  88 MCG tablet Take 88 mcg by mouth See admin instructions. Take 88 mcg by mouth Friday, Saturday and Sunday 07/15/18   [provider]  Vitamin D , Ergocalciferol , (DRISDOL) 1.25 MG (50000 UNIT) CAPS capsule Take 50,000 Units by mouth every 7 (  seven) days.    Benjamine Aland, MD    Allergies: Patient has no known allergies.    Review of Systems  Cardiovascular:  Positive for near-syncope.    Updated Vital Signs BP (!) 158/88   Pulse 99   Temp 98.2 F (36.8 C)   Resp 16   Ht 5' 4 (1.626 m)   Wt 88.5 kg   SpO2 97%   BMI 33.47 kg/m   Physical Exam Vitals and nursing note reviewed.  Constitutional:      General: She is not in acute distress.    Appearance: She is well-developed.  HENT:     Head: Normocephalic and atraumatic.     Mouth/Throat:     Mouth:  Mucous membranes are moist.     Pharynx: Oropharynx is clear.  Eyes:     Conjunctiva/sclera: Conjunctivae normal.  Cardiovascular:     Rate and Rhythm: Normal rate and regular rhythm.     Pulses: Normal pulses.     Heart sounds: Normal heart sounds. No murmur heard. Pulmonary:     Effort: Pulmonary effort is normal. No respiratory distress.     Breath sounds: Normal breath sounds. No stridor. No wheezing, rhonchi or rales.  Abdominal:     General: Abdomen is flat. There is no distension.     Palpations: Abdomen is soft.     Tenderness: There is no abdominal tenderness. There is no right CVA tenderness, left CVA tenderness, guarding or rebound.  Musculoskeletal:        General: No swelling, tenderness, deformity or signs of injury. Normal range of motion.     Cervical back: Normal range of motion and neck supple. No rigidity.     Right lower leg: No edema.     Left lower leg: No edema.  Skin:    General: Skin is warm and dry.     Capillary Refill: Capillary refill takes less than 2 seconds.  Neurological:     General: No focal deficit present.     Mental Status: She is alert and oriented to person, place, and time. Mental status is at baseline.     Cranial Nerves: No cranial nerve deficit.     Sensory: No sensory deficit.     Motor: No weakness.     Gait: Gait normal.  Psychiatric:        Mood and Affect: Mood normal.     (all labs ordered are listed, but only abnormal results are displayed) Labs Reviewed  COMPREHENSIVE METABOLIC PANEL WITH GFR - Abnormal; Notable for the following components:      Result Value   Glucose, Bld 114 (*)    All other components within normal limits  CBG MONITORING, ED - Abnormal; Notable for the following components:   Glucose-Capillary 114 (*)    All other components within normal limits  CBC  TROPONIN I (HIGH SENSITIVITY)  TROPONIN I (HIGH SENSITIVITY)    EKG: EKG Interpretation Date/Time:  Thursday April 07 2024 08:28:55  EST Ventricular Rate:  93 PR Interval:  184 QRS Duration:  66 QT Interval:  366 QTC Calculation: 455 R Axis:   54  Text Interpretation: Normal sinus rhythm When compared with ECG of 21-Feb-2019 11:45, PREVIOUS ECG IS PRESENT Confirmed by Cottie Cough [phone]) on 04/07/2024 8:30:31 AM  Radiology: CT Head Wo Contrast Result Date: 04/07/2024 CLINICAL DATA:  Near syncope EXAM: CT HEAD WITHOUT CONTRAST TECHNIQUE: Contiguous axial images were obtained from the base of the skull through the vertex without intravenous contrast.  RADIATION DOSE REDUCTION: This exam was performed according to the departmental dose-optimization program which includes automated exposure control, adjustment of the mA and/or kV according to patient size and/or use of iterative reconstruction technique. COMPARISON:  CT brain 11/28/2011 FINDINGS: Brain: No acute territorial infarction, hemorrhage or intracranial mass. The ventricles are nonenlarged. Vascular: No hyperdense vessels.  Carotid vascular calcification Skull: Normal. Negative for fracture or focal lesion. Sinuses/Orbits: No acute finding. Other: None IMPRESSION: No CT evidence for acute intracranial abnormality Electronically Signed   By: Luke Bun M.D.   On: 04/07/2024 17:18   CT Cervical Spine Wo Contrast Result Date: 04/07/2024 CLINICAL DATA:  Syncopal episode EXAM: CT CERVICAL SPINE WITHOUT CONTRAST TECHNIQUE: Multidetector CT imaging of the cervical spine was performed without intravenous contrast. Multiplanar CT image reconstructions were also generated. RADIATION DOSE REDUCTION: This exam was performed according to the departmental dose-optimization program which includes automated exposure control, adjustment of the mA and/or kV according to patient size and/or use of iterative reconstruction technique. COMPARISON:  Radiograph 10/06/2018 FINDINGS: Alignment: Straightening of the cervical spine. No subluxation. Facet alignment is within normal limits. Skull  base and vertebrae: Vertebral body heights are maintained. No definitive fracture is seen. Chronic irregularity at the posterior arch of C1 Soft tissues and spinal canal: No prevertebral fluid or swelling. No visible canal hematoma. Disc levels: Anterior fusion hardware C4 through C7 with solid bone fusion evident. Moderate severe disc space narrowing and degenerative change C3-C4 and C7-T1. No high-grade bony canal stenosis. Multilevel facet degenerative changes. Mild bilateral foraminal narrowing at C3 C4 and C7 T1. Upper chest: Lung apices are clear. Other: None IMPRESSION: Straightening of the cervical spine with anterior fusion C4 through C7. Intact appearing hardware. No definitive fracture is seen. Moderate advanced disc space narrowing and degenerative change at adjacent C3-C4 and C7-T1. Electronically Signed   By: Luke Bun M.D.   On: 04/07/2024 17:15   DG Chest 2 View Result Date: 04/07/2024 CLINICAL DATA:  Near syncope. EXAM: CHEST - 2 VIEW COMPARISON:  09/09/2012 FINDINGS: The lungs are clear without focal pneumonia, edema, pneumothorax or pleural effusion. The cardiopericardial silhouette is within normal limits for size. No acute bony abnormality. IMPRESSION: No active cardiopulmonary disease. Electronically Signed   By: Camellia Candle M.D.   On: 04/07/2024 09:19     Procedures   Medications Ordered in the ED  lactated ringers  bolus 1,000 mL (0 mLs Intravenous Stopped 04/07/24 1953)    Clinical Course as of 04/07/24 2349  Thu Apr 07, 2024  1909 Patient hypotensive with orthostatics, systolics dropped from 140s to 90s.  Will provide fluids at this time. [BS]    Clinical Course User Index [BS] Arlee Katz, MD                                 Medical Decision Making Amount and/or Complexity of Data Reviewed Labs: ordered. Radiology: ordered.   Based on patient presentation, history, evaluation, high suspicion for vasovagal syncope in the setting of mild dehydration  without evidence of AKI.  Workup overall primary reassuring, low suspicion for intracranial bleed versus C-spine injury versus pneumothorax versus ACS versus pneumonia versus pneumonitis versus hypoglycemia versus electrolyte derangement versus anemia causing patient episode today.  Patient had just used the bathroom when developing the symptoms, however concerned that patient developed vasovagal syncope in association with the symptoms today.  Denies any pain or burning with urination, hemodynamically stable, low suspicion for  UTI.  Improvement in hemodynamic status, orthostatics significantly improved on repeat evaluation after receiving fluids.  Recommended patient to purchase compression stockings, to improve BP.  Also recommended hydrated patient to drink lots of water , and to sit up and stand very slowly secondary to oncoming symptoms.  Overall with very reassuring workup today, asymptomatic at this time, and more reassuring orthostatics, patient is overall stable for discharge.  Recommend strict precautions to the ED.  Recommend follow-up with PCP in 3 to 4 days.     Final diagnoses:  Near syncope  Vasovagal syncope    ED Discharge Orders     None          Arlee Katz, MD 04/07/24 2349    Ellouise Fine K, DO 04/08/24 1503

## 2024-05-01 ENCOUNTER — Ambulatory Visit (HOSPITAL_BASED_OUTPATIENT_CLINIC_OR_DEPARTMENT_OTHER): Attending: Family Medicine | Admitting: Pulmonary Disease

## 2024-05-01 DIAGNOSIS — G4733 Obstructive sleep apnea (adult) (pediatric): Secondary | ICD-10-CM | POA: Insufficient documentation

## 2024-05-01 DIAGNOSIS — G4700 Insomnia, unspecified: Secondary | ICD-10-CM

## 2024-05-01 DIAGNOSIS — G4730 Sleep apnea, unspecified: Secondary | ICD-10-CM | POA: Diagnosis present

## 2024-05-07 DIAGNOSIS — G4733 Obstructive sleep apnea (adult) (pediatric): Secondary | ICD-10-CM

## 2024-05-07 NOTE — Procedures (Signed)
 " Indications for Polysomnography The patient is a 74 year old Female who is 5' 4 and weighs 195.0 lbs.  Her BMI equals 33.7.  A diagnostic polysomnogram was performed to evaluate for -.  After 135.0 minutes of sleep time the patient exhibited sufficient respiratory events qualifying  her for a CPAP trial which was then initiated.  MedicationNo Data. Polysomnogram Data A full night polysomnogram was performed recording the standard physiologic parameters including EEG, EOG, EMG, EKG, nasal and oral airflow.  Respiratory parameters of chest and abdominal movements are recorded with [place]Crystal motion transducers.   Oxygen saturation was recorded by pulse oximetry.  Sleep Architecture The total recording time of the diagnostic portion of the study was 184.0 minutes.  The total sleep time was 135.0 minutes.  During the diagnostic portion of the study, the patient spent 17.4% of total sleep time in Stage N1, 45.6% in Stage N2, 35.9% in  Stages N3, and 1.1% in REM.   Sleep latency was 4.0 minutes.  REM latency was 135.5 minutes.  Sleep Efficiency was 73.4%.  Wake after Sleep Onset time was 45.0 minutes.  At 02:17:10 AM the patient was placed on PAP treatment and was titrated at pressures ranging from 5* cm/H20 with supplemental oxygen at - up to 10* cm/H20 with supplemental oxygen at -.  The total recording time of the treatment portion of the study was  189.3 minutes.  The total sleep time was 156.0 minutes.  During the treatment portion of the study, the patient spent 11.2% of total sleep time in Stage N1, 60.6% in Stage N2, 20.5% in Stages N3, and 7.7% in REM.   Sleep latency was 0.0 minutes.  REM  latency was 14.5 minutes.  Sleep Efficiency was 82.4%.  Wake after Sleep Onset time was 33.0 minutes.  Respiratory Events During the diagnostic portion of the study, the polysomnogram revealed a presence of - obstructive, 1 central, and - mixed apneas resulting in an Apnea index of 0.4 events per hour.   There were 48 hypopneas (GreaterEqual to3% desaturation and/or arousal)  resulting in an Apnea\Hypopnea Index (AHI GreaterEqual to3% desaturation and/or arousal) of 21.8 events per hour.  There were 23 hypopneas (GreaterEqual to4% desaturation) resulting in an Apnea\Hypopnea Index (AHI GreaterEqual to4% desaturation) of 10.7  events per hour.  There were 5 Respiratory Effort Related Arousals resulting in a RERA index of 2.2 events per hour. The Respiratory Disturbance Index is 24.0 events per hour.  The snore index was 0.9 events per hour.  Mean oxygen saturation was 90.3%.   The lowest oxygen saturation during sleep was 79.0%.  Time spent LessEqual to88% oxygen saturation was  minutes ().  During the treatment portion of the study, the polysomnogram revealed a presence of - obstructive, 2 central, and - mixed apneas resulting in an Apnea index of 0.8 events per hour.  There were 24 hypopneas (GreaterEqual to3% desaturation and/or arousal)  resulting in an Apnea\Hypopnea Index (AHI GreaterEqual to3% desaturation and/or arousal) of 10.0 events per hour.  There were 10 hypopneas (GreaterEqual to4% desaturation) resulting in an Apnea\Hypopnea Index (AHI GreaterEqual to4% desaturation) of 4.6  events per hour.  There were 2 Respiratory Effort Related Arousals resulting in a RERA index of 0.8 events per hour. The Respiratory Disturbance Index is 10.8 events per hour.  The snore index was 0.8 events per hour.  Mean oxygen saturation was 92.9%.   The lowest oxygen saturation during sleep was 87.0%.  Time spent LessEqual to88% oxygen saturation was  minutes ().  Limb Activity During the diagnostic portion of the study, there were 6 limb movements recorded.  Of this total, 4 were classified as PLMs.  Of the PLMs, - were associated with arousals.  The Limb Movement index was 2.7 per hour while the PLM index was 1.8 per hour.  During the treatment portion of the study, there were 36 limb movements recorded.  Of  this total, 18 were classified as PLMs.  Of the PLMs, 5 were associated with arousals.  The Limb Movement index was 13.8 per hour while the PLM index was 6.9 per hour.  Cardiac Summary During the diagnostic portion of the study, the average pulse rate was 79.8 bpm.  The minimum pulse rate was 66.0 bpm while the maximum pulse rate was 93.0 bpm.  During the treatment portion of the study, the average pulse rate was 71.3 bpm.  The minimum pulse rate was 58.0 bpm while the maximum pulse rate was 86.0 bpm.  Comments: Patient had a sleep night study performed, tolerated titration well  Diagnosis: Moderate obstructive sleep apnea with moderate oxygen desaturations AHI of 21.8 with O2 nadir of 79% Sleep efficiency improved during titration, REM sleep improved during titration, no significant oxygen desaturations at optimal pressures No significant periodic limb movement Cardiac rhythm was sinus  Recommendations: CPAP of 10 with EPR of 2 with heated humidification Patient used a small wide cushion ResMed N30 nasal cushion/cradle mask Avoid alcohol, sedatives and other CNS depressants that may worsen sleep apnea and disrupt normal sleep architecture. Sleep hygiene should be reviewed to assess factors that may improve sleep quality. Weight management and regular exercise should be initiated or continued  Follow-up 4 to 6 weeks following initiation of CPAP therapy  This study was personally reviewed and electronically signed by: Neda Hammond Accredited Board Certified in Sleep Medicine Date/Time:  05/07/24 "

## 2024-05-07 NOTE — Procedures (Signed)
 Darryle Law [facility] [address] [place], [place] [address] Tel: [phone]   Fax: [phone]  Split Night Interpretation  Patient Name:  [name], [name] Date:  05/01/2024 Referring Physician:  KENNIETH LEECH [phone]) %%startinterp%% Indications for Polysomnography The patient is a 74 year old Female who is 5' 4 and weighs 195.0 lbs.  Her BMI equals 33.7.  A diagnostic polysomnogram was performed to evaluate for -.  After 135.0 minutes of sleep time the patient exhibited sufficient respiratory events qualifying her for a CPAP trial which was then initiated.    Medication  No Data.   Polysomnogram Data A full night polysomnogram was performed recording the standard physiologic parameters including EEG, EOG, EMG, EKG, nasal and oral airflow.  Respiratory parameters of chest and abdominal movements are recorded with [place] motion transducers.  Oxygen saturation was recorded by pulse oximetry.    Sleep Architecture The total recording time of the diagnostic portion of the study was 184.0 minutes.  The total sleep time was 135.0 minutes.  During the diagnostic portion of the study, the patient spent 17.4% of total sleep time in Stage N1, 45.6% in Stage N2, 35.9% in Stages N3, and 1.1% in REM.   Sleep latency was 4.0 minutes.  REM latency was 135.5 minutes.  Sleep Efficiency was 73.4%.  Wake after Sleep Onset time was 45.0 minutes.   At 02:17:10 AM the patient was placed on PAP treatment and was titrated at pressures ranging from 5* cm/H20 with supplemental oxygen at - up to 10* cm/H20 with supplemental oxygen at -.  The total recording time of the treatment portion of the study was 189.3 minutes.  The total sleep time was 156.0 minutes.  During the treatment portion of the study, the patient spent 11.2% of total sleep time in Stage N1, 60.6% in Stage N2, 20.5% in Stages N3, and 7.7% in REM.   Sleep latency was 0.0 minutes.  REM latency was 14.5 minutes.  Sleep  Efficiency was 82.4%.  Wake after Sleep Onset time was 33.0 minutes.  Respiratory Events During the diagnostic portion of the study, the polysomnogram revealed a presence of - obstructive, 1 central, and - mixed apneas resulting in an Apnea index of 0.4 events per hour.  There were 48 hypopneas (>=3% desaturation and/or arousal) resulting in an Apnea\Hypopnea Index (AHI >=3% desaturation and/or arousal) of 21.8 events per hour.  There were 23 hypopneas (>=4% desaturation) resulting in an Apnea\Hypopnea Index (AHI >=4% desaturation) of 10.7 events per hour.  There were 5 Respiratory Effort Related Arousals resulting in a RERA index of 2.2 events per hour. The Respiratory Disturbance Index is 24.0 events per hour.  The snore index was 0.9 events per hour.  Mean oxygen saturation was 90.3%.  The lowest oxygen saturation during sleep was 79.0%.  Time spent <=88% oxygen saturation was 30.4 minutes (16.5%).  During the treatment portion of the study, the polysomnogram revealed a presence of - obstructive, 2 central, and - mixed apneas resulting in an Apnea index of 0.8 events per hour.  There were 24 hypopneas (>=3% desaturation and/or arousal) resulting in an Apnea\Hypopnea Index (AHI >=3% desaturation and/or arousal) of 10.0 events per hour.  There were 10 hypopneas (>=4% desaturation) resulting in an Apnea\Hypopnea Index (AHI >=4% desaturation) of 4.6 events per hour.  There were 2 Respiratory Effort Related Arousals resulting in a RERA index of 0.8 events per hour. The Respiratory Disturbance Index is 10.8 events per hour.  The snore index was  0.8 events per hour.  Mean oxygen saturation was 92.9%.  The lowest oxygen saturation during sleep was 87.0%.  Time spent <=88% oxygen saturation was 0.3 minutes (0.2%).  Limb Activity During the diagnostic portion of the study, there were 6 limb movements recorded.  Of this total, 4 were classified as PLMs.  Of the PLMs, - were associated with arousals.  The Limb  Movement index was 2.7 per hour while the PLM index was 1.8 per hour.  During the treatment portion of the study, there were 36 limb movements recorded.  Of this total, 18 were classified as PLMs.  Of the PLMs, 5 were associated with arousals.  The Limb Movement index was 13.8 per hour while the PLM index was 6.9 per hour.  Cardiac Summary During the diagnostic portion of the study, the average pulse rate was 79.8 bpm.  The minimum pulse rate was 66.0 bpm while the maximum pulse rate was 93.0 bpm.  During the treatment portion of the study, the average pulse rate was 71.3 bpm.  The minimum pulse rate was 58.0 bpm while the maximum pulse rate was 86.0 bpm.   Comments: Patient had a sleep night study performed, tolerated titration well  Diagnosis:  Moderate obstructive sleep apnea with moderate oxygen desaturations AHI of 21.8 with O2 nadir of 79% Sleep efficiency improved during titration, REM sleep improved during titration, no significant oxygen desaturations at optimal pressures No significant periodic limb movement Cardiac rhythm was sinus  Recommendations: CPAP of 10 with EPR of 2 with heated humidification Patient used a small wide cushion ResMed N30 nasal cushion/cradle mask Avoid alcohol, sedatives and other CNS depressants that may worsen sleep apnea and disrupt normal sleep architecture. Sleep hygiene should be reviewed to assess factors that may improve sleep quality. Weight management and regular exercise should be initiated or continued  Follow-up 4 to 6 weeks following initiation of CPAP therapy  This study was personally reviewed and electronically signed by: Neda Hammond Accredited Board Certified in Sleep Medicine Date/Time:   05/07/24    Split Night Report  Patient Name: [name], [name] Study Date: 05/01/2024  Date of Birth: [birth year] Study Type: Split Night  Age: 74 year MRN #: [id number]  Sex: Female Interpreting Physician: NEDA HAMMOND, [id number]   Height: 5' 4 Referring Physician: KENNIETH LEECH [phone])  Weight: 195.0 lbs Recording Tech: Holly Neeriemer RPSGT RST  BMI: 33.7 Scoring Tech: Holly Neeriemer RPSGT RST  ESS: 5 Neck Size: 15.75  Mask Type ResMed N30i nasal cushion/cradle Final Pressure: 10  Mask Size: Small-wide cushion Supplemental O2: N/A   Study Overview  DIAGNOSTIC TREATMENT  Lights Off: 11:12:55 PM Lights Off: 02:16:55 AM  Lights On: 02:16:55 AM Lights On: 05:26:11 AM  Time in Bed: 184.0 min. Time in Bed: 189.3 min.  Total Sleep Time: 135.0 min. Total Sleep Time: 156.0 min.  Sleep Efficiency: 73.4% Sleep Efficiency: 82.4%  Sleep Latency: 4.0 min. Sleep Latency: 0.0 min.  REM Latency from Sleep Onset: 135.5 min. REM Latency from Sleep Onset: 14.5 min.  Wake After Sleep Onset: 45.0 min. Wake After Sleep Onset: 33.0 min.   DIAGNOSTIC TREATMENT   Count Index  Count Index  Awakenings: 20 8.9 Awakenings: 16 6.2  Arousals: 50 22.2 Arousals: 61 23.5  AHI (>=3% Desat and/or Ar.): 49 21.8 AHI (>=3% Desat and/or Ar.): 26 10.0  AHI (>=4% Desat): 24 10.7 AHI (>=4% Desat): 12 4.6   Limb Movements: 6 2.7 Limb Movements: 36 13.8  Snore: 2 0.9 Snore: 2 0.8  Desaturations:  52 23.1 Desaturations: 31 11.9  Minimum SpO2 TST: 79.0% Minimum SpO2 TST: 87.0%    Sleep Architecture   DIAGNOSTIC TREATMENT ENTIRE NIGHT  Stages Time (mins) % Sleep Time Time (mins) % Sleep Time Time (mins) % Sleep Time  Wake 49.5  33.5  83.0   Stage N1 23.5 17.4% 17.5 11.2% 41.0 14.1%  Stage N2 61.5 45.6% 94.5 60.6% 156.0 53.6%  Stage N3 48.5 35.9% 32.0 20.5% 80.5 27.7%  REM 1.5 1.1% 12.0 7.7% 13.5 4.6%   Arousal Summary   DIAGNOSTIC TREATMENT   NREM REM TST Index NREM REM TST Index  Respiratory Ar. 12 1 13  5.8 4 - 4 1.5  PLM Ar. - - - - 5 - 5 1.9  Isolated Limb Movement Ar. 2 - 2 0.9 18 - 18 6.9  Snore Ar. - - - - - - - -  Spontaneous Ar. 35 - 35 15.6 33 1 34 13.1  Total Ar. 49 1 50 22.2 60 1 61 23.5    Respiratory Summary  DIAGNOSTIC By  Sleep Stage By Body Position Total   NREM REM Supine Non-Supine   Time (min) 133.5 1.5 135.0 - 135.0         Obstructive Apnea - - - - -  Mixed Apnea - - - - -  Central Apnea 1 - 1 - 1  Total Apneas 1 - 1 - 1  Total Apnea Index 0.4 - 0.4 - 0.4         Hypopneas (>=3% Desat and/or Ar.) 46 2 48 - 48  AHI (>=3% Desat and/or Ar.) 21.1 80.0 21.8 - 21.8         Hypopneas (>=4% Desat) 21 2 23  - 23  AHI (>=4% Desat) 9.9 80.0 10.7 - 10.7          RERAs 5 - 5 - 5  RERA Index 2.2 - 2.2 - 2.2         RDI 23.4 80.0 24.0 - 24.0    Respiratory Event Type Index  Central Apneas 0.4  Obstructive Apneas -  Mixed Apneas -  Central Hypopneas 0.4  Obstructive Hypopneas 20.9  Central Apnea + Hypopnea (CAHI) 0.9  Obstructive Apnea + Hypopnea (OAHI) 20.9    TREATMENT By Sleep Stage By Body Position Total   NREM REM Supine Non-Supine   Time (min) 144.0 12.0 71.0 85.0 156.0         Obstructive Apnea - - - - -  Mixed Apnea - - - - -  Central Apnea 2 - 1 1 2   Total Apneas 2 - 1 1 2   Total Apnea Index 0.8 - 0.8 0.7 0.8         Hypopneas (>=3% Desat and/or Ar.) 24 - 6 18 24   AHI (>=3% Desat and/or Ar.) 10.8 - 5.9 13.4 10.0         Hypopneas (>=4% Desat) 10 - - 10 10  AHI (>=4% Desat) 5.0 - 0.8 7.8 4.6          RERAs 2 - 2 - 2  RERA Index 0.8 - 1.7 - 0.8         RDI 11.7 - 7.6 13.4 10.8    Respiratory Event Type Index  Central Apneas 0.8  Obstructive Apneas -  Mixed Apneas -  Central Hypopneas -  Obstructive Hypopneas 9.2  Central Apnea + Hypopnea (CAHI) 0.8  Obstructive Apnea + Hypopnea (OAHI) 9.2    Respiratory Event Durations   DIAGNOSTIC TREATMENT  Apnea NREM  REM NREM REM  Average (seconds) 15.8 - 15.3 -  Maximum (seconds) 15.8 - 16.6 -  Hypopnea      Average (seconds) 18.8 18.3 24.4 -  Maximum (seconds) 26.4 20.1 33.5 -    Limb Movement Summary   DIAGNOSTIC TREATMENT   Count Index Count Index  Isolated Limb Movements 2 0.9 18 6.9  Periodic Limb Movements (PLMs) 4  1.8 18 6.9  Total Limb Movements 6 2.7 36 13.8    Oxygen Saturation Summary   DIAGNOSTIC TREATMENT   Wake NREM REM TST Wake NREM REM TST  Average SpO2 92.1% 89.6% 90.8% 89.6% 94.6% 92.5% 92.3% 92.5%  Minimum SpO2 86.0% 79.0% 87.0% 79.0%  89.0% 87.0% 91.0% 87.0%   Maximum SpO2 98.0% 95.0% 93.0% 95.0%  98.0% 98.0% 96.0% 98.0%    DIAGNOSTIC Oxygen Saturation Distribution  Range (%) Time in range (min) Time in range (%)   90.0 - 100.0 76.4 41.5%  80.0 - 90.0 107.8 58.5%  70.0 - 80.0 0.1 0.0%  60.0 - 70.0 - -  50.0 - 60.0 - -  0.0 - 50.0 - -  Time Spent <=88% SpO2  Range (%) Time in range (min) Time in range (%)  0.0 - 88.0 30.4 16.5%      Count Index  Desaturations: 52 23.1   TREATMENT Oxygen Saturation Distribution  Range (%) Time in range (min) Time in range (%)   90.0 - 100.0 179.2 95.3%  80.0 - 90.0 8.9 4.7%  70.0 - 80.0 - -  60.0 - 70.0 - -  50.0 - 60.0 - -  0.0 - 50.0 - -  Time Spent <=88% SpO2  Range (%) Time in range (min) Time in range (%)  0.0 - 88.0 0.3 0.2%      Count Index  Desaturations: 31 11.9    Cardiac Summary   DIAGNOSTIC TREATMENT   Wake NREM REM Total Wake NREM REM Total  Average Pulse Rate (BPM) 81.7 79.1 76.1 79.8 73.2 70.9 70.4 71.3  Minimum Pulse Rate (BPM) 71.0 68.0 66.0 66.0 62.0 58.0 59.0 58.0  Maximum Pulse Rate (BPM) 93.0 92.0 84.0 93.0 86.0 85.0 80.0 86.0   Pulse Rate Distribution   DIAGNOSTIC  Range (bpm) Time in range (min) Time in range (%)  0.0 - 40.0 - -  40.0 - 60.0 - -  60.0 - 80.0 109.4 59.4%  80.0 - 100.0 74.8 40.6%  100.0 - 120.0 - -  120.0 - 140.0 - -  140.0 - 200.0 - -   TREATMENT  Range (bpm) Time in range (min) Time in range (%)  0.0 - 40.0 - -  40.0 - 60.0 0.3 0.2%  60.0 - 80.0 184.8 97.7%  80.0 - 100.0 4.1 2.2%  100.0 - 120.0 - -  120.0 - 140.0 - -  140.0 - 200.0 - -    Titration Summary  PAP Device PAP Level O2 Level Time (min) Wake (min) NREM (min) REM (min) Supine TST (min) Sleep Eff%  OA# CA# MA# Hyp# (>=3%) AHI (>=3%) Hyp# (>=4%) AHI (>=%4) RERA RDI SpO2 <=88% (min) Min SpO2 Mean SpO2 Ar. Index  - Off - 184.5 49.5 133.5 1.5  135.0 73.2% - 1 - 48 21.8 23  10.7 5  24.0  29.8 79.0 89.6 22.2  CPAP 5 - 10.5 0.0 10.5 0.0  100.0% - - - 3 17.1 1  5.7 -  17.1  0.2 88.0 90.5 -  CPAP 6 - 20.0 1.5 10.0 8.5  92.5% - - - 2  6.5 2  6.5 -  6.5  0.0 90.0 92.0 -  CPAP 7 - 40.0 1.5 35.0 3.5  96.3% - 1 - 10 17.1 5  9.4 -  17.1  0.1 87.0 92.3 20.3  CPAP 8 - 72.5 15.0 57.5 0.0  40.0 79.3% - - - 5 5.2 2  2.1 -  5.2  0.0 90.0 92.4 21.9  CPAP 9 - 24.0 7.0 17.0 0.0  17.0 70.8% - 1 - 3 14.1 -  3.5 -  14.1  0.0 89.0 93.6 49.4  CPAP 10 - 22.5 8.5 14.0 0.0  14.0 62.2% - - - 2 8.6 -  - 2  17.1  0.0 93.0 94.2 55.7    Hypnograms                           Technologist Comments  The 74 y/o female was seen in the [facility] for a Split Night NPSG. The patient's associated diagnosis is sleep apnea. The patient has CPAP at home but does not use it currently.  The study was performed in sleep room #1  The patient arrived without complaint. The Split study process was explained and she was fitted for a mask prior to study start. She was fitted with a ResMed N30i nasal cushion/cradle mask, SW cushion, Standard Frame.   The patient was observed sleeping supine and lateral. Snoring was mild and intermittent. Apneic and hypopneic events were observed during sleep and the patient did meet split night criteria. The patient had a long period of WASO at the beginning and end of the study. At the beginning, it appeared hypopneas were occuring in N1 sleep causing arousals. Toward the end of the study PLM arousals were more frequent.  The study was split and CPAP was applied at 5 cm/H2O. Highest pressure reached was 10 cm/H2O, EPR: 2.  Unsure of optimal pressure due to the amount of WASO.  In the morning, the patient stated she does not know why but she has a tendancy to wake up right before her alarm or  before she has to get up to go somewhere etc. She thought she was waking up this morning for that reason.   ECG: Appeared to be Sinus Rhythm via two lead monitoring No supplemental O2 was applied No restroom visits occurred during the study. PLMAs and PLMs were occasional and more frequent late in the study on CPAP. No parasomnias were observed.

## 2024-05-10 ENCOUNTER — Ambulatory Visit: Admitting: Orthopaedic Surgery

## 2024-05-11 ENCOUNTER — Ambulatory Visit: Admitting: Orthopaedic Surgery

## 2024-05-31 ENCOUNTER — Institutional Professional Consult (permissible substitution): Payer: Self-pay | Admitting: Plastic Surgery

## 2024-06-21 ENCOUNTER — Ambulatory Visit: Admitting: Dermatology
